# Patient Record
Sex: Female | Born: 1950 | Race: White | Hispanic: No | Marital: Single | Smoking: Former smoker
Health system: Southern US, Community
[De-identification: ages and names within clinical notes are randomized; demographics above are authoritative.]

## PROBLEM LIST (undated history)

## (undated) DIAGNOSIS — E785 Hyperlipidemia, unspecified: Secondary | ICD-10-CM

## (undated) DIAGNOSIS — Z8711 Personal history of peptic ulcer disease: Secondary | ICD-10-CM

## (undated) DIAGNOSIS — E0590 Thyrotoxicosis, unspecified without thyrotoxic crisis or storm: Secondary | ICD-10-CM

## (undated) DIAGNOSIS — K648 Other hemorrhoids: Secondary | ICD-10-CM

## (undated) DIAGNOSIS — H9192 Unspecified hearing loss, left ear: Secondary | ICD-10-CM

## (undated) DIAGNOSIS — R252 Cramp and spasm: Secondary | ICD-10-CM

## (undated) DIAGNOSIS — F32A Depression, unspecified: Secondary | ICD-10-CM

## (undated) DIAGNOSIS — J42 Unspecified chronic bronchitis: Secondary | ICD-10-CM

## (undated) DIAGNOSIS — Z9109 Other allergy status, other than to drugs and biological substances: Secondary | ICD-10-CM

## (undated) DIAGNOSIS — K759 Inflammatory liver disease, unspecified: Secondary | ICD-10-CM

## (undated) DIAGNOSIS — E7800 Pure hypercholesterolemia, unspecified: Secondary | ICD-10-CM

## (undated) DIAGNOSIS — Z8719 Personal history of other diseases of the digestive system: Secondary | ICD-10-CM

## (undated) DIAGNOSIS — K432 Incisional hernia without obstruction or gangrene: Secondary | ICD-10-CM

## (undated) DIAGNOSIS — M8580 Other specified disorders of bone density and structure, unspecified site: Secondary | ICD-10-CM

## (undated) DIAGNOSIS — K219 Gastro-esophageal reflux disease without esophagitis: Secondary | ICD-10-CM

## (undated) DIAGNOSIS — IMO0002 Reserved for concepts with insufficient information to code with codable children: Secondary | ICD-10-CM

## (undated) DIAGNOSIS — Z8619 Personal history of other infectious and parasitic diseases: Secondary | ICD-10-CM

## (undated) DIAGNOSIS — F988 Other specified behavioral and emotional disorders with onset usually occurring in childhood and adolescence: Secondary | ICD-10-CM

## (undated) DIAGNOSIS — N393 Stress incontinence (female) (male): Secondary | ICD-10-CM

## (undated) DIAGNOSIS — F329 Major depressive disorder, single episode, unspecified: Secondary | ICD-10-CM

## (undated) DIAGNOSIS — M1990 Unspecified osteoarthritis, unspecified site: Secondary | ICD-10-CM

## (undated) DIAGNOSIS — J45998 Other asthma: Secondary | ICD-10-CM

## (undated) HISTORY — DX: Other allergy status, other than to drugs and biological substances: Z91.09

## (undated) HISTORY — DX: Incisional hernia without obstruction or gangrene: K43.2

## (undated) HISTORY — DX: Hyperlipidemia, unspecified: E78.5

## (undated) HISTORY — PX: CHALAZION EXCISION: SHX213

## (undated) HISTORY — DX: Gastro-esophageal reflux disease without esophagitis: K21.9

## (undated) HISTORY — DX: Other specified disorders of bone density and structure, unspecified site: M85.80

## (undated) HISTORY — PX: TONSILLECTOMY: SUR1361

## (undated) HISTORY — DX: Major depressive disorder, single episode, unspecified: F32.9

## (undated) HISTORY — DX: Stress incontinence (female) (male): N39.3

## (undated) HISTORY — DX: Other hemorrhoids: K64.8

## (undated) HISTORY — PX: DILATION AND CURETTAGE OF UTERUS: SHX78

## (undated) HISTORY — DX: Reserved for concepts with insufficient information to code with codable children: IMO0002

## (undated) HISTORY — DX: Depression, unspecified: F32.A

## (undated) HISTORY — DX: Other specified behavioral and emotional disorders with onset usually occurring in childhood and adolescence: F98.8

## (undated) HISTORY — PX: EYE SURGERY: SHX253

---

## 1983-01-31 HISTORY — PX: REPAIR OF PERFORATED ULCER: SHX6065

## 1992-01-31 DIAGNOSIS — E0590 Thyrotoxicosis, unspecified without thyrotoxic crisis or storm: Secondary | ICD-10-CM

## 1992-01-31 HISTORY — DX: Thyrotoxicosis, unspecified without thyrotoxic crisis or storm: E05.90

## 2003-01-31 HISTORY — PX: INCONTINENCE SURGERY: SHX676

## 2009-10-30 LAB — HM PAP SMEAR: HM Pap smear: NORMAL

## 2011-01-11 LAB — HM MAMMOGRAPHY: HM Mammogram: NEGATIVE

## 2011-01-18 ENCOUNTER — Other Ambulatory Visit (INDEPENDENT_AMBULATORY_CARE_PROVIDER_SITE_OTHER): Payer: PRIVATE HEALTH INSURANCE

## 2011-01-18 ENCOUNTER — Ambulatory Visit (INDEPENDENT_AMBULATORY_CARE_PROVIDER_SITE_OTHER): Payer: PRIVATE HEALTH INSURANCE | Admitting: Internal Medicine

## 2011-01-18 ENCOUNTER — Encounter: Payer: Self-pay | Admitting: Internal Medicine

## 2011-01-18 DIAGNOSIS — E785 Hyperlipidemia, unspecified: Secondary | ICD-10-CM

## 2011-01-18 DIAGNOSIS — Z124 Encounter for screening for malignant neoplasm of cervix: Secondary | ICD-10-CM

## 2011-01-18 DIAGNOSIS — F329 Major depressive disorder, single episode, unspecified: Secondary | ICD-10-CM | POA: Insufficient documentation

## 2011-01-18 DIAGNOSIS — K432 Incisional hernia without obstruction or gangrene: Secondary | ICD-10-CM | POA: Insufficient documentation

## 2011-01-18 DIAGNOSIS — H9190 Unspecified hearing loss, unspecified ear: Secondary | ICD-10-CM

## 2011-01-18 DIAGNOSIS — F32A Depression, unspecified: Secondary | ICD-10-CM | POA: Insufficient documentation

## 2011-01-18 DIAGNOSIS — Z79899 Other long term (current) drug therapy: Secondary | ICD-10-CM

## 2011-01-18 DIAGNOSIS — N393 Stress incontinence (female) (male): Secondary | ICD-10-CM

## 2011-01-18 DIAGNOSIS — F988 Other specified behavioral and emotional disorders with onset usually occurring in childhood and adolescence: Secondary | ICD-10-CM | POA: Insufficient documentation

## 2011-01-18 DIAGNOSIS — M8580 Other specified disorders of bone density and structure, unspecified site: Secondary | ICD-10-CM | POA: Insufficient documentation

## 2011-01-18 LAB — HEPATIC FUNCTION PANEL
ALT: 21 U/L (ref 0–35)
Albumin: 4.5 g/dL (ref 3.5–5.2)
Total Protein: 7.3 g/dL (ref 6.0–8.3)

## 2011-01-18 LAB — LIPID PANEL
Cholesterol: 188 mg/dL (ref 0–200)
HDL: 73.9 mg/dL (ref >39.00)
Triglycerides: 56 mg/dL (ref 0.0–149.0)
VLDL: 11.2 mg/dL (ref 0.0–40.0)

## 2011-01-18 MED ORDER — METHYLPHENIDATE HCL 20 MG PO TABS: 20.0000 mg | ORAL_TABLET | Freq: Three times a day (TID) | ORAL | Status: DC

## 2011-01-18 MED ORDER — SIMVASTATIN 40 MG PO TABS: 40.0000 mg | ORAL_TABLET | Freq: Every day | ORAL | Status: DC

## 2011-01-18 NOTE — Patient Instructions (Signed)
It was good to see you today. We have reviewed your available prior records from [place] today - copies done for our EMR Test(s) ordered today. Your results will be called to you after review (48-72hours after test completion). If any changes need to be made, you will be notified at that time. Medications reviewed, no changes at this time. Refill on medication(s) as discussed today.  we will send to your prior provider(s) for "release of records" as discussed today -  we'll make referral to gynecology and for hearing evaluation. Our office will contact you regarding appointment(s) once made. Please schedule followup in 6 months for cholesterol check, call sooner if problems.

## 2011-01-18 NOTE — Assessment & Plan Note (Signed)
Overlapping depression and anxiety symptoms. Previously on multiple SSRIs or SNRI therapy including Cymbalta, Paxil and/or Prozac Stop Cymbalta spring 2012 and wishes not to resume if necessary. Patient to monitor for symptoms of recurrence

## 2011-01-18 NOTE — Assessment & Plan Note (Signed)
Chronic history of same, notes from [place] psychiatrist reviewed [place] for a patient today. Overlapping depression and anxiety symptoms, previously on SSRI for now maintained on Ritalin 3 times a day only Expansive mood evident today Denies increase in depression symptoms but we'll monitor, especially with stress of move and winter season. Refills provided today as requested

## 2011-01-18 NOTE — Assessment & Plan Note (Signed)
Patient previously on a weekly bisphosphonate therapy for 4 years until fall 2011 when DEXA showed improvement in bone density. Now normal. Will send for records from [place] provider. Maintained on over-the-counter calcium supplements and weightbearing exercises as tolerated

## 2011-01-18 NOTE — Assessment & Plan Note (Signed)
On statin therapy since 2007, denies secondary disease such as peripheral vascular disease, heart disease or stroke Tolerating medication without adverse side effects. Check baseline lipids and LFTs, sent for prior report from PCP and planned to continue same Refills provided today as requested

## 2011-01-18 NOTE — Progress Notes (Signed)
Subjective:    Patient ID: [name], female    DOB: Jul 05, 1950, 60 y.o.   MRN: [id number]  HPI New patient to me and our practice today, here to establish care.  Reviewed chronic medical issues today.  ADD. Long history of same and was followed in [place] by psychiatry for same. Previously on Ritalin, prefers extended release. Overlap with depression and anxiety symptoms, prior SSRI treatment and/or Cymbalta with variable efficacy. Off Cymbalta since spring 2012 and does not wish to resume same but would like refill on Ritalin  Dyslipidemia. Chronically on statin since 2007. Denies subsequent vascular disease related to uncontrolled lipids. No adverse side effects related to treatment. Request check for lipid and LFTs today  Chronic urinary incontinence. Describes stress and urge symptoms. Prior sling in 2004 with transient improvement in symptoms but would like reevaluation by urogynecology for same  Past Medical History  Diagnosis Date  . Dyslipidemia   . Depression   . Osteopenia   . Incisional hernia   . Stress incontinence, female   . Asthma   . ADD (attention deficit disorder)    Family History  Problem Relation Age of Onset  . Arthritis Mother   . Heart disease Mother     CABG @ 20  . Diabetes Mother   . Osteoporosis Mother   . Arthritis Father   . Lymphoma Father 8    chemo  . Lymphoma Paternal Grandmother   . Diabetes Maternal Grandmother    History  Substance Use Topics  . Smoking status: Never Smoker   . Smokeless tobacco: Not on file  . Alcohol Use: Yes     social   Review of Systems Constitutional: Negative for fever or weight change.  ENT: reports bilaterally decreased hearing, left greater than right with mild tinnitus -ongoing greater than 6 months  Respiratory: Negative for cough and shortness of breath.   Cardiovascular: Negative for chest pain or palpitations.  Gastrointestinal: Negative for abdominal pain, no bowel changes.  Musculoskeletal:  Negative for gait problem or joint swelling.  Skin: Negative for rash.  Neurological: Negative for dizziness or headache.  No other specific complaints in a complete review of systems (except as listed in HPI above).     Objective:   Physical Exam BP 112/70  Pulse 74  Temp(Src) 98.6 F (37 C) (Oral)  Ht 5' 2.25" (1.581 m)  Wt 165 lb 12.8 oz (75.206 kg)  BMI 30.08 kg/m2  SpO2 97% Wt Readings from Last 3 Encounters:  01/18/11 165 lb 12.8 oz (75.206 kg)   Constitutional: She appears well-developed and well-nourished. No distress.  HENT: Head: Normocephalic and atraumatic. Ears: B TMs ok, no erythema or effusion; Nose: Nose normal.  Mouth/Throat: Oropharynx is clear and moist. No oropharyngeal exudate.  Eyes: Conjunctivae and EOM are normal. Pupils are equal, round, and reactive to light. No scleral icterus.  Neck: Normal range of motion. Neck supple. No JVD present. No thyromegaly present.  Cardiovascular: Normal rate, regular rhythm and normal heart sounds.  No murmur heard. No BLE edema. Pulmonary/Chest: Effort normal and breath sounds normal. No respiratory distress. She has no wheezes.  Abdominal: Soft. Bowel sounds are normal. She exhibits no distension. There is no tenderness.  well-healed midline incision of her abdomen with slight incisional hernia to right side of scar  Musculoskeletal: Normal range of motion, no joint effusions. No gross deformities Neurological: She is alert and oriented to person, place, and time. No cranial nerve deficit. Coordination normal.  Skin: Skin is warm  and dry. No rash noted. No erythema.  Psychiatric: She has an expansive mood and affect. Pressured speech with tangential thought. Requiring frequent redirection  Lab Results  Component Value Date   CHOL 188 01/18/2011   TRIG 56.0 01/18/2011   HDL 73.90 01/18/2011   LDLCALC 103* 01/18/2011   ALT 21 01/18/2011   AST 23 01/18/2011       Assessment & Plan:  See problem list. Medications and  labs reviewed today.  Bilateral hearing loss, mild tinnitus. Refer to audiology. ENT exam today otherwise benign  Urinary incontinence, mixed. Prior history of urogynecologic repair. Refer back to GYN for Pap/pelvic and evaluation of surgical needs

## 2011-01-18 NOTE — Assessment & Plan Note (Signed)
Prior sling repair for same symptoms 2004, Given recurrence, referred to GYN to evaluate this and Pap/pelvic given atrophy symptoms with post coital bleeding. Discussed topical Premarin or estrogen therapy which patient declined 

## 2011-01-23 ENCOUNTER — Ambulatory Visit: Payer: Self-pay | Admitting: Internal Medicine

## 2011-02-06 ENCOUNTER — Telehealth: Payer: Self-pay | Admitting: Internal Medicine

## 2011-02-06 NOTE — Telephone Encounter (Signed)
The pt called and is requesting a same day apt (she called at 2:22pm) for flu symptoms.  Please advise if she can be double booked for today.  She was informed of openings for tomorrow, but the pt has requested for same day.    Thanks!

## 2011-02-06 NOTE — Telephone Encounter (Signed)
Obviously, I am unable to accommodate her request for same day today (it is now 6:18 PM) - but I will see her tomorrow if the need arises.  I will also be happy to take one additional "acute only" add on each a.m. and each p.m. -  In general, if there are any cancellations of routinely scheduled followup during this cold and flu season, please leave these slots open for same day visits now through March - or until further notice. Please share my preference with the other schedulers

## 2011-02-07 NOTE — Telephone Encounter (Signed)
Called patient back at 8:21am (02/07/11).  The person who answered the phone stated she was not home and would give her the message to call back as soon as possible.

## 2011-03-20 ENCOUNTER — Telehealth: Payer: Self-pay | Admitting: *Deleted

## 2011-03-20 MED ORDER — METHYLPHENIDATE HCL 20 MG PO TABS: 20.0000 mg | ORAL_TABLET | Freq: Three times a day (TID) | ORAL | Status: DC

## 2011-03-20 NOTE — Telephone Encounter (Signed)
Will refer to ENT if felt to be appropriate by audiology evaluation, should complete audiology hearing evaluation first - refill done

## 2011-03-20 NOTE — Telephone Encounter (Signed)
Left msg on vm requesting refill on Ritalin & requesting referral to see ENT instead of audiologist... 03/20/11@1 :55pm/LMB

## 2011-03-21 NOTE — Telephone Encounter (Signed)
Notified pt with md response. Put rx up front for pick-up... 03/21/11@9 :14am/LMB

## 2011-03-29 ENCOUNTER — Encounter: Payer: Self-pay | Admitting: Obstetrics & Gynecology

## 2011-03-29 ENCOUNTER — Ambulatory Visit (INDEPENDENT_AMBULATORY_CARE_PROVIDER_SITE_OTHER): Payer: PRIVATE HEALTH INSURANCE | Admitting: Obstetrics & Gynecology

## 2011-03-29 VITALS — BP 118/79 | HR 78 | Temp 97.8°F | Resp 20 | Ht 61.5 in | Wt 167.0 lb

## 2011-03-29 DIAGNOSIS — N898 Other specified noninflammatory disorders of vagina: Secondary | ICD-10-CM

## 2011-03-29 DIAGNOSIS — Z01419 Encounter for gynecological examination (general) (routine) without abnormal findings: Secondary | ICD-10-CM

## 2011-03-29 NOTE — Patient Instructions (Signed)
Continue yearly pap and mammogram. Get your vitamin D at least 800 mU daily along with 1000 mg calcium daily. If you have any vaginal bleeding in the future, get checked to rule out cancer. You will be called if lab tests are abnormal.  Health Maintenance, Females A healthy lifestyle and preventative care can promote health and wellness.  Maintain regular health, dental, and eye exams.   Eat a healthy diet. Foods like vegetables, fruits, whole grains, low-fat dairy products, and lean protein foods contain the nutrients you need without too many calories. Decrease your intake of foods high in solid fats, added sugars, and salt. Get information about a proper diet from your caregiver, if necessary.   Regular physical exercise is one of the most important things you can do for your health. Most adults should get at least 150 minutes of moderate-intensity exercise (any activity that increases your heart rate and causes you to sweat) each week. In addition, most adults need muscle-strengthening exercises on 2 or more days a week.    Maintain a healthy weight. The body mass index (BMI) is a screening tool to identify possible weight problems. It provides an estimate of body fat based on height and weight. Your caregiver can help determine your BMI, and can help you achieve or maintain a healthy weight. For adults 20 years and older:   A BMI below 18.5 is considered underweight.   A BMI of 18.5 to 24.9 is normal.   A BMI of 25 to 29.9 is considered overweight.   A BMI of 30 and above is considered obese.   Maintain normal blood lipids and cholesterol by exercising and minimizing your intake of saturated fat. Eat a balanced diet with plenty of fruits and vegetables. Blood tests for lipids and cholesterol should begin at age 25 and be repeated every 5 years. If your lipid or cholesterol levels are high, you are over 50, or you are a high risk for heart disease, you may need your cholesterol levels  checked more frequently.Ongoing high lipid and cholesterol levels should be treated with medicines if diet and exercise are not effective.   If you smoke, find out from your caregiver how to quit. If you do not use tobacco, do not start.   If you are pregnant, do not drink alcohol. If you are breastfeeding, be very cautious about drinking alcohol. If you are not pregnant and choose to drink alcohol, do not exceed 1 drink per day. One drink is considered to be 12 ounces (355 mL) of beer, 5 ounces (148 mL) of wine, or 1.5 ounces (44 mL) of liquor.   Avoid use of street drugs. Do not share needles with anyone. Ask for help if you need support or instructions about stopping the use of drugs.   High blood pressure causes heart disease and increases the risk of stroke. Blood pressure should be checked at least every 1 to 2 years. Ongoing high blood pressure should be treated with medicines, if weight loss and exercise are not effective.   If you are 56 to 61 years old, ask your caregiver if you should take aspirin to prevent strokes.   Diabetes screening involves taking a blood sample to check your fasting blood sugar level. This should be done once every 3 years, after age 50, if you are within normal weight and without risk factors for diabetes. Testing should be considered at a younger age or be carried out more frequently if you are overweight and have  at least 1 risk factor for diabetes.   Breast cancer screening is essential preventative care for women. You should practice "breast self-awareness." This means understanding the normal appearance and feel of your breasts and may include breast self-examination. Any changes detected, no matter how small, should be reported to a caregiver. Women in their 28s and 30s should have a clinical breast exam (CBE) by a caregiver as part of a regular health exam every 1 to 3 years. After age 33, women should have a CBE every year. Starting at age 28, women should  consider having a mammogram (breast X-ray) every year. Women who have a family history of breast cancer should talk to their caregiver about genetic screening. Women at a high risk of breast cancer should talk to their caregiver about having an MRI and a mammogram every year.   The Pap test is a screening test for cervical cancer. Women should have a Pap test starting at age 4. Between ages 16 and 62, Pap tests should be repeated every 2 years. Beginning at age 54, you should have a Pap test every 3 years as long as the past 3 Pap tests have been normal. If you had a hysterectomy for a problem that was not cancer or a condition that could lead to cancer, then you no longer need Pap tests. If you are between ages 26 and 41, and you have had normal Pap tests going back 10 years, you no longer need Pap tests. If you have had past treatment for cervical cancer or a condition that could lead to cancer, you need Pap tests and screening for cancer for at least 20 years after your treatment. If Pap tests have been discontinued, risk factors (such as a new sexual partner) need to be reassessed to determine if screening should be resumed. Some women have medical problems that increase the chance of getting cervical cancer. In these cases, your caregiver may recommend more frequent screening and Pap tests.   The human papillomavirus (HPV) test is an additional test that may be used for cervical cancer screening. The HPV test looks for the virus that can cause the cell changes on the cervix. The cells collected during the Pap test can be tested for HPV. The HPV test could be used to screen women aged 25 years and older, and should be used in women of any age who have unclear Pap test results. After the age of 42, women should have HPV testing at the same frequency as a Pap test.   Colorectal cancer can be detected and often prevented. Most routine colorectal cancer screening begins at the age of 38 and continues through  age 17. However, your caregiver may recommend screening at an earlier age if you have risk factors for colon cancer. On a yearly basis, your caregiver may provide home test kits to check for hidden blood in the stool. Use of a small camera at the end of a tube, to directly examine the colon (sigmoidoscopy or colonoscopy), can detect the earliest forms of colorectal cancer. Talk to your caregiver about this at age 39, when routine screening begins. Direct examination of the colon should be repeated every 5 to 10 years through age 22, unless early forms of pre-cancerous polyps or small growths are found.   Practice safe sex. Use condoms and avoid high-risk sexual practices to reduce the spread of sexually transmitted infections (STIs). Sexually active women aged 69 and younger should be checked for Chlamydia, which is a  common sexually transmitted infection. Older women with new or multiple partners should also be tested for Chlamydia. Testing for other STIs is recommended if you are sexually active and at increased risk.   Osteoporosis is a disease in which the bones lose minerals and strength with aging. This can result in serious bone fractures. The risk of osteoporosis can be identified using a bone density scan. Women ages 55 and over and women at risk for fractures or osteoporosis should discuss screening with their caregivers. Ask your caregiver whether you should be taking a calcium supplement or vitamin D to reduce the rate of osteoporosis.   Menopause can be associated with physical symptoms and risks. Hormone replacement therapy is available to decrease symptoms and risks. You should talk to your caregiver about whether hormone replacement therapy is right for you.   Use sunscreen with a sun protection factor (SPF) of 30 or greater. Apply sunscreen liberally and repeatedly throughout the day. You should seek shade when your shadow is shorter than you. Protect yourself by wearing long sleeves, pants,  a wide-brimmed hat, and sunglasses year round, whenever you are outdoors.   Notify your caregiver of new moles or changes in moles, especially if there is a change in shape or color. Also notify your caregiver if a mole is larger than the size of a pencil eraser.   Stay current with your immunizations.  Document Released: 08/01/2010 Document Revised: 09/28/2010 Document Reviewed: 08/01/2010 [facility] Patient Information 2012 [place], [place].

## 2011-03-29 NOTE — Progress Notes (Signed)
  Subjective:    [name] is a 61 y.o. female who presents for an annual exam. The patient has complaints of vaginal dryness. The patient is sexually active after 10 years of abstinence. Using lubricant and is satisfied with results. GYN screening history: last pap: was normal and last mammogram: approximate date 12/12 and was normal. The patient wears seatbelts: yes. The patient participates in regular exercise: not asked. Has the patient ever been transfused or tattooed?: no. The patient reports that there is not domestic violence in her life.   Menstrual History: OB History    Grav Para Term Preterm Abortions TAB SAB Ect Mult Living   5 1  1 4 2 2   1       Patient's last menstrual period was 5 years ago. She had 2 years amenorrhea, then an isolated menstrual period, but now amenorrhea for 5 years. Had some post-coital spotting and vaginal dryness with recent sexual activity. Always uses condoms, hx of herpes.    The following portions of the patient's history were reviewed and updated as appropriate: allergies, current medications, past family history, past medical history, past social history, past surgical history and problem list.  Review of Systems Pertinent items are noted in HPI.    Objective:    BP 118/79  Pulse 78  Temp(Src) 97.8 F (36.6 C) (Oral)  Resp 20  Ht 5' 1.5" (1.562 m)  Wt 167 lb (75.751 kg)  BMI 31.04 kg/m2  LMP 08/30/2008  General Appearance:    Alert, cooperative, no distress, appears stated age  Head:    Normocephalic, without obvious abnormality, atraumatic  Eyes:    PERRL, conjunctiva/corneas clear, EOM's intact     Nose:   Nares normal, septum midline, mucosa normal, no drainage    or sinus tenderness  Throat:   Lips, mucosa, and tongue normal; teeth and gums normal  Neck:   Supple, symmetrical, trachea midline, no adenopathy;    thyroid:  no enlargement/tenderness/nodules;     Lungs:     Clear to auscultation bilaterally, respirations unlabored    Chest Wall:    No tenderness or deformity   Heart:    Regular rate and rhythm, S1 and S2 normal, no murmur, rub   or gallop  Breast Exam:    No tenderness, masses, or nipple abnormality  Abdomen:     Soft, non-tender, bowel sounds active all four quadrants,    no masses, no organomegaly  Genitalia:    Normal female without lesion, discharge. TTP with insertion of speculum. No cervical motion tenderness, uterus palpated wnl.   Rectal:    Normal tone, normal prostate, no masses or tenderness;   guaiac negative stool  Extremities:   Extremities normal, atraumatic, no cyanosis or edema  Pulses:   2+ and symmetric all extremities  Skin:   Skin color, texture, turgor normal, no rashes or lesions  Lymph nodes:   Cervical, supraclavicular, and axillary nodes normal  Neurologic:   CNII-XII intact, normal strength, sensation and reflexes    throughout  .    Assessment:    Healthy female exam.  Vaginal atrophy/dryness.   Plan:     Follow up in 1 year.  Patient not interested in estrogen therapy for atrophy. Continue lubricants prn. Discussed close f/u if vaginal bleeding returns. PAP and screening GC/ch today.

## 2011-03-30 LAB — WET PREP, GENITAL: Yeast Wet Prep HPF POC: NONE SEEN

## 2011-05-19 ENCOUNTER — Other Ambulatory Visit: Payer: Self-pay | Admitting: *Deleted

## 2011-05-19 MED ORDER — METHYLPHENIDATE HCL 20 MG PO TABS: 20.0000 mg | ORAL_TABLET | Freq: Three times a day (TID) | ORAL | Status: DC

## 2011-05-19 NOTE — Telephone Encounter (Signed)
Left msg on vm needing refill on ritalin. MD is out of office is this ok to refill?... 05/19/11@3 :38pm/LMB

## 2011-05-19 NOTE — Telephone Encounter (Signed)
Notified pt rx ready for pick-up.. 05/19/11@4 ;22pm/LMB

## 2011-05-19 NOTE — Telephone Encounter (Signed)
Done hardcopy to lucy 

## 2011-07-11 ENCOUNTER — Ambulatory Visit: Payer: PRIVATE HEALTH INSURANCE | Admitting: Internal Medicine

## 2011-07-17 ENCOUNTER — Other Ambulatory Visit: Payer: Self-pay | Admitting: *Deleted

## 2011-07-17 NOTE — Telephone Encounter (Signed)
ok 

## 2011-07-17 NOTE — Telephone Encounter (Signed)
Pt is requesting Rf on Ritalin 20 mg to be p/u today or tom. She also requests a call back from [place].

## 2011-07-18 MED ORDER — SIMVASTATIN 40 MG PO TABS: 40.0000 mg | ORAL_TABLET | Freq: Every day | ORAL | Status: DC

## 2011-07-18 MED ORDER — METHYLPHENIDATE HCL 20 MG PO TABS: 20.0000 mg | ORAL_TABLET | Freq: Three times a day (TID) | ORAL | Status: DC

## 2011-07-18 NOTE — Telephone Encounter (Signed)
Ok to refill simva No need to check cholesterol now or q77mo unless dose change (none rx'd) - will check again at [facility] in Dec - thanks

## 2011-07-18 NOTE — Telephone Encounter (Signed)
Called pt no answer LMOM md response sent refills to [facility]... 07/18/11@1 :51pm/LMB

## 2011-07-18 NOTE — Telephone Encounter (Signed)
Notified pt rx ready for pick-up. Also pt wanted to let md know she cx her 6 month f/u. Her insurance only allows hrt to see md 3 x's a year & have already use 2. Just want to come in to have cholesterol check because she is needing refills on her simvastatin.... 07/18/11@9L00am /LMB

## 2011-07-21 ENCOUNTER — Ambulatory Visit: Payer: PRIVATE HEALTH INSURANCE | Admitting: Internal Medicine

## 2011-07-26 ENCOUNTER — Ambulatory Visit: Payer: PRIVATE HEALTH INSURANCE | Admitting: Internal Medicine

## 2011-11-03 ENCOUNTER — Other Ambulatory Visit: Payer: Self-pay | Admitting: Internal Medicine

## 2011-12-22 ENCOUNTER — Ambulatory Visit (INDEPENDENT_AMBULATORY_CARE_PROVIDER_SITE_OTHER): Payer: PRIVATE HEALTH INSURANCE | Admitting: Internal Medicine

## 2011-12-22 ENCOUNTER — Other Ambulatory Visit (INDEPENDENT_AMBULATORY_CARE_PROVIDER_SITE_OTHER): Payer: PRIVATE HEALTH INSURANCE

## 2011-12-22 ENCOUNTER — Encounter: Payer: Self-pay | Admitting: Internal Medicine

## 2011-12-22 VITALS — BP 102/70 | HR 109 | Temp 100.6°F | Ht 61.5 in | Wt 172.8 lb

## 2011-12-22 DIAGNOSIS — E785 Hyperlipidemia, unspecified: Secondary | ICD-10-CM

## 2011-12-22 DIAGNOSIS — M949 Disorder of cartilage, unspecified: Secondary | ICD-10-CM

## 2011-12-22 DIAGNOSIS — M899 Disorder of bone, unspecified: Secondary | ICD-10-CM

## 2011-12-22 DIAGNOSIS — J019 Acute sinusitis, unspecified: Secondary | ICD-10-CM

## 2011-12-22 DIAGNOSIS — Z0000 Encounter for general adult medical examination without abnormal findings: Secondary | ICD-10-CM

## 2011-12-22 DIAGNOSIS — M8580 Other specified disorders of bone density and structure, unspecified site: Secondary | ICD-10-CM

## 2011-12-22 DIAGNOSIS — F988 Other specified behavioral and emotional disorders with onset usually occurring in childhood and adolescence: Secondary | ICD-10-CM

## 2011-12-22 LAB — CBC WITH DIFFERENTIAL/PLATELET
Basophils Absolute: 0 10*3/uL (ref 0.0–0.1)
Eosinophils Relative: 2.1 % (ref 0.0–5.0)
HCT: 42.8 % (ref 36.0–46.0)
Lymphs Abs: 1.3 10*3/uL (ref 0.7–4.0)
MCV: 91.8 fl (ref 78.0–100.0)
Monocytes Absolute: 0.7 10*3/uL (ref 0.1–1.0)
Neutro Abs: 6.2 10*3/uL (ref 1.4–7.7)
Platelets: 272 10*3/uL (ref 150.0–400.0)
RDW: 13.7 % (ref 11.5–14.6)

## 2011-12-22 LAB — BASIC METABOLIC PANEL
BUN: 11 mg/dL (ref 6–23)
CO2: 29 mEq/L (ref 19–32)
Chloride: 103 mEq/L (ref 96–112)
Glucose, Bld: 102 mg/dL — ABNORMAL HIGH (ref 70–99)
Potassium: 5.5 mEq/L — ABNORMAL HIGH (ref 3.5–5.1)

## 2011-12-22 LAB — LIPID PANEL
LDL Cholesterol: 92 mg/dL (ref 0–99)
Total CHOL/HDL Ratio: 3
VLDL: 15.8 mg/dL (ref 0.0–40.0)

## 2011-12-22 LAB — HEPATIC FUNCTION PANEL
Bilirubin, Direct: 0.2 mg/dL (ref 0.0–0.3)
Total Bilirubin: 0.6 mg/dL (ref 0.3–1.2)

## 2011-12-22 LAB — TSH: TSH: 1.64 u[IU]/mL (ref 0.35–5.50)

## 2011-12-22 MED ORDER — LEVOFLOXACIN 500 MG PO TABS: 500.0000 mg | ORAL_TABLET | Freq: Every day | ORAL | Status: DC

## 2011-12-22 MED ORDER — HYDROCOD POLST-CHLORPHEN POLST 10-8 MG/5ML PO LQCR: 5.0000 mL | Freq: Two times a day (BID) | ORAL | Status: DC | PRN

## 2011-12-22 MED ORDER — HYDROCODONE-HOMATROPINE 5-1.5 MG/5ML PO SYRP: 5.0000 mL | ORAL_SOLUTION | Freq: Three times a day (TID) | ORAL | Status: DC | PRN

## 2011-12-22 MED ORDER — METHYLPHENIDATE HCL 20 MG PO TABS: 20.0000 mg | ORAL_TABLET | Freq: Three times a day (TID) | ORAL | Status: DC

## 2011-12-22 NOTE — Assessment & Plan Note (Addendum)
Patient previously on a weekly bisphosphonate therapy for 4 years until fall 2011 when DEXA showed improvement in bone density. (normal)  Maintained on over-the-counter calcium supplements and weightbearing exercises as tolerated Refer for new dexa approx 09/2012 -

## 2011-12-22 NOTE — Patient Instructions (Addendum)
It was good to see you today. We have reviewed your prior records including labs and tests today Health Maintenance reviewed - all recommended immunizations and age-appropriate screenings are up-to-date. Test(s) ordered today. Your results will be released to MyChart (or called to you) after review, usually within 72hours after test completion. If any changes need to be made, you will be notified at that same time. Levaquin antibiotics and Tussionex syrup - Your prescription(s) have been submitted to your pharmacy. Please take as directed and contact our office if you believe you are having problem(s) with the medication(s). Medications reviewed and updated, no other changes at this time. Please schedule followup in 10 months for medical physical and labs, call sooner if problems.Health Maintenance, Females A healthy lifestyle and preventative care can promote health and wellness.  Maintain regular health, dental, and eye exams.   Eat a healthy diet. Foods like vegetables, fruits, whole grains, low-fat dairy products, and lean protein foods contain the nutrients you need without too many calories. Decrease your intake of foods high in solid fats, added sugars, and salt. Get information about a proper diet from your caregiver, if necessary.   Regular physical exercise is one of the most important things you can do for your health. Most adults should get at least 150 minutes of moderate-intensity exercise (any activity that increases your heart rate and causes you to sweat) each week. In addition, most adults need muscle-strengthening exercises on 2 or more days a week.     Maintain a healthy weight. The body mass index (BMI) is a screening tool to identify possible weight problems. It provides an estimate of body fat based on height and weight. Your caregiver can help determine your BMI, and can help you achieve or maintain a healthy weight. For adults 20 years and older:   A BMI below 18.5 is  considered underweight.   A BMI of 18.5 to 24.9 is normal.   A BMI of 25 to 29.9 is considered overweight.   A BMI of 30 and above is considered obese.   Maintain normal blood lipids and cholesterol by exercising and minimizing your intake of saturated fat. Eat a balanced diet with plenty of fruits and vegetables. Blood tests for lipids and cholesterol should begin at age 55 and be repeated every 5 years. If your lipid or cholesterol levels are high, you are over 50, or you are a high risk for heart disease, you may need your cholesterol levels checked more frequently. Ongoing high lipid and cholesterol levels should be treated with medicines if diet and exercise are not effective.   If you smoke, find out from your caregiver how to quit. If you do not use tobacco, do not start.   If you are pregnant, do not drink alcohol. If you are breastfeeding, be very cautious about drinking alcohol. If you are not pregnant and choose to drink alcohol, do not exceed 1 drink per day. One drink is considered to be 12 ounces (355 mL) of beer, 5 ounces (148 mL) of wine, or 1.5 ounces (44 mL) of liquor.   Avoid use of street drugs. Do not share needles with anyone. Ask for help if you need support or instructions about stopping the use of drugs.   High blood pressure causes heart disease and increases the risk of stroke. Blood pressure should be checked at least every 1 to 2 years. Ongoing high blood pressure should be treated with medicines, if weight loss and exercise are not effective.  If you are 71 to 61 years old, ask your caregiver if you should take aspirin to prevent strokes.   Diabetes screening involves taking a blood sample to check your fasting blood sugar level. This should be done once every 3 years, after age 74, if you are within normal weight and without risk factors for diabetes. Testing should be considered at a younger age or be carried out more frequently if you are overweight and have at  least 1 risk factor for diabetes.   Breast cancer screening is essential preventative care for women. You should practice "breast self-awareness." This means understanding the normal appearance and feel of your breasts and may include breast self-examination. Any changes detected, no matter how small, should be reported to a caregiver. Women in their 37s and 30s should have a clinical breast exam (CBE) by a caregiver as part of a regular health exam every 1 to 3 years. After age 69, women should have a CBE every year. Starting at age 54, women should consider having a mammogram (breast X-ray) every year. Women who have a family history of breast cancer should talk to their caregiver about genetic screening. Women at a high risk of breast cancer should talk to their caregiver about having an MRI and a mammogram every year.   The Pap test is a screening test for cervical cancer. Women should have a Pap test starting at age 50. Between ages 38 and 31, Pap tests should be repeated every 2 years. Beginning at age 78, you should have a Pap test every 3 years as long as the past 3 Pap tests have been normal. If you had a hysterectomy for a problem that was not cancer or a condition that could lead to cancer, then you no longer need Pap tests. If you are between ages 66 and 19, and you have had normal Pap tests going back 10 years, you no longer need Pap tests. If you have had past treatment for cervical cancer or a condition that could lead to cancer, you need Pap tests and screening for cancer for at least 20 years after your treatment. If Pap tests have been discontinued, risk factors (such as a new sexual partner) need to be reassessed to determine if screening should be resumed. Some women have medical problems that increase the chance of getting cervical cancer. In these cases, your caregiver may recommend more frequent screening and Pap tests.   The human papillomavirus (HPV) test is an additional test that may  be used for cervical cancer screening. The HPV test looks for the virus that can cause the cell changes on the cervix. The cells collected during the Pap test can be tested for HPV. The HPV test could be used to screen women aged 67 years and older, and should be used in women of any age who have unclear Pap test results. After the age of 31, women should have HPV testing at the same frequency as a Pap test.   Colorectal cancer can be detected and often prevented. Most routine colorectal cancer screening begins at the age of 86 and continues through age 14. However, your caregiver may recommend screening at an earlier age if you have risk factors for colon cancer. On a yearly basis, your caregiver may provide home test kits to check for hidden blood in the stool. Use of a small camera at the end of a tube, to directly examine the colon (sigmoidoscopy or colonoscopy), can detect the earliest forms of  colorectal cancer. Talk to your caregiver about this at age 26, when routine screening begins. Direct examination of the colon should be repeated every 5 to 10 years through age 48, unless early forms of pre-cancerous polyps or small growths are found.   Hepatitis C blood testing is recommended for all people born from [birth year] through 1965 and any individual with known risks for hepatitis C.   Practice safe sex. Use condoms and avoid high-risk sexual practices to reduce the spread of sexually transmitted infections (STIs). Sexually active women aged 27 and younger should be checked for Chlamydia, which is a common sexually transmitted infection. Older women with new or multiple partners should also be tested for Chlamydia. Testing for other STIs is recommended if you are sexually active and at increased risk.   Osteoporosis is a disease in which the bones lose minerals and strength with aging. This can result in serious bone fractures. The risk of osteoporosis can be identified using a bone density scan. Women ages  14 and over and women at risk for fractures or osteoporosis should discuss screening with their caregivers. Ask your caregiver whether you should be taking a calcium supplement or vitamin D to reduce the rate of osteoporosis.   Menopause can be associated with physical symptoms and risks. Hormone replacement therapy is available to decrease symptoms and risks. You should talk to your caregiver about whether hormone replacement therapy is right for you.   Use sunscreen with a sun protection factor (SPF) of 30 or greater. Apply sunscreen liberally and repeatedly throughout the day. You should seek shade when your shadow is shorter than you. Protect yourself by wearing long sleeves, pants, a wide-brimmed hat, and sunglasses year round, whenever you are outdoors.   Notify your caregiver of new moles or changes in moles, especially if there is a change in shape or color. Also notify your caregiver if a mole is larger than the size of a pencil eraser.   Stay current with your immunizations.  Document Released: 08/01/2010 Document Revised: 04/10/2011 Document Reviewed: 08/01/2010 [facility] Patient Information 2013 [place], [place].

## 2011-12-22 NOTE — Progress Notes (Signed)
Subjective:    Patient ID: [name], female    DOB: [birth year], 61 y.o.   MRN: [id number]  URI    patient is here today for annual physical. Patient feels well overall.  Complains of 72 hours sinus pressure, nasal discharge, sore throat and sinus headache -associated with fever, cough. Unresponsive to over-the-counter medication for symptom management.  Also reviewed chronic medical issues today.  ADD. Long history of same, previously followed in [place] by psychiatry, now following with Dr. Dub Mikes local psychiatrist. on Ritalin, previously extended release. Overlap with depression and anxiety symptoms, but prior SSRI / SNRI treatment with variable efficacy. Off Cymbalta since spring 2012 -currently on Wellbutrin but wishes to discontinue same. requests refill on Ritalin  Dyslipidemia. on statin since 2007. Denies CAD or vascular disease.  No adverse side effects related to treatment.   Chronic urinary incontinence. Describes stress and urge symptoms. Prior sling in 2004 with transient improvement in symptoms   Past Medical History  Diagnosis Date  . Dyslipidemia   . Depression   . Osteopenia   . Incisional hernia   . Stress incontinence, female   . Asthma   . ADD (attention deficit disorder)    Family History  Problem Relation Age of Onset  . Arthritis Mother   . Heart disease Mother     CABG @ 86  . Diabetes Mother   . Osteoporosis Mother   . Arthritis Father   . Lymphoma Father 26    chemo  . Cancer Father     lymphoma  . Lymphoma Paternal Grandmother   . Diabetes Maternal Grandmother    History  Substance Use Topics  . Smoking status: Never Smoker   . Smokeless tobacco: Not on file  . Alcohol Use: Yes     Comment: social   Review of Systems  Constitutional: Negative for weight change.  ENT: reports bilaterally decreased hearing, left greater than right with mild tinnitus, chronic  Respiratory: Negative for shortness of breath.   Cardiovascular: Negative for  chest pain or palpitations.  Gastrointestinal: Negative for abdominal pain, no bowel changes.  Musculoskeletal: Negative for gait problem or joint swelling.  Skin: Negative for rash.  Neurological: Negative for dizziness or headache.  No other specific complaints in a complete review of systems (except as listed in HPI above).     Objective:   Physical Exam  BP 102/70  Pulse 109  Temp 100.6 F (38.1 C) (Oral)  Ht 5' 1.5" (1.562 m)  Wt 172 lb 12.8 oz (78.382 kg)  BMI 32.12 kg/m2  SpO2 95%  LMP 08/30/2008 Wt Readings from Last 3 Encounters:  12/22/11 172 lb 12.8 oz (78.382 kg)  03/29/11 167 lb (75.751 kg)  01/18/11 165 lb 12.8 oz (75.206 kg)   Constitutional: She appears mildly ill, but appears well-developed and well-nourished.   HENT: Head: Normocephalic and atraumatic. Bilateral sinus tenderness bilaterally to palpation, maxillary greater than frontal. Ears: B TMs ok, no erythema -trace clear effusion; Nose: Nose with swollen turbinates and thick discolored nasal discharge. Mouth/Throat: Oropharynx is clear and moist. PND but no oropharyngeal exudate.  Eyes: Conjunctivae and EOM are normal. Pupils are equal, round, and reactive to light. No scleral icterus.  Neck: Normal range of motion. Neck supple. No JVD or LAD present. No thyromegaly present.  Cardiovascular: Normal rate, regular rhythm and normal heart sounds.  No murmur heard. No BLE edema. Pulmonary/Chest: Effort normal and breath sounds normal. No respiratory distress. She has no wheezes.  Abdominal: Soft. Bowel sounds  are normal. She exhibits no distension. There is no tenderness.  well-healed midline incision of her abdomen with slight incisional hernia to right side of scar  Musculoskeletal: Normal range of motion, no joint effusions. No gross deformities Neurological: She is alert and oriented to person, place, and time. No cranial nerve deficit. Coordination, speech and balance are normal.  Skin: Skin is warm and dry.  No rash noted. No erythema.  Psychiatric: She has an expansive mood and affect. Pressured speech with tangential thought. Requiring frequent redirection  Lab Results  Component Value Date   CHOL 188 01/18/2011   TRIG 56.0 01/18/2011   HDL 73.90 01/18/2011   LDLCALC 103* 01/18/2011   ALT 21 01/18/2011   AST 23 01/18/2011       Assessment & Plan:  CPX/v70.0- Patient has been counseled on age-appropriate routine health concerns for screening and prevention. These are reviewed and up-to-date. Immunizations are up-to-date or declined. Labs orderd and reviewed  Acute sinusitis - Levaquin and tussionex - OTC antihistamine and decongestant with tylenol prn - follow up ENT as needed if unimproved  Also see problem list. Medications and labs reviewed today.  Bilateral hearing loss, mild tinnitus. Advised follow up with audiology/ENT after resolution of acute infectious issues

## 2011-12-22 NOTE — Assessment & Plan Note (Addendum)
Chronic history of same, notes from [place] psychiatrist reviewed [place] for a patient today. Overlapping depression and anxiety symptoms, previously on SSRI for now maintained on Ritalin 3 times a day with Wellbutrin Expansive mood evident today -follows locally with [place] Denies increase in depression symptoms but will monitor, especially with stress of move and winter season. Refills provided today as requested

## 2011-12-22 NOTE — Assessment & Plan Note (Signed)
On statin therapy since 2007, denies secondary disease such as peripheral vascular disease, heart disease or stroke Tolerating medication without adverse side effects. Check baseline lipids and LFTs

## 2011-12-25 ENCOUNTER — Telehealth: Payer: Self-pay

## 2011-12-25 NOTE — Telephone Encounter (Signed)
Noted - thanks for message Pt to call today if unimproved

## 2011-12-25 NOTE — Telephone Encounter (Signed)
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 She has been taking Sudafed, Muscinex, and ES Tylenol 2 tab PO prn- last dose at 1000-12/24/11. She is still having headaches and occasional cough productive for yellowish mucos. She is afebrile today. Triage and Care advice per Cough Protocol and appointment advised within 24 hours for "Productive cough with colored sputum". She is going to call on 12/25/11 if sx not improving consistantly. Protocol(s) Used: Cough - Adult Recommended Outcome per Protocol: See Provider within 24 hours Reason for Outcome: Productive cough with colored sputum (other than clear or white sputum) Care Advice: ~ Use a cool mist humidifier to moisten air. Be sure to clean according to manufacturer's instructions. Limit or avoid exposure to irritants and allergens (e.g. air pollution, smoke/smoking, chemicals, dust, pollen, pet dander, etc.) ~ Increase fluids to 8-12 eight oz (1.6 to 2.4 liters) glasses per day, half of them to be water. Soups, popsicles, fruit juices, non-caffeinated sodas (unless restricting sodium intake), jello, broths, decaf teas, etc. are all okay. Warm fluids can be soothing. ~ ~ If you can, stop smoking now and avoid all secondhand smoke. ~ SYMPTOM / CONDITION MANAGEMENT ~ CAUTIONS Coughing up mucus or phlegm helps to get rid of an infection. A productive cough should not be stopped. A cough medicine with guaifenesin (Robitussin,  Mucinex) can help loosen the mucus. Cough medicine with dextromethorphan (DM) should be avoided. Drinking lots of fluids can help loosen the mucus too, especially warm fluids. ~ 12/24/2011 3:01:24PM Page 1 of 1 [id number]

## 2012-01-08 ENCOUNTER — Encounter: Payer: Self-pay | Admitting: Internal Medicine

## 2012-01-08 ENCOUNTER — Ambulatory Visit (INDEPENDENT_AMBULATORY_CARE_PROVIDER_SITE_OTHER): Payer: PRIVATE HEALTH INSURANCE | Admitting: Internal Medicine

## 2012-01-08 ENCOUNTER — Other Ambulatory Visit: Payer: Self-pay | Admitting: *Deleted

## 2012-01-08 VITALS — BP 110/70 | HR 95 | Temp 98.5°F | Ht 61.5 in

## 2012-01-08 DIAGNOSIS — J45909 Unspecified asthma, uncomplicated: Secondary | ICD-10-CM

## 2012-01-08 DIAGNOSIS — H00019 Hordeolum externum unspecified eye, unspecified eyelid: Secondary | ICD-10-CM

## 2012-01-08 DIAGNOSIS — J309 Allergic rhinitis, unspecified: Secondary | ICD-10-CM

## 2012-01-08 MED ORDER — MOMETASONE FUROATE 50 MCG/ACT NA SUSP: 2.0000 | Freq: Every day | NASAL | Status: DC

## 2012-01-08 MED ORDER — HYDROCODONE-HOMATROPINE 5-1.5 MG/5ML PO SYRP: 5.0000 mL | ORAL_SOLUTION | Freq: Three times a day (TID) | ORAL | Status: DC | PRN

## 2012-01-08 MED ORDER — BACITRACIN-POLYMYXIN B 500-10000 UNIT/GM OP OINT: TOPICAL_OINTMENT | Freq: Two times a day (BID) | OPHTHALMIC | Status: DC

## 2012-01-08 MED ORDER — FLUTICASONE-SALMETEROL 250-50 MCG/DOSE IN AEPB: 1.0000 | INHALATION_SPRAY | Freq: Two times a day (BID) | RESPIRATORY_TRACT | Status: DC

## 2012-01-08 NOTE — Progress Notes (Signed)
  Subjective:    Patient ID: [name], female    DOB: 10/11/1950, 61 y.o.   MRN: [id number]  HPI See CC above  Past Medical History  Diagnosis Date  . Dyslipidemia   . Depression   . Osteopenia   . Incisional hernia   . Stress incontinence, female   . Asthma   . ADD (attention deficit disorder)     Review of Systems  Constitutional: Negative for fever, chills and fatigue.  HENT: Positive for congestion, postnasal drip and sinus pressure. Negative for ear pain, sore throat, facial swelling, rhinorrhea, sneezing, mouth sores, trouble swallowing, neck pain and ear discharge.   Eyes: Positive for pain (at stye location <24h). Negative for discharge, itching and visual disturbance.  Respiratory: Positive for cough. Negative for shortness of breath and wheezing.   Cardiovascular: Negative for chest pain.       Objective:   Physical Exam BP 110/70  Pulse 95  Temp 98.5 F (36.9 C) (Oral)  Ht 5' 1.5" (1.562 m)  SpO2 97%  LMP 08/30/2008 Wt Readings from Last 3 Encounters:  12/22/11 172 lb 12.8 oz (78.382 kg)  03/29/11 167 lb (75.751 kg)  01/18/11 165 lb 12.8 oz (75.206 kg)   Constitutional: She appears well-developed and well-nourished. No distress.  HENT: Head: Normocephalic and atraumatic. No sinus tender to palpation. Ears: B TMs ok, no erythema or effusion; Nose: Nose normal. Mouth/Throat: Oropharynx is red, clear and moist. +PND No oropharyngeal exudate.  Eyes: L lower lid with chalazion, no surrounding cellulitis or edema. Conjunctivae and EOM are normal. Pupils are equal, round, and reactive to light. No scleral icterus.  Cardiovascular: Normal rate, regular rhythm and normal heart sounds.  No murmur heard. No BLE edema. Pulmonary/Chest: Effort normal and breath sounds normal. No respiratory distress. She has no wheezes.  Skin: Skin is warm and dry. No rash noted. No erythema.  Psychiatric: She has a normal mood and affect. Her behavior is normal. Judgment and thought  content normal.   Lab Results  Component Value Date   WBC 8.4 12/22/2011   HGB 14.3 12/22/2011   HCT 42.8 12/22/2011   PLT 272.0 12/22/2011   GLUCOSE 102* 12/22/2011   CHOL 171 12/22/2011   TRIG 79.0 12/22/2011   HDL 63.70 12/22/2011   LDLCALC 92 12/22/2011   ALT 26 12/22/2011   AST 24 12/22/2011   NA 139 12/22/2011   K 5.5* 12/22/2011   CL 103 12/22/2011   CREATININE 1.1 12/22/2011   BUN 11 12/22/2011   CO2 29 12/22/2011   TSH 1.64 12/22/2011      Assessment & Plan:    L eye stye, lower lid - education and reassurance provided antibiotics ointment and warm compress  Allergic sinusitis - reviewed no other antibiotics indicated at this time Start nasal steroid - to call for ENT refer if still unimproved   Asthma hx - no current flare but prior use Advair during seasonal flares- Renewed same today

## 2012-01-08 NOTE — Telephone Encounter (Signed)
Faxed script back to [facility].../lmb 

## 2012-01-08 NOTE — Patient Instructions (Signed)
It was good to see you today. If you develop worsening symptoms or fever, call and we can reconsider antibiotics, but it does not appear necessary to use antibiotics at this time. Polysporin eye ointment 2x/day for stye - Nasonex and Advair as discussed Your prescription(s) have been submitted to your pharmacy. Please take as directed and contact our office if you believe you are having problem(s) with the medication(s). Sty A sty (hordeolum) is an infection of a gland in the eyelid located at the base of the eyelash. A sty may develop a white or yellow head of pus. It can be puffy (swollen). Usually, the sty will burst and pus will come out on its own. They do not leave lumps in the eyelid once they drain. A sty is often confused with another form of cyst of the eyelid called a chalazion. Chalazions occur within the eyelid and not on the edge where the bases of the eyelashes are. They often are red, sore and then form firm lumps in the eyelid. CAUSES    Germs (bacteria).   Lasting (chronic) eyelid inflammation.  SYMPTOMS    Tenderness, redness and swelling along the edge of the eyelid at the base of the eyelashes.   Sometimes, there is a white or yellow head of pus. It may or may not drain.  DIAGNOSIS   An ophthalmologist will be able to distinguish between a sty and a chalazion and treat the condition appropriately.   TREATMENT    Styes are typically treated with warm packs (compresses) until drainage occurs.   In rare cases, medicines that kill germs (antibiotics) may be prescribed. These antibiotics may be in the form of drops, cream or pills.   If a hard lump has formed, it is generally necessary to do a small incision and remove the hardened contents of the cyst in a minor surgical procedure done in the office.   In suspicious cases, your caregiver may send the contents of the cyst to the lab to be certain that it is not a rare, but dangerous form of cancer of the glands of the  eyelid.  HOME CARE INSTRUCTIONS    Wash your hands often and dry them with a clean towel. Avoid touching your eyelid. This may spread the infection to other parts of the eye.   Apply heat to your eyelid for 10 to 20 minutes, several times a day, to ease pain and help to heal it faster.   Do not squeeze the sty. Allow it to drain on its own. Wash your eyelid carefully 3 to 4 times per day to remove any pus.  SEEK IMMEDIATE MEDICAL CARE IF:    Your eye becomes painful or puffy (swollen).   Your vision changes.   Your sty does not drain by itself within 3 days.   Your sty comes back within a short period of time, even with treatment.   You have redness (inflammation) around the eye.   You have a fever.  Document Released: 10/26/2004 Document Revised: 04/10/2011 Document Reviewed: 06/30/2008 [facility] Patient Information 2013 [place], [place].

## 2012-01-19 ENCOUNTER — Encounter: Payer: Self-pay | Admitting: Internal Medicine

## 2012-01-31 HISTORY — PX: NASAL POLYP EXCISION: SHX2068

## 2012-02-07 ENCOUNTER — Encounter: Payer: PRIVATE HEALTH INSURANCE | Admitting: Internal Medicine

## 2012-02-10 ENCOUNTER — Other Ambulatory Visit: Payer: Self-pay | Admitting: Internal Medicine

## 2012-03-21 ENCOUNTER — Other Ambulatory Visit: Payer: Self-pay | Admitting: Ophthalmology

## 2012-03-21 ENCOUNTER — Telehealth: Payer: Self-pay | Admitting: Internal Medicine

## 2012-03-21 NOTE — Telephone Encounter (Signed)
OTC multisx cold meds ok to try -  OV if fever or worsening symptoms, esp with asthma hx I do not know if anything more needs to be done without evaluating the symptoms in person thanks

## 2012-03-21 NOTE — Telephone Encounter (Signed)
The patient called stating she has had cold symptoms for two weeks.  I advised her to make an appointment to speak to Dr.Leschber in person about her symptoms.  I offered her several appointment times and dates.  The patient refused each one stating she "did not want to come in if there was nothing that could be done". I told the patient I would document our conversation and forward it to nurse and to Dr.Leschber in hopes of getting better advice.

## 2012-03-22 NOTE — Telephone Encounter (Signed)
[facility] Triage Call Report Triage Record Num: [id number] Operator: Levora Angel Patient Name: [name] Call Date & Time: 03/21/2012 6:03:22PM Patient Phone: [phone] PCP: Rene Paci Patient Gender: Female PCP Fax : [phone] Patient DOB: 07-Dec-1950 Practice Name: [facility] Reason for Call: Caller: Sabree/Patient; PCP: Rene Paci (Adults only); CB#: [phone]; Call regarding Cough/Congestion; Afebrile, Onset: 03/07/12 started with Sinus Congestion patient is currently using neti pot, sudafed,and mucinex to treat with no improvement. Started on Advair and improvement in breathing noted. Coughing up thick yellow mucus. Urgent symptom of "Symptoms worsen after 7 days or symptoms do not improve after 14 days of home care" per Upper Respiratory Infection protocol. Patient refuses appointment at this time and will try Homecare advice. Patient also states that she thinks she has a script on file for Levaquin that she did not pick up for her last sinus visit and if still on file she will pick it up and start medication to resolve symptoms and if no improvement will follow up with office. Protocol(s) Used: Upper Respiratory Infection (URI) Recommended Outcome per Protocol: See Provider within 24 hours Reason for Outcome: Symptoms worsen after 7 days or symptoms do not improve after 14 days of home care Care Advice: ~ Use a cool mist humidifier to moisten air. Be sure to clean according to manufacturer's instructions. ~ SYMPTOM / CONDITION MANAGEMENT Coughing up mucus or phlegm helps to get rid of an infection. A productive cough should not be stopped. A cough medicine with guaifenesin (Robitussin, Mucinex) can help loosen the mucus. Cough medicine with dextromethorphan (DM) should be avoided. Drinking lots of fluids can help loosen the mucus too, especially warm fluids.

## 2012-04-19 ENCOUNTER — Ambulatory Visit (INDEPENDENT_AMBULATORY_CARE_PROVIDER_SITE_OTHER): Payer: BC Managed Care – PPO | Admitting: Internal Medicine

## 2012-04-19 ENCOUNTER — Telehealth: Payer: Self-pay | Admitting: *Deleted

## 2012-04-19 ENCOUNTER — Ambulatory Visit (INDEPENDENT_AMBULATORY_CARE_PROVIDER_SITE_OTHER)
Admission: RE | Admit: 2012-04-19 | Discharge: 2012-04-19 | Disposition: A | Discharge status: Home / Self Care | Payer: BC Managed Care – PPO | Source: Ambulatory Visit | Attending: Internal Medicine | Admitting: Internal Medicine

## 2012-04-19 ENCOUNTER — Encounter: Payer: Self-pay | Admitting: Internal Medicine

## 2012-04-19 VITALS — BP 126/76 | HR 76 | Temp 98.6°F | Ht 61.5 in | Wt 177.8 lb

## 2012-04-19 DIAGNOSIS — M79671 Pain in right foot: Secondary | ICD-10-CM

## 2012-04-19 DIAGNOSIS — F988 Other specified behavioral and emotional disorders with onset usually occurring in childhood and adolescence: Secondary | ICD-10-CM

## 2012-04-19 DIAGNOSIS — M79609 Pain in unspecified limb: Secondary | ICD-10-CM

## 2012-04-19 MED ORDER — METHYLPHENIDATE HCL 20 MG PO TABS: 20.0000 mg | ORAL_TABLET | Freq: Three times a day (TID) | ORAL | Status: DC

## 2012-04-19 NOTE — Patient Instructions (Signed)
Morton's Neuroma  Neuralgia (nerve pain) or neuroma (benign [non-cancerous] nerve tumor) may develop on any interdigital nerve. The interdigital nerves (nerves between digits) of the foot travel beneath and between the metatarsals (long bones of the fore foot) and pass the nerve endings to the toes. The third interdigital is a common place for a small neuroma to form called Morton's neuroma. Another nerve to be affected commonly is the fourth interdigital nerve. This would be in approximately in the area of the base or ball under the bottom of your fourth toe. This condition occurs more commonly in women and is usually on one side. It is usually first noticed by pain radiating (spreading) to the ball of the foot or to the toes.  CAUSES  The cause of interdigital neuralgia may be from low grade repetitive trauma (damage caused by an accident) as in activities causing a repeated pounding of the foot (running, jumping etc.). It is also caused by improper footwear or recent loss of the fatty padding on the bottom of the foot.  TREATMENT    The condition often resolves (goes away) simply with decreasing activity if that is thought to be the cause. Proper shoes are beneficial. Orthotics (special foot support aids) such as a metatarsal bar are often beneficial. This condition usually responds to conservative therapy, however if surgery is necessary it usually brings complete relief.  HOME CARE INSTRUCTIONS     Apply ice to the area of soreness for 15 to 20 minutes, 3 to 4 times per day, while awake for the first 2 days. Put ice in a plastic bag and place a towel between the bag of ice and your skin.   Only take over-the-counter or prescription medicines for pain, discomfort, or fever as directed by your caregiver.  MAKE SURE YOU:     Understand these instructions.   Will watch your condition.   Will get help right away if you are not doing well or get worse.   Document Released: 04/24/2000 Document Revised: 04/10/2011 Document Reviewed: 01/16/2005  Exit[employer] Patient Information 2013 [employer]

## 2012-04-19 NOTE — Telephone Encounter (Signed)
Pt is asking for advisement on what to do regarding trigger finger.

## 2012-04-19 NOTE — Progress Notes (Signed)
Subjective:    Patient ID: [name], female    DOB: April 08, 1950, 62 y.o.   MRN: [id number]  HPI  Pt presents to the clinic today with c/o right foot pain. This started 1 week ago after she had bough a brand new pair of shoes. She contributed the pain to just breaking the shoes in but after 2 days, the pain was so bad that she was unable to wear those shoes any longer. She switched back to a pair of shoes that were very comfortable for her in the past but she continued to have pain. The pain is a 4/10. She has not taken anything for the pain. She has not had pain like this in the past. She denies injury to the area. She has pain with walking but no difficulty with gait. She denies numbness and tingling of her foot. Additionally today, she would like a refill of her Ritalin.  Review of Systems      Past Medical History  Diagnosis Date  . Dyslipidemia   . Depression   . Osteopenia   . Incisional hernia   . Stress incontinence, female   . Asthma   . ADD (attention deficit disorder)     Current Outpatient Prescriptions  Medication Sig Dispense Refill  . bacitracin-polymyxin b (POLYSPORIN) ophthalmic ointment Apply to eye every 12 (twelve) hours. apply to eye every 12 hours while awake  3.5 g  0  . buPROPion (WELLBUTRIN SR) 150 MG 12 hr tablet Take 150 mg by mouth every morning.      . calcium carbonate (TUMS EX) 750 MG chewable tablet Chew 1 tablet by mouth 2 (two) times daily.      . Fluticasone-Salmeterol (ADVAIR) 250-50 MCG/DOSE AEPB Inhale 1 puff into the lungs 2 (two) times daily.  60 each  1  . HYDROcodone-homatropine (HYCODAN) 5-1.5 MG/5ML syrup Take 5 mLs by mouth every 8 (eight) hours as needed for cough.  120 mL  0  . methylphenidate (RITALIN) 20 MG tablet Take 1 tablet (20 mg total) by mouth 3 (three) times daily.  90 tablet  0  . Misc Natural Products (TART CHERRY ADVANCED) CAPS Take by mouth. 465MG   1-2 by mouth daily      . mometasone (NASONEX) 50 MCG/ACT nasal spray  Place 2 sprays into the nose daily.  17 g  12  . Multiple Vitamin (MULTIVITAMIN) tablet Take 1 tablet by mouth daily.        . simvastatin (ZOCOR) 40 MG tablet TAKE 1 TABLET BY MOUTH AT BEDTIME  90 tablet  3   No current facility-administered medications for this visit.    Allergies  Allergen Reactions  . Aspirin     Family History  Problem Relation Age of Onset  . Arthritis Mother   . Heart disease Mother     CABG @ 66  . Diabetes Mother   . Osteoporosis Mother   . Arthritis Father   . Lymphoma Father 68    chemo  . Cancer Father     lymphoma  . Lymphoma Paternal Grandmother   . Diabetes Maternal Grandmother     History   Social History  . Marital Status: Single    Spouse Name: N/A    Number of Children: N/A  . Years of Education: N/A   Occupational History  . Not on file.   Social History Main Topics  . Smoking status: Never Smoker   . Smokeless tobacco: Not on file  . Alcohol Use: Yes  Comment: social  . Drug Use: No  . Sexually Active: Yes    Birth Control/ Protection: Post-menopausal   Other Topics Concern  . Not on file   Social History Narrative  . No narrative on file     Constitutional: Denies fever, malaise, fatigue, headache or abrupt weight changes.  Musculoskeletal: Pt reports right foot pain.Denies decrease in range of motion, difficulty with gait, muscle pain or joint pain and swelling.  Skin: Denies redness, rashes, lesions or ulcercations.  Neurological: Denies dizziness, difficulty with memory, difficulty with speech or problems with balance and coordination.   No other specific complaints in a complete review of systems (except as listed in HPI above).  Objective:   Physical Exam   BP 126/76  Pulse 76  Temp(Src) 98.6 F (37 C) (Oral)  Ht 5' 1.5" (1.562 m)  Wt 177 lb 12 oz (80.627 kg)  BMI 33.05 kg/m2  SpO2 97%  LMP 08/30/2008 Wt Readings from Last 3 Encounters:  04/19/12 177 lb 12 oz (80.627 kg)  12/22/11 172 lb 12.8  oz (78.382 kg)  03/29/11 167 lb (75.751 kg)    General: Appears her stated age, well developed, well nourished in NAD. Skin: Warm, dry and intact. No rashes, lesions or ulcerations noted. Cardiovascular: Normal rate and rhythm. S1,S2 noted.  No murmur, rubs or gallops noted. No JVD or BLE edema. No carotid bruits noted. Pulmonary/Chest: Normal effort and positive vesicular breath sounds. No respiratory distress. No wheezes, rales or ronchi noted.  Musculoskeletal: Normal range of motion of right foot. No signs of joint swelling. No difficulty with gait.  Neurological: Alert and oriented. Cranial nerves II-XII intact. Coordination normal. +DTRs bilaterally. Psychiatric: Mood and affect normal. Behavior is normal. Judgment and thought content normal.         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

## 2012-04-22 ENCOUNTER — Encounter: Payer: Self-pay | Admitting: Internal Medicine

## 2012-04-24 ENCOUNTER — Telehealth: Payer: Self-pay | Admitting: *Deleted

## 2012-04-24 NOTE — Telephone Encounter (Signed)
R'cd PA from [facility] for generic Ritalin, faxed form to insurance company, awaiting response.

## 2012-04-25 NOTE — Telephone Encounter (Signed)
PA approved from 04/03/2012-04/24/2013. [facility] informed.

## 2012-05-22 ENCOUNTER — Telehealth: Payer: Self-pay | Admitting: Internal Medicine

## 2012-05-22 MED ORDER — SIMVASTATIN 40 MG PO TABS: ORAL_TABLET | ORAL | Status: DC

## 2012-05-22 NOTE — Telephone Encounter (Signed)
Called pt no answer LMOM no mail service on file will send 30 day to local, until she is able to provide [place] with mail service information...lmb

## 2012-05-22 NOTE — Telephone Encounter (Signed)
Patient is requesting a call back to discuss some symptoms she is having as well as a 30 day supply of Simvastatin that she would like to be sent in to a mail order pharmacy

## 2012-05-24 ENCOUNTER — Telehealth: Payer: Self-pay | Admitting: *Deleted

## 2012-05-24 MED ORDER — SIMVASTATIN 40 MG PO TABS: ORAL_TABLET | ORAL | Status: DC

## 2012-05-24 NOTE — Telephone Encounter (Signed)
Left msg on vm doesn't have a mail service set up yet. Would like to get written rx so she can mail with forms to whichever she choose to go with. Also still having issues with ongoing sinus problem. Called pt back no answer LMOM will leave rx for simvastatin for her to pick up & pls make a f/u appt with dr Felicity Coyer concerning her sinus issues...Raechel Chute

## 2012-06-13 ENCOUNTER — Other Ambulatory Visit: Payer: Self-pay | Admitting: *Deleted

## 2012-06-13 DIAGNOSIS — F988 Other specified behavioral and emotional disorders with onset usually occurring in childhood and adolescence: Secondary | ICD-10-CM

## 2012-06-13 MED ORDER — METHYLPHENIDATE HCL 20 MG PO TABS: 20.0000 mg | ORAL_TABLET | Freq: Three times a day (TID) | ORAL | Status: DC

## 2012-06-13 NOTE — Telephone Encounter (Signed)
Left msg needing refill on her generic ritalin...lmb

## 2012-06-13 NOTE — Telephone Encounter (Signed)
Called pt no answer LMOM rx ready for pick-up.../lmb 

## 2012-07-26 ENCOUNTER — Telehealth: Payer: Self-pay | Admitting: Internal Medicine

## 2012-07-26 ENCOUNTER — Ambulatory Visit (INDEPENDENT_AMBULATORY_CARE_PROVIDER_SITE_OTHER)
Admission: RE | Admit: 2012-07-26 | Discharge: 2012-07-26 | Disposition: A | Discharge status: Home / Self Care | Payer: BC Managed Care – PPO | Source: Ambulatory Visit | Attending: Internal Medicine | Admitting: Internal Medicine

## 2012-07-26 ENCOUNTER — Encounter: Payer: Self-pay | Admitting: Internal Medicine

## 2012-07-26 ENCOUNTER — Ambulatory Visit (INDEPENDENT_AMBULATORY_CARE_PROVIDER_SITE_OTHER): Payer: BC Managed Care – PPO | Admitting: Internal Medicine

## 2012-07-26 VITALS — BP 126/70 | HR 76 | Temp 96.8°F | Resp 16 | Wt 174.1 lb

## 2012-07-26 DIAGNOSIS — M65839 Other synovitis and tenosynovitis, unspecified forearm: Secondary | ICD-10-CM

## 2012-07-26 DIAGNOSIS — M65849 Other synovitis and tenosynovitis, unspecified hand: Secondary | ICD-10-CM

## 2012-07-26 DIAGNOSIS — M659 Synovitis and tenosynovitis, unspecified: Secondary | ICD-10-CM

## 2012-07-26 DIAGNOSIS — M21611 Bunion of right foot: Secondary | ICD-10-CM

## 2012-07-26 DIAGNOSIS — F32A Depression, unspecified: Secondary | ICD-10-CM

## 2012-07-26 DIAGNOSIS — M21619 Bunion of unspecified foot: Secondary | ICD-10-CM

## 2012-07-26 DIAGNOSIS — J329 Chronic sinusitis, unspecified: Secondary | ICD-10-CM | POA: Insufficient documentation

## 2012-07-26 DIAGNOSIS — F329 Major depressive disorder, single episode, unspecified: Secondary | ICD-10-CM

## 2012-07-26 DIAGNOSIS — F3289 Other specified depressive episodes: Secondary | ICD-10-CM

## 2012-07-26 MED ORDER — BUPROPION HCL ER (XL) 150 MG PO TB24: 300.0000 mg | ORAL_TABLET | Freq: Every day | ORAL | Status: DC

## 2012-07-26 MED ORDER — MOMETASONE FUROATE 50 MCG/ACT NA SUSP: 2.0000 | Freq: Every day | NASAL | Status: DC

## 2012-07-26 NOTE — Telephone Encounter (Signed)
The patient stated she can only have Friday appointments for referral apts being made.  Thanks!

## 2012-07-26 NOTE — Progress Notes (Signed)
  Subjective:    Patient ID: [name], female    DOB: 1950/12/30, 62 y.o.   MRN: [id number]  HPI  See CC above - reviewed several concerns  Past Medical History  Diagnosis Date  . Dyslipidemia   . Depression   . Osteopenia   . Incisional hernia   . Stress incontinence, female   . Asthma   . ADD (attention deficit disorder)     Review of Systems  Constitutional: Negative for fever, chills and fatigue.  HENT: Positive for congestion, postnasal drip and sinus pressure. Negative for ear pain, sore throat, facial swelling, rhinorrhea, sneezing, mouth sores, trouble swallowing, neck pain and ear discharge.   Eyes: Negative for discharge, itching and visual disturbance.  Respiratory: Positive for cough. Negative for shortness of breath.        Objective:   Physical Exam  BP 126/70  Pulse 76  Temp(Src) 96.8 F (36 C) (Oral)  Resp 16  Wt 174 lb 1.3 oz (78.962 kg)  BMI 32.36 kg/m2  SpO2 96%  LMP 08/30/2008 Wt Readings from Last 3 Encounters:  07/26/12 174 lb 1.3 oz (78.962 kg)  04/19/12 177 lb 12 oz (80.627 kg)  12/22/11 172 lb 12.8 oz (78.382 kg)   Constitutional: She appears well-developed and well-nourished. No distress.  HENT: Head: Normocephalic and atraumatic. No sinus tender to palpation. Ears: B TMs ok, no erythema or effusion; Nose: Nose normal. Mouth/Throat: Oropharynx is red, clear and moist. +PND No oropharyngeal exudate.  Cardiovascular: Normal rate, regular rhythm and normal heart sounds.  No murmur heard. No BLE edema. Pulmonary/Chest: Effort normal and breath sounds normal. No respiratory distress. She has no wheezes.  Musculoskeletal: right greater than left foot bunion deformity. Left fourth ring finger with tender at a1 pulley - occ triggers with AROM, no swelling, NV intact, no signs infection Skin: Skin is warm and dry. No rash noted. No erythema.  Psychiatric: She has a expansive but baseline mood and affect. Her behavior is normal. Judgment and thought  content normal.   Lab Results  Component Value Date   WBC 8.4 12/22/2011   HGB 14.3 12/22/2011   HCT 42.8 12/22/2011   PLT 272.0 12/22/2011   GLUCOSE 102* 12/22/2011   CHOL 171 12/22/2011   TRIG 79.0 12/22/2011   HDL 63.70 12/22/2011   LDLCALC 92 12/22/2011   ALT 26 12/22/2011   AST 24 12/22/2011   NA 139 12/22/2011   K 5.5* 12/22/2011   CL 103 12/22/2011   CREATININE 1.1 12/22/2011   BUN 11 12/22/2011   CO2 29 12/22/2011   TSH 1.64 12/22/2011      Assessment & Plan:   Right foot bunion, refer to podiatry  Left ring trigger finger. No locking, occasional popping - discussed options for treatment, patient declines steroid injection at this time. Patient will use "double Band-Aid" therapy at that time and seek followup at hand specialist if symptoms progress/worse

## 2012-07-26 NOTE — Assessment & Plan Note (Signed)
Levaquin x 2 courses 11/13, 2/14 - continued discolored PND rx nasal steroids, hold current antibiotics as no acute flare Refer for CT sinus and ENT

## 2012-07-26 NOTE — Patient Instructions (Signed)
It was good to see you today. We have reviewed your prior records including labs and tests today. Medications reviewed and updated Your prescription(s) have been submitted to your pharmacy. Please take as directed and contact our office if you believe you are having problem(s) with the medication(s). we'll make referral to ENT, podiatry and CT sinus. Our office will contact you regarding appointment(s) once made.

## 2012-07-27 NOTE — Assessment & Plan Note (Signed)
Long hx overlapping depression, anxiety and ADD symptoms. Previously on multiple SSRIs or SNRI therapy including Cymbalta, Paxil and/or Prozac Stopped Cymbalta spring 2012 -? Need to resume in place of bupropion tx? Denies SI/HI - Advised follow up with psychiatry for specialty management of this complex problem Until then, as no acute decompensation, continue same medications without change, refills provided.

## 2012-07-30 ENCOUNTER — Other Ambulatory Visit: Payer: BC Managed Care – PPO

## 2012-08-09 ENCOUNTER — Ambulatory Visit: Payer: BC Managed Care – PPO | Admitting: Internal Medicine

## 2012-09-03 ENCOUNTER — Telehealth: Payer: Self-pay | Admitting: *Deleted

## 2012-09-03 DIAGNOSIS — F988 Other specified behavioral and emotional disorders with onset usually occurring in childhood and adolescence: Secondary | ICD-10-CM

## 2012-09-03 MED ORDER — METHYLPHENIDATE HCL 20 MG PO TABS: 20.0000 mg | ORAL_TABLET | Freq: Three times a day (TID) | ORAL | Status: DC

## 2012-09-03 NOTE — Telephone Encounter (Signed)
Ok - rx done and signed for pick up

## 2012-09-03 NOTE — Telephone Encounter (Signed)
Left msg that Rx is ready for pick up

## 2012-09-03 NOTE — Telephone Encounter (Signed)
Pt called requesting Refill on Methylphenidate 20 mg.  Please advise

## 2012-09-16 ENCOUNTER — Telehealth: Payer: Self-pay | Admitting: Internal Medicine

## 2012-09-16 NOTE — Telephone Encounter (Signed)
Received 3 pages from [facility], sent to [facility] on 09/16/12/ss

## 2012-09-19 ENCOUNTER — Other Ambulatory Visit: Payer: Self-pay | Admitting: Otolaryngology

## 2012-11-05 ENCOUNTER — Telehealth: Payer: Self-pay | Admitting: *Deleted

## 2012-11-05 NOTE — Telephone Encounter (Signed)
Left msg on triage stating need refill on ritalin...lmb

## 2012-11-06 MED ORDER — METHYLPHENIDATE HCL 20 MG PO TABS: 20.0000 mg | ORAL_TABLET | Freq: Three times a day (TID) | ORAL | Status: DC

## 2012-11-06 NOTE — Telephone Encounter (Signed)
Called pt no answer LMOM rx ready for pick-up.../lmb 

## 2012-11-06 NOTE — Telephone Encounter (Signed)
Ok -printed and signed 

## 2012-11-07 ENCOUNTER — Encounter: Payer: Self-pay | Admitting: Podiatrist

## 2012-11-07 NOTE — Progress Notes (Deleted)
Subjective:     Patient ID: [name], female   DOB: [birth year], 62 y.o.   MRN: [id number]  HPI Comments: Dispensed pts orthotics with oral and written wearing instructions. Return office visit PRN.    Review of Systems     Objective:   Physical Exam     Assessment:     ***    Plan:     ***

## 2012-11-07 NOTE — Patient Instructions (Signed)

## 2012-11-07 NOTE — Progress Notes (Signed)
Dispensed pts orthotics with oral and written wearing instructions. Return office visit PRN.

## 2012-11-21 ENCOUNTER — Encounter: Payer: Self-pay | Admitting: Internal Medicine

## 2012-12-05 ENCOUNTER — Ambulatory Visit: Payer: Self-pay | Admitting: Podiatrist

## 2012-12-16 ENCOUNTER — Encounter: Payer: Self-pay | Admitting: Family Medicine

## 2012-12-16 ENCOUNTER — Ambulatory Visit (INDEPENDENT_AMBULATORY_CARE_PROVIDER_SITE_OTHER): Payer: BC Managed Care – PPO | Admitting: Family Medicine

## 2012-12-16 ENCOUNTER — Other Ambulatory Visit (INDEPENDENT_AMBULATORY_CARE_PROVIDER_SITE_OTHER): Payer: BC Managed Care – PPO

## 2012-12-16 VITALS — BP 144/92 | HR 97 | Wt 168.0 lb

## 2012-12-16 DIAGNOSIS — R519 Headache, unspecified: Secondary | ICD-10-CM

## 2012-12-16 DIAGNOSIS — R51 Headache: Secondary | ICD-10-CM

## 2012-12-16 LAB — CBC WITH DIFFERENTIAL/PLATELET
Basophils Absolute: 0 10*3/uL (ref 0.0–0.1)
Basophils Relative: 0.5 % (ref 0.0–3.0)
Eosinophils Absolute: 0.1 10*3/uL (ref 0.0–0.7)
Hemoglobin: 15.1 g/dL — ABNORMAL HIGH (ref 12.0–15.0)
MCHC: 33.5 g/dL (ref 30.0–36.0)
MCV: 88.7 fl (ref 78.0–100.0)
Monocytes Absolute: 0.6 10*3/uL (ref 0.1–1.0)
Neutro Abs: 5.1 10*3/uL (ref 1.4–7.7)
RBC: 5.07 Mil/uL (ref 3.87–5.11)
RDW: 13.8 % (ref 11.5–14.6)

## 2012-12-16 MED ORDER — PREDNISONE 50 MG PO TABS: 50.0000 mg | ORAL_TABLET | Freq: Every day | ORAL | Status: DC

## 2012-12-16 NOTE — Progress Notes (Signed)
Pre-visit discussion using our clinic review tool. No additional management support is needed unless otherwise documented below in the visit note.  

## 2012-12-16 NOTE — Patient Instructions (Addendum)
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 I will call you tomorrow Prednisone daily for five days, take with food and famotidine  Ice 20 minutes could help Work on the self manipulation of the jaw as well.   [name] was possibly reccommended for [facility] Address: [address] [place], [place] [address] [phone]  If not better or worse in 3 days come back.    Temporomandibular Problems  Temporomandibular joint (TMJ) dysfunction means there are problems with the joint between your jaw and your skull. This is a joint lined by cartilage like other joints in your body but also has a small disc in the joint which keeps the bones from rubbing on each other. These joints are like other joints and can get inflamed (sore) from arthritis and other problems. When this joint gets sore, it can cause headaches and pain in the jaw and the face. CAUSES  Usually the arthritic types of problems are caused by soreness in the joint. Soreness in the joint can also be caused by overuse. This may come from grinding your teeth. It may also come from mis-alignment in the joint. DIAGNOSIS Diagnosis of this condition can often be made by history and exam. Sometimes your caregiver may need X-rays or an MRI scan to determine the exact cause. It may be necessary to see your dentist to determine if your teeth and jaws are lined up correctly. TREATMENT  Most of the time this problem is not serious; however, sometimes it can persist (become chronic). When this happens medications that will cut down on inflammation (soreness) help. Sometimes a shot of cortisone into the joint will be helpful. If your teeth are not aligned it may help for your dentist to make a splint for your mouth that can help this problem. If no physical problems can be found, the problem may come from tension. If tension is found to be the cause,  biofeedback or relaxation techniques may be helpful. HOME CARE INSTRUCTIONS   Later in the day, applications of ice packs may be helpful. Ice can be used in a plastic bag with a towel around it to prevent frostbite to skin. This may be used about every 2 hours for 20 to 30 minutes, as needed while awake, or as directed by your caregiver.  Only take over-the-counter or prescription medicines for pain, discomfort, or fever as directed by your caregiver.  If physical therapy was prescribed, follow your caregiver's directions.  Wear mouth appliances as directed if they were given. Document Released: 10/11/2000 Document Revised: 04/10/2011 Document Reviewed: 01/19/2008 [facility] Patient Information 2014 [employer], [place].  Temporal Arteritis Arteries are blood vessels that carry blood from the heart to all parts of the body. Temporal arteritis is a swelling (inflammation) of certain large arteries. This usually affects arteries in the head and neck area, including arteries in the area on the side of the head, between the ears and eyes (temples). The condition can be very painful. It also can cause serious problems, even blindness. Early diagnosis and treatment is very important. CAUSES  Temporal arteritis results from the body reacting to injury or infection (inflammation). This may occur when the body's immune system (which fights germs and disease) makes a mistake. It attacks its own arteries. No one knows why this happens. However, certain things (risk factors) make it more likely that a person will develop temporal  arteritis. They include:  Age. Most people with temporal arteritis are older than 50. The average age is 84.  Sex. Three times more women than men develop the condition.  Race and ethnic background. Caucasians are more likely to have temporal arteritis than other races. So are people whose families came from [place]  Having polymyalgia  rheumatica (PMR). This condition causes stiffness and pain in the joints of the neck, shoulders and hips. About 15% of people with PMR also have temporal arteritis. SYMPTOMS  Not everyone with temporal arteritis has the same symptoms. Some people have just one symptom. Others may have several. The most common symptom is a new headache, often in the temple region. Symptoms may show up in other parts of the body too.   Symptoms affecting the head may include:  Temporal arteries that feel hard or swollen. It may hurt when the temples are touched.  Pain when combing your hair, or when laying your head on a pillow.  Pain in the jaw when chewing.  Pain in the throat or tongue.  Visual problems, including sudden loss of vision in one eye, or seeing double.  Symptoms in other parts of the body may include:  Fever.  Fatigue.  A dry cough.  Pain in the hips and shoulders.  Pain in the arms during exercise.  Depression.  Weight loss. DIAGNOSIS  Symptoms of temporal arteritis are similar to symptoms for other conditions. This can make it hard to tell if you have the condition. To be sure, your caregiver will ask about your symptoms and do a physical exam. Certain tests may be necessary, such as:   An exam of your temples. Often, the temporal arteries will be swollen and hard. This can be felt.  A complete blood count. This test shows how many red blood cells are in your blood. Most people with temporal arteritis do not have enough red blood cells (anemic).  Erythrocyte sedimentation (also called sed rate test). It measures inflammation in the body. Almost everyone with temporal arteritis has a high sed rate.  C reactive protein test. This also shows if there is inflammation.  Biopsy a temporal artery. This means the caregiver will take out a small piece of an artery. Then, it is checked under a microscope for inflammation. More than one biopsy may be needed. That is because inflammation  can be in one part of an artery and not in others. Your caregiver may need to check more than one spot. TREATMENT  Starting treatment right away is very important. Often, you will need to see a specialist in immunologic diseases (rheumatologist). Goals of treatment include protecting your eyesight. Once vision is gone, it might not come back. The normal treatment is medication. It usually works well and quickly. Most people start getting better in a few days. Medication options include:  Corticosteroids. These are powerful drugs that fight inflammation. These drugs are most often used to treat temporal arteritis.  Usually, a high dose is taken at first. After symptoms improve, a smaller dose is used. The goal is to take the smallest dose possible and still control your symptoms. That is because using corticosteroids for a long time can cause problems. They can make muscles and bones weak. They can cause blood pressure to go up, and cause diabetes. Also, people often gain weight when they take corticosteroids. Corticosteroids may need to be taken for one or two years.  Several newer drugs are being tested  to treat temporal arteritis. Researchers are testing to see if new drugs will work as well as corticosteroids, but cause fewer problems than them. Testing of these drugs is not yet complete.  Some specialists recommend low dose aspirin to prevent blood clots. HOME CARE INSTRUCTIONS   Take any corticosteroids that your caregiver prescribes. Follow the directions carefully.  Take any vitamins or supplements that your caregiver suggests. This may include vitamin D and calcium. They help keep your bones from becoming weak.  Keep all appointments for checkups. Your caregiver will watch for any problems from the medication. Checkups may include:  Periodic blood tests.  Bone density testing. This checks how strong or weak your bones are.  Blood pressure checks. If your blood pressure rises, you may  need to take a drug to control it while you are taking corticosteroids.  Blood sugar checks. This is to be sure you are not developing diabetes. If you have diabetes, corticosteroid medications may make it worse and require increased treatment.  Exercise. First, talk with your caregiver about what would be OK for you to do. Aerobic exercise (which increases your heart rate) is usually suggested. It does not have to require a lot of energy. Walking is aerobic exercise. This type of exercise is good because it helps prevent bone loss. It also helps control your blood pressure.  Follow a healthy diet. The goal is to prevent bone damage and diabetes. Include good sources of protein in your diet. Also, include fruits, vegetables and whole grains. Your caregiver can refer you to an expert on healthy eating (dietitian) for more advice. SEEK MEDICAL CARE IF:   The symptoms that lead to your diagnosis return.  You develop worsening fever, fatigue, headache, weight loss, or pain in your jaw.  You develop signs of infection. Infections can be worse if you are on corticosteroid medication. SEEK IMMEDIATE MEDICAL CARE IF:   Your eyesight changes.  Pain does not go away, even after taking pain medicine.  You feel pain in your chest.  Breathing is difficult.  One side of your face or body suddenly becomes weak or numb.  You develop a fever of more than 102 F (38.9 C). Document Released: 11/13/2008 Document Revised: 04/10/2011 Document Reviewed: 11/13/2008 [facility] Patient Information 2014 [place], [place].

## 2012-12-16 NOTE — Progress Notes (Signed)
SUBJECTIVE:  The patient has a 3 day(s) history of recurrent left jaw pain and clicking while chewing or opening mouth. She denies a history of injury to this area. Patient did recently have sinus surgery back in August of this year. 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 Patient feels that the pain is mostly a sharp pain and it is sore to palpation just anterior to the year. This can radiate up the side of her head as well as around to her neck. Patient denies any rash in the area, but states that the pain has been so severe it has kept her up at night. Patient denies any visual changes but did have some tearing of the eye. Patient states that she's only been able to eat soft food secondary to the pain. No recent illnesses but did get her shingles vaccine within the last 2 weeks. Patient rates the pain 8/10.  OBJECTIVE: Blood pressure 144/92, pulse 97, weight 168 lb (76.204 kg), last menstrual period 08/30/2008, SpO2 95.00%. General: Patient appears moderately anxious Appears well, in no apparent distress.   Vital signs are normal.  Ears normal.  Eyes: Pupils equal round react to light and accommodation, vessels visualized on ophthalmic exam and no significant inflammation noted. Throat and pharynx normal. Neck supple. No adenopathy or masses in the neck or supraclavicular regions. Sinuses non tender. Patient does have severe tenderness in the preauricular area. This does seem to expand superior. The left temporomandibular joint reveals tenderness, pain and clicking with opening. The right temporomandibular joint reveals no tenderness, no clicking, no deformity and no pain with opening. Teeth are non tender. No signs of abscess in the mouth.  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   PLAN: Recommended a soft diet, patient given prednisone just in case this is temporal arteritis. and Dental consult. Explained nature of TMJ  syndrome, treatment modalities and insurance coverage issues. Discussed with patient as well about temporal arteritis and given red flags and went to seek medical attention., Medications per orders, labs per orders. Patient will followup in 72 hours for further evaluation.

## 2012-12-17 ENCOUNTER — Telehealth: Payer: Self-pay | Admitting: Family Medicine

## 2012-12-17 NOTE — Telephone Encounter (Signed)
Called patient and told her that her labs are within normal limits. Patient did take prednisone and did notice mild improvement.

## 2012-12-18 ENCOUNTER — Telehealth: Payer: Self-pay | Admitting: *Deleted

## 2012-12-18 NOTE — Telephone Encounter (Signed)
Pt staes has not been able to wear orthotics, because she can't find any shoes they fit in.  Does she need to keep 12/19/2012 appt?  I told pt to keep the appt bring the orthotic and the type of shoes, attempted to put them in.  Pt agreed.

## 2012-12-19 ENCOUNTER — Ambulatory Visit (INDEPENDENT_AMBULATORY_CARE_PROVIDER_SITE_OTHER): Payer: BC Managed Care – PPO | Admitting: Podiatrist

## 2012-12-19 ENCOUNTER — Encounter: Payer: Self-pay | Admitting: Podiatrist

## 2012-12-19 VITALS — BP 129/70 | HR 99 | Resp 12

## 2012-12-19 DIAGNOSIS — M21619 Bunion of unspecified foot: Secondary | ICD-10-CM

## 2012-12-19 DIAGNOSIS — M21611 Bunion of right foot: Secondary | ICD-10-CM

## 2012-12-19 DIAGNOSIS — M205X1 Other deformities of toe(s) (acquired), right foot: Secondary | ICD-10-CM

## 2012-12-19 DIAGNOSIS — M205X9 Other deformities of toe(s) (acquired), unspecified foot: Secondary | ICD-10-CM

## 2012-12-24 NOTE — Progress Notes (Signed)
Subjective: Patient presents today for followup of orthotics. She states they're too long for all of her shoes as she brings several pairs of shoes that they will not fit in. She states she was instructed she could cut the inserts and they were dispensed however she does not feel comfortable trimming the inserts down herself. Objective: Inserts appear to contour for her foot nicely. They are about a quarter-inch too long which is removed for her at today's visit.  Plan: Adjusted the inserts. May have to adjust arch height or remove the met pad back on the left insert. May also need to remove the rigid extension if this is uncomfortable as well. I'll see her back for recheck in 3-4 weeks.

## 2013-01-16 ENCOUNTER — Other Ambulatory Visit: Payer: Self-pay | Admitting: Internal Medicine

## 2013-01-16 NOTE — Telephone Encounter (Signed)
I do not prescribe Adderall for this patient. does she mean Ritalin? Immunizations updated in EMR. Thanks

## 2013-01-16 NOTE — Telephone Encounter (Signed)
Pt is requesting a refill on adderall to pick up next week.   Pt's roommate has the flu and told her to get Tamiflu.  She had a flu shot in sept. At school Zostavax also on Sept 10 (fluvirin)

## 2013-01-17 ENCOUNTER — Ambulatory Visit: Payer: BC Managed Care – PPO | Admitting: Podiatrist

## 2013-01-17 MED ORDER — METHYLPHENIDATE HCL 20 MG PO TABS: 20.0000 mg | ORAL_TABLET | Freq: Three times a day (TID) | ORAL | Status: DC

## 2013-01-17 NOTE — Telephone Encounter (Signed)
Called pt no answer LMOM rx ready for pick-up.../lmb 

## 2013-01-17 NOTE — Telephone Encounter (Signed)
Yes pt requesting refill on her ritalin.Also want to know does she need to take tamiflu since her roommate has the flu....lmb

## 2013-01-20 ENCOUNTER — Other Ambulatory Visit: Payer: Self-pay | Admitting: Internal Medicine

## 2013-02-26 ENCOUNTER — Encounter: Payer: Self-pay | Admitting: Internal Medicine

## 2013-02-26 ENCOUNTER — Ambulatory Visit (INDEPENDENT_AMBULATORY_CARE_PROVIDER_SITE_OTHER): Payer: BC Managed Care – PPO | Admitting: Internal Medicine

## 2013-02-26 VITALS — BP 120/92 | HR 88 | Temp 98.8°F | Ht 61.5 in | Wt 173.4 lb

## 2013-02-26 DIAGNOSIS — M8580 Other specified disorders of bone density and structure, unspecified site: Secondary | ICD-10-CM

## 2013-02-26 DIAGNOSIS — D492 Neoplasm of unspecified behavior of bone, soft tissue, and skin: Secondary | ICD-10-CM

## 2013-02-26 DIAGNOSIS — N393 Stress incontinence (female) (male): Secondary | ICD-10-CM

## 2013-02-26 DIAGNOSIS — M899 Disorder of bone, unspecified: Secondary | ICD-10-CM

## 2013-02-26 DIAGNOSIS — K432 Incisional hernia without obstruction or gangrene: Secondary | ICD-10-CM

## 2013-02-26 DIAGNOSIS — Z0000 Encounter for general adult medical examination without abnormal findings: Secondary | ICD-10-CM

## 2013-02-26 DIAGNOSIS — M949 Disorder of cartilage, unspecified: Secondary | ICD-10-CM

## 2013-02-26 DIAGNOSIS — E785 Hyperlipidemia, unspecified: Secondary | ICD-10-CM

## 2013-02-26 DIAGNOSIS — Z124 Encounter for screening for malignant neoplasm of cervix: Secondary | ICD-10-CM

## 2013-02-26 NOTE — Assessment & Plan Note (Signed)
Patient previously on a weekly bisphosphonate therapy (actonel) for 4 years until fall 2011 when DEXA showed improvement in bone density. (normal by report)  Maintained on over-the-counter calcium supplements and weightbearing exercises as tolerated Refer for new dexa now

## 2013-02-26 NOTE — Progress Notes (Signed)
Subjective:    Patient ID: [name], female    DOB: 11-27-1950, 63 y.o.   MRN: [id number]  HPI  patient is here today for annual physical.  Also reviewed chronic medical concerns and interval medical events:  Past Medical History  Diagnosis Date  . Dyslipidemia   . Depression   . Osteopenia   . Incisional hernia   . Stress incontinence, female   . Asthma   . ADD (attention deficit disorder)    Family History  Problem Relation Age of Onset  . Arthritis Mother   . Heart disease Mother     CABG @ 93  . Diabetes Mother   . Osteoporosis Mother   . Arthritis Father   . Lymphoma Father 58    chemo  . Cancer Father     lymphoma  . Lymphoma Paternal Grandmother   . Diabetes Maternal Grandmother    History  Substance Use Topics  . Smoking status: Never Smoker   . Smokeless tobacco: Not on file  . Alcohol Use: Yes     Comment: social    Review of Systems  Constitutional: Negative for fatigue and unexpected weight change.  Respiratory: Negative for cough, shortness of breath and wheezing.   Cardiovascular: Negative for chest pain, palpitations and leg swelling.  Gastrointestinal: Negative for nausea, abdominal pain and diarrhea.  Genitourinary: Positive for frequency, difficulty urinating and dyspareunia. Negative for hematuria, decreased urine volume, menstrual problem and pelvic pain.  Neurological: Negative for dizziness, weakness, light-headedness and headaches.  Psychiatric/Behavioral: Negative for confusion and dysphoric mood. The patient is not nervous/anxious.   All other systems reviewed and are negative.       Objective:   Physical Exam BP 120/92  Pulse 88  Temp(Src) 98.8 F (37.1 C) (Oral)  Ht 5' 1.5" (1.562 m)  Wt 173 lb 6.4 oz (78.654 kg)  BMI 32.24 kg/m2  SpO2 95%  LMP 08/30/2008 Wt Readings from Last 3 Encounters:  02/26/13 173 lb 6.4 oz (78.654 kg)  12/16/12 168 lb (76.204 kg)  07/26/12 174 lb 1.3 oz (78.962 kg)    Constitutional: She is  overweight, but appears well-developed and well-nourished. No distress.  HENT: Head: Normocephalic and atraumatic. Ears: B TMs ok, no erythema or effusion; Nose: Nose normal. Mouth/Throat: Oropharynx is clear and moist. No oropharyngeal exudate.  Eyes: Conjunctivae and EOM are normal. Pupils are equal, round, and reactive to light. No scleral icterus.  Neck: Normal range of motion. Neck supple. No JVD present. No thyromegaly present.  Cardiovascular: Normal rate, regular rhythm and normal heart sounds.  No murmur heard. No BLE edema. Pulmonary/Chest: Effort normal and breath sounds normal. No respiratory distress. She has no wheezes.  Abdominal: soft left greater than right midline incisional hernia. Nontender. Bowel sounds are normal. She exhibits no distension. There is no tenderness. no masses GU: defer to gyn Musculoskeletal: Normal range of motion, no joint effusions. No gross deformities Neurological: She is alert and oriented to person, place, and time. No cranial nerve deficit. Coordination, balance, strength, speech and gait are normal.  Skin: various solar spots, skin tags and chronic noninflamed sebaceous cyst on the trunk and extremities. No abnormal changes observed. Skin is warm and dry. No rash noted. No erythema.  Psychiatric: She has a expansive but baseline mood and affect. Her behavior is normal. Judgment and thought content normal.   Lab Results  Component Value Date   WBC 8.6 12/16/2012   HGB 15.1* 12/16/2012   HCT 45.0 12/16/2012   PLT  339.0 12/16/2012   GLUCOSE 102* 12/22/2011   CHOL 171 12/22/2011   TRIG 79.0 12/22/2011   HDL 63.70 12/22/2011   LDLCALC 92 12/22/2011   ALT 26 12/22/2011   AST 24 12/22/2011   NA 139 12/22/2011   K 5.5* 12/22/2011   CL 103 12/22/2011   CREATININE 1.1 12/22/2011   BUN 11 12/22/2011   CO2 29 12/22/2011   TSH 1.64 12/22/2011      Assessment & Plan:   CPX/v70.0 - Patient has been counseled on age-appropriate routine health  concerns for screening and prevention. These are reviewed and up-to-date. Immunizations are up-to-date or declined. Labs ordered and reviewed.  Skin tags and change in moles - refer to derm at pt request  Also See problem list. Medications and labs reviewed today.

## 2013-02-26 NOTE — Assessment & Plan Note (Signed)
On statin therapy since 2007, denies secondary disease such as peripheral vascular disease, heart disease or stroke Tolerating medication without adverse side effects. Check annual lipids and LFTs

## 2013-02-26 NOTE — Progress Notes (Signed)
Pre-visit discussion using our clinic review tool. No additional management support is needed unless otherwise documented below in the visit note.  

## 2013-02-26 NOTE — Patient Instructions (Addendum)
It was good to see you today.  We have reviewed your prior records including labs and tests today  Health Maintenance reviewed - consider pneumonia vaccination now because of your coexisting asthma -all other recommended immunizations and age-appropriate screenings are up-to-date.  Test(s) ordered today. Ok to try to have these done at [facility] office - or return here one AM at 730. Your results will be released to [place] (or called to you) after review, usually within 72hours after test completion. If any changes need to be made, you will be notified at that same time.  We'll also schedule bone density to update status of osteopenia. You'll be contacted with results after review  Medications reviewed and updated, no changes recommended at this time.  we'll make referral to gynecologist and to dermatologist as discussed. Our office will contact you regarding appointment(s) once made.  Call Dr. Ron Parker as discussed for evaluation of potential TMJ intervention  Please schedule followup in 12 months for annual exam and labs, call sooner if problems.  Health Maintenance, Female A healthy lifestyle and preventative care can promote health and wellness.  Maintain regular health, dental, and eye exams.  Eat a healthy diet. Foods like vegetables, fruits, whole grains, low-fat dairy products, and lean protein foods contain the nutrients you need without too many calories. Decrease your intake of foods high in solid fats, added sugars, and salt. Get information about a proper diet from your caregiver, if necessary.  Regular physical exercise is one of the most important things you can do for your health. Most adults should get at least 150 minutes of moderate-intensity exercise (any activity that increases your heart rate and causes you to sweat) each week. In addition, most adults need muscle-strengthening exercises on 2 or more days a week.   Maintain a healthy weight. The body mass index (BMI) is  a screening tool to identify possible weight problems. It provides an estimate of body fat based on height and weight. Your caregiver can help determine your BMI, and can help you achieve or maintain a healthy weight. For adults 20 years and older:  A BMI below 18.5 is considered underweight.  A BMI of 18.5 to 24.9 is normal.  A BMI of 25 to 29.9 is considered overweight.  A BMI of 30 and above is considered obese.  Maintain normal blood lipids and cholesterol by exercising and minimizing your intake of saturated fat. Eat a balanced diet with plenty of fruits and vegetables. Blood tests for lipids and cholesterol should begin at age 35 and be repeated every 5 years. If your lipid or cholesterol levels are high, you are over 50, or you are a high risk for heart disease, you may need your cholesterol levels checked more frequently.Ongoing high lipid and cholesterol levels should be treated with medicines if diet and exercise are not effective.  If you smoke, find out from your caregiver how to quit. If you do not use tobacco, do not start.  Lung cancer screening is recommended for adults aged 6 80 years who are at high risk for developing lung cancer because of a history of smoking. Yearly low-dose computed tomography (CT) is recommended for people who have at least a 30-pack-year history of smoking and are a current smoker or have quit within the past 15 years. A pack year of smoking is smoking an average of 1 pack of cigarettes a day for 1 year (for example: 1 pack a day for 30 years or 2 packs a  day for 15 years). Yearly screening should continue until the smoker has stopped smoking for at least 15 years. Yearly screening should also be stopped for people who develop a health problem that would prevent them from having lung cancer treatment.  If you are pregnant, do not drink alcohol. If you are breastfeeding, be very cautious about drinking alcohol. If you are not pregnant and choose to drink  alcohol, do not exceed 1 drink per day. One drink is considered to be 12 ounces (355 mL) of beer, 5 ounces (148 mL) of wine, or 1.5 ounces (44 mL) of liquor.  Avoid use of street drugs. Do not share needles with anyone. Ask for help if you need support or instructions about stopping the use of drugs.  High blood pressure causes heart disease and increases the risk of stroke. Blood pressure should be checked at least every 1 to 2 years. Ongoing high blood pressure should be treated with medicines, if weight loss and exercise are not effective.  If you are 38 to 63 years old, ask your caregiver if you should take aspirin to prevent strokes.  Diabetes screening involves taking a blood sample to check your fasting blood sugar level. This should be done once every 3 years, after age 42, if you are within normal weight and without risk factors for diabetes. Testing should be considered at a younger age or be carried out more frequently if you are overweight and have at least 1 risk factor for diabetes.  Breast cancer screening is essential preventative care for women. You should practice "breast self-awareness." This means understanding the normal appearance and feel of your breasts and may include breast self-examination. Any changes detected, no matter how small, should be reported to a caregiver. Women in their 68s and 30s should have a clinical breast exam (CBE) by a caregiver as part of a regular health exam every 1 to 3 years. After age 64, women should have a CBE every year. Starting at age 27, women should consider having a mammogram (breast X-ray) every year. Women who have a family history of breast cancer should talk to their caregiver about genetic screening. Women at a high risk of breast cancer should talk to their caregiver about having an MRI and a mammogram every year.  Breast cancer gene (BRCA)-related cancer risk assessment is recommended for women who have family members with BRCA-related  cancers. BRCA-related cancers include breast, ovarian, tubal, and peritoneal cancers. Having family members with these cancers may be associated with an increased risk for harmful changes (mutations) in the breast cancer genes BRCA1 and BRCA2. Results of the assessment will determine the need for genetic counseling and BRCA1 and BRCA2 testing.  The Pap test is a screening test for cervical cancer. Women should have a Pap test starting at age 28. Between ages 47 and 13, Pap tests should be repeated every 2 years. Beginning at age 109, you should have a Pap test every 3 years as long as the past 3 Pap tests have been normal. If you had a hysterectomy for a problem that was not cancer or a condition that could lead to cancer, then you no longer need Pap tests. If you are between ages 74 and 82, and you have had normal Pap tests going back 10 years, you no longer need Pap tests. If you have had past treatment for cervical cancer or a condition that could lead to cancer, you need Pap tests and screening for cancer for at least  20 years after your treatment. If Pap tests have been discontinued, risk factors (such as a new sexual partner) need to be reassessed to determine if screening should be resumed. Some women have medical problems that increase the chance of getting cervical cancer. In these cases, your caregiver may recommend more frequent screening and Pap tests.  The human papillomavirus (HPV) test is an additional test that may be used for cervical cancer screening. The HPV test looks for the virus that can cause the cell changes on the cervix. The cells collected during the Pap test can be tested for HPV. The HPV test could be used to screen women aged 80 years and older, and should be used in women of any age who have unclear Pap test results. After the age of 35, women should have HPV testing at the same frequency as a Pap test.  Colorectal cancer can be detected and often prevented. Most routine  colorectal cancer screening begins at the age of 24 and continues through age 62. However, your caregiver may recommend screening at an earlier age if you have risk factors for colon cancer. On a yearly basis, your caregiver may provide home test kits to check for hidden blood in the stool. Use of a small camera at the end of a tube, to directly examine the colon (sigmoidoscopy or colonoscopy), can detect the earliest forms of colorectal cancer. Talk to your caregiver about this at age 6, when routine screening begins. Direct examination of the colon should be repeated every 5 to 10 years through age 16, unless early forms of pre-cancerous polyps or small growths are found.  Hepatitis C blood testing is recommended for all people born from [birth year] through 1965 and any individual with known risks for hepatitis C.  Practice safe sex. Use condoms and avoid high-risk sexual practices to reduce the spread of sexually transmitted infections (STIs). Sexually active women aged 21 and younger should be checked for Chlamydia, which is a common sexually transmitted infection. Older women with new or multiple partners should also be tested for Chlamydia. Testing for other STIs is recommended if you are sexually active and at increased risk.  Osteoporosis is a disease in which the bones lose minerals and strength with aging. This can result in serious bone fractures. The risk of osteoporosis can be identified using a bone density scan. Women ages 22 and over and women at risk for fractures or osteoporosis should discuss screening with their caregivers. Ask your caregiver whether you should be taking a calcium supplement or vitamin D to reduce the rate of osteoporosis.  Menopause can be associated with physical symptoms and risks. Hormone replacement therapy is available to decrease symptoms and risks. You should talk to your caregiver about whether hormone replacement therapy is right for you.  Use sunscreen. Apply  sunscreen liberally and repeatedly throughout the day. You should seek shade when your shadow is shorter than you. Protect yourself by wearing long sleeves, pants, a wide-brimmed hat, and sunglasses year round, whenever you are outdoors.  Notify your caregiver of new moles or changes in moles, especially if there is a change in shape or color. Also notify your caregiver if a mole is larger than the size of a pencil eraser.  Stay current with your immunizations. Document Released: 08/01/2010 Document Revised: 05/13/2012 Document Reviewed: 08/01/2010 [facility] Patient Information 2014 [place].

## 2013-02-26 NOTE — Assessment & Plan Note (Signed)
Education on same and reassurance provided Related to remote ulcer repair

## 2013-02-26 NOTE — Assessment & Plan Note (Signed)
Prior sling repair for same symptoms 2004, Given recurrence, referred to GYN to evaluate this and Pap/pelvic given atrophy symptoms with post coital bleeding. Discussed topical Premarin or estrogen therapy which patient declined

## 2013-03-19 ENCOUNTER — Other Ambulatory Visit (INDEPENDENT_AMBULATORY_CARE_PROVIDER_SITE_OTHER): Payer: BC Managed Care – PPO

## 2013-03-19 DIAGNOSIS — Z0000 Encounter for general adult medical examination without abnormal findings: Secondary | ICD-10-CM

## 2013-03-19 LAB — URINALYSIS, ROUTINE W REFLEX MICROSCOPIC
Bilirubin Urine: NEGATIVE
HGB URINE DIPSTICK: NEGATIVE
Ketones, ur: NEGATIVE
LEUKOCYTES UA: NEGATIVE
NITRITE: NEGATIVE
Specific Gravity, Urine: >=1.03 — AB (ref 1.000–1.030)
Total Protein, Urine: NEGATIVE
URINE GLUCOSE: NEGATIVE
UROBILINOGEN UA: 0.2 (ref 0.0–1.0)
pH: 6 (ref 5.0–8.0)

## 2013-03-19 LAB — HEPATIC FUNCTION PANEL
ALK PHOS: 61 U/L (ref 39–117)
ALT: 34 U/L (ref 0–35)
AST: 27 U/L (ref 0–37)
Albumin: 4.1 g/dL (ref 3.5–5.2)
BILIRUBIN DIRECT: 0 mg/dL (ref 0.0–0.3)
Total Bilirubin: 0.4 mg/dL (ref 0.3–1.2)
Total Protein: 7.1 g/dL (ref 6.0–8.3)

## 2013-03-19 LAB — TSH: TSH: 1.38 u[IU]/mL (ref 0.35–5.50)

## 2013-03-19 LAB — CBC WITH DIFFERENTIAL/PLATELET
Basophils Absolute: 0 10*3/uL (ref 0.0–0.1)
Basophils Relative: 0.5 % (ref 0.0–3.0)
EOS ABS: 0.1 10*3/uL (ref 0.0–0.7)
Eosinophils Relative: 2.2 % (ref 0.0–5.0)
HCT: 44.3 % (ref 36.0–46.0)
Hemoglobin: 14.3 g/dL (ref 12.0–15.0)
LYMPHS PCT: 37.5 % (ref 12.0–46.0)
Lymphs Abs: 2.4 10*3/uL (ref 0.7–4.0)
MCHC: 32.3 g/dL (ref 30.0–36.0)
MCV: 92.6 fl (ref 78.0–100.0)
Monocytes Absolute: 0.4 10*3/uL (ref 0.1–1.0)
Monocytes Relative: 5.8 % (ref 3.0–12.0)
NEUTROS PCT: 54 % (ref 43.0–77.0)
Neutro Abs: 3.5 10*3/uL (ref 1.4–7.7)
Platelets: 328 10*3/uL (ref 150.0–400.0)
RBC: 4.79 Mil/uL (ref 3.87–5.11)
RDW: 13.9 % (ref 11.5–14.6)
WBC: 6.5 10*3/uL (ref 4.5–10.5)

## 2013-03-19 LAB — BASIC METABOLIC PANEL
BUN: 16 mg/dL (ref 6–23)
CALCIUM: 9.3 mg/dL (ref 8.4–10.5)
CO2: 28 mEq/L (ref 19–32)
CREATININE: 0.8 mg/dL (ref 0.4–1.2)
Chloride: 103 mEq/L (ref 96–112)
GFR: 74.97 mL/min (ref >60.00)
Glucose, Bld: 90 mg/dL (ref 70–99)
Potassium: 4.1 mEq/L (ref 3.5–5.1)
Sodium: 138 mEq/L (ref 135–145)

## 2013-03-19 LAB — LDL CHOLESTEROL, DIRECT: LDL DIRECT: 130 mg/dL

## 2013-03-19 LAB — LIPID PANEL
CHOLESTEROL: 218 mg/dL — AB (ref 0–200)
HDL: 63.5 mg/dL (ref >39.00)
Total CHOL/HDL Ratio: 3
Triglycerides: 145 mg/dL (ref 0.0–149.0)
VLDL: 29 mg/dL (ref 0.0–40.0)

## 2013-04-13 ENCOUNTER — Other Ambulatory Visit: Payer: Self-pay | Admitting: Internal Medicine

## 2013-04-17 ENCOUNTER — Telehealth: Payer: Self-pay

## 2013-04-17 MED ORDER — METHYLPHENIDATE HCL 20 MG PO TABS: 20.0000 mg | ORAL_TABLET | Freq: Three times a day (TID) | ORAL | Status: DC

## 2013-04-17 NOTE — Telephone Encounter (Signed)
Done hardcopy to robin  

## 2013-04-17 NOTE — Telephone Encounter (Signed)
MD out of office pls advise on refill.../lmb 

## 2013-04-17 NOTE — Telephone Encounter (Signed)
The patient called and is hoping to get a refill of her methylphemidate 20mg   Callback - [phone]

## 2013-04-18 NOTE — Telephone Encounter (Signed)
Called left detailed message hardcopy is ready for pickup at the front desk.

## 2013-06-02 ENCOUNTER — Telehealth: Payer: Self-pay | Admitting: *Deleted

## 2013-06-02 NOTE — Telephone Encounter (Signed)
Pt called states she was seen at a [facility] over a week ago with  Cough and productive nasal drainage.  She states she is not getting any better.  Please advise

## 2013-06-03 NOTE — Telephone Encounter (Signed)
i need more information on her visit (not in epic) - what was rx'd, what meds/otc pt is taking now... If uncertain, please schedule an acute w/ one of my partners to reeval this problem

## 2013-06-03 NOTE — Telephone Encounter (Signed)
Unable to contact pt, no answer and no VM 

## 2013-06-04 ENCOUNTER — Encounter: Payer: Self-pay | Admitting: *Deleted

## 2013-06-04 ENCOUNTER — Telehealth: Payer: Self-pay | Admitting: *Deleted

## 2013-06-04 NOTE — Telephone Encounter (Signed)
Pt called into Clinic, very belligerent and rude for approximately 20 minutes.  I attempted to assist her but she would not allow me too.  She states she has called and spoke with several people in the clinic and has not received a return call.  I tried to tell her that I attempted to call her on yesterday with no answer and no VM, she said I was not telling the truth.  Prior to reaching me today she spoke with Emelda Fear, registrar, and demanded to speak with the Office Manager, after Hoyle Sauer attempted to assist her she refused to give her name and DOB.  Stated "I don't have the patience for that".  She was upset that she was transferred to Lou's VM.

## 2013-06-04 NOTE — Telephone Encounter (Signed)
Agree as documented -  recurrent disrespectful and verbally abusive behavior towards staff Proceed with dismissal from practice because of same

## 2013-06-04 NOTE — Telephone Encounter (Signed)
MD has completed dismissal letter " abusive behavior towards staff on multiple occassions". Given to office manager (lou) to finish dismissal process...Johny Chess

## 2013-06-05 ENCOUNTER — Ambulatory Visit (INDEPENDENT_AMBULATORY_CARE_PROVIDER_SITE_OTHER): Payer: BC Managed Care – PPO | Admitting: Nurse Practitioner

## 2013-06-05 ENCOUNTER — Encounter: Payer: Self-pay | Admitting: Nurse Practitioner

## 2013-06-05 VITALS — BP 113/79 | HR 81 | Temp 98.0°F | Ht 61.5 in | Wt 177.0 lb

## 2013-06-05 DIAGNOSIS — J019 Acute sinusitis, unspecified: Secondary | ICD-10-CM

## 2013-06-05 MED ORDER — AMOXICILLIN-POT CLAVULANATE 875-125 MG PO TABS: 1.0000 | ORAL_TABLET | Freq: Two times a day (BID) | ORAL | Status: DC

## 2013-06-05 NOTE — Progress Notes (Signed)
   Subjective:    Patient ID: [name], female    DOB: 1950-09-11, 63 y.o.   MRN: [id number]  Cough This is a new problem. The current episode started 1 to 4 weeks ago (2 weeks). The problem has been gradually worsening. The problem occurs every few minutes. The cough is productive of sputum. Associated symptoms include ear congestion, headaches, nasal congestion and postnasal drip. Pertinent negatives include no chest pain, eye redness, fever, sore throat, shortness of breath or wheezing. Associated symptoms comments: Cough . Nothing aggravates the symptoms. Treatments tried: tessalon pearles,sinus rinse, nyquil. Her past medical history is significant for asthma. recurrent sinusitis w/sinus surgery8 mos ago: L ethmoidectomy, L maxillary osteal enlargement, polyp removal, turbinate reduction      Review of Systems  Constitutional: Negative for fever.  HENT: Positive for congestion, postnasal drip, sinus pressure and voice change (nasal). Negative for sore throat.   Eyes: Negative for pain and redness.  Respiratory: Positive for cough. Negative for chest tightness, shortness of breath and wheezing.   Cardiovascular: Negative for chest pain.  Neurological: Positive for headaches.  Hematological: Negative for adenopathy.       Objective:   Physical Exam  Vitals reviewed. Constitutional: She is oriented to person, place, and time. She appears well-developed and well-nourished. No distress.  HENT:  Head: Normocephalic and atraumatic.  Right Ear: External ear normal.  Left Ear: External ear normal.  Post nasal drip-yellow. Pale nasal mucosa. L TM fluid, clear. Bones visible bilat.   Eyes: Conjunctivae are normal. Right eye exhibits no discharge. Left eye exhibits no discharge.  Neck: Normal range of motion. Neck supple. No thyromegaly present.  Cardiovascular: Normal rate, regular rhythm and normal heart sounds.   No murmur heard. Pulmonary/Chest: Effort normal. No respiratory  distress. She has no wheezes. She has rales.  Few rales w/forced expiration  Lymphadenopathy:    She has no cervical adenopathy.  Neurological: She is alert and oriented to person, place, and time.  Skin: Skin is warm and dry.  Psychiatric: She has a normal mood and affect. Her behavior is normal. Thought content normal.          Assessment & Plan:  1. Acute sinus infection Hx sinus surgery-Dr Ernesto Rutherford. Notes reviewed. - amoxicillin-clavulanate (AUGMENTIN) 875-125 MG per tablet; Take 1 tablet by mouth 2 (two) times daily.  Dispense: 14 tablet; Refill: 0 Daily sinus rinseSee pt instructions F/u w/Dr Ernesto Rutherford if not better.

## 2013-06-05 NOTE — Patient Instructions (Signed)
Start antibiotic. Eat yogurt daily at lunch or afternoon to help prevent diarrhea that can be caused by antibiotic. Start daily sinus rinses (Neilmed Sinus rinse) for at least 5-7 days. Please see Dr Ernesto Rutherford if you are not better in 2 weeks.   Sinusitis Sinusitis is redness, soreness, and swelling (inflammation) of the paranasal sinuses. Paranasal sinuses are air pockets within the bones of your face (beneath the eyes, the middle of the forehead, or above the eyes). In healthy paranasal sinuses, mucus is able to drain out, and air is able to circulate through them by way of your nose. However, when your paranasal sinuses are inflamed, mucus and air can become trapped. This can allow bacteria and other germs to grow and cause infection. Sinusitis can develop quickly and last only a short time (acute) or continue over a long period (chronic). Sinusitis that lasts for more than 12 weeks is considered chronic.  CAUSES  Causes of sinusitis include:  Allergies.  Structural abnormalities, such as displacement of the cartilage that separates your nostrils (deviated septum), which can decrease the air flow through your nose and sinuses and affect sinus drainage.  Functional abnormalities, such as when the small hairs (cilia) that line your sinuses and help remove mucus do not work properly or are not present. SYMPTOMS  Symptoms of acute and chronic sinusitis are the same. The primary symptoms are pain and pressure around the affected sinuses. Other symptoms include:  Upper toothache.  Earache.  Headache.  Bad breath.  Decreased sense of smell and taste.  A cough, which worsens when you are lying flat.  Fatigue.  Fever.  Thick drainage from your nose, which often is green and may contain pus (purulent).  Swelling and warmth over the affected sinuses. DIAGNOSIS  Your caregiver will perform a physical exam. During the exam, your caregiver may:  Look in your nose for signs of abnormal  growths in your nostrils (nasal polyps).  Tap over the affected sinus to check for signs of infection.  View the inside of your sinuses (endoscopy) with a special imaging device with a light attached (endoscope), which is inserted into your sinuses. If your caregiver suspects that you have chronic sinusitis, one or more of the following tests may be recommended:  Allergy tests.  Nasal culture A sample of mucus is taken from your nose and sent to a lab and screened for bacteria.  Nasal cytology A sample of mucus is taken from your nose and examined by your caregiver to determine if your sinusitis is related to an allergy. TREATMENT  Most cases of acute sinusitis are related to a viral infection and will resolve on their own within 10 days. Sometimes medicines are prescribed to help relieve symptoms (pain medicine, decongestants, nasal steroid sprays, or saline sprays).  However, for sinusitis related to a bacterial infection, your caregiver will prescribe antibiotic medicines. These are medicines that will help kill the bacteria causing the infection.  Rarely, sinusitis is caused by a fungal infection. In theses cases, your caregiver will prescribe antifungal medicine. For some cases of chronic sinusitis, surgery is needed. Generally, these are cases in which sinusitis recurs more than 3 times per year, despite other treatments. HOME CARE INSTRUCTIONS   Drink plenty of water. Water helps thin the mucus so your sinuses can drain more easily.  Use a humidifier.  Inhale steam 3 to 4 times a day (for example, sit in the bathroom with the shower running).  Apply a warm, moist washcloth to your  face 3 to 4 times a day, or as directed by your caregiver.  Use saline nasal sprays to help moisten and clean your sinuses.  Take over-the-counter or prescription medicines for pain, discomfort, or fever only as directed by your caregiver. SEEK IMMEDIATE MEDICAL CARE IF:  You have increasing pain or  severe headaches.  You have nausea, vomiting, or drowsiness.  You have swelling around your face.  You have vision problems.  You have a stiff neck.  You have difficulty breathing. MAKE SURE YOU:   Understand these instructions.  Will watch your condition.  Will get help right away if you are not doing well or get worse. Document Released: 01/16/2005 Document Revised: 04/10/2011 Document Reviewed: 01/31/2011 [facility] Patient Information 2014 [place], [place].

## 2013-06-05 NOTE — Progress Notes (Signed)
Pre visit review using our clinic review tool, if applicable. No additional management support is needed unless otherwise documented below in the visit note. 

## 2013-06-10 ENCOUNTER — Telehealth: Payer: Self-pay

## 2013-06-10 ENCOUNTER — Other Ambulatory Visit: Payer: Self-pay | Admitting: Internal Medicine

## 2013-06-10 ENCOUNTER — Other Ambulatory Visit: Payer: Self-pay | Admitting: Nurse Practitioner

## 2013-06-10 DIAGNOSIS — R05 Cough: Secondary | ICD-10-CM

## 2013-06-10 DIAGNOSIS — R059 Cough, unspecified: Secondary | ICD-10-CM

## 2013-06-10 MED ORDER — BENZONATATE 100 MG PO CAPS: ORAL_CAPSULE | ORAL | Status: DC

## 2013-06-10 NOTE — Telephone Encounter (Signed)
Spoke with pt, she would like a refill on Tessalon Pearles 200 mg. Please advise

## 2013-06-10 NOTE — Telephone Encounter (Signed)
Done

## 2013-06-17 ENCOUNTER — Telehealth: Payer: Self-pay | Admitting: Internal Medicine

## 2013-06-17 NOTE — Telephone Encounter (Signed)
Dismissal Letter sent by [employer] [phone]  Received the Return Receipt showing someone picked up the Dismissal 06/24/2013

## 2013-07-09 ENCOUNTER — Telehealth: Payer: Self-pay | Admitting: Internal Medicine

## 2013-07-09 MED ORDER — METHYLPHENIDATE HCL 20 MG PO TABS: 20.0000 mg | ORAL_TABLET | Freq: Three times a day (TID) | ORAL | Status: DC

## 2013-07-09 NOTE — Telephone Encounter (Signed)
Patient is unable to get an appt with her new PCP before her RX expires.  Can she have one more refill on her Ritalin?

## 2013-07-09 NOTE — Telephone Encounter (Signed)
Last one from me, but yes, ok

## 2013-07-10 NOTE — Telephone Encounter (Signed)
Called pt no answer LMOM with md response. Place rx up front for pick-up...[name]

## 2013-07-25 ENCOUNTER — Telehealth: Payer: Self-pay | Admitting: Internal Medicine

## 2013-07-25 NOTE — Telephone Encounter (Signed)
Pt call stated that methylphenidate need an authorization due to last one expired in March 2015. Please advise, pt give phone number to [facility] [phone]. Please advise.

## 2013-07-28 NOTE — Telephone Encounter (Signed)
I have form and will complete same today

## 2013-07-29 ENCOUNTER — Ambulatory Visit (INDEPENDENT_AMBULATORY_CARE_PROVIDER_SITE_OTHER): Payer: BC Managed Care – PPO | Admitting: Family Medicine

## 2013-07-29 ENCOUNTER — Encounter: Payer: Self-pay | Admitting: Family Medicine

## 2013-07-29 VITALS — BP 110/78 | HR 89 | Temp 99.2°F | Resp 16 | Ht 62.0 in | Wt 170.2 lb

## 2013-07-29 DIAGNOSIS — M8580 Other specified disorders of bone density and structure, unspecified site: Secondary | ICD-10-CM

## 2013-07-29 DIAGNOSIS — M949 Disorder of cartilage, unspecified: Secondary | ICD-10-CM

## 2013-07-29 DIAGNOSIS — F988 Other specified behavioral and emotional disorders with onset usually occurring in childhood and adolescence: Secondary | ICD-10-CM

## 2013-07-29 DIAGNOSIS — J4520 Mild intermittent asthma, uncomplicated: Secondary | ICD-10-CM

## 2013-07-29 DIAGNOSIS — E785 Hyperlipidemia, unspecified: Secondary | ICD-10-CM

## 2013-07-29 DIAGNOSIS — M899 Disorder of bone, unspecified: Secondary | ICD-10-CM

## 2013-07-29 DIAGNOSIS — J45909 Unspecified asthma, uncomplicated: Secondary | ICD-10-CM

## 2013-07-29 LAB — LIPID PANEL
CHOL/HDL RATIO: 2.7 ratio
CHOLESTEROL: 175 mg/dL (ref 0–200)
HDL: 66 mg/dL (ref >39)
LDL CALC: 91 mg/dL (ref 0–99)
TRIGLYCERIDES: 92 mg/dL (ref <150)
VLDL: 18 mg/dL (ref 0–40)

## 2013-07-29 MED ORDER — METHYLPHENIDATE HCL 20 MG PO TABS: 20.0000 mg | ORAL_TABLET | Freq: Three times a day (TID) | ORAL | Status: DC

## 2013-07-29 NOTE — Patient Instructions (Addendum)
Your Methylphenidate has been authorized for 1 year. I have provided 3 prescriptions for the next 3 months. Your next appointment with me is in 3 months.

## 2013-07-29 NOTE — Progress Notes (Signed)
Subjective:    Patient ID: [name], female    DOB: 08/18/1950, 63 y.o.   MRN: [id number]  HPI  This 63 y.o. Cauc female is new to [facility] and is here to establish care w/ new PCP. Previous care w/ Dr. Asa Lente @ [facility] documented in EPIC. Pt reports she was dismissed from that practice because of rude behavior toward staff; she disagrees with this and spends several minutes reviewing what happened. Pt revisited this issue a few times during today's encounter.  Chronic sinusitis and associated symptoms have mostly resolved after sinus surgery last year. She uses allergy medication and a steroid nasal spray. Asthma symptoms are triggered by allergens and strong odors.   Depression is treated by Dr. Sabra Heck; recent medication change from Wellbutrin to Cymbalta. Pt receives some counseling at [facility]. 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   HCM: CPE performed in Jan 2015.; PAP- Feb 2013 (negative). Pt is requesting a CPE/PAP so that she can get back on her schedule of annual exams in September. (I have declined this request) reminding her several times that she had a CPE 6 months ago. She would like to have DEXA within the next 6 months.  Patient Active Problem List   Diagnosis Date Noted  . Sinusitis, chronic   . Asthma 01/08/2012  . Dyslipidemia   . ADD (attention deficit disorder)   . Depression   . Osteopenia   . Incisional hernia   . Stress incontinence, female    Prior to Admission medications   Medication Sig Start Date End Date Taking? Authorizing Provider  DULoxetine (CYMBALTA) 60 MG capsule Take 60 mg by mouth daily.   Yes Historical Provider, MD  Fluticasone-Salmeterol (ADVAIR) 250-50 MCG/DOSE AEPB Inhale 1 puff into the lungs 2 (two) times daily as needed. 01/08/12  Yes Rowe Clack, MD  loratadine  (CLARITIN) 10 MG tablet Take 10 mg by mouth daily.   Yes Historical Provider, MD  methylphenidate (RITALIN) 20 MG tablet Take 1 tablet (20 mg total) by mouth 3 (three) times daily.   Yes   mometasone (NASONEX) 50 MCG/ACT nasal spray Place 2 sprays into the nose daily.   Yes Historical Provider, MD  Multiple Vitamin (MULTIVITAMIN) tablet Take 1 tablet by mouth daily.     Yes Historical Provider, MD  OVER THE COUNTER MEDICATION Tylenol 650 mg 2 tabs daily   Yes Historical Provider, MD  OVER THE COUNTER MEDICATION OTC Magnesium Complex 2 caps daily with food   Yes Historical Provider, MD  OVER THE COUNTER MEDICATION New Chapter Bone Strength taking 2-3 tabs daily with food   Yes Historical Provider, MD  simvastatin (ZOCOR) 40 MG tablet TAKE 1 TABLET BY MOUTH AT BEDTIME 05/24/12  Yes Rowe Clack, MD  benzonatate (TESSALON) 200 MG capsule TAKE 1 CAPSULE 3 TIMES A DAY AS NEEDED COUGH 06/10/13   Rowe Clack, MD   PMHx, Surg Hx, Soc nad Fam Hx reviewed.   Review of Systems  Constitutional: Negative.   HENT: Positive for congestion and sinus pressure.   Eyes: Negative.   Respiratory: Negative.   Cardiovascular: Negative.   Gastrointestinal: Negative.   Endocrine: Negative.   Musculoskeletal: Negative.   Skin: Negative.   Allergic/Immunologic: Positive for environmental allergies.  Neurological: Negative.   Hematological: Negative.   Psychiatric/Behavioral: Positive for dysphoric mood and  decreased concentration. Negative for sleep disturbance, self-injury and agitation. The patient is not nervous/anxious.        Depression treated by Dr. Sabra Heck.      Objective:   Physical Exam  Nursing note and vitals reviewed. Constitutional: She is oriented to person, place, and time. Vital signs are normal. She appears well-developed and well-nourished. No distress.  HENT:  Head: Normocephalic and atraumatic.  Right Ear: Hearing, tympanic membrane, external ear and ear canal normal.  Left  Ear: Hearing, tympanic membrane, external ear and ear canal normal.  Nose: Nose normal. No nasal deformity or septal deviation.  Mouth/Throat: Uvula is midline, oropharynx is clear and moist and mucous membranes are normal. Normal dentition. No lacerations or dental caries.  Eyes: EOM and lids are normal. Pupils are equal, round, and reactive to light. No scleral icterus.  Neck: Normal range of motion and full passive range of motion without pain. Neck supple. No spinous process tenderness and no muscular tenderness present. No mass and no thyromegaly present.  Cardiovascular: Normal rate, regular rhythm, S1 normal, S2 normal and normal heart sounds.   No extrasystoles are present. PMI is not displaced.  Exam reveals no gallop and no friction rub.   No murmur heard. Pulmonary/Chest: Effort normal and breath sounds normal. No respiratory distress.  Abdominal: Soft. Normal appearance and bowel sounds are normal. She exhibits no distension, no pulsatile midline mass and no mass. There is no hepatosplenomegaly. There is no guarding and no CVA tenderness.  Musculoskeletal: Normal range of motion. She exhibits no edema and no tenderness.  Neurological: She is alert and oriented to person, place, and time. She has normal strength and normal reflexes. She displays no tremor. No cranial nerve deficit or sensory deficit. She exhibits normal muscle tone. Coordination and gait normal.  Skin: Skin is warm, dry and intact. No ecchymosis and no rash noted. She is not diaphoretic. No cyanosis or erythema. No pallor. Nails show no clubbing.  Psychiatric: She has a normal mood and affect. Her behavior is normal. Judgment and thought content normal. Her speech is tangential. Her speech is not rapid and/or pressured. Cognition and memory are normal.  Pt required redirecting throughout the visit.      Assessment & Plan:  ADD (attention deficit disorder) - Phone call for Prior Authorization necessary to get  Methylphenidate authorized. Pt given prescriptions for medication for 3 months. Will need to follow-up in 3 months for additional refills. Plan: Vitamin D, 25-hydroxy  Asthma, mild intermittent, uncomplicated- Stable on current medications.  Osteopenia - Plan: Vitamin D, 25-hydroxy  Dyslipidemia - Plan: Lipid panel, Thyroid Panel With TSH  Pt cautioned about expectations re: pt behavior, rudeness to staff, etc (I reviewed the telephone notes- which are numerous- about this pt's interactions with staff at previous PCP's office).

## 2013-07-29 NOTE — Telephone Encounter (Signed)
PA form has been fax waiting on approval status...[name]

## 2013-07-30 ENCOUNTER — Encounter: Payer: Self-pay | Admitting: Family Medicine

## 2013-07-30 ENCOUNTER — Telehealth: Payer: Self-pay

## 2013-07-30 LAB — THYROID PANEL WITH TSH
FREE THYROXINE INDEX: 3.1 (ref 1.0–3.9)
T3 Uptake: 33 % (ref 22.5–37.0)
T4, Total: 9.4 ug/dL (ref 5.0–12.5)
TSH: 1.057 u[IU]/mL (ref 0.350–4.500)

## 2013-07-30 LAB — VITAMIN D 25 HYDROXY (VIT D DEFICIENCY, FRACTURES): Vit D, 25-Hydroxy: 41 ng/mL (ref 30–89)

## 2013-07-30 NOTE — Telephone Encounter (Signed)
PA approved for Ritalin through 07/29/14. Notified pharm.

## 2013-07-31 ENCOUNTER — Encounter: Payer: Self-pay | Admitting: Family Medicine

## 2013-07-31 NOTE — Progress Notes (Signed)
Quick Note:  Please notify pt that results are normal.   Provide pt with copy of labs. ______ 

## 2013-08-01 ENCOUNTER — Encounter: Payer: Self-pay | Admitting: Family Medicine

## 2013-08-04 NOTE — Telephone Encounter (Signed)
Called insurance to verify status on PA. Med was approved...[name]

## 2013-08-29 ENCOUNTER — Other Ambulatory Visit: Payer: Self-pay | Admitting: Family Medicine

## 2013-08-29 ENCOUNTER — Other Ambulatory Visit: Payer: Self-pay | Admitting: Internal Medicine

## 2013-10-14 ENCOUNTER — Encounter: Payer: BC Managed Care – PPO | Admitting: Internal Medicine

## 2013-10-28 ENCOUNTER — Ambulatory Visit (INDEPENDENT_AMBULATORY_CARE_PROVIDER_SITE_OTHER): Payer: BC Managed Care – PPO | Admitting: Family Medicine

## 2013-10-28 ENCOUNTER — Encounter: Payer: Self-pay | Admitting: Family Medicine

## 2013-10-28 VITALS — BP 120/78 | HR 75 | Temp 98.7°F | Resp 16 | Ht 62.0 in | Wt 168.6 lb

## 2013-10-28 DIAGNOSIS — J329 Chronic sinusitis, unspecified: Secondary | ICD-10-CM

## 2013-10-28 DIAGNOSIS — F988 Other specified behavioral and emotional disorders with onset usually occurring in childhood and adolescence: Secondary | ICD-10-CM

## 2013-10-28 DIAGNOSIS — K439 Ventral hernia without obstruction or gangrene: Secondary | ICD-10-CM

## 2013-10-28 MED ORDER — AMPHETAMINE-DEXTROAMPHETAMINE 20 MG PO TABS: ORAL_TABLET | ORAL | Status: DC

## 2013-10-28 NOTE — Patient Instructions (Signed)
I have prescribed Adderall 20 mg  1 tablet 2-3 times a day for ADD symptoms. I would prefer you discuss this medication and ongoing use with Dr. Sabra Heck. Contact our clinic if he decides not to prescribe it. I have contacted the pharmacy about the Ritalin prescriptions in your possession; they have been voided and cannot be filled.

## 2013-10-28 NOTE — Progress Notes (Signed)
Subjective:    Patient ID: [name], female    DOB: 08/19/1950, 63 y.o.   MRN: [id number]  HPI  This 63 y.o. Cauc female is here for follow-up re: ADD. She was given a 89-month supply of prescriptions for Ritalin (generic) at visit at end of June but did not get the medication. She reports that she had a prescription from Dr. Sabra Heck and had not exhausted that supply of medication. She states she is taking methylphenidate as needed; she wants to switch to Adderall which she has used in the past. She did not bring back the prescriptions from June; stating they were somewhere in her car or at home.   Dr. Sabra Heck recently started pt on Cymbalta by Dr. Sabra Heck, goal dose being 90 mg daily. Pt is at 60 mg but thinks medication is not reducing symptoms for which it was prescribed. She has less pain but nto seeing benefits re: treatment of depression. She does not have return appt w/  Dr. Sabra Heck but is supposed to return to his clinic at [facility] within next 6 weeks.  Pt has concerns about bulging in upper part of abdomen. Questionable hx of hernia related to previous ulcer surgery in 1985. Thinks she had some type of imaging to assess hernia but not sure. She will sign ROI so records can be obtained from physician who evaluated this problem.   Pt recovering from URI; she had onset symptoms prior to Labor Day and attributes sinus pain and cough to exposure to sick pre-schoolers. She has allergies and had increased nasal discharge, PND and NP cough lasting for weeks. She was active w/ symptomatic treatment (NETI pot, OTC medications and antihistamines) and symptoms slowly resolved. She has hx of L-sided nasal polyps and is concerned that polyps now exist in R nasal cavity.  Patient Active Problem List   Diagnosis Date Noted  . Sinusitis, chronic   . Asthma 01/08/2012  . Dyslipidemia   . ADD (attention deficit disorder)   . Depression   . Osteopenia   . Incisional hernia   . Stress incontinence, female      Prior to Admission medications   Medication Sig Start Date End Date Taking? Authorizing Provider  amphetamine-dextroamphetamine (ADDERALL) 20 MG tablet Take 1 tablet 2-3 times a day for ADD. 10/28/13  Yes Barton Fanny, MD  benzonatate (TESSALON) 200 MG capsule TAKE 1 CAPSULE 3 TIMES A DAY AS NEEDED COUGH 06/10/13  Yes Rowe Clack, MD  DULoxetine (CYMBALTA) 60 MG capsule Take 30 mg by mouth 3 (three) times daily.    Yes Historical Provider, MD  Fluticasone-Salmeterol (ADVAIR) 250-50 MCG/DOSE AEPB Inhale 1 puff into the lungs 2 (two) times daily as needed. 01/08/12  Yes Rowe Clack, MD  loratadine (CLARITIN) 10 MG tablet Take 10 mg by mouth daily as needed.    Yes Historical Provider, MD  mometasone (NASONEX) 50 MCG/ACT nasal spray Place 2 sprays into the nose daily as needed.    Yes Historical Provider, MD  Multiple Vitamin (MULTIVITAMIN) tablet Take 1 tablet by mouth daily.     Yes Historical Provider, MD  OVER THE COUNTER MEDICATION as needed. Tylenol 650 mg 2 tabs daily   Yes Historical Provider, MD  OVER THE COUNTER MEDICATION OTC Magnesium Complex 2 caps daily with food   Yes Historical Provider, MD  OVER THE COUNTER MEDICATION New Chapter Bone Strength taking 2-3 tabs daily with food   Yes Historical Provider, MD  [place] taking prn  Yes Historical Provider, MD  simvastatin (ZOCOR) 40 MG tablet TAKE 1 TABLET BY MOUTH AT BEDTIME 05/24/12  Yes Rowe Clack, MD    History   Social History  . Marital Status: Single    Spouse Name: N/A    Number of Children: N/A  . Years of Education: N/A   Occupational History  . Teaching assistant     Part-time   Social History Main Topics  . Smoking status: Never Smoker   . Smokeless tobacco: Not on file  . Alcohol Use: Yes     Comment: social  . Drug Use: No  . Sexual Activity: Yes    Birth Control/ Protection: Post-menopausal   Other Topics Concern  . Not on file   Social History  Narrative  . No narrative on file    Family History  Problem Relation Age of Onset  . Arthritis Mother   . Heart disease Mother     CABG @ 69  . Diabetes Mother   . Osteoporosis Mother   . Arthritis Father   . Lymphoma Father 59    chemo  . Cancer Father     lymphoma  . Lymphoma Paternal Grandmother   . Diabetes Maternal Grandmother   . Hyperlipidemia Mother   . Lymphoma Father      Review of Systems  Constitutional: Negative.   HENT: Positive for rhinorrhea, sinus pressure and sore throat. Negative for congestion, ear pain, nosebleeds and trouble swallowing.   Eyes: Negative.   Respiratory: Positive for cough. Negative for choking, chest tightness, shortness of breath and wheezing.   Cardiovascular: Negative.   Musculoskeletal: Positive for arthralgias. Negative for gait problem.  Skin: Negative.   Neurological: Negative.   Psychiatric/Behavioral: Positive for decreased concentration. Negative for behavioral problems, sleep disturbance and agitation. The patient is hyperactive.       Objective:   Physical Exam  Nursing note and vitals reviewed. Constitutional: She is oriented to person, place, and time. She appears well-developed and well-nourished. No distress.  HENT:  Head: Normocephalic and atraumatic.  Right Ear: Hearing, tympanic membrane, external ear and ear canal normal.  Left Ear: Hearing, tympanic membrane, external ear and ear canal normal.  Nose: Nose normal. No mucosal edema, rhinorrhea, nasal deformity or septal deviation. Right sinus exhibits no maxillary sinus tenderness and no frontal sinus tenderness. Left sinus exhibits no maxillary sinus tenderness and no frontal sinus tenderness.  Mouth/Throat: Uvula is midline and mucous membranes are normal. No oral lesions. No dental caries. Posterior oropharyngeal erythema present. No oropharyngeal exudate.  Eyes: Conjunctivae and EOM are normal. Pupils are equal, round, and reactive to light. No scleral icterus.   Neck: Normal range of motion. Neck supple.  Cardiovascular: Normal rate, regular rhythm and normal heart sounds.   Pulmonary/Chest: Effort normal and breath sounds normal. No respiratory distress.  Abdominal: Soft. Normal appearance and bowel sounds are normal. She exhibits no distension and no mass. There is tenderness in the epigastric area. There is no rebound, no guarding, no CVA tenderness and negative Murphy's sign. A hernia is present. Hernia confirmed positive in the ventral area.  Defect to L of midline in epigastrium; mildly tender and easily reducible.  Musculoskeletal: Normal range of motion. She exhibits no edema and no tenderness.  Neurological: She is alert and oriented to person, place, and time. No cranial nerve deficit. She exhibits normal muscle tone. Coordination normal.  Skin: Skin is warm and dry. No rash noted. She is not diaphoretic. No pallor.  Psychiatric:  She has a normal mood and affect. Her speech is normal and behavior is normal. Cognition and memory are normal.  Pt demeanor is normal but she is somewhat tangential in conversation and, due to inattentiveness, repetitive.       Assessment & Plan:  ADD (attention deficit disorder)- Discontinue Methylphenidate; I contact the pharmacy to advise that the prescriptions dated for June should not be filled.  Trial Adderall 20 mg 1 tablet 2-3 times a day prn. Suggested pt address ADD/ medications with Dr. Sabra Heck; if he will agree to prescribe Adderall, I will defer to him in this regard. Pt understands. She will schedule follow-up with Dr. Sabra Heck.  Hernia of anterior abdominal wall- Pt sigb Ns ROI to get records from physician who has evaluated this problem before she moved to [place]. Briefly discussed CT ABD to evaluate hernia.  Chronic sinusitis, unspecified location- Improved with symptomatic care.  Meds ordered this encounter  . amphetamine-dextroamphetamine (ADDERALL) 20 MG tablet    Sig: Take 1 tablet 2-3 times a  day for ADD.    Dispense:  90 tablet    Refill:  0

## 2013-11-04 ENCOUNTER — Telehealth: Payer: Self-pay

## 2013-11-04 NOTE — Telephone Encounter (Signed)
PA needed for Adderall 20 mg. Completed on the phone and received approval from 10/14/13 - 11/14/14, case # [id number]. Notified pharm and pt on VM.

## 2013-11-20 ENCOUNTER — Ambulatory Visit (INDEPENDENT_AMBULATORY_CARE_PROVIDER_SITE_OTHER): Payer: BC Managed Care – PPO | Admitting: Family Medicine

## 2013-11-20 VITALS — BP 130/80 | HR 90 | Temp 99.0°F | Resp 16 | Ht 62.0 in | Wt 170.2 lb

## 2013-11-20 DIAGNOSIS — R109 Unspecified abdominal pain: Secondary | ICD-10-CM

## 2013-11-20 DIAGNOSIS — K432 Incisional hernia without obstruction or gangrene: Secondary | ICD-10-CM

## 2013-11-20 DIAGNOSIS — J040 Acute laryngitis: Secondary | ICD-10-CM

## 2013-11-20 DIAGNOSIS — R05 Cough: Secondary | ICD-10-CM

## 2013-11-20 DIAGNOSIS — R059 Cough, unspecified: Secondary | ICD-10-CM

## 2013-11-20 MED ORDER — BENZONATATE 200 MG PO CAPS: ORAL_CAPSULE | ORAL | Status: DC

## 2013-11-20 MED ORDER — HYDROCODONE-HOMATROPINE 5-1.5 MG/5ML PO SYRP: 5.0000 mL | ORAL_SOLUTION | Freq: Four times a day (QID) | ORAL | Status: DC | PRN

## 2013-11-20 NOTE — Patient Instructions (Addendum)
Your cough is likely from a viral infection. You do not need antibiotics at this time.  We've refilled your tessalon. Take 1 pill every 8 hours as needed for cough. We've sent in a prescription for hycodan. Please take 5-17mL every 6 hours as needed for cough. Remember this medication will make you sleepy. We've referred you to General Surgery to have your ventral incisional hernia looked at. You will be contacted tomorrow. We would like you to see them early tomorrow morning.  If you worsen tonight with severe pain around the hernia, get fever or chills, have N/V, or generally feel worse get to the ED ASAP.

## 2013-11-20 NOTE — Progress Notes (Signed)
Subjective:    Patient ID: [name], female    DOB: Jul 03, 1950, 63 y.o.   MRN: [id number]  No PCP Per Patient  Chief Complaint  Patient presents with  . Cough    productive with thick white phlegm  . Laryngitis    since friday ( 6 days )  . Hernia    has incisional hernia that she believes cough has made worse.   Patient Active Problem List   Diagnosis Date Noted  . Sinusitis, chronic   . Asthma 01/08/2012  . Dyslipidemia   . ADD (attention deficit disorder)   . Depression   . Osteopenia   . Incisional hernia   . Stress incontinence, female    Prior to Admission medications   Medication Sig Start Date End Date Taking? Authorizing Provider  amphetamine-dextroamphetamine (ADDERALL) 20 MG tablet Take 1 tablet 2-3 times a day for ADD. 10/28/13  Yes Barton Fanny, MD  benzonatate (TESSALON) 200 MG capsule TAKE 1 CAPSULE 3 TIMES A DAY AS NEEDED COUGH 11/20/13  Yes Araceli Bouche, PA  DULoxetine (CYMBALTA) 60 MG capsule Take 30 mg by mouth 3 (three) times daily.    Yes Historical Provider, MD  Fluticasone-Salmeterol (ADVAIR) 250-50 MCG/DOSE AEPB Inhale 1 puff into the lungs 2 (two) times daily as needed. 01/08/12  Yes Rowe Clack, MD  loratadine (CLARITIN) 10 MG tablet Take 10 mg by mouth daily as needed.    Yes Historical Provider, MD  mometasone (NASONEX) 50 MCG/ACT nasal spray Place 2 sprays into the nose daily as needed.    Yes Historical Provider, MD  Multiple Vitamin (MULTIVITAMIN) tablet Take 1 tablet by mouth daily.     Yes Historical Provider, MD  OVER THE COUNTER MEDICATION as needed. Tylenol 650 mg 2 tabs daily   Yes Historical Provider, MD  OVER THE COUNTER MEDICATION OTC Magnesium Complex 2 caps daily with food   Yes Historical Provider, MD  OVER THE COUNTER MEDICATION New Chapter Bone Strength taking 2-3 tabs daily with food   Yes Historical Provider, MD  PRESCRIPTION MEDICATION OTC Allegra taking prn   Yes Historical Provider, MD  simvastatin (ZOCOR) 40 MG  tablet TAKE 1 TABLET BY MOUTH AT BEDTIME 05/24/12  Yes Rowe Clack, MD  HYDROcodone-homatropine (HYCODAN) 5-1.5 MG/5ML syrup Take 5-10 mLs by mouth every 6 (six) hours as needed for cough. 11/20/13   Araceli Bouche, PA   Medications, allergies, past medical history, surgical history, family history, social history and problem list reviewed and updated.  HPI  63 yof with PMH intermittent mild asthma, chronic sinusitis, and ventral incisional hernia presents with cough, hoarse voice, and concern for enlarged hernia.   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 She was around some sick coworkers and was also yelling a lot that day. The cough became productive 2 days ago with a white sputum. She has cough throughout the day. Has been taking tessalon and nyquil which have helped, but the cough was worse last night and kept her up most of the night. Been using mucinex the past couple days. She very rarely uses her advair inhaler and has not used it during the past week. Denies sore throat, ear pain, abd pain, N/V, diarrhea. No rhinorrhea. Positive congestion. She feels that she is slowly getting her voice back, but admits she has to do a lot of talking at work. She denies any fever, chills, neck pain, or  GERD symptoms. She takes an antihistamine nightly.   Hernia - She had surgery for a bleeding ulcer 30 years ago and noticed a ventral incisional hernia approx 5-7 yrs ago. This is typically the size of her fist. After her coughing spell she noticed it was much larger last night. She felt a bit of pain around the area (6/10) and that she had to belch a lot last night. She denies any fever, chills, N/V, diarrhea or severe abd pain last night. Today she notes the hernia has decreased in size a bit and is much less tender (2/10). She has been belching less today and had a formed BM today.   Review of Systems See HPI.     Objective:     Physical Exam  Constitutional: She is oriented to person, place, and time. She appears well-nourished.  Non-toxic appearance. She does not have a sickly appearance. She does not appear ill. No distress.  BP 130/80  Pulse 90  Temp(Src) 99 F (37.2 C) (Oral)  Resp 16  Ht 5\' 2"  (1.575 m)  Wt 170 lb 3.2 oz (77.202 kg)  BMI 31.12 kg/m2  SpO2 98%  LMP 08/30/2008   HENT:  Head: Normocephalic and atraumatic.  Right Ear: Hearing, tympanic membrane, external ear and ear canal normal. Tympanic membrane is not erythematous, not retracted and not bulging.  Left Ear: Hearing, tympanic membrane, external ear and ear canal normal. Tympanic membrane is not erythematous, not retracted and not bulging.  Nose: Nose normal. No mucosal edema or rhinorrhea. Right sinus exhibits no maxillary sinus tenderness and no frontal sinus tenderness. Left sinus exhibits no maxillary sinus tenderness and no frontal sinus tenderness.  Mouth/Throat: Uvula is midline, oropharynx is clear and moist and mucous membranes are normal. No oropharyngeal exudate, posterior oropharyngeal edema or posterior oropharyngeal erythema.  Cardiovascular: Normal rate, regular rhythm, S1 normal, S2 normal and normal heart sounds.  Exam reveals no gallop.   No murmur heard. Pulmonary/Chest: Effort normal and breath sounds normal. Not tachypneic. No respiratory distress. She has no decreased breath sounds. She has no wheezes. She has no rhonchi. She has no rales.  Abdominal: Normal aorta and bowel sounds are normal. There is tenderness. A hernia is present. Hernia confirmed positive in the ventral area.  Large ventral hernia just left of midline. Periumbilical/RUQ area. 5" x 3". 2" vertical. No erythema. Normal bowel sounds over hernia. Mild TTP when palpating laterally near hernia. Not reducible.   Neurological: She is alert and oriented to person, place, and time.  Psychiatric: She has a normal mood and affect. Her speech is normal.       Assessment & Plan:   63 yof with PMH sinusitis, mild intermittent asthma, and hx incisional hernia presents with cough, hoarse voice, and enlarged hernia.  Ventral incisional hernia - Plan: Ambulatory referral to General Surgery Abdominal wall pain - Plan: Ambulatory referral to General Surgery --Hernia has enlarged and is not reducible.  --Not strangulated as normal BS over hernia, pt without fever/chills, not toxic appearing, and only very mild TTP near hernia currently.  --No concern for bowel obstruction currently as belching has resolved and BM today.  --Consult sent to [facility]. Pt instructed she needs to see them tomorrow morning.  --Pt instructed to go to ED urgently tonight if she gets severe pain around hernia, has fever or chills, or feels worse in general.   Cough - Plan: HYDROcodone-homatropine (HYCODAN) 5-1.5 MG/5ML syrup, benzonatate (TESSALON) 200 MG capsule --Hycodan and Tessalon as  prescribed. Important to keep cough under control for hernia purposes.  Laryngitis --Tylenol, hydration, warm water gargles, voice rest.    Julieta Gutting, PA-C Physician Assistant-Certified [facility]  11/20/2013 7:36 PM

## 2013-11-21 ENCOUNTER — Telehealth: Payer: Self-pay

## 2013-11-21 NOTE — Telephone Encounter (Addendum)
She should drink a lot of fluids to help with her congestion.  If Mucinex makes her cough increase she is ok not to take it.

## 2013-11-21 NOTE — Telephone Encounter (Signed)
Pt.notified

## 2013-11-21 NOTE — Telephone Encounter (Signed)
Pt called inquiring whether she should start taking Mucinex again. Pt states previously the Mucinex made her cough worse. I told her I did not think it would be a problem to take the Mucinex because she needs to clear the congestion out of her chest. She would like you to give her a call. She also wanted you to know that [facility] contacted her and she has an appointment Monday at 2pm.

## 2013-11-24 ENCOUNTER — Telehealth: Payer: Self-pay | Admitting: *Deleted

## 2013-11-24 LAB — HM MAMMOGRAPHY

## 2013-11-24 NOTE — Telephone Encounter (Signed)
Pt states that she is going to see Dr. Leward Quan tomorrow instead of going to the general sx.  She is still coughing and bringing up mucus.  Today she states she can move around today without any pain. She can sleep on her stomach today without pain, cough without pain.

## 2013-11-24 NOTE — Telephone Encounter (Signed)
Pt is anxious to have a call back re upcoming surgical procedure,all symptoms have been alleviated! Please advise As appt is this afternoon   Best phone is [phone]

## 2013-11-24 NOTE — Telephone Encounter (Signed)
Pt came in and saw Dr. Tamala Julian in the Urgent Care side.   Per pt they are doing a referral for a surgical consultant and wants Dr. Leward Quan opinion before she continues down that path.  Pt [name]

## 2013-11-25 ENCOUNTER — Encounter: Payer: Self-pay | Admitting: Family Medicine

## 2013-11-25 ENCOUNTER — Ambulatory Visit (INDEPENDENT_AMBULATORY_CARE_PROVIDER_SITE_OTHER): Payer: BC Managed Care – PPO

## 2013-11-25 ENCOUNTER — Ambulatory Visit (INDEPENDENT_AMBULATORY_CARE_PROVIDER_SITE_OTHER): Payer: BC Managed Care – PPO | Admitting: Family Medicine

## 2013-11-25 ENCOUNTER — Ambulatory Visit: Payer: BC Managed Care – PPO | Admitting: Family Medicine

## 2013-11-25 VITALS — BP 121/76 | HR 86 | Temp 98.0°F | Resp 16 | Ht 62.0 in | Wt 172.6 lb

## 2013-11-25 DIAGNOSIS — R490 Dysphonia: Secondary | ICD-10-CM

## 2013-11-25 DIAGNOSIS — R05 Cough: Secondary | ICD-10-CM

## 2013-11-25 DIAGNOSIS — R058 Other specified cough: Secondary | ICD-10-CM

## 2013-11-25 DIAGNOSIS — K432 Incisional hernia without obstruction or gangrene: Secondary | ICD-10-CM

## 2013-11-25 NOTE — Patient Instructions (Addendum)
Cough, Adult  A cough is a reflex that helps clear your throat and airways. It can help heal the body or may be a reaction to an irritated airway. A cough may only last 2 or 3 weeks (acute) or may last more than 8 weeks (chronic).  CAUSES Acute cough:  Viral or bacterial infections. Chronic cough:  Infections.  Allergies.  Asthma.  Post-nasal drip.  Smoking.  Heartburn or acid reflux.  Some medicines.  Chronic lung problems (COPD).  Cancer. SYMPTOMS   Cough.  Fever.  Chest pain.  Increased breathing rate.  High-pitched whistling sound when breathing (wheezing).  Colored mucus that you cough up (sputum). TREATMENT   A bacterial cough may be treated with antibiotic medicine.  A viral cough must run its course and will not respond to antibiotics.  Your caregiver may recommend other treatments if you have a chronic cough. HOME CARE INSTRUCTIONS   Only take over-the-counter or prescription medicines for pain, discomfort, or fever as directed by your caregiver. Use cough suppressants only as directed by your caregiver.  Use a cold steam vaporizer or humidifier in your bedroom or home to help loosen secretions.  Sleep in a semi-upright position if your cough is worse at night.  Rest as needed.  Stop smoking if you smoke. SEEK IMMEDIATE MEDICAL CARE IF:   You have pus in your sputum.  Your cough starts to worsen.  You cannot control your cough with suppressants and are losing sleep.  You begin coughing up blood.  You have difficulty breathing.  You develop pain which is getting worse or is uncontrolled with medicine.  You have a fever. MAKE SURE YOU:   Understand these instructions.  Will watch your condition.  Will get help right away if you are not doing well or get worse. Document Released: 07/15/2010 Document Revised: 04/10/2011 Document Reviewed: 07/15/2010 [facility] Patient Information 2015 [place], [place]. This information is not intended  to replace advice given to you by your health care provider. Make sure you discuss any questions you have with your health care provider.    Hoarseness- get some honey and lemon throat lozenges and try to rest your voice as much as possible. Continue to use the cough medication. If your voice does not improve in the next 7-10 days, contact us for a referral to ENT.    Regarding the incisional hernia- I think you need to have a CT abdomen for better evaluation of the extend of the hernia.  I have ordered this imaging study and you will be contact about the specifics (date and time and place). If you need to review the instructions about the CT, you can call back to [facility] at [phone] after you get off work. [address] building is open until 8 PM Monday through Thursday.

## 2013-11-25 NOTE — Progress Notes (Signed)
Subjective:    Patient ID: [name], female    DOB: [birth year], 63 y.o.   MRN: [id number]  HPI  This 63 y.o. Cauc female is here for follow-up of persistent cough, first evaluated on 10/28/2013. She did improve then had recurrence of symptoms 5 days before being seen at [facility] on 11/20/13. Cough was slightly prod and has remained so; sputum has been white to slightly discolored. No associated fever, chills, ear pain, sore throat,sinus pressure, n/v/d, myalgias, HA or dizziness. Pt now is hoarse; if she speaks in a low volume, her voice is close to normal. This problem has occurred in the past and was thought to be due to a "muscle spasm" involving vocal cords, according to pt.  She has been working, so voice rest has been limited. Cough is almost resolved w/ Hycodan syrup and benzonatate capsules. Pt not using Advair as prescribed.  Incisional hernia- pt evaluated 11/20/13 for this problem which appeared to be emergent at that visit. She was referred to Gen Surgeon but could not be scheduled until 11/24/13 w/ specialist in [place], [place]. Pt did not want to be seen by surgeon in a different hospital system; her abd pain and prominent hernia had resolved by appt time. She was resting well, had normal appetite and bowel movements. Pt cancelled appt but wanted to pursue further work-up; she has had this hernia for 20+ years; it is a result of ulcer surgery performed in 1985.  Patient Active Problem List   Diagnosis Date Noted  . Sinusitis, chronic   . Asthma 01/08/2012  . Dyslipidemia   . ADD (attention deficit disorder)   . Depression   . Osteopenia   . Incisional hernia   . Stress incontinence, female     Prior to Admission medications   Medication Sig Start Date End Date Taking? Authorizing Provider  amphetamine-dextroamphetamine (ADDERALL) 20 MG tablet Take 1 tablet 2-3 times a day for ADD. 10/28/13  Yes Barton Fanny, MD  benzonatate (TESSALON) 200 MG capsule TAKE 1 CAPSULE 3  TIMES A DAY AS NEEDED COUGH 11/20/13  Yes Araceli Bouche, PA  DULoxetine (CYMBALTA) 60 MG capsule Take 30 mg by mouth 3 (three) times daily.    Yes Historical Provider, MD  Fluticasone-Salmeterol (ADVAIR) 250-50 MCG/DOSE AEPB Inhale 1 puff into the lungs 2 (two) times daily as needed. 01/08/12  Yes Rowe Clack, MD  HYDROcodone-homatropine [facility]) 5-1.5 MG/5ML syrup Take 5-10 mLs by mouth every 6 (six) hours as needed for cough. 11/20/13  Yes Todd McVeigh, PA  loratadine (CLARITIN) 10 MG tablet Take 10 mg by mouth daily as needed.    Yes Historical Provider, MD  mometasone (NASONEX) 50 MCG/ACT nasal spray Place 2 sprays into the nose daily as needed.    Yes Historical Provider, MD  Multiple Vitamin (MULTIVITAMIN) tablet Take 1 tablet by mouth daily.     Yes Historical Provider, MD  OVER THE COUNTER MEDICATION as needed. Tylenol 650 mg 2 tabs daily   Yes Historical Provider, MD  OVER THE COUNTER MEDICATION OTC Magnesium Complex 2 caps daily with food   Yes Historical Provider, MD  OVER THE COUNTER MEDICATION [place] Bone Strength taking 2-3 tabs daily with food   Yes Historical Provider, MD  PRESCRIPTION MEDICATION OTC Allegra taking prn   Yes Historical Provider, MD  simvastatin (ZOCOR) 40 MG tablet TAKE 1 TABLET BY MOUTH AT BEDTIME 05/24/12  Yes Rowe Clack, MD    SOC and FAM Hx reviewed.  Review of Systems  Constitutional: Negative.   HENT: Positive for postnasal drip and voice change. Negative for congestion, drooling, ear pain, mouth sores, rhinorrhea, sinus pressure, sore throat and trouble swallowing.   Eyes: Negative.   Respiratory: Positive for cough. Negative for chest tightness, shortness of breath and wheezing.   Cardiovascular: Negative.   Gastrointestinal: Negative.   Musculoskeletal: Negative.   Neurological: Negative.        Objective:   Physical Exam  Nursing note and vitals reviewed. Constitutional: She is oriented to person, place, and time. Vital  signs are normal. She appears well-developed and well-nourished. She does not appear ill. No distress.  HENT:  Head: Normocephalic and atraumatic.  Right Ear: Hearing, tympanic membrane, external ear and ear canal normal.  Left Ear: Hearing, tympanic membrane, external ear and ear canal normal.  Nose: Nose normal. No nasal deformity or septal deviation. Right sinus exhibits no maxillary sinus tenderness and no frontal sinus tenderness. Left sinus exhibits no maxillary sinus tenderness and no frontal sinus tenderness.  Mouth/Throat: Uvula is midline and mucous membranes are normal. No oral lesions. No uvula swelling. Posterior oropharyngeal erythema present. No oropharyngeal exudate or posterior oropharyngeal edema.  Voice is mildly hoarse.  Eyes: Conjunctivae, EOM and lids are normal. Pupils are equal, round, and reactive to light. No scleral icterus.  Neck: Trachea normal and normal range of motion. Neck supple. No rigidity. No edema present. No mass and no thyromegaly present.  Cardiovascular: Regular rhythm, S1 normal, S2 normal and normal heart sounds.  PMI is not displaced.  Exam reveals no gallop and no friction rub.   No murmur heard. Pulmonary/Chest: Effort normal and breath sounds normal. No stridor. No respiratory distress.  Abdominal: Soft. Normal appearance and bowel sounds are normal. She exhibits no distension and no mass. There is no tenderness. There is no guarding and no CVA tenderness.  Epig incisional hernia soft and nontender.  Musculoskeletal: Normal range of motion. She exhibits no edema and no tenderness.  Neurological: She is alert and oriented to person, place, and time. No cranial nerve deficit.  Skin: Skin is warm and dry. No rash noted. No erythema. No pallor.  Psychiatric: She has a normal mood and affect. Her speech is normal. Judgment and thought content normal. She is hyperactive. Cognition and memory are normal.  Pt very talkative despite being hoarse.    [facility]  reading (PRIMARY) by  Dr. Leward Quan: CXR- Lung fields clear w/o infiltrate, opacities or masses. Heart shadow normal. No active disease.     Assessment & Plan:  Post-viral cough syndrome - Continue using Hycodan and mucinex. Expect continued improvement. Use MDI as prescribed. Plan: DG Chest 2 View  Voice hoarseness - Voice rest emphasized along w/ OTC honey and lemon throat lozenges.  Plan: DG Chest 2 View  Incisional hernia, without obstruction or gangrene - Pt cancelled appt with surgical specialist; will image upper abdomen then refer back to surgeon non-emergently. Pt agrees. Plan: CT Abdomen W Contrast

## 2013-11-26 ENCOUNTER — Other Ambulatory Visit: Payer: Self-pay | Admitting: *Deleted

## 2013-11-26 ENCOUNTER — Encounter: Payer: Self-pay | Admitting: Family Medicine

## 2013-11-26 DIAGNOSIS — K432 Incisional hernia without obstruction or gangrene: Secondary | ICD-10-CM

## 2013-11-27 NOTE — Progress Notes (Signed)
History and physical examinations obtained with [facility]  Agree with assessment and plan. Pt expresses understanding that if hernia becomes very painful, if she develops vomiting, or if she develops significant abdominal incision prior to undergoing surgical consultation, she is to present to ED.

## 2013-11-28 ENCOUNTER — Ambulatory Visit (HOSPITAL_COMMUNITY)
Admission: RE | Admit: 2013-11-28 | Discharge: 2013-11-28 | Disposition: A | Discharge status: Home / Self Care | Payer: BC Managed Care – PPO | Source: Ambulatory Visit | Attending: Family Medicine | Admitting: Family Medicine

## 2013-11-28 DIAGNOSIS — K439 Ventral hernia without obstruction or gangrene: Secondary | ICD-10-CM | POA: Insufficient documentation

## 2013-11-28 DIAGNOSIS — K432 Incisional hernia without obstruction or gangrene: Secondary | ICD-10-CM | POA: Diagnosis present

## 2013-11-28 DIAGNOSIS — N200 Calculus of kidney: Secondary | ICD-10-CM | POA: Insufficient documentation

## 2013-11-28 LAB — POCT I-STAT CREATININE: Creatinine, Ser: 0.8 mg/dL (ref 0.50–1.10)

## 2013-11-28 MED ORDER — IOHEXOL 300 MG/ML  SOLN
50.0000 mL | Freq: Once | INTRAMUSCULAR | Status: AC | PRN
  Administered 2013-11-28: 50 mL via ORAL

## 2013-11-28 MED ORDER — IOHEXOL 300 MG/ML  SOLN
100.0000 mL | Freq: Once | INTRAMUSCULAR | Status: AC | PRN
  Administered 2013-11-28: 100 mL via INTRAVENOUS

## 2013-12-01 ENCOUNTER — Telehealth: Payer: Self-pay

## 2013-12-01 ENCOUNTER — Encounter: Payer: Self-pay | Admitting: Family Medicine

## 2013-12-01 NOTE — Telephone Encounter (Signed)
Pt requesting refill on HYDROcodone-homatropine (HYCODAN) 5-1.5 MG/5ML syrup [[id number]]

## 2013-12-03 ENCOUNTER — Telehealth: Payer: Self-pay | Admitting: *Deleted

## 2013-12-03 NOTE — Telephone Encounter (Signed)
Spoke with patient regarding hydrocodone cough syrup refill. Patient states the mucinex does not help. She does not understand why her refill is denied if that's the only medicine that helps her. She also had questions about her CT scan. She states she will call Dr. Leward Quan in the morning

## 2013-12-03 NOTE — Progress Notes (Signed)
Quick Note:  Regarding recent abdominal CT to evaluate hernia: there is some fatty tissue in the hernia but no bowel loops are noted in the hernia.  I would suggest referral to a general surgeon to discuss repair of this hernia. I will proceed with the referral if you are in agreement. ______

## 2013-12-03 NOTE — Telephone Encounter (Signed)
Refill for hycodan is denied. Pt can continue to use Mucinex- DM and Tessalon for post-viral cough.

## 2013-12-03 NOTE — Telephone Encounter (Signed)
Left message for patient to call back  

## 2013-12-04 LAB — HM MAMMOGRAPHY

## 2013-12-04 NOTE — Telephone Encounter (Signed)
Patient advised about cough medication.  Can you please review CT Scan as patient is requiring about it?  Thank you.

## 2013-12-04 NOTE — Progress Notes (Signed)
LMVM for patient to CB.   

## 2013-12-04 NOTE — Telephone Encounter (Signed)
LMVM for pt to CB. 

## 2013-12-04 NOTE — Telephone Encounter (Signed)
CT scan shows a ventral hernia (pt aware) but no loops of bowel in hernia. There is fat in the hernia and pt may want to consider consulting a surgeon about elective surgical repair. The tiny non-obstructing renal stone requires no action at this time.

## 2013-12-05 NOTE — Telephone Encounter (Signed)
Left message in voice mail for patient to call back. 

## 2013-12-05 NOTE — Telephone Encounter (Signed)
Patient needs her history from [place].  Wants to know if we have received this information.   Call Dropped.   She is not very happy and needs these papers for surgery.  States this has been requested since September 23.   [phone]

## 2013-12-05 NOTE — Telephone Encounter (Signed)
Called patient back,  We mailed the request to [place] on October 23rd, after several failed attempts to fax.  LMOVM with this information..  Please call patient when we receive this as she needs to have surgery soon.  [phone]

## 2013-12-08 ENCOUNTER — Encounter: Payer: Self-pay | Admitting: Family Medicine

## 2013-12-18 ENCOUNTER — Other Ambulatory Visit (INDEPENDENT_AMBULATORY_CARE_PROVIDER_SITE_OTHER): Payer: Self-pay | Admitting: Surgery

## 2013-12-20 ENCOUNTER — Ambulatory Visit (INDEPENDENT_AMBULATORY_CARE_PROVIDER_SITE_OTHER): Payer: BC Managed Care – PPO | Admitting: Emergency Medicine

## 2013-12-20 VITALS — BP 138/87 | HR 70 | Temp 98.5°F | Resp 16 | Ht 62.0 in | Wt 173.4 lb

## 2013-12-20 DIAGNOSIS — R05 Cough: Secondary | ICD-10-CM

## 2013-12-20 DIAGNOSIS — H66002 Acute suppurative otitis media without spontaneous rupture of ear drum, left ear: Secondary | ICD-10-CM

## 2013-12-20 DIAGNOSIS — R059 Cough, unspecified: Secondary | ICD-10-CM

## 2013-12-20 DIAGNOSIS — J029 Acute pharyngitis, unspecified: Secondary | ICD-10-CM

## 2013-12-20 LAB — POCT CBC
GRANULOCYTE PERCENT: 73.7 % (ref 37–80)
HEMATOCRIT: 42.4 % (ref 37.7–47.9)
Hemoglobin: 13.6 g/dL (ref 12.2–16.2)
LYMPH, POC: 2.4 (ref 0.6–3.4)
MCH: 29.3 pg (ref 27–31.2)
MCHC: 32.1 g/dL (ref 31.8–35.4)
MCV: 91.5 fL (ref 80–97)
MID (CBC): 0.5 (ref 0–0.9)
MPV: 7.8 fL (ref 0–99.8)
PLATELET COUNT, POC: 328 10*3/uL (ref 142–424)
POC Granulocyte: 8 — AB (ref 2–6.9)
POC LYMPH %: 21.7 % (ref 10–50)
POC MID %: 4.6 %M (ref 0–12)
RBC: 4.64 M/uL (ref 4.04–5.48)
RDW, POC: 14.1 %
WBC: 10.9 10*3/uL — AB (ref 4.6–10.2)

## 2013-12-20 LAB — POCT RAPID STREP A (OFFICE): Rapid Strep A Screen: NEGATIVE

## 2013-12-20 MED ORDER — HYDROCODONE-ACETAMINOPHEN 5-325 MG PO TABS: 1.0000 | ORAL_TABLET | Freq: Four times a day (QID) | ORAL | Status: DC | PRN

## 2013-12-20 MED ORDER — BENZONATATE 100 MG PO CAPS: ORAL_CAPSULE | ORAL | Status: DC

## 2013-12-20 MED ORDER — AMOXICILLIN 875 MG PO TABS: 875.0000 mg | ORAL_TABLET | Freq: Two times a day (BID) | ORAL | Status: DC

## 2013-12-20 MED ORDER — ANTIPYRINE-BENZOCAINE 5.4-1.4 % OT SOLN: OTIC | Status: DC

## 2013-12-20 NOTE — Patient Instructions (Signed)

## 2013-12-20 NOTE — Progress Notes (Addendum)
Subjective:    Patient ID: [name], female    DOB: 01/09/1951, 63 y.o.   MRN: [id number] This chart was scribed for Cynthia Russian, MD  by Cathie Hoops, ED Scribe. The patient was seen in Room 12. The patient's care was started at 1:50 PM.   12/20/2013  Chief Complaint  Patient presents with  . Ear Pain    Left  . Headache  . Cough    HPI HPI Comments: [name] is a 63 y.o. female who presents to the [facility] complaining of chronic, constant, moderate and gradually worsening cough since September. Pt notes she been seen multiple times for her symptoms with no improvement. She has associated left headache, chills, fever, rhinorrhea, left otalgia, ear pressure, post-nasal drainage, laryngitis, tinnitus, left sore throat and abnormal hearing. She notes her post-nasal drip is yellow and discolored. Pt describes her left otalgia as stabbing. She notes when she tore her hernia, pt is being followed with surgery. Pt notes she had a chest x-ray 9/27 with normal findings. Pt denies being allergic to any pain medications. Pt has been taking Tessalon, OTC medications, Neti-pot and warm compresses with no relief.  Pt denies any left ear drainage.  Review of Systems  Constitutional: Positive for fever and chills.  HENT: Positive for ear pain, postnasal drip, rhinorrhea, sore throat and tinnitus. Negative for ear discharge.   Respiratory: Positive for cough.     Past Medical History  Diagnosis Date  . Dyslipidemia   . Depression   . Osteopenia   . Incisional hernia   . Stress incontinence, female   . Asthma   . ADD (attention deficit disorder)   . Environmental allergies   . GERD (gastroesophageal reflux disease)   . Thyroid disease in pregnancy   . Ulcer    Past Surgical History  Procedure Laterality Date  . Tonsillectomy  1959  . Repair of perforated ulcer  1985  . Sling procedure  2005  . Cesarean section  1994  . Polypectomy     Allergies  Allergen  Reactions  . Aspirin     History of ulcers  . Bee Venom    Current Outpatient Prescriptions  Medication Sig Dispense Refill  . amphetamine-dextroamphetamine (ADDERALL) 20 MG tablet Take 1 tablet 2-3 times a day for ADD. 90 tablet 0  . benzonatate (TESSALON) 200 MG capsule TAKE 1 CAPSULE 3 TIMES A DAY AS NEEDED COUGH 20 capsule 0  . DULoxetine (CYMBALTA) 60 MG capsule Take 30 mg by mouth 3 (three) times daily.     . Fluticasone-Salmeterol (ADVAIR) 250-50 MCG/DOSE AEPB Inhale 1 puff into the lungs 2 (two) times daily as needed.    . mometasone (NASONEX) 50 MCG/ACT nasal spray Place 2 sprays into the nose daily as needed.     . Multiple Vitamin (MULTIVITAMIN) tablet Take 1 tablet by mouth daily.      Marland Kitchen OVER THE COUNTER MEDICATION as needed. Tylenol 650 mg 2 tabs daily    . OVER THE COUNTER MEDICATION OTC Magnesium Complex 2 caps daily with food    . OVER THE COUNTER MEDICATION [place] Bone Strength taking 2-3 tabs daily with food    . PRESCRIPTION MEDICATION OTC Allegra taking prn    . simvastatin (ZOCOR) 40 MG tablet TAKE 1 TABLET BY MOUTH AT BEDTIME 90 tablet 3  . HYDROcodone-homatropine (HYCODAN) 5-1.5 MG/5ML syrup Take 5-10 mLs by mouth every 6 (six) hours as needed for cough. 120 mL 0  .  loratadine (CLARITIN) 10 MG tablet Take 10 mg by mouth daily as needed.      No current facility-administered medications for this visit.       Objective:  Triage Vitals: BP 138/87 mmHg  Pulse 70  Temp(Src) 98.5 F (36.9 C) (Oral)  Resp 70  Ht 5\' 2"  (1.575 m)  Wt 173 lb 6 oz (78.642 kg)  BMI 31.70 kg/m2  SpO2 100%  LMP 08/30/2008  Physical Exam  Constitutional: She is oriented to person, place, and time. She appears well-developed and well-nourished. No distress.  The patient is tearful holding her left ear and crying.  HENT:  Head: Normocephalic and atraumatic.  The left TM is red and bulging.  Eyes: Conjunctivae and EOM are normal.  Neck: Neck supple. No tracheal deviation present.   Cardiovascular: Normal rate.   Pulmonary/Chest: Effort normal. No respiratory distress.  Musculoskeletal: Normal range of motion.  Neurological: She is alert and oriented to person, place, and time.  Skin: Skin is warm and dry.  Psychiatric: She has a normal mood and affect. Her behavior is normal.  Nursing note and vitals reviewed. EKG normal sinus rhythm borderline low voltage Assessment & Plan:  1:59 PM- Patient informed of current plan for treatment and evaluation and agrees with plan at this time. We'll treat with amoxicillin, hydrocodone and auralgan drops. I did the EKG to reassure her because of her neck pain. She has a severe left otitis media and will be treated accordingly.I personally performed the services described in this documentation, which was scribed in my presence. The recorded information has been reviewed and is accurate.

## 2013-12-20 NOTE — Progress Notes (Deleted)
   Subjective:    Patient ID: [name], female    DOB: September 19, 1950, 63 y.o.   MRN: [id number]  HPI    Review of Systems     Objective:   Physical Exam        Assessment & Plan:

## 2013-12-22 ENCOUNTER — Telehealth: Payer: Self-pay | Admitting: Family Medicine

## 2013-12-22 NOTE — Telephone Encounter (Signed)
Patient called for lab results. She had strep and CBC on Saturday 11/21. Advised of those results. She started medications Saturday and she has not had any improvement. She cant tell that the drops are doing anything. Has pain behind ear, ST, and pain in jaw. She feels relief and feels better when she takes pain med which she can not take while at work. She has to sleep sitting up because it causes to much pain to lay flat. If she bends over to pick up something has a lot of pain. She thought that she would have some improvement by now she canceled her plans for Thanksgiving because she feels so bad. Please advise

## 2013-12-23 ENCOUNTER — Ambulatory Visit: Payer: BC Managed Care – PPO | Admitting: Family Medicine

## 2013-12-23 NOTE — Telephone Encounter (Signed)
Please refer this to the scheduling pool. We will make an appointment for her to be seen at 1:00 by Tor Netters.

## 2013-12-23 NOTE — Telephone Encounter (Signed)
[name] has made appt and pt has been called and a message left.

## 2013-12-24 ENCOUNTER — Ambulatory Visit (INDEPENDENT_AMBULATORY_CARE_PROVIDER_SITE_OTHER): Payer: BC Managed Care – PPO | Admitting: Family Medicine

## 2013-12-24 VITALS — BP 124/80 | HR 74 | Temp 98.7°F | Resp 18 | Ht 62.0 in | Wt 173.0 lb

## 2013-12-24 DIAGNOSIS — K439 Ventral hernia without obstruction or gangrene: Secondary | ICD-10-CM

## 2013-12-24 DIAGNOSIS — H6692 Otitis media, unspecified, left ear: Secondary | ICD-10-CM

## 2013-12-24 MED ORDER — CEFTRIAXONE SODIUM 1 G IJ SOLR: 1.0000 g | INTRAMUSCULAR | Status: DC

## 2013-12-24 MED ORDER — LEVOFLOXACIN 500 MG PO TABS: 500.0000 mg | ORAL_TABLET | Freq: Every day | ORAL | Status: DC

## 2013-12-24 MED ORDER — CEFTRIAXONE SODIUM 1 G IJ SOLR
1.0000 g | Freq: Once | INTRAMUSCULAR | Status: AC
  Administered 2013-12-24: 1 g via INTRAMUSCULAR

## 2013-12-24 MED ORDER — PREDNISONE 20 MG PO TABS: 40.0000 mg | ORAL_TABLET | Freq: Every day | ORAL | Status: DC

## 2013-12-24 MED ORDER — HYDROCODONE-ACETAMINOPHEN 5-325 MG PO TABS: 1.0000 | ORAL_TABLET | Freq: Four times a day (QID) | ORAL | Status: DC | PRN

## 2013-12-24 NOTE — Progress Notes (Signed)
Subjective:   This chart was scribed for Robyn Haber, MD by Forrestine Him, [facility] Scribe. This patient was seen in room 5 and the patient's care was started 6:49 PM.    Patient ID: [name], female    DOB: [birth year], 63 y.o.   MRN: [id number]  Chief Complaint  Patient presents with   Follow-up    Someone named "Deloris Ping" called her yesterday and told her to come in. Pt. still has ear pain in her left ear. Pressure when bending over.     Hearing Problem    Pt. states she can not hear clearly in left ear. Pt. still feel pressure.    Cough     HPI  HPI Comments: [name] is a 63 y.o. female with a PMHx of osteopenia, dyslipidemia, asthma, GERD, and ADD who presents to [facility] here for follow-up today. Pt was seen 11/21 for an ear infection and was sent home with 3 different medications. [name] states she has taken 5 doses of Amoxicillin since last visit. As a result, she reports much improvement from onset of symptoms. However, she admits to ongoing pain to the L ear with associated pressure when bending over. At this time she is unable to hear clearly from her L ear. No fever, chills, or other associated symptoms. Pt with known allergy to aspirin.   Pt is a Consulting civil engineer for [employer].   Patient Active Problem List   Diagnosis Date Noted   Sinusitis, chronic    Asthma 01/08/2012   Dyslipidemia    ADD (attention deficit disorder)    Depression    Osteopenia    Incisional hernia    Stress incontinence, female    Past Medical History  Diagnosis Date   Dyslipidemia    Depression    Osteopenia    Incisional hernia    Stress incontinence, female    Asthma    ADD (attention deficit disorder)    Environmental allergies    GERD (gastroesophageal reflux disease)    Thyroid disease in pregnancy    Ulcer    Past Surgical History  Procedure Laterality Date   Tonsillectomy  1959   Repair of  perforated ulcer  1985   Sling procedure  2005   Cesarean section  1994   Polypectomy     Allergies  Allergen Reactions   Aspirin     History of ulcers   Bee Venom    Prior to Admission medications   Medication Sig Start Date End Date Taking? Authorizing Provider  amphetamine-dextroamphetamine (ADDERALL) 20 MG tablet Take 1 tablet 2-3 times a day for ADD. 10/28/13  Yes Barton Fanny, MD  antipyrine-benzocaine Toniann Fail) otic solution Put 2-3 drops in the left ear every 4 hours 12/20/13  Yes Darlyne Russian, MD  benzonatate (TESSALON) 100 MG capsule 1-2 tablets 3 times a day as needed for cough 12/20/13  Yes Darlyne Russian, MD  DULoxetine (CYMBALTA) 60 MG capsule Take 30 mg by mouth 3 (three) times daily.    Yes Historical Provider, MD  Fluticasone-Salmeterol (ADVAIR) 250-50 MCG/DOSE AEPB Inhale 1 puff into the lungs 2 (two) times daily as needed. 01/08/12  Yes Rowe Clack, MD  HYDROcodone-acetaminophen (NORCO) 5-325 MG per tablet Take 1 tablet by mouth every 6 (six) hours as needed. 12/20/13  Yes Darlyne Russian, MD  HYDROcodone-homatropine [facility]) 5-1.5 MG/5ML syrup Take 5-10 mLs by mouth every 6 (six) hours as needed for cough. 11/20/13  Yes Araceli Bouche, PA  loratadine (CLARITIN) 10 MG tablet Take 10 mg by mouth daily as needed.    Yes Historical Provider, MD  mometasone (NASONEX) 50 MCG/ACT nasal spray Place 2 sprays into the nose daily as needed.    Yes Historical Provider, MD  Multiple Vitamin (MULTIVITAMIN) tablet Take 1 tablet by mouth daily.     Yes Historical Provider, MD  OVER THE COUNTER MEDICATION as needed. Tylenol 650 mg 2 tabs daily   Yes Historical Provider, MD  OVER THE COUNTER MEDICATION OTC Magnesium Complex 2 caps daily with food   Yes Historical Provider, MD  OVER THE COUNTER MEDICATION [place] Bone Strength taking 2-3 tabs daily with food   Yes Historical Provider, MD  PRESCRIPTION MEDICATION OTC Allegra taking prn   Yes Historical Provider, MD    simvastatin (ZOCOR) 40 MG tablet TAKE 1 TABLET BY MOUTH AT BEDTIME 05/24/12  Yes Rowe Clack, MD    Review of Systems  Constitutional: Negative for fever and chills.  HENT: Positive for ear pain and voice change. Negative for congestion.   Respiratory: Positive for cough.     Triage Vitals: BP 124/80 mmHg   Pulse 74   Temp(Src) 98.7 F (37.1 C) (Oral)   Resp 18   Ht 5\' 2"  (1.575 m)   Wt 173 lb (78.472 kg)   BMI 31.63 kg/m2   SpO2 98%   LMP 08/30/2008   Objective:  Physical Exam  Constitutional: She is oriented to person, place, and time. She appears well-developed and well-nourished.  HENT:  Head: Normocephalic.  Eyes: EOM are normal.  Neck: Normal range of motion.  Pulmonary/Chest: Effort normal.  Abdominal: She exhibits no distension.  Musculoskeletal: Normal range of motion.  Neurological: She is alert and oriented to person, place, and time.  Psychiatric: She has a normal mood and affect.  Nursing note and vitals reviewed.     I spent extensive time face-to-face answering patient's questions and instructing her to call back if the left ear is not clearing over the next 4 days.  Assessment & Plan:   I personally performed the services described in this documentation, which was scribed in my presence. The recorded information has been reviewed and is accurate.    Acute left otitis media, recurrence not specified, unspecified otitis media type - Plan: levofloxacin (LEVAQUIN) 500 MG tablet, CT Maxillofacial W/Cm, predniSONE (DELTASONE) 20 MG tablet, HYDROcodone-acetaminophen (NORCO) 5-325 MG per tablet, cefTRIAXone (ROCEPHIN) injection 1 g, DISCONTINUED: cefTRIAXone (ROCEPHIN) injection 1 g  Abdominal wall hernia - Plan: Ambulatory referral to General Surgery  Signed, Robyn Haber, MD

## 2013-12-24 NOTE — Patient Instructions (Addendum)
*  RADIOLOGY REPORT*  Clinical Data:  CT PARANASAL SINUS LIMITED WITHOUT CONTRAST  Technique: Multidetector CT images of the paranasal sinuses were obtained in a single plane without contrast.  Comparison: None.  Findings: There is polypoid soft tissue opacity involving the left maxillary sinus, with extension into the left ostiomeatal unit and infundibulum into the left nasal cavity. The left maxillary antrum and ostium are widened with some osseous remodeling. The left maxillary sinus is completely opacified. There is opacification of the left anterior ethmoidal air cells as well as the ethmoidal bulae. The left inferior and middle turbinates appear grossly normal. Left to right bowing of the nasal septum by approximately 3 mm is present. The sphenoid sinuses and frontal sinuses are clear. Minimal circumferential opacities present along the floor of the right maxillary sinus.  The orbits are unremarkable.  IMPRESSION:  Soft tissue opacity involving the widened left maxillary ostium and antrum with extension into the ipsilateral left nasal cavity, with probable postobstructive secretions in the left maxillary sinus and left ethmoidal air cells. 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 Correlation with direct visualization is recommended.   Original Report Authenticated By: Jeannine Boga, M.D.  Dr. Tamala Julian note from November 2014:  SUBJECTIVE:  The patient has a 3 day(s) history of recurrent left jaw pain and clicking while chewing or opening mouth. She denies a history of injury to this area. Patient did recently have sinus surgery back in August of this year. 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 Patient feels that the pain is mostly a sharp pain and it is sore to palpation just anterior to the year. This can radiate up the side of her head as  well as around to her neck. Patient denies any rash in the area, but states that the pain has been so severe it has kept her up at night. Patient denies any visual changes but did have some tearing of the eye. Patient states that she's only been able to eat soft food secondary to the pain. No recent illnesses but did get her shingles vaccine within the last 2 weeks. Patient rates the pain 8/10.  OBJECTIVE: Blood pressure 144/92, pulse 97, weight 168 lb (76.204 kg), last menstrual period 08/30/2008, SpO2 95.00%. General: Patient appears moderately anxious Appears well, in no apparent distress.  Vital signs are normal.  Ears normal.  Eyes: Pupils equal round react to light and accommodation, vessels visualized on ophthalmic exam and no significant inflammation noted. Throat and pharynx normal. Neck supple. No adenopathy or masses in the neck or supraclavicular regions. Sinuses non tender. Patient does have severe tenderness in the preauricular area. This does seem to expand superior. The left temporomandibular joint reveals tenderness, pain and clicking with opening. The right temporomandibular joint reveals no tenderness, no clicking, no deformity and no pain with opening. Teeth are non tender. No signs of abscess in the mouth.  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   PLAN: Recommended a soft diet, patient given prednisone just in case this is temporal arteritis. and Dental consult. Explained nature of TMJ syndrome, treatment modalities and insurance coverage issues. Discussed with patient as well about temporal arteritis and given red flags and went to seek medical attention., Medications per orders, labs per orders. Patient will followup in 72 hours for further evaluation.

## 2013-12-28 ENCOUNTER — Telehealth: Payer: Self-pay

## 2013-12-28 NOTE — Telephone Encounter (Signed)
Pt called regarding her left ear. She is still having trouble hearing and pain when she lays on her left side. She still feels some sinus congestion. The prednisone is making her feel very jittery.   I spoke to Dr. Joseph Art who originally saw her. He said to have pt start using flonase, continue antibiotic, and stop the prednisone. He would like to re-check with pt on Wednesday morning if she is not feeling better. I called pt and relayed Dr. Pauletta Browns message to her. She understands the plan.

## 2014-01-08 ENCOUNTER — Other Ambulatory Visit: Payer: Self-pay

## 2014-01-08 DIAGNOSIS — H6092 Unspecified otitis externa, left ear: Secondary | ICD-10-CM

## 2014-01-12 ENCOUNTER — Other Ambulatory Visit: Payer: BC Managed Care – PPO

## 2014-01-12 ENCOUNTER — Other Ambulatory Visit: Payer: Self-pay | Admitting: Radiology

## 2014-01-12 DIAGNOSIS — J328 Other chronic sinusitis: Secondary | ICD-10-CM

## 2014-01-15 ENCOUNTER — Telehealth: Payer: Self-pay

## 2014-01-15 ENCOUNTER — Ambulatory Visit
Admission: RE | Admit: 2014-01-15 | Discharge: 2014-01-15 | Disposition: A | Discharge status: Home / Self Care | Payer: BC Managed Care – PPO | Source: Ambulatory Visit | Attending: Family Medicine | Admitting: Family Medicine

## 2014-01-15 ENCOUNTER — Other Ambulatory Visit: Payer: Self-pay | Admitting: Family Medicine

## 2014-01-15 DIAGNOSIS — H6092 Unspecified otitis externa, left ear: Secondary | ICD-10-CM

## 2014-01-15 DIAGNOSIS — J328 Other chronic sinusitis: Secondary | ICD-10-CM

## 2014-01-15 NOTE — Telephone Encounter (Signed)
Dr. Carlean Jews, [facility] was wanting to know what you are wanting to r/o on your CT maxillofacial. There was nothing documented in your note as to what they were going to looking for.  Thanks

## 2014-01-15 NOTE — Telephone Encounter (Signed)
Evaluate left ear pain/ sinus pressure r/o neoplasm vs polyp

## 2014-01-16 ENCOUNTER — Other Ambulatory Visit: Payer: Self-pay | Admitting: Family Medicine

## 2014-01-16 DIAGNOSIS — H7092 Unspecified mastoiditis, left ear: Secondary | ICD-10-CM

## 2014-01-19 ENCOUNTER — Telehealth: Payer: Self-pay

## 2014-01-19 NOTE — Telephone Encounter (Signed)
Pt advised of results. 

## 2014-01-19 NOTE — Telephone Encounter (Signed)
Pt states she was called the other day regarding some scans that were done, they only gave her the report of one and would like to know about the second one Please call [phone]

## 2014-01-19 NOTE — Telephone Encounter (Signed)
Pt has resumed the abx per Dr. Clotilde Dieter.

## 2014-01-19 NOTE — Telephone Encounter (Signed)
Pt has an appt with Dr. Clotilde Dieter tomorrow.

## 2014-01-19 NOTE — Telephone Encounter (Signed)
CT Sinus results:   1. Very minimal mucosal thickening along the dependent portion of the maxillary sinuses. Examination is otherwise unremarkable.  LM to advise pt

## 2014-03-16 ENCOUNTER — Other Ambulatory Visit: Payer: Self-pay | Admitting: Family Medicine

## 2014-04-03 ENCOUNTER — Telehealth: Payer: Self-pay

## 2014-04-03 DIAGNOSIS — R059 Cough, unspecified: Secondary | ICD-10-CM

## 2014-04-03 DIAGNOSIS — R05 Cough: Secondary | ICD-10-CM

## 2014-04-03 NOTE — Telephone Encounter (Signed)
Is this ok without an OV? Please advise.

## 2014-04-03 NOTE — Telephone Encounter (Signed)
Patient returned phone call and was informed that the medication is pending.

## 2014-04-03 NOTE — Telephone Encounter (Signed)
Left message for pt to call back. Rx pended.

## 2014-04-03 NOTE — Telephone Encounter (Signed)
Pt requested a refill of tessalon pearls- she has laryngitis again.

## 2014-04-03 NOTE — Telephone Encounter (Signed)
If she has COUGH, yes, OK to send in (tessalon perles 100 mg 1-2 PO Q8 hours PRN cough, #40, no RF).  If she does not have cough, treat laryngitis: rest, voice rest, fluids, ibuprofen/acetaminophen.

## 2014-04-05 ENCOUNTER — Other Ambulatory Visit: Payer: Self-pay | Admitting: Emergency Medicine

## 2014-04-06 MED ORDER — BENZONATATE 100 MG PO CAPS: ORAL_CAPSULE | ORAL | Status: DC

## 2014-04-06 NOTE — Telephone Encounter (Signed)
Spoke with pt, advised Rx was sent in. She plans to see Dr. Joseph Art on Tuesday.

## 2014-04-07 ENCOUNTER — Ambulatory Visit (INDEPENDENT_AMBULATORY_CARE_PROVIDER_SITE_OTHER): Payer: BC Managed Care – PPO | Admitting: Family Medicine

## 2014-04-07 VITALS — BP 110/84 | HR 77 | Temp 97.7°F | Resp 16 | Ht 62.0 in | Wt 176.1 lb

## 2014-04-07 DIAGNOSIS — K432 Incisional hernia without obstruction or gangrene: Secondary | ICD-10-CM | POA: Diagnosis not present

## 2014-04-07 DIAGNOSIS — H6692 Otitis media, unspecified, left ear: Secondary | ICD-10-CM | POA: Diagnosis not present

## 2014-04-07 DIAGNOSIS — H7102 Cholesteatoma of attic, left ear: Secondary | ICD-10-CM | POA: Diagnosis not present

## 2014-04-07 MED ORDER — LEVOFLOXACIN 500 MG PO TABS: 500.0000 mg | ORAL_TABLET | Freq: Every day | ORAL | Status: DC

## 2014-04-07 MED ORDER — PREDNISONE 20 MG PO TABS: 40.0000 mg | ORAL_TABLET | Freq: Every day | ORAL | Status: DC

## 2014-04-07 NOTE — Progress Notes (Signed)
This chart was scribed for Dr. Robyn Haber, MD by Erling Conte, Medical Scribe. This patient was seen in Room 12 and the patient's care was started at 6:18 PM.  Patient ID: [name] MRN: [id number], DOB: April 27, 1950, 64 y.o. Date of Encounter: 04/07/2014, 6:15 PM  Primary Physician: Ellsworth Lennox, MD  Chief Complaint:  Chief Complaint  Patient presents with  . Cough    weekend using dayquill  . Headache    going around to left ear    HPI: 64 y.o. year old female with history below presents with an intermittent, moderate, cough for 4 days. She reports she has been having an associated HA that radiates to her left ear. She has been taking Dayquil with no relief.  Pt has also been taking Tessalon which has provided relief for her cough. She was here with an ear infection in November 2015. She followed up with ENT, Dr. 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 Pt has had some mild hearing loss in that left ear. She states that her ear infection has improved somewhat but is not completely resolved. She states that the Levaquin and prednisone that she took in November provided her with relief for her ear infection. She reports that her abdominal hernia has not improved. She f/u with Dr. Ninfa Linden and had a scan done.  She states that the cough does not bring her pain with the hernia. She denies any other issues at this time.   Pt works as a Consulting civil engineer  Past Medical History  Diagnosis Date  . Dyslipidemia   . Depression   . Osteopenia   . Incisional hernia   . Stress incontinence, female   . Asthma   . ADD (attention deficit disorder)   . Environmental allergies   . GERD (gastroesophageal reflux disease)   . Ulcer      Home Meds: Prior to Admission medications   Medication Sig Start Date End Date Taking? Authorizing Provider  amphetamine-dextroamphetamine (ADDERALL) 20 MG tablet Take 1 tablet 2-3 times a day for ADD. 10/28/13  Yes Barton Fanny, MD    benzonatate (TESSALON) 100 MG capsule 1-2 tablets 3 times a day as needed for cough 04/06/14  Yes Chelle S Jeffery, PA-C  Fluticasone-Salmeterol (ADVAIR) 250-50 MCG/DOSE AEPB Inhale 1 puff into the lungs 2 (two) times daily as needed. 01/08/12  Yes Rowe Clack, MD  loratadine (CLARITIN) 10 MG tablet Take 10 mg by mouth daily as needed.    Yes Historical Provider, MD  mometasone (NASONEX) 50 MCG/ACT nasal spray Place 2 sprays into the nose daily as needed.    Yes Historical Provider, MD  Multiple Vitamin (MULTIVITAMIN) tablet Take 1 tablet by mouth daily.     Yes Historical Provider, MD  predniSONE (DELTASONE) 20 MG tablet Take 2 tablets (40 mg total) by mouth daily. 12/24/13  Yes Robyn Haber, MD  PRESCRIPTION MEDICATION OTC Allegra taking prn   Yes Historical Provider, MD  simvastatin (ZOCOR) 40 MG tablet TAKE 1 TABLET BY MOUTH AT BEDTIME. "OV NEEDED FOR ADDITIONAL REFILLS" 03/18/14  Yes Chelle S Jeffery, PA-C  antipyrine-benzocaine (AURALGAN) otic solution Put 2-3 drops in the left ear every 4 hours Patient not taking: Reported on 04/07/2014 12/20/13   Darlyne Russian, MD  DULoxetine (CYMBALTA) 60 MG capsule Take 30 mg by mouth 3 (three) times daily.     Historical Provider, MD  HYDROcodone-acetaminophen (NORCO) 5-325 MG per tablet Take 1 tablet by mouth every 6 (six) hours as  needed. Patient not taking: Reported on 04/07/2014 12/24/13   Robyn Haber, MD  HYDROcodone-homatropine [facility]) 5-1.5 MG/5ML syrup Take 5-10 mLs by mouth every 6 (six) hours as needed for cough. Patient not taking: Reported on 04/07/2014 11/20/13   Merlinda Frederick McVeigh, PA  levofloxacin (LEVAQUIN) 500 MG tablet Take 1 tablet (500 mg total) by mouth daily. Patient not taking: Reported on 04/07/2014 12/24/13   Robyn Haber, MD  OVER THE COUNTER MEDICATION as needed. Tylenol 650 mg 2 tabs daily    Historical Provider, MD  OVER THE COUNTER MEDICATION OTC Magnesium Complex 2 caps daily with food    Historical Provider, MD  OVER  THE COUNTER MEDICATION New Chapter Bone Strength taking 2-3 tabs daily with food    Historical Provider, MD    Allergies:  Allergies  Allergen Reactions  . Aspirin     History of ulcers  . Bee Venom     History   Social History  . Marital Status: Single    Spouse Name: N/A  . Number of Children: N/A  . Years of Education: N/A   Occupational History  . Teaching assistant     Part-time   Social History Main Topics  . Smoking status: Never Smoker   . Smokeless tobacco: Never Used  . Alcohol Use: 0.0 oz/week    0 Standard drinks or equivalent per week     Comment: social  . Drug Use: No  . Sexual Activity: Yes    Birth Control/ Protection: Post-menopausal   Other Topics Concern  . Not on file   Social History Narrative     Review of Systems: Constitutional: negative for chills, fever, night sweats, weight changes, or fatigue  HEENT: positive for otalgia (left ear) and mild hearing loss. negative for vision changes, congestion, rhinorrhea, ST, epistaxis, or sinus pressure Cardiovascular: negative for chest pain or palpitations Respiratory: positive for cough. negative for hemoptysis, wheezing, or shortness of breath Abdominal: negative for abdominal pain, nausea, vomiting, diarrhea, or constipation Dermatological: negative for rash Neurologic: positive for HA. Negative for dizziness, or syncope All other systems reviewed and are otherwise negative with the exception to those above and in the HPI.   Physical Exam:  Blood pressure 110/84, pulse 77, temperature 97.7 F (36.5 C), temperature source Oral, resp. rate 16, height 5\' 2"  (1.575 m), weight 176 lb 2 oz (79.89 kg), last menstrual period 08/30/2008, SpO2 97 %., Body mass index is 32.21 kg/(m^2). General: Well developed, well nourished, in no acute distress. Head: Normocephalic, atraumatic, eyes without discharge, sclera non-icteric, nares are without discharge. Bilateral auditory canals clear, TM's are without  perforation, with amber appearance of left TM. Oral cavity moist, posterior pharynx without exudate, erythema, peritonsillar abscess, or post nasal drip.  Neck: Supple. No thyromegaly. Full ROM. No lymphadenopathy. Lungs: Clear bilaterally to auscultation without wheezes, rales, or rhonchi. Breathing is unlabored. Heart: RRR with S1 S2. No murmurs, rubs, or gallops appreciated. Abdomen: Soft, non-tender, non-distended with normoactive bowel sounds. No hepatomegaly. No rebound/guarding. Moderately sized LUQ ventral hernia Msk:  Strength and tone normal for age.  Extremities/Skin: Warm and dry. No clubbing or cyanosis. No edema. No rashes or suspicious lesions. Neuro: Alert and oriented X 3. Moves all extremities spontaneously. Gait is normal. CNII-XII grossly in tact. Psych:  Responds to questions appropriately with a normal affect.     ASSESSMENT AND PLAN:  64 y.o. year old female with  This chart was scribed in my presence and reviewed by me personally.  ICD-9-CM ICD-10-CM   1. Acute left otitis media, recurrence not specified, unspecified otitis media type 382.9 H66.92 levofloxacin (LEVAQUIN) 500 MG tablet     predniSONE (DELTASONE) 20 MG tablet  2. Cholesteatoma of attic of ear, left 385.31 H71.02   3. Incisional hernia, without obstruction or gangrene 553.21 K43.2    We'll try to get old records from her doctor in [place], [place]  Signed, Robyn Haber, MD    Signed, Robyn Haber, MD 04/07/2014 6:15 PM

## 2014-04-22 ENCOUNTER — Other Ambulatory Visit: Payer: Self-pay | Admitting: Physician Assistant

## 2014-04-22 NOTE — Telephone Encounter (Signed)
Dr L, we have been putting notices on last couple of RFs of simvastatin that pt needs OV. She came in to see you but did not discuss chol. Do you want to give RFs or have her RTC? Last lipid in 06/2013 - normal

## 2014-05-18 ENCOUNTER — Telehealth: Payer: Self-pay

## 2014-05-18 NOTE — Telephone Encounter (Signed)
Patient signed ROI form on 04/07/14 for [facility] (Dr. Joseph Art) to received records from Dr. Heber Jennings in [place], [place]. Tried to fax request to [phone] multiple times but it kept failing. Request mailed on 04/27/14 and no records received yet.

## 2014-05-31 ENCOUNTER — Ambulatory Visit (INDEPENDENT_AMBULATORY_CARE_PROVIDER_SITE_OTHER): Payer: BC Managed Care – PPO | Admitting: Family Medicine

## 2014-05-31 VITALS — BP 126/78 | HR 98 | Temp 98.1°F | Ht 62.0 in | Wt 175.1 lb

## 2014-05-31 DIAGNOSIS — H6982 Other specified disorders of Eustachian tube, left ear: Secondary | ICD-10-CM | POA: Diagnosis not present

## 2014-05-31 MED ORDER — MOMETASONE FUROATE 50 MCG/ACT NA SUSP: 2.0000 | Freq: Every day | NASAL | Status: DC | PRN

## 2014-05-31 MED ORDER — PSEUDOEPHEDRINE HCL ER 120 MG PO TB12: 120.0000 mg | ORAL_TABLET | Freq: Two times a day (BID) | ORAL | Status: DC

## 2014-05-31 MED ORDER — METHYLPREDNISOLONE ACETATE 80 MG/ML IJ SUSP
80.0000 mg | Freq: Once | INTRAMUSCULAR | Status: AC
  Administered 2014-05-31: 80 mg via INTRAMUSCULAR

## 2014-05-31 MED ORDER — LORATADINE 10 MG PO TABS: 10.0000 mg | ORAL_TABLET | Freq: Every day | ORAL | Status: DC

## 2014-05-31 NOTE — Patient Instructions (Signed)
Barotitis Media Barotitis media is inflammation of your middle ear. This occurs when the auditory tube (eustachian tube) leading from the back of your nose (nasopharynx) to your eardrum is blocked. This blockage may result from a cold, environmental allergies, or an upper respiratory infection. Unresolved barotitis media may lead to damage or hearing loss (barotrauma), which may become permanent. HOME CARE INSTRUCTIONS   Use medicines as recommended by your health care provider. Over-the-counter medicines will help unblock the canal and can help during times of air travel.  Do not put anything into your ears to clean or unplug them. Eardrops will not be helpful.  Do not swim, dive, or fly until your health care provider says it is all right to do so. If these activities are necessary, chewing gum with frequent, forceful swallowing may help. It is also helpful to hold your nose and gently blow to pop your ears for equalizing pressure changes. This forces air into the eustachian tube.  Only take over-the-counter or prescription medicines for pain, discomfort, or fever as directed by your health care provider.  A decongestant may be helpful in decongesting the middle ear and make pressure equalization easier. SEEK MEDICAL CARE IF:  You experience a serious form of dizziness in which you feel as if the room is spinning and you feel nauseated (vertigo).  Your symptoms only involve one ear. SEEK IMMEDIATE MEDICAL CARE IF:   You develop a severe headache, dizziness, or severe ear pain.  You have bloody or pus-like drainage from your ears.  You develop a fever.  Your problems do not improve or become worse. MAKE SURE YOU:   Understand these instructions.  Will watch your condition.  Will get help right away if you are not doing well or get worse. Document Released: 01/14/2000 Document Revised: 11/06/2012 Document Reviewed: 08/13/2012 ExitCare Patient Information 2015 [employer]. This  information is not intended to replace advice given to you by your health care provider. Make sure you discuss any questions you have with your health care provider.  

## 2014-06-03 NOTE — Progress Notes (Signed)
Subjective:    Patient ID: [name], female    DOB: 1950-07-19, 64 y.o.   MRN: [id number]  Chief Complaint  Patient presents with  . Ear Fullness    left ear issues-scheduled to fly on Tuesday. ? ear infection. C/O pain & pressure x 2-3 days  . Headache    & neck pain on left side also    HPI  Has a h/o recurrent ear and sinus infections. Over the past few days pt developed left ear pain that began radiating up to her temple and now down neck as well.  Some mild subj f/c.  Left hearing seems muffled - feels like she needs to pop it but can't.  Leaving in several days for 2 wk vacation -flying so concerned it will cause sig ear pain. No dental pain but gumes are sore.  Has seen ENT mult times - had sinus surgery and recent CT.  Has a cholesteatoma in left ear - ENT dr advised her that this does not need any further eval/treatment.  Past Medical History  Diagnosis Date  . Dyslipidemia   . Depression   . Osteopenia   . Incisional hernia   . Stress incontinence, female   . Asthma   . ADD (attention deficit disorder)   . Environmental allergies   . GERD (gastroesophageal reflux disease)   . Ulcer    Current Outpatient Prescriptions on File Prior to Visit  Medication Sig Dispense Refill  . amphetamine-dextroamphetamine (ADDERALL) 20 MG tablet Take 1 tablet 2-3 times a day for ADD. 90 tablet 0  . DULoxetine (CYMBALTA) 60 MG capsule Take 30 mg by mouth 3 (three) times daily.     . Fluticasone-Salmeterol (ADVAIR) 250-50 MCG/DOSE AEPB Inhale 1 puff into the lungs 2 (two) times daily as needed.    . Multiple Vitamin (MULTIVITAMIN) tablet Take 1 tablet by mouth daily.      Marland Kitchen OVER THE COUNTER MEDICATION as needed. Tylenol 650 mg 2 tabs daily    . OVER THE COUNTER MEDICATION New Chapter Bone Strength taking 2-3 tabs daily with food    . simvastatin (ZOCOR) 40 MG tablet Take 1 tablet (40 mg total) by mouth daily. 30 tablet 5  . benzonatate (TESSALON) 100 MG capsule 1-2 tablets 3 times a  day as needed for cough (Patient not taking: Reported on 05/31/2014) 40 capsule 0  . OVER THE COUNTER MEDICATION OTC Magnesium Complex 2 caps daily with food     No current facility-administered medications on file prior to visit.   Allergies  Allergen Reactions  . Aspirin     History of ulcers  . Bee Venom       Review of Systems  Constitutional: Positive for fatigue. Negative for fever, chills, diaphoresis, activity change and appetite change.  HENT: Positive for congestion, ear pain, hearing loss, postnasal drip, rhinorrhea, sinus pressure and tinnitus. Negative for dental problem, ear discharge, nosebleeds, sneezing, sore throat, trouble swallowing and voice change.   Eyes: Negative for discharge and itching.  Respiratory: Positive for cough. Negative for shortness of breath.   Cardiovascular: Negative for chest pain.  Gastrointestinal: Negative for nausea, vomiting and abdominal pain.  Musculoskeletal: Positive for neck pain. Negative for neck stiffness.  Skin: Negative for color change and rash.  Neurological: Positive for headaches. Negative for dizziness and syncope.  Hematological: Positive for adenopathy.       Objective:  BP 126/78 mmHg  Pulse 98  Temp(Src) 98.1 F (36.7 C) (Oral)  Ht 5\' 2"  (  1.575 m)  Wt 175 lb 2 oz (79.436 kg)  BMI 32.02 kg/m2  SpO2 98%  LMP 08/30/2008  Physical Exam  Constitutional: She is oriented to person, place, and time. She appears well-developed and well-nourished. She appears lethargic. She appears ill. No distress.  HENT:  Head: Normocephalic and atraumatic.  Right Ear: External ear and ear canal normal. Tympanic membrane is injected. Tympanic membrane is not retracted. No middle ear effusion.  Left Ear: External ear and ear canal normal. Tympanic membrane is erythematous and retracted. A middle ear effusion is present.  Nose: Mucosal edema and rhinorrhea present. Right sinus exhibits maxillary sinus tenderness. Right sinus exhibits no  frontal sinus tenderness. Left sinus exhibits maxillary sinus tenderness. Left sinus exhibits no frontal sinus tenderness.  Mouth/Throat: Uvula is midline and mucous membranes are normal. Posterior oropharyngeal erythema present. No oropharyngeal exudate, posterior oropharyngeal edema or tonsillar abscesses.  Eyes: Conjunctivae are normal. Right eye exhibits no discharge. Left eye exhibits no discharge. No scleral icterus.  Neck: Normal range of motion. Neck supple.  Cardiovascular: Normal rate, regular rhythm, normal heart sounds and intact distal pulses.   Pulmonary/Chest: Effort normal and breath sounds normal.  Lymphadenopathy:       Head (right side): Submandibular adenopathy present. No preauricular and no posterior auricular adenopathy present.       Head (left side): Submandibular adenopathy present. No preauricular and no posterior auricular adenopathy present.    She has no cervical adenopathy.       Right: No supraclavicular adenopathy present.       Left: No supraclavicular adenopathy present.  Neurological: She is oriented to person, place, and time. She appears lethargic.  Skin: Skin is warm and dry. She is not diaphoretic. No erythema.  Psychiatric: She has a normal mood and affect. Her behavior is normal.          Assessment & Plan:   1. Eustachian tube dysfunction, left   Pt w/ h/o recurring AOM - she is leaving in sev days for 2 wk vacation - try IM DepoMedrol today to try to remove congestion and resolve mid-ear effusion.  If sxs worsen while on her trip I will call in a zpack for her.   Meds ordered this encounter  Medications  . mometasone (NASONEX) 50 MCG/ACT nasal spray    Sig: Place 2 sprays into the nose daily as needed.    Dispense:  17 g    Refill:  5  . loratadine (CLARITIN) 10 MG tablet    Sig: Take 1 tablet (10 mg total) by mouth daily.    Dispense:  90 tablet    Refill:  3  . methylPREDNISolone acetate (DEPO-MEDROL) injection 80 mg    Sig:   .  DISCONTD: pseudoephedrine (SUDAFED 12 HOUR) 120 MG 12 hr tablet    Sig: Take 1 tablet (120 mg total) by mouth 2 (two) times daily.    Dispense:  30 tablet    Refill:  0  . pseudoephedrine (SUDAFED 12 HOUR) 120 MG 12 hr tablet    Sig: Take 1 tablet (120 mg total) by mouth 2 (two) times daily.    Dispense:  30 tablet    Refill:  0    Delman Cheadle, MD MPH

## 2014-07-07 ENCOUNTER — Telehealth: Payer: Self-pay

## 2014-07-07 NOTE — Telephone Encounter (Signed)
Patient is calling because she needs a new PCP and was not notified that Dr. Leward Quan was retiring. Patient would like to possibly establish care with Dr. Brigitte Pulse and she also has questions about getting a referral to a dermatologist and optimalogist. Patient also requested to get the letter mailed to her about Dr. Leward Quan leaving. She is upset that she was not told and that she didin't receive the notice in the mail. Please call! [phone]

## 2014-07-10 NOTE — Telephone Encounter (Signed)
Called pt, unable to to reach pt.

## 2014-07-13 ENCOUNTER — Ambulatory Visit (INDEPENDENT_AMBULATORY_CARE_PROVIDER_SITE_OTHER): Payer: BC Managed Care – PPO | Admitting: Family Medicine

## 2014-07-13 ENCOUNTER — Ambulatory Visit (INDEPENDENT_AMBULATORY_CARE_PROVIDER_SITE_OTHER): Payer: BC Managed Care – PPO

## 2014-07-13 VITALS — BP 122/82 | HR 89 | Temp 98.1°F | Resp 18 | Ht 62.25 in | Wt 176.6 lb

## 2014-07-13 DIAGNOSIS — R05 Cough: Secondary | ICD-10-CM | POA: Diagnosis not present

## 2014-07-13 DIAGNOSIS — R059 Cough, unspecified: Secondary | ICD-10-CM

## 2014-07-13 DIAGNOSIS — Q828 Other specified congenital malformations of skin: Secondary | ICD-10-CM | POA: Diagnosis not present

## 2014-07-13 DIAGNOSIS — K432 Incisional hernia without obstruction or gangrene: Secondary | ICD-10-CM | POA: Diagnosis not present

## 2014-07-13 DIAGNOSIS — H938X2 Other specified disorders of left ear: Secondary | ICD-10-CM | POA: Diagnosis not present

## 2014-07-13 DIAGNOSIS — Z8739 Personal history of other diseases of the musculoskeletal system and connective tissue: Secondary | ICD-10-CM | POA: Diagnosis not present

## 2014-07-13 DIAGNOSIS — Z124 Encounter for screening for malignant neoplasm of cervix: Secondary | ICD-10-CM | POA: Diagnosis not present

## 2014-07-13 LAB — HM MAMMOGRAPHY: HM Mammogram: 4

## 2014-07-13 MED ORDER — BENZONATATE 100 MG PO CAPS: ORAL_CAPSULE | ORAL | Status: DC

## 2014-07-13 NOTE — Telephone Encounter (Signed)
Pt is here in the office today.

## 2014-07-13 NOTE — Patient Instructions (Addendum)
I will set up your bone density scan and dermatology referral Your chest x-ray looks fine- use the tessalon as needed, and let me know if this does not improve soon I will be in touch with your pap test results

## 2014-07-13 NOTE — Progress Notes (Signed)
[facility] [address], [place] [address] [phone]  Date:  07/13/2014   Name:  [name]   DOB:  30-Dec-1950   MRN:  [id number]  PCP:  Ellsworth Lennox, MD    Chief Complaint: Referral; Cough; and Ear Fullness   History of Present Illness:  [name] is a 64 y.o. very pleasant female patient who presents with the following:  She is here today with a few concerns.  She has tried to get a dexa scan done- her last was in 2012 in [place]; she would like for me to order this as Dr. Leward Quan is now retired  She did take fosamax for a while- she stopped taking it prior to 2012.  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   She would like to do a pap today.  She is not SA at this point but woud like to be again at some point She has some fullness in her left ear- she did have a couple of ear infections last year.   She has a ventral hernia- she had an operation in the past, and was dx with a small incisional hernia in 2009.  However it was not until last fall that it became larger following a period of coughing. She has seen a Psychologist, sport and exercise but is not sure of when she will have this repaired.  She has to follow-up with her surgeon on 6/24- she hopes to have this done this summer  She also notes a cough for about 10 days- she is coughing up some mucus.  She is concerned that this will worsen her hernia.  She needs more tessalon perles.   No fever or other symptoms of illness    Patient Active Problem List   Diagnosis Date Noted  . Sinusitis, chronic   . Asthma 01/08/2012  . Dyslipidemia   . ADD (attention deficit disorder)   . Depression   . Osteopenia   . Incisional hernia   . Stress incontinence, female     Past Medical History  Diagnosis Date  . Dyslipidemia   . Depression   . Osteopenia   . Incisional hernia   . Stress incontinence, female   . Asthma   . ADD (attention deficit disorder)   . Environmental allergies   . GERD (gastroesophageal reflux  disease)   . Ulcer     Past Surgical History  Procedure Laterality Date  . Tonsillectomy  1959  . Repair of perforated ulcer  1985  . Sling procedure  2005  . Cesarean section  1994  . Polypectomy      History  Substance Use Topics  . Smoking status: Never Smoker   . Smokeless tobacco: Never Used  . Alcohol Use: 0.0 oz/week    0 Standard drinks or equivalent per week     Comment: social    Family History  Problem Relation Age of Onset  . Arthritis Mother   . Heart disease Mother     CABG @ 31  . Diabetes Mother   . Osteoporosis Mother   . Arthritis Father   . Lymphoma Father 61    chemo  . Cancer Father     lymphoma  . Lymphoma Paternal Grandmother   . Diabetes Maternal Grandmother   . Hyperlipidemia Mother   . Lymphoma Father     Allergies  Allergen Reactions  . Aspirin     History of ulcers  . Bee Venom     Medication list has been  reviewed and updated.  Current Outpatient Prescriptions on File Prior to Visit  Medication Sig Dispense Refill  . amphetamine-dextroamphetamine (ADDERALL) 20 MG tablet Take 1 tablet 2-3 times a day for ADD. 90 tablet 0  . benzonatate (TESSALON) 100 MG capsule 1-2 tablets 3 times a day as needed for cough 40 capsule 0  . DULoxetine (CYMBALTA) 60 MG capsule Take 30 mg by mouth 3 (three) times daily.     . Fluticasone-Salmeterol (ADVAIR) 250-50 MCG/DOSE AEPB Inhale 1 puff into the lungs 2 (two) times daily as needed.    . loratadine (CLARITIN) 10 MG tablet Take 1 tablet (10 mg total) by mouth daily. 90 tablet 3  . mometasone (NASONEX) 50 MCG/ACT nasal spray Place 2 sprays into the nose daily as needed. 17 g 5  . Multiple Vitamin (MULTIVITAMIN) tablet Take 1 tablet by mouth daily.      Marland Kitchen OVER THE COUNTER MEDICATION as needed. Tylenol 650 mg 2 tabs daily    . OVER THE COUNTER MEDICATION OTC Magnesium Complex 2 caps daily with food    . OVER THE COUNTER MEDICATION [place] Bone Strength taking 2-3 tabs daily with food    .  pseudoephedrine (SUDAFED 12 HOUR) 120 MG 12 hr tablet Take 1 tablet (120 mg total) by mouth 2 (two) times daily. 30 tablet 0  . simvastatin (ZOCOR) 40 MG tablet Take 1 tablet (40 mg total) by mouth daily. 30 tablet 5   No current facility-administered medications on file prior to visit.    Review of Systems:  As per HPI- otherwise negative.   Physical Examination: Filed Vitals:   07/13/14 1418  BP: 122/82  Pulse: 89  Temp: 99.4 F (37.4 C)  Resp: 18   Filed Vitals:   07/13/14 1418  Height: 5' 2.25" (1.581 m)  Weight: 176 lb 9.6 oz (80.105 kg)   Body mass index is 32.05 kg/(m^2). Ideal Body Weight: Weight in (lb) to have BMI = 25: 137.5  GEN: WDWN, NAD, Non-toxic, A & O x 3, looks well, overweight HEENT: Atraumatic, Normocephalic. Neck supple. No masses, No LAD.  Bilateral TM wnl, oropharynx normal.  PEERL,EOMI.   Bilateral ears are entirely clear Ears and Nose: No external deformity. CV: RRR, No M/G/R. No JVD. No thrill. No extra heart sounds. PULM: CTA B, no wheezes, crackles, rhonchi. No retractions. No resp. distress. No accessory muscle use. ABD: S, NT, ND. No rebound. No HSM.  Ventral hernia EXTR: No c/c/e NEURO Normal gait.  PSYCH: Normally interactive. Conversant. Not depressed or anxious appearing.  Calm demeanor.  GU:  Normal external and internal exam, no CMT or adnexal masses or tenderness.    [facility] reading (PRIMARY) by  Dr. Lorelei Pont. CXR:  Negative  CHEST 2 VIEW  COMPARISON: PA and lateral chest 11/25/2013.  FINDINGS: The lungs are clear. Heart size is normal. There is no pneumothorax or pleural effusion. Mild appearing scoliosis is unchanged.  IMPRESSION: No acute disease.  Assessment and Plan: History of osteopenia - Plan: DG Bone Density, CANCELED: DG Bone Density  Ear fullness, left  Incisional hernia, without obstruction or gangrene  Cough - Plan: benzonatate (TESSALON) 100 MG capsule, DG Chest 2 View  Screening for cervical cancer -  Plan: Pap IG w/ reflex to HPV when ASC-U  Accessory skin tags - Plan: Ambulatory referral to Dermatology  Pap as above, reassured that her ears are normal She plans to see general surgery about her hernia. She feels that surgery is something she wants to pursue Cough  for about 10 days- no sign of bacterial infection.  She will use tessalon and let me know if not better Referral to derm for bothersome skin tags   Signed Lamar Blinks, MD

## 2014-07-14 ENCOUNTER — Encounter: Payer: Self-pay | Admitting: Family Medicine

## 2014-07-14 LAB — PAP IG W/ RFLX HPV ASCU

## 2014-07-24 ENCOUNTER — Other Ambulatory Visit: Payer: Self-pay | Admitting: Surgery

## 2014-07-28 ENCOUNTER — Ambulatory Visit (INDEPENDENT_AMBULATORY_CARE_PROVIDER_SITE_OTHER): Payer: BC Managed Care – PPO | Admitting: Podiatry

## 2014-07-28 ENCOUNTER — Ambulatory Visit (INDEPENDENT_AMBULATORY_CARE_PROVIDER_SITE_OTHER): Payer: BC Managed Care – PPO

## 2014-07-28 ENCOUNTER — Encounter: Payer: Self-pay | Admitting: Podiatry

## 2014-07-28 VITALS — BP 115/75 | HR 82 | Resp 15

## 2014-07-28 DIAGNOSIS — M779 Enthesopathy, unspecified: Secondary | ICD-10-CM | POA: Diagnosis not present

## 2014-07-28 DIAGNOSIS — M205X1 Other deformities of toe(s) (acquired), right foot: Secondary | ICD-10-CM

## 2014-07-28 DIAGNOSIS — M2010 Hallux valgus (acquired), unspecified foot: Secondary | ICD-10-CM | POA: Diagnosis not present

## 2014-07-28 MED ORDER — TRIAMCINOLONE ACETONIDE 10 MG/ML IJ SUSP
10.0000 mg | Freq: Once | INTRAMUSCULAR | Status: AC
  Administered 2014-07-28: 10 mg

## 2014-07-28 NOTE — Progress Notes (Signed)
   Subjective:    Patient ID: [name], female    DOB: March 22, 1950, 64 y.o.   MRN: [id number]  HPI Pt presents with bilateral foot pain, left over right. She has bilateral bunions, both MPJ joints are red and inflamed, right over left. B/L hammertoe deformity. Generalized pain in both feet, cramps that keep her up at night.   Review of Systems  Musculoskeletal: Positive for arthralgias.  All other systems reviewed and are negative.      Objective:   Physical Exam        Assessment & Plan:

## 2014-07-28 NOTE — Progress Notes (Signed)
Subjective:     Patient ID: [name], female   DOB: December 14, 1950, 64 y.o.   MRN: [id number]  HPI patient presents stating I just get pain in my feet in general bilateral and my right big toe joint congruence sore and I try to bend it and it feels like it is losing it's ability to band and the second toe is coming way up in the air and it also gets tender. My feet are becoming increasingly a problem for me and the orthotics I wore help me but I cannot wear them at all times   Review of Systems     Objective:   Physical Exam Neurovascular status intact with muscle strength adequate range of motion within normal limits. Patient's found to have reduced range of motion first MPJ right with crepitus within the joint and spurring dorsally along with on the medial side and a deviated rigidly contracted second toe right that's painful when pressed dorsally along with pain in both feet of a generalized nature.    Assessment:     Hallux limitus condition with structural spur formation right along with hammertoe deformity second digit right with rigid contracture and moderate depression of the arch bilateral    Plan:     H&P and x-rays reviewed of both feet. Today I did do an injection of the right first MPJ 3 mg Kenalog 5 mg Xylocaine and I want to see how this improves her and I went ahead and scanned for custom orthotics to reduce stress against her feet. Reappoint for [place] to reevaluate and we'll decide what may be necessary for her long-term including the possibility for surgical intervention of her right foot

## 2014-08-05 ENCOUNTER — Encounter: Payer: Self-pay | Admitting: Family Medicine

## 2014-08-10 ENCOUNTER — Encounter (HOSPITAL_COMMUNITY): Payer: Self-pay

## 2014-08-10 ENCOUNTER — Encounter (HOSPITAL_COMMUNITY)
Admission: RE | Admit: 2014-08-10 | Discharge: 2014-08-10 | Disposition: A | Discharge status: Home / Self Care | Payer: BC Managed Care – PPO | Source: Ambulatory Visit | Attending: Surgery | Admitting: Surgery

## 2014-08-10 DIAGNOSIS — M1990 Unspecified osteoarthritis, unspecified site: Secondary | ICD-10-CM | POA: Diagnosis not present

## 2014-08-10 DIAGNOSIS — Z886 Allergy status to analgesic agent status: Secondary | ICD-10-CM | POA: Diagnosis not present

## 2014-08-10 DIAGNOSIS — E780 Pure hypercholesterolemia: Secondary | ICD-10-CM | POA: Diagnosis not present

## 2014-08-10 DIAGNOSIS — F329 Major depressive disorder, single episode, unspecified: Secondary | ICD-10-CM | POA: Diagnosis not present

## 2014-08-10 DIAGNOSIS — Z91030 Bee allergy status: Secondary | ICD-10-CM | POA: Diagnosis not present

## 2014-08-10 DIAGNOSIS — K4090 Unilateral inguinal hernia, without obstruction or gangrene, not specified as recurrent: Secondary | ICD-10-CM | POA: Diagnosis not present

## 2014-08-10 DIAGNOSIS — J45909 Unspecified asthma, uncomplicated: Secondary | ICD-10-CM | POA: Diagnosis not present

## 2014-08-10 DIAGNOSIS — K219 Gastro-esophageal reflux disease without esophagitis: Secondary | ICD-10-CM | POA: Diagnosis not present

## 2014-08-10 DIAGNOSIS — K430 Incisional hernia with obstruction, without gangrene: Secondary | ICD-10-CM | POA: Diagnosis not present

## 2014-08-10 DIAGNOSIS — K432 Incisional hernia without obstruction or gangrene: Secondary | ICD-10-CM | POA: Diagnosis present

## 2014-08-10 HISTORY — DX: Unspecified osteoarthritis, unspecified site: M19.90

## 2014-08-10 LAB — CBC
HCT: 43.7 % (ref 36.0–46.0)
Hemoglobin: 14.5 g/dL (ref 12.0–15.0)
MCH: 30.1 pg (ref 26.0–34.0)
MCHC: 33.2 g/dL (ref 30.0–36.0)
MCV: 90.9 fL (ref 78.0–100.0)
Platelets: 292 10*3/uL (ref 150–400)
RBC: 4.81 MIL/uL (ref 3.87–5.11)
RDW: 13.6 % (ref 11.5–15.5)
WBC: 6.7 10*3/uL (ref 4.0–10.5)

## 2014-08-10 MED ORDER — CEFAZOLIN SODIUM-DEXTROSE 2-3 GM-% IV SOLR: 2.0000 g | INTRAVENOUS | Status: DC

## 2014-08-10 NOTE — Pre-Procedure Instructions (Signed)
    Maille Halliwell  08/10/2014      [id number] - [place], [place] - [address] [place] [address] Phone: [phone] Fax: [phone]    Your procedure is scheduled on 08-11-2014    Tuesday .  Report to [facility] Admitting at 5:30 A.M.   Call this number if you have problems the morning of surgery:  [phone]   Remember:  Do not eat food or drink liquids after midnight.   Take these medicines the morning of surgery with A SIP OF WATER  Tylenol if needed,cymbalta, Adderall if needed, advair if needed,nasonex if needed,claratin     Do not wear jewelry, make-up or nail polish.  Do not wear lotions, powders, or perfumes.    Do not shave 48 hours prior to surgery.     Do not bring valuables to the hospital.  [facility] is not responsible for any belongings or valuables.  Contacts, dentures or bridgework may not be worn into surgery.  Leave your suitcase in the car.  After surgery it may be brought to your room.  For patients admitted to the hospital, discharge time will be determined by your treatment team.  Patients discharged the day of surgery will not be allowed to drive home.    Special instructions:  See attach sheet "Preparing for Surgery" for instructions on CHG shower.  Please read over the following fact sheets that you were given. Pain Booklet, Coughing and Deep Breathing and Surgical Site Infection Prevention

## 2014-08-10 NOTE — H&P (Signed)
[name]  Location: [facility] Surgery Patient #: [id number] DOB: 1950/05/17 Undefined / Language: [name] / Race: White Female  History of Present Illness [name] A. Ninfa Linden MD;  Patient words: Hernia.  The patient is a 64 year old female who presents with an incisional hernia. This is a pleasant female referred by Dr. Ellsworth Holloway for evaluation of incisional hernia. She reports having had a hernia for at least 20 years but has recently become more symptomatic. She has had discomfort with coughing but no other obstructive symptoms. She is currently pain-free today. She denies any nausea or vomiting. She also reports having a known left inguinal hernia. She is otherwise healthy and without complaints   Other Problems Cynthia Holts, LPN;  Asthma Depression Gastric Ulcer Gastroesophageal Reflux Disease Hepatitis Hypercholesterolemia Inguinal Hernia Umbilical Hernia Repair  Past Surgical History Cynthia Holts, LPN; Cesarean Section - 1 Resection of Stomach Tonsillectomy  Diagnostic Studies History Cynthia Holts, LPN; Colonoscopy 0-25 years ago Mammogram within last year Pap Smear 1-5 years ago  Allergies Cynthia Holts, LPN;  Aspirin *ANALGESICS - NonNarcotic*  Medication History Cynthia Holts, LPN; 42/70/6237 6:28 PM) Amphetamine-Dextroamphetamine (20MG  Tablet, Oral) Active. Hydrocodone-Homatropine (5-1.5MG /5ML Syrup, Oral) Active. Methylphenidate HCl (20MG  Tablet, Oral) Active. Benzonatate (200MG  Capsule, Oral) Active. DULoxetine HCl (20MG  Capsule DR Part, Oral) Active. Nasonex (50MCG/ACT Suspension, Nasal) Active. Simvastatin (40MG  Tablet, Oral) Active.  Social History Cynthia Holts, LPN;  Alcohol use Occasional alcohol use. Caffeine use Carbonated beverages, Coffee, Tea. Illicit drug use Prefer to discuss with provider.  Family History Cynthia Holts, LPN;  Arthritis [name], [name]. Cancer  [name]. Diabetes Mellitus [name]. Heart Disease [name]. Heart disease in female family member before age 78 Heart disease in female family member before age 51 Migraine Headache [name]. Thyroid problems [name].  Pregnancy / Birth History Cynthia Holts, LPN;  Age at menarche 40 years. Age of menopause [age] Contraceptive History Oral contraceptives. Gravida 4 Irregular periods Maternal age 31-30 Para 1  Review of Systems Cynthia Holts LPN;  General Not Present- Appetite Loss, Chills, Fatigue, Fever, Night Sweats, Weight Gain and Weight Loss. Skin Not Present- Change in Wart/Mole, Dryness, Hives, Jaundice, New Lesions, Non-Healing Wounds, Rash and Ulcer. HEENT Present- Hearing Loss, Hoarseness, Oral Ulcers, Ringing in the Ears and Wears glasses/contact lenses. Not Present- Earache, Nose Bleed, Seasonal Allergies, Sinus Pain, Sore Throat, Visual Disturbances and Yellow Eyes. Respiratory Present- Chronic Cough. Not Present- Bloody sputum, Difficulty Breathing, Snoring and Wheezing. Breast Not Present- Breast Mass, Breast Pain, Nipple Discharge and Skin Changes. Gastrointestinal Present- Abdominal Pain. Not Present- Bloating, Bloody Stool, Change in Bowel Habits, Chronic diarrhea, Constipation, Difficulty Swallowing, Excessive gas, Gets full quickly at meals, Hemorrhoids, Indigestion, Nausea, Rectal Pain and Vomiting. Female Genitourinary Present- Nocturia. Not Present- Frequency, Painful Urination, Pelvic Pain and Urgency. Neurological Not Present- Decreased Memory, Fainting, Headaches, Numbness, Seizures, Tingling, Tremor, Trouble walking and Weakness. Psychiatric Present- Depression. Not Present- Anxiety, Bipolar, Change in Sleep Pattern, Fearful and Frequent crying. Endocrine Not Present- Cold Intolerance, Excessive Hunger, Hair Changes, Heat Intolerance, Hot flashes and New Diabetes. Hematology Not Present- Easy Bruising, Excessive bleeding, Gland problems, HIV and  Persistent Infections.   Vitals Cynthia Holts LPN;  31/51/7616 0:73 PM Weight: 171.31 lb Height: 62.5in Body Surface Area: 1.85 m Body Mass Index: 30.83 kg/m Temp.: 98.69F(Temporal)  BP: 142/92 (Sitting, Left Arm, Standard)    Physical Exam ([name] A. Ninfa Linden MD; General Mental Status-Alert. General Appearance-Consistent with stated age. Hydration-Well hydrated. Voice-Normal.  Head and Neck Head-normocephalic, atraumatic with no lesions or palpable masses. Trachea-midline.  Eye Eyeball - Bilateral-Extraocular movements intact. Sclera/Conjunctiva - Bilateral-No scleral icterus.  Chest and Lung Exam Chest and lung exam reveals -quiet, even and easy respiratory effort with no use of accessory muscles and on auscultation, normal breath sounds, no adventitious sounds and normal vocal resonance. Inspection Chest Wall - Normal. Back - normal.  Cardiovascular Cardiovascular examination reveals -normal heart sounds, regular rate and rhythm with no murmurs and normal pedal pulses bilaterally.  Abdomen Inspection Skin - Scar - no surgical scars. Hernias - Incisional - Incarcerated. Inguinal hernia - Left - Reducible. Palpation/Percussion Palpation and Percussion of the abdomen reveal - Soft, Non Tender, No Rebound tenderness, No Rigidity (guarding) and No hepatosplenomegaly. Auscultation Auscultation of the abdomen reveals - Bowel sounds normal.  Neurologic Neurologic evaluation reveals -alert and oriented x 3 with no impairment of recent or remote memory. Mental Status-Normal.  Musculoskeletal Normal Exam - Left-Upper Extremity Strength Normal and Lower Extremity Strength Normal. Normal Exam - Right-Upper Extremity Strength Normal, Lower Extremity Weakness.    Assessment & Plan ([name] A. Ninfa Linden MD;  INCISIONAL HERNIA, INCARCERATED [id number]  K43.0)  Impression: I discussed the diagnosis with her. I discussed surgical  repair versus continued observation. I believe her chance of this incarcerating bowel is quite small but the hernia is symptomatic and may get larger. I discussed both the laparoscopic and open techniques as well as use of mesh. I recommended laparoscopic incisional hernia repair with mesh and an open repair of the inguinal hernia as both fascial defects are quite far from each other. I discussed the risk of surgery which includes but is not limited to bleeding, infection, injury to surrounding structures, injury to the bowel, need to convert to an open procedure, recurrence, etc. I also discussed cardiopulmonary risks and postoperative recovery. She understands and is leaning toward surgery. INGUINAL HERNIA, LEFT (550.90  K40.90)

## 2014-08-11 ENCOUNTER — Encounter (HOSPITAL_COMMUNITY): Payer: Self-pay | Admitting: *Deleted

## 2014-08-11 ENCOUNTER — Observation Stay (HOSPITAL_COMMUNITY)
Admission: RE | Admit: 2014-08-11 | Discharge: 2014-08-13 | Disposition: A | Discharge status: Home / Self Care | Payer: BC Managed Care – PPO | Source: Ambulatory Visit | Attending: Surgery | Admitting: Surgery

## 2014-08-11 ENCOUNTER — Ambulatory Visit (HOSPITAL_COMMUNITY): Payer: BC Managed Care – PPO | Admitting: Certified Registered Nurse Anesthetist

## 2014-08-11 ENCOUNTER — Encounter (HOSPITAL_COMMUNITY)
Admission: RE | Disposition: A | Discharge status: Home / Self Care | Payer: Self-pay | Source: Ambulatory Visit | Attending: Surgery

## 2014-08-11 DIAGNOSIS — E780 Pure hypercholesterolemia: Secondary | ICD-10-CM | POA: Insufficient documentation

## 2014-08-11 DIAGNOSIS — Z91030 Bee allergy status: Secondary | ICD-10-CM | POA: Insufficient documentation

## 2014-08-11 DIAGNOSIS — K430 Incisional hernia with obstruction, without gangrene: Secondary | ICD-10-CM | POA: Diagnosis not present

## 2014-08-11 DIAGNOSIS — F329 Major depressive disorder, single episode, unspecified: Secondary | ICD-10-CM | POA: Insufficient documentation

## 2014-08-11 DIAGNOSIS — K4090 Unilateral inguinal hernia, without obstruction or gangrene, not specified as recurrent: Secondary | ICD-10-CM | POA: Insufficient documentation

## 2014-08-11 DIAGNOSIS — M1990 Unspecified osteoarthritis, unspecified site: Secondary | ICD-10-CM | POA: Insufficient documentation

## 2014-08-11 DIAGNOSIS — J45909 Unspecified asthma, uncomplicated: Secondary | ICD-10-CM | POA: Insufficient documentation

## 2014-08-11 DIAGNOSIS — K432 Incisional hernia without obstruction or gangrene: Secondary | ICD-10-CM | POA: Diagnosis present

## 2014-08-11 DIAGNOSIS — K219 Gastro-esophageal reflux disease without esophagitis: Secondary | ICD-10-CM | POA: Insufficient documentation

## 2014-08-11 DIAGNOSIS — Z886 Allergy status to analgesic agent status: Secondary | ICD-10-CM | POA: Insufficient documentation

## 2014-08-11 HISTORY — DX: Other asthma: J45.998

## 2014-08-11 HISTORY — DX: Personal history of other diseases of the digestive system: Z87.19

## 2014-08-11 HISTORY — DX: Thyrotoxicosis, unspecified without thyrotoxic crisis or storm: E05.90

## 2014-08-11 HISTORY — DX: Inflammatory liver disease, unspecified: K75.9

## 2014-08-11 HISTORY — DX: Cramp and spasm: R25.2

## 2014-08-11 HISTORY — DX: Personal history of peptic ulcer disease: Z87.11

## 2014-08-11 HISTORY — PX: LAPAROSCOPIC INCISIONAL / UMBILICAL / VENTRAL HERNIA REPAIR: SUR789

## 2014-08-11 HISTORY — DX: Personal history of other infectious and parasitic diseases: Z86.19

## 2014-08-11 HISTORY — PX: INCISIONAL HERNIA REPAIR: SHX193

## 2014-08-11 HISTORY — PX: INGUINAL HERNIA REPAIR: SHX194

## 2014-08-11 HISTORY — PX: INGUINAL HERNIA REPAIR: SUR1180

## 2014-08-11 HISTORY — DX: Pure hypercholesterolemia, unspecified: E78.00

## 2014-08-11 HISTORY — DX: Unspecified chronic bronchitis: J42

## 2014-08-11 HISTORY — PX: INSERTION OF MESH: SHX5868

## 2014-08-11 HISTORY — PX: HERNIA REPAIR: SHX51

## 2014-08-11 HISTORY — DX: Unspecified hearing loss, left ear: H91.92

## 2014-08-11 SURGERY — REPAIR, HERNIA, INCISIONAL, LAPAROSCOPIC
Anesthesia: General | Site: Groin

## 2014-08-11 MED ORDER — CEFAZOLIN SODIUM-DEXTROSE 2-3 GM-% IV SOLR
INTRAVENOUS | Status: AC
  Administered 2014-08-11: 2 g via INTRAVENOUS
  Filled 2014-08-11: qty 50

## 2014-08-11 MED ORDER — BUPIVACAINE-EPINEPHRINE 0.25% -1:200000 IJ SOLN
INTRAMUSCULAR | Status: DC | PRN
  Administered 2014-08-11: 20 mL

## 2014-08-11 MED ORDER — BUPIVACAINE-EPINEPHRINE (PF) 0.25% -1:200000 IJ SOLN
INTRAMUSCULAR | Status: AC
  Filled 2014-08-11: qty 30

## 2014-08-11 MED ORDER — FENTANYL CITRATE (PF) 250 MCG/5ML IJ SOLN
INTRAMUSCULAR | Status: AC
  Filled 2014-08-11: qty 5

## 2014-08-11 MED ORDER — ONDANSETRON HCL 4 MG PO TABS: 4.0000 mg | ORAL_TABLET | Freq: Four times a day (QID) | ORAL | Status: DC | PRN

## 2014-08-11 MED ORDER — PROPOFOL 10 MG/ML IV BOLUS
INTRAVENOUS | Status: DC | PRN
  Administered 2014-08-11: 160 mg via INTRAVENOUS

## 2014-08-11 MED ORDER — LACTATED RINGERS IV SOLN
INTRAVENOUS | Status: DC | PRN
  Administered 2014-08-11 (×2): via INTRAVENOUS

## 2014-08-11 MED ORDER — EPHEDRINE SULFATE 50 MG/ML IJ SOLN
INTRAMUSCULAR | Status: AC
  Filled 2014-08-11: qty 1

## 2014-08-11 MED ORDER — GLYCOPYRROLATE 0.2 MG/ML IJ SOLN
INTRAMUSCULAR | Status: DC | PRN
  Administered 2014-08-11: .8 mg via INTRAVENOUS

## 2014-08-11 MED ORDER — ONDANSETRON HCL 4 MG/2ML IJ SOLN
INTRAMUSCULAR | Status: DC | PRN
  Administered 2014-08-11: 4 mg via INTRAVENOUS

## 2014-08-11 MED ORDER — MEPERIDINE HCL 25 MG/ML IJ SOLN: 6.2500 mg | INTRAMUSCULAR | Status: DC | PRN

## 2014-08-11 MED ORDER — ONDANSETRON HCL 4 MG/2ML IJ SOLN: 4.0000 mg | Freq: Four times a day (QID) | INTRAMUSCULAR | Status: DC | PRN

## 2014-08-11 MED ORDER — OXYCODONE-ACETAMINOPHEN 5-325 MG PO TABS
1.0000 | ORAL_TABLET | ORAL | Status: DC | PRN
  Administered 2014-08-11 – 2014-08-13 (×9): 2 via ORAL
  Filled 2014-08-11 (×8): qty 2

## 2014-08-11 MED ORDER — HYDROMORPHONE HCL 1 MG/ML IJ SOLN
0.2500 mg | INTRAMUSCULAR | Status: DC | PRN
  Administered 2014-08-11 (×7): 0.5 mg via INTRAVENOUS

## 2014-08-11 MED ORDER — ROCURONIUM BROMIDE 100 MG/10ML IV SOLN
INTRAVENOUS | Status: DC | PRN
  Administered 2014-08-11: 40 mg via INTRAVENOUS

## 2014-08-11 MED ORDER — OXYCODONE-ACETAMINOPHEN 5-325 MG PO TABS
ORAL_TABLET | ORAL | Status: AC
  Filled 2014-08-11: qty 2

## 2014-08-11 MED ORDER — SUCCINYLCHOLINE CHLORIDE 20 MG/ML IJ SOLN
INTRAMUSCULAR | Status: AC
  Filled 2014-08-11: qty 1

## 2014-08-11 MED ORDER — HYDROMORPHONE HCL 1 MG/ML IJ SOLN
INTRAMUSCULAR | Status: AC
  Filled 2014-08-11: qty 1

## 2014-08-11 MED ORDER — DEXAMETHASONE SODIUM PHOSPHATE 4 MG/ML IJ SOLN
INTRAMUSCULAR | Status: AC
  Filled 2014-08-11: qty 1

## 2014-08-11 MED ORDER — LIDOCAINE HCL (CARDIAC) 20 MG/ML IV SOLN
INTRAVENOUS | Status: DC | PRN
  Administered 2014-08-11: 100 mg via INTRAVENOUS

## 2014-08-11 MED ORDER — DEXAMETHASONE SODIUM PHOSPHATE 4 MG/ML IJ SOLN
INTRAMUSCULAR | Status: DC | PRN
  Administered 2014-08-11: 4 mg via INTRAVENOUS

## 2014-08-11 MED ORDER — SUCCINYLCHOLINE CHLORIDE 20 MG/ML IJ SOLN
INTRAMUSCULAR | Status: AC
  Filled 2014-08-11: qty 2

## 2014-08-11 MED ORDER — POTASSIUM CHLORIDE IN NACL 20-0.9 MEQ/L-% IV SOLN
INTRAVENOUS | Status: DC
  Administered 2014-08-11: 16:00:00 via INTRAVENOUS
  Filled 2014-08-11: qty 1000

## 2014-08-11 MED ORDER — ROCURONIUM BROMIDE 50 MG/5ML IV SOLN
INTRAVENOUS | Status: AC
  Filled 2014-08-11: qty 4

## 2014-08-11 MED ORDER — FENTANYL CITRATE (PF) 100 MCG/2ML IJ SOLN
INTRAMUSCULAR | Status: DC | PRN
  Administered 2014-08-11 (×2): 50 ug via INTRAVENOUS
  Administered 2014-08-11: 100 ug via INTRAVENOUS
  Administered 2014-08-11 (×2): 50 ug via INTRAVENOUS
  Administered 2014-08-11: 100 ug via INTRAVENOUS

## 2014-08-11 MED ORDER — ACETAMINOPHEN 10 MG/ML IV SOLN
INTRAVENOUS | Status: AC
  Administered 2014-08-11: 1000 mg
  Filled 2014-08-11: qty 100

## 2014-08-11 MED ORDER — NEOSTIGMINE METHYLSULFATE 10 MG/10ML IV SOLN
INTRAVENOUS | Status: DC | PRN
  Administered 2014-08-11: 5 mg via INTRAVENOUS

## 2014-08-11 MED ORDER — PROPOFOL 10 MG/ML IV BOLUS
INTRAVENOUS | Status: AC
  Filled 2014-08-11: qty 20

## 2014-08-11 MED ORDER — ENOXAPARIN SODIUM 40 MG/0.4ML ~~LOC~~ SOLN
40.0000 mg | SUBCUTANEOUS | Status: DC
  Administered 2014-08-12 – 2014-08-13 (×2): 40 mg via SUBCUTANEOUS
  Filled 2014-08-11 (×2): qty 0.4

## 2014-08-11 MED ORDER — ROCURONIUM BROMIDE 50 MG/5ML IV SOLN
INTRAVENOUS | Status: AC
  Filled 2014-08-11: qty 1

## 2014-08-11 MED ORDER — LIDOCAINE HCL (CARDIAC) 20 MG/ML IV SOLN
INTRAVENOUS | Status: AC
  Filled 2014-08-11: qty 30

## 2014-08-11 MED ORDER — MIDAZOLAM HCL 2 MG/2ML IJ SOLN
INTRAMUSCULAR | Status: AC
  Filled 2014-08-11: qty 2

## 2014-08-11 MED ORDER — SUCCINYLCHOLINE CHLORIDE 20 MG/ML IJ SOLN
INTRAMUSCULAR | Status: DC | PRN
  Administered 2014-08-11: 100 mg via INTRAVENOUS

## 2014-08-11 MED ORDER — MORPHINE SULFATE 2 MG/ML IJ SOLN
1.0000 mg | INTRAMUSCULAR | Status: DC | PRN
  Administered 2014-08-11 – 2014-08-13 (×6): 2 mg via INTRAVENOUS
  Filled 2014-08-11 (×6): qty 1

## 2014-08-11 MED ORDER — STERILE WATER FOR INJECTION IJ SOLN
INTRAMUSCULAR | Status: AC
  Filled 2014-08-11: qty 10

## 2014-08-11 MED ORDER — PROMETHAZINE HCL 25 MG/ML IJ SOLN: 6.2500 mg | INTRAMUSCULAR | Status: DC | PRN

## 2014-08-11 MED ORDER — MIDAZOLAM HCL 5 MG/5ML IJ SOLN
INTRAMUSCULAR | Status: DC | PRN
  Administered 2014-08-11: 2 mg via INTRAVENOUS

## 2014-08-11 MED ORDER — 0.9 % SODIUM CHLORIDE (POUR BTL) OPTIME
TOPICAL | Status: DC | PRN
  Administered 2014-08-11: 1000 mL

## 2014-08-11 SURGICAL SUPPLY — 67 items
APPLIER CLIP 5 13 M/L LIGAMAX5 (MISCELLANEOUS)
APPLIER CLIP ROT 10 11.4 M/L (STAPLE)
BLADE SURG 10 STRL SS (BLADE) ×3 IMPLANT
BLADE SURG 15 STRL LF DISP TIS (BLADE) IMPLANT
BLADE SURG 15 STRL SS (BLADE)
BLADE SURG ROTATE 9660 (MISCELLANEOUS) ×3 IMPLANT
CANISTER SUCTION 2500CC (MISCELLANEOUS) IMPLANT
CHLORAPREP W/TINT 26ML (MISCELLANEOUS) ×3 IMPLANT
CLIP APPLIE 5 13 M/L LIGAMAX5 (MISCELLANEOUS) IMPLANT
CLIP APPLIE ROT 10 11.4 M/L (STAPLE) IMPLANT
COVER SURGICAL LIGHT HANDLE (MISCELLANEOUS) ×3 IMPLANT
DECANTER SPIKE VIAL GLASS SM (MISCELLANEOUS) IMPLANT
DEVICE SECURE STRAP 25 ABSORB (INSTRUMENTS) ×6 IMPLANT
DEVICE TROCAR PUNCTURE CLOSURE (ENDOMECHANICALS) ×3 IMPLANT
DRAIN PENROSE 1/2X12 LTX STRL (WOUND CARE) IMPLANT
DRAPE LAPAROSCOPIC ABDOMINAL (DRAPES) IMPLANT
DRAPE LAPAROTOMY TRNSV 102X78 (DRAPE) IMPLANT
DRAPE UTILITY XL STRL (DRAPES) IMPLANT
ELECT CAUTERY BLADE 6.4 (BLADE) ×3 IMPLANT
ELECT REM PT RETURN 9FT ADLT (ELECTROSURGICAL) ×3
ELECTRODE REM PT RTRN 9FT ADLT (ELECTROSURGICAL) ×2 IMPLANT
GLOVE BIO SURGEON STRL SZ7 (GLOVE) ×3 IMPLANT
GLOVE BIOGEL PI IND STRL 6 (GLOVE) ×2 IMPLANT
GLOVE BIOGEL PI IND STRL 7.0 (GLOVE) ×4 IMPLANT
GLOVE BIOGEL PI IND STRL 7.5 (GLOVE) ×2 IMPLANT
GLOVE BIOGEL PI INDICATOR 6 (GLOVE) ×1
GLOVE BIOGEL PI INDICATOR 7.0 (GLOVE) ×2
GLOVE BIOGEL PI INDICATOR 7.5 (GLOVE) ×1
GLOVE SURG SIGNA 7.5 PF LTX (GLOVE) ×3 IMPLANT
GOWN STRL REUS W/ TWL LRG LVL3 (GOWN DISPOSABLE) ×4 IMPLANT
GOWN STRL REUS W/ TWL XL LVL3 (GOWN DISPOSABLE) ×2 IMPLANT
GOWN STRL REUS W/TWL LRG LVL3 (GOWN DISPOSABLE) ×2
GOWN STRL REUS W/TWL XL LVL3 (GOWN DISPOSABLE) ×1
KIT BASIN OR (CUSTOM PROCEDURE TRAY) ×3 IMPLANT
KIT ROOM TURNOVER OR (KITS) ×3 IMPLANT
LIQUID BAND (GAUZE/BANDAGES/DRESSINGS) ×6 IMPLANT
MARKER SKIN DUAL TIP RULER LAB (MISCELLANEOUS) ×3 IMPLANT
MESH PARIETEX PROGRIP LEFT (Mesh General) ×3 IMPLANT
MESH VENTRALIGHT ST 4.5IN (Mesh General) ×3 IMPLANT
NEEDLE HYPO 25GX1X1/2 BEV (NEEDLE) IMPLANT
NEEDLE SPNL 22GX3.5 QUINCKE BK (NEEDLE) ×3 IMPLANT
NS IRRIG 1000ML POUR BTL (IV SOLUTION) ×3 IMPLANT
PACK SURGICAL SETUP 50X90 (CUSTOM PROCEDURE TRAY) IMPLANT
PAD ARMBOARD 7.5X6 YLW CONV (MISCELLANEOUS) ×6 IMPLANT
PENCIL BUTTON HOLSTER BLD 10FT (ELECTRODE) ×3 IMPLANT
SCALPEL HARMONIC ACE (MISCELLANEOUS) ×3 IMPLANT
SCISSORS LAP 5X35 DISP (ENDOMECHANICALS) ×3 IMPLANT
SET IRRIG TUBING LAPAROSCOPIC (IRRIGATION / IRRIGATOR) IMPLANT
SLEEVE ENDOPATH XCEL 5M (ENDOMECHANICALS) ×3 IMPLANT
SPONGE LAP 18X18 X RAY DECT (DISPOSABLE) IMPLANT
SUT MON AB 4-0 PC3 18 (SUTURE) ×6 IMPLANT
SUT NOVA NAB GS-21 0 18 T12 DT (SUTURE) ×3 IMPLANT
SUT SILK 2 0 SH (SUTURE) IMPLANT
SUT VIC AB 2-0 CT1 27 (SUTURE) ×2
SUT VIC AB 2-0 CT1 TAPERPNT 27 (SUTURE) ×4 IMPLANT
SUT VIC AB 3-0 CT1 27 (SUTURE) ×1
SUT VIC AB 3-0 CT1 TAPERPNT 27 (SUTURE) ×2 IMPLANT
SYR CONTROL 10ML LL (SYRINGE) IMPLANT
TOWEL OR 17X24 6PK STRL BLUE (TOWEL DISPOSABLE) ×3 IMPLANT
TOWEL OR 17X26 10 PK STRL BLUE (TOWEL DISPOSABLE) ×3 IMPLANT
TRAY FOLEY CATH 16FR SILVER (SET/KITS/TRAYS/PACK) IMPLANT
TRAY LAPAROSCOPIC MC (CUSTOM PROCEDURE TRAY) ×3 IMPLANT
TROCAR XCEL NON-BLD 11X100MML (ENDOMECHANICALS) ×3 IMPLANT
TROCAR XCEL NON-BLD 5MMX100MML (ENDOMECHANICALS) ×3 IMPLANT
TUBE CONNECTING 12X1/4 (SUCTIONS) IMPLANT
TUBING INSUFFLATION (TUBING) ×3 IMPLANT
YANKAUER SUCT BULB TIP NO VENT (SUCTIONS) IMPLANT

## 2014-08-11 NOTE — Anesthesia Procedure Notes (Signed)
Procedure Name: Intubation Date/Time: 08/11/2014 7:33 AM Performed by: Ollen Bowl Pre-anesthesia Checklist: Patient identified, Emergency Drugs available, Suction available, Patient being monitored and Timeout performed Patient Re-evaluated:Patient Re-evaluated prior to inductionOxygen Delivery Method: Circle system utilized and Simple face mask Preoxygenation: Pre-oxygenation with 100% oxygen Intubation Type: IV induction Ventilation: Mask ventilation without difficulty Laryngoscope Size: Miller and 2 Grade View: Grade I Tube type: Oral Tube size: 7.0 mm Number of attempts: 1 Airway Equipment and Method: Patient positioned with wedge pillow and Stylet Secured at: 21 cm Tube secured with: Tape Dental Injury: Teeth and Oropharynx as per pre-operative assessment

## 2014-08-11 NOTE — Transfer of Care (Signed)
Immediate Anesthesia Transfer of Care Note  Patient: [name]  Procedure(s) Performed: Procedure(s): [place] (N/A) LEFT INGUINAL HERNIA REPAIR (Left) INSERTION OF MESH TO LEFT GROIN AND ABDOMEN (Left)  Patient Location: PACU  Anesthesia Type:General  Level of Consciousness: awake, alert  and oriented  Airway & Oxygen Therapy: Patient Spontanous Breathing and Patient connected to nasal cannula oxygen  Post-op Assessment: Report given to RN, Post -op Vital signs reviewed and stable and Patient moving all extremities X 4  Post vital signs: Reviewed and stable  Last Vitals:  Filed Vitals:   08/11/14 0859  BP: 137/90  Pulse: 92  Temp: 36.3 C  Resp: 26    Complications: No apparent anesthesia complications

## 2014-08-11 NOTE — Anesthesia Preprocedure Evaluation (Signed)
Anesthesia Evaluation  Patient identified by MRN, date of birth, ID band Patient awake    Reviewed: Allergy & Precautions, NPO status , Patient's Chart, lab work & pertinent test results  Airway Mallampati: II  TM Distance: >3 FB Neck ROM: Full    Dental no notable dental hx.    Pulmonary asthma ,  breath sounds clear to auscultation  Pulmonary exam normal       Cardiovascular negative cardio ROS Normal cardiovascular examRhythm:Regular Rate:Normal     Neuro/Psych negative neurological ROS  negative psych ROS   GI/Hepatic Neg liver ROS, GERD-  ,  Endo/Other  negative endocrine ROS  Renal/GU negative Renal ROS     Musculoskeletal  (+) Arthritis -,   Abdominal   Peds  Hematology negative hematology ROS (+)   Anesthesia Other Findings   Reproductive/Obstetrics negative OB ROS                             Anesthesia Physical Anesthesia Plan  ASA: II  Anesthesia Plan: General   Post-op Pain Management:    Induction: Intravenous  Airway Management Planned: Oral ETT  Additional Equipment:   Intra-op Plan:   Post-operative Plan: Extubation in OR  Informed Consent: I have reviewed the patients History and Physical, chart, labs and discussed the procedure including the risks, benefits and alternatives for the proposed anesthesia with the patient or authorized representative who has indicated his/her understanding and acceptance.   Dental advisory given  Plan Discussed with: CRNA  Anesthesia Plan Comments:         Anesthesia Quick Evaluation

## 2014-08-11 NOTE — Progress Notes (Signed)
Pt still c/o pain at 9/10 in abdomen. Dr.Germeroth notified. Orders received to give pt up to 2mg  more of Dilaudid IV for a total of 4mg  in pacu if needed. Will cont to monitor.

## 2014-08-11 NOTE — Op Note (Signed)
LAPAROSCOPIC INCISIONAL HERNIA REPAIR, LEFT INGUINAL HERNIA REPAIR, INSERTION OF MESH TO LEFT GROIN AND ABDOMEN  Procedure Note  [name] 08/11/2014   Pre-op Diagnosis: Incisional Hernia and Left Inguinal Hernia      Post-op Diagnosis: same  Procedure(s): LAPAROSCOPIC INCISIONAL HERNIA REPAIR WITH MESH LEFT INGUINAL HERNIA REPAIR INSERTION OF MESH TO LEFT GROIN AND ABDOMEN  Surgeon(s): Coralie Keens, MD  Anesthesia: General  Staff:  Circulator: Milas Kocher, RN Scrub Person: Dory P Day, RN; Jon Gills Kretschmaier Circulator Assistant: Dory Mamie Nick Day, RN  Estimated Blood Loss: Minimal                         Gregroy Dombkowski A   Date: 08/11/2014  Time: 8:46 AM

## 2014-08-11 NOTE — Anesthesia Postprocedure Evaluation (Signed)
Anesthesia Post Note  Patient: [name]  Procedure(s) Performed: Procedure(s) (LRB): LAPAROSCOPIC INCISIONAL HERNIA REPAIR (N/A) LEFT INGUINAL HERNIA REPAIR (Left) INSERTION OF MESH TO LEFT GROIN AND ABDOMEN (Left)  Anesthesia type: General  Patient location: PACU  Post pain: Pain level controlled  Post assessment: Post-op Vital signs reviewed  Last Vitals: BP 147/89 mmHg  Pulse 84  Temp(Src) 36.4 C (Oral)  Resp 19  Wt 178 lb 3.2 oz (80.831 kg)  SpO2 98%  LMP 08/30/2008  Post vital signs: Reviewed  Level of consciousness: sedated  Complications: No apparent anesthesia complications

## 2014-08-11 NOTE — Interval H&P Note (Signed)
History and Physical Interval Note: no change in H and P  08/11/2014 6:43 AM  [name]  has presented today for surgery, with the diagnosis of Incisional Hernia and Left Inguinal Hernia   The various methods of treatment have been discussed with the patient and family. After consideration of risks, benefits and other options for treatment, the patient has consented to  Procedure(s): [place] (N/A) LEFT UNGUINAL HERNIA REPAIR WITH MESH (Left) as a surgical intervention .  The patient's history has been reviewed, patient examined, no change in status, stable for surgery.  I have reviewed the patient's chart and labs.  Questions were answered to the patient's satisfaction.     Artemio Dobie A

## 2014-08-12 ENCOUNTER — Encounter (HOSPITAL_COMMUNITY): Payer: Self-pay | Admitting: Surgery

## 2014-08-12 ENCOUNTER — Encounter: Payer: Self-pay | Admitting: *Deleted

## 2014-08-12 DIAGNOSIS — K430 Incisional hernia with obstruction, without gangrene: Secondary | ICD-10-CM | POA: Diagnosis not present

## 2014-08-12 MED ORDER — OXYCODONE HCL 5 MG PO TABS: 5.0000 mg | ORAL_TABLET | ORAL | Status: DC | PRN

## 2014-08-12 NOTE — Discharge Summary (Signed)
Physician Discharge Summary  Patient ID: [name] MRN: [id number] DOB/AGE: 1950-10-17 64 y.o.  Admit date: 08/11/2014 Discharge date: 08/12/2014  Admission Diagnoses:  Discharge Diagnoses:  Active Problems:   Incisional hernia   Discharged Condition: good  Hospital Course: Uneventful postop recovery s/p surgery.  Discharged pod #1  Consults: None  Significant Diagnostic Studies:   Treatments: surgery: laparoscopic incisional hernia repair with mesh, left inguinal hernia repair with mesh  Discharge Exam: Blood pressure 116/72, pulse 82, temperature 98.2 F (36.8 C), temperature source Oral, resp. rate 18, weight 80.831 kg (178 lb 3.2 oz), last menstrual period 08/30/2008, SpO2 94 %. General appearance: alert, cooperative and no distress Resp: clear to auscultation bilaterally Cardio: regular rate and rhythm, S1, S2 normal, no murmur, click, rub or gallop Incision/Wound:abd soft, incisions clean  Disposition: Final discharge disposition not confirmed     Medication List    TAKE these medications        acetaminophen 650 MG CR tablet  Commonly known as:  TYLENOL  Take 1,300 mg by mouth every 8 (eight) hours as needed for pain.     amphetamine-dextroamphetamine 20 MG tablet  Commonly known as:  ADDERALL  Take 1 tablet 2-3 times a day for ADD.     benzonatate 100 MG capsule  Commonly known as:  TESSALON  1-2 tablets 3 times a day as needed for cough     DULoxetine 60 MG capsule  Commonly known as:  CYMBALTA  Take 60 mg by mouth daily.     Fluticasone-Salmeterol 250-50 MCG/DOSE Aepb  Commonly known as:  ADVAIR  Inhale 1 puff into the lungs 2 (two) times daily as needed.     loratadine 10 MG tablet  Commonly known as:  CLARITIN  Take 1 tablet (10 mg total) by mouth daily.     mometasone 50 MCG/ACT nasal spray  Commonly known as:  NASONEX  Place 2 sprays into the nose daily as needed.     multivitamin tablet  Take 1 tablet by mouth daily.     OVER THE  COUNTER MEDICATION  Take 1 capsule by mouth 3 (three) times daily. OTC Magnesium Complex 2 caps daily with food     OVER THE COUNTER MEDICATION  New Chapter Bone Strength taking 2-3 tabs daily with food     oxyCODONE 5 MG immediate release tablet  Commonly known as:  Oxy IR/ROXICODONE  Take 1-2 tablets (5-10 mg total) by mouth every 4 (four) hours as needed for moderate pain, severe pain or breakthrough pain.     pseudoephedrine 120 MG 12 hr tablet  Commonly known as:  SUDAFED 12 HOUR  Take 1 tablet (120 mg total) by mouth 2 (two) times daily.     simvastatin 40 MG tablet  Commonly known as:  ZOCOR  Take 1 tablet (40 mg total) by mouth daily.           Follow-up Information    Follow up with [facility], MD. Schedule an appointment as soon as possible for a visit in 2 weeks.   Specialty:  General Surgery   Contact information:   [address] [place] [address] [phone]       Signed: Harl Bowie 08/12/2014, 7:32 AM

## 2014-08-12 NOTE — Discharge Instructions (Signed)
[facility] ______[facility] LAPAROSCOPIC SURGERY: POST OP INSTRUCTIONS Always review your discharge instruction sheet given to you by the facility where your surgery was performed. IF YOU HAVE DISABILITY OR FAMILY LEAVE FORMS, YOU MUST BRING THEM TO THE OFFICE FOR PROCESSING.   DO NOT GIVE THEM TO YOUR DOCTOR.  1. A prescription for pain medication may be given to you upon discharge.  Take your pain medication as prescribed, if needed.  If narcotic pain medicine is not needed, then you may take acetaminophen (Tylenol) or ibuprofen (Advil) as needed. 2. Take your usually prescribed medications unless otherwise directed. 3. If you need a refill on your pain medication, please contact your pharmacy.  They will contact our office to request authorization. Prescriptions will not be filled after 5pm or on week-ends. 4. You should follow a light diet the first few days after arrival home, such as soup and crackers, etc.  Be sure to include lots of fluids daily. 5. Most patients will experience some swelling and bruising in the area of the incisions.  Ice packs will help.  Swelling and bruising can take several days to resolve.  6. It is common to experience some constipation if taking pain medication after surgery.  Increasing fluid intake and taking a stool softener (such as Colace) will usually help or prevent this problem from occurring.  A mild laxative (Milk of Magnesia or Miralax) should be taken according to package instructions if there are no bowel movements after 48 hours. 7. Unless discharge instructions indicate otherwise, you may remove your bandages 24-48 hours after surgery, and you may shower at that time.  You may have steri-strips (small skin tapes) in place directly over the incision.  These strips should be left on the skin for 7-10 days.  If your surgeon used skin glue on the incision, you may shower in 24 hours.  The glue will flake off over the next 2-3 weeks.  Any sutures or  staples will be removed at the office during your follow-up visit. 8. ACTIVITIES:  You may resume regular (light) daily activities beginning the next day--such as daily self-care, walking, climbing stairs--gradually increasing activities as tolerated.  You may have sexual intercourse when it is comfortable.  Refrain from any heavy lifting or straining until approved by your doctor. a. You may drive when you are no longer taking prescription pain medication, you can comfortably wear a seatbelt, and you can safely maneuver your car and apply brakes. b. RETURN TO WORK:  __________________________________________________________ 9. You should see your doctor in the office for a follow-up appointment approximately 2-3 weeks after your surgery.  Make sure that you call for this appointment within a day or two after you arrive home to insure a convenient appointment time. 10. OTHER INSTRUCTIONS: __NO LIFTING MORE THAN 15 POUNDS FOR 4 WEEKS. 11. STOOL SOFTENER FOR CONSTIPATION. 12. ICE PACK ALSO FOR PAIN________________________________________________________________________________________________________________________ __________________________________________________________________________________________________________________________ WHEN TO CALL YOUR DOCTOR: 1. Fever over 101.0 2. Inability to urinate 3. Continued bleeding from incision. 4. Increased pain, redness, or drainage from the incision. 5. Increasing abdominal pain  The clinic staff is available to answer your questions during regular business hours.  Please don't hesitate to call and ask to speak to one of the nurses for clinical concerns.  If you have a medical emergency, go to the nearest emergency room or call 911.  A surgeon from [facility] is always on call at the hospital. [address] [place], [place], [place]  [address] ?  [id number], [place], [place]   [phone] ? [phone] ? [phone]  [phone] Web site: [email or web address]

## 2014-08-12 NOTE — Progress Notes (Signed)
Patient ID: [name], female   DOB: 21-Oct-1950, 64 y.o.   MRN: [id number]  Stable post op Wants to go home abd soft  Plan: discharge

## 2014-08-12 NOTE — Progress Notes (Signed)
Chaplain responded to consult for advanced directive.  Pt appears very tired and reports falling asleep while eating breakfast.  Food is still on table before pt in bed.  Pt wishes to complete advanced directive "on own time" and has been made aware of how to complete.  Pt is being discharged today and does not desire to complete paperwork before departure.  Chaplain available to follow up should pt change mind or further spiritual care needs arise.    08/12/14 0900  Clinical Encounter Type  Visited With Patient  Visit Type Initial  Referral From Nurse  Spiritual Encounters  Spiritual Needs Emotional  Stress Factors  Patient Stress Factors Exhausted  Advance Directives (For Healthcare)  Does patient have an advance directive? No  Would patient like information on creating an advanced directive? Yes - Scientist, clinical (histocompatibility and immunogenetics) given;No - patient declined information   Geralyn Flash 08/12/2014 9:28 AM

## 2014-08-12 NOTE — Op Note (Signed)
[name], [name]                 ACCOUNT NO.:  [id number]  MEDICAL RECORD NO.:  [id number]  LOCATION:  [id number]                        FACILITY:  [facility]  PHYSICIAN:  Coralie Keens, M.D. DATE OF BIRTH:  04/01/1950  DATE OF PROCEDURE:  08/11/2014 DATE OF DISCHARGE:                              OPERATIVE REPORT   PREOPERATIVE DIAGNOSES: 1. Incisional hernia. 2. Left inguinal hernia.  POSTOPERATIVE DIAGNOSES: 1. Incisional hernia. 2. Left inguinal hernia.  PROCEDURE: 1. Laparoscopic incisional hernia repair with mesh. 2. Left inguinal hernia repair with mesh.  SURGEON:  Coralie Keens, MD  ANESTHESIA:  General and 0.5% Marcaine.  ESTIMATED BLOOD LOSS:  Minimal.  INDICATIONS:  This is a 64 year old female who has an incisional hernia in her upper abdomen containing omentum.  This was just below the xiphoid.  She also has a left inguinal hernia.  The decision was then made to proceed with repair of the incisional hernia laparoscopically and the left inguinal hernia open, both with mesh.  FINDINGS:  The patient was found to have a small fascial defect in the upper midline incision containing incarcerated omentum, which was reduced.  It was repaired with an 11.4 cm round Ventralight mesh from Bard.  The left inguinal hernia was a direct hernia and was repaired with a piece of a Parietex Proceed ProGrip Prolene mesh.  PROCEDURE IN DETAIL:  The patient was brought to the operating room, identified as [name].  She was placed supine on the operating room table and general anesthesia was induced.  Her abdomen was then prepped and draped in usual sterile fashion.  I made a small incision in the patient's right upper quadrant with a scalpel.  I used a 5 mm camera and a 5-mm port and Optiview to slowly traverse all levels of the abdominal wall under direct vision and gained entrance in the peritoneal cavity. I then examined the entrance site and saw no evidence of bowel  injury. The patient had a small ventral hernia in the upper abdomen containing omentum.  I placed an 11 mm port in the patient's right mid abdomen and a 5 mm in the patient's right lower quadrant, both under direct vision. I was then able to reduce most of the omentum and then take down the rest from the hernia sac with several adhesions with the Harmonic Scalpel.  I then measured the fascial defect.  I brought 11.4 cm round Ventralight mesh patch from Bard onto the field.  I placed 4 separate 0 Novafil sutures in the corners of the mesh.  I then rolled the mesh up and sewed it in saline and placed it through the 11 mm trocar.  I then unrolled under direct vision in the abdomen.  I made 4 separate stab incision and then used the suture passer to pull all the sutures up to 4 separate locations in the abdominal wall.  I then pulled these tight pulling the mesh up against the peritoneum.  Wide coverage of the fascial defect appeared to be achieved.  This was greater than 4 cm circumferentially.  I then tied the sutures in place and then tacked the mesh in place with the Securestrap absorbable tacks.  At this point, hemostasis also appeared to be achieved.  I then deflated the abdomen, removed all trocars under direct vision.  Next, I made a small incision in the patient's left groin through a previous scar from her previous surgery.  I took this down through the Scarpa's fascia with electrocautery.  The external oblique fascia was then identified and opened with internal external rings.  The patient had a direct hernia sac, which I excised in its entirety.  I then closed the small fascial defect at the floor of the inguinal with a figure-of-eight 2-0 Vicryl suture.  I then brought a piece of Parietex ProGrip mesh onto the field. I placed it as an onlay on the inguinal floor while covering the fascial defect and internal external rings.  I then sewed this in place with interrupted 2-0 Vicryl  sutures.  I then closed the external oblique fascia over the top of the mesh with a 2-0 Vicryl suture as well.  I then anesthetized all wounds with Marcaine.  I closed the subcutaneous tissue with interrupted 3-0 Vicryl sutures and skin with a running 4-0 Monocryl in the groin.  I then closed all trocar sites with 4-0 Monocryl subcuticular sutures as well.  I then placed skin glue along all the incisions.  The patient tolerated the procedure well.  All the counts were correct at the end of procedure.  The patient was then extubated in the operating room and taken in stable condition to the recovery room.     Coralie Keens, M.D.     DB/MEDQ  D:  08/11/2014  T:  08/12/2014  Job:  [id number]

## 2014-08-13 DIAGNOSIS — K430 Incisional hernia with obstruction, without gangrene: Secondary | ICD-10-CM | POA: Diagnosis not present

## 2014-08-13 NOTE — Progress Notes (Signed)
Orthopedic Tech Progress Note Patient Details:  Ayanni Tun [birth year] [id number]  Ortho Devices Type of Ortho Device: Abdominal binder Ortho Device/Splint Location: abdomen Ortho Device/Splint Interventions: Loanne Drilling, Agusta Hackenberg 08/13/2014, 8:23 AM

## 2014-08-13 NOTE — Progress Notes (Signed)
Patient ID: [name], female   DOB: 1950-10-05, 64 y.o.   MRN: [id number] Less pain this morning Feels like she's ready to go home Abdomen soft  discharge

## 2014-08-13 NOTE — Discharge Planning (Signed)
Patient discharged home in stable condition. Verbalizes understanding of all discharge instructions, including home medications and follow up appointments. 

## 2014-08-14 ENCOUNTER — Encounter: Payer: Self-pay | Admitting: Family Medicine

## 2014-08-19 ENCOUNTER — Encounter: Payer: Self-pay | Admitting: Family Medicine

## 2014-08-25 ENCOUNTER — Ambulatory Visit: Payer: BC Managed Care – PPO | Admitting: *Deleted

## 2014-08-25 DIAGNOSIS — M779 Enthesopathy, unspecified: Secondary | ICD-10-CM

## 2014-08-25 NOTE — Patient Instructions (Signed)

## 2014-08-26 NOTE — Progress Notes (Signed)
Patient ID: [name], female   DOB: 1950/02/17, 64 y.o.   MRN: [id number] Patient presents for orthotic pick up.  Verbal and written break in and wear instructions given.  Patient will follow up in 4 weeks if symptoms worsen or fail to improve.

## 2014-09-21 ENCOUNTER — Ambulatory Visit: Payer: BC Managed Care – PPO | Admitting: Family Medicine

## 2014-09-30 ENCOUNTER — Telehealth: Payer: Self-pay | Admitting: *Deleted

## 2014-09-30 NOTE — Telephone Encounter (Signed)
Pt states her orthotic is causing a lot of pain and she is hobbling.  I instructed pt to rest, elevate, and ice and to wear a stiff bottom shoe if she had to be up and about and then transferred to schedulers for an appt tomorrow.

## 2014-10-01 ENCOUNTER — Telehealth: Payer: Self-pay | Admitting: *Deleted

## 2014-10-01 ENCOUNTER — Ambulatory Visit (INDEPENDENT_AMBULATORY_CARE_PROVIDER_SITE_OTHER): Payer: BC Managed Care – PPO | Admitting: Family Medicine

## 2014-10-01 ENCOUNTER — Encounter: Payer: Self-pay | Admitting: *Deleted

## 2014-10-01 ENCOUNTER — Ambulatory Visit (INDEPENDENT_AMBULATORY_CARE_PROVIDER_SITE_OTHER): Payer: BC Managed Care – PPO

## 2014-10-01 VITALS — BP 128/84 | HR 84 | Temp 99.2°F | Resp 18 | Ht 62.0 in | Wt 184.0 lb

## 2014-10-01 DIAGNOSIS — M79672 Pain in left foot: Secondary | ICD-10-CM

## 2014-10-01 DIAGNOSIS — M5441 Lumbago with sciatica, right side: Secondary | ICD-10-CM

## 2014-10-01 DIAGNOSIS — M21611 Bunion of right foot: Secondary | ICD-10-CM

## 2014-10-01 DIAGNOSIS — M2011 Hallux valgus (acquired), right foot: Secondary | ICD-10-CM

## 2014-10-01 MED ORDER — CYCLOBENZAPRINE HCL 5 MG PO TABS: 5.0000 mg | ORAL_TABLET | Freq: Every day | ORAL | Status: DC

## 2014-10-01 MED ORDER — PREDNISONE 20 MG PO TABS: ORAL_TABLET | ORAL | Status: DC

## 2014-10-01 NOTE — Patient Instructions (Signed)
Limit her walking at work is much as possible for the next 5 days

## 2014-10-01 NOTE — Progress Notes (Addendum)
This chart was scribed for Robyn Haber, MD by Royann Shivers Rifaie medical scribe at [facility].The patient was seen in exam room 11 and the patient's care was started at 5:50 PM.  Patient ID: [name] MRN: [id number], DOB: [birth year], 64 y.o. Date of Encounter: 10/01/2014  Primary Physician: Lamar Blinks, MD  Chief Complaint:  Chief Complaint  Patient presents with  . Back Pain    x 1 week, trouble walking  . Foot Swelling    HPI:  [name] is a 64 y.o. female who presents to [facility] complaining of right foot swelling, onset 1 weeks ago.  Pt notes that she was diagnosed of bone spur in her right foot, which she found much relief with, however after a travelling episode, she noticed bilateral foot swelling. She indicates that the area is not painful to the touch. She states that as a result she has suffered from trouble walking and moving, and she reports that this has possibly caused her back pain. Pt reports that wearing any kind of shoes exacerbates the pain, and she notes that walking bare feet does give her mild relief. Pt denies any joint pain in her arms. She is considering having a surgery for the symptoms.   Pt reports that she had her surgery in 07/12, and it went well. 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   Past Medical History  Diagnosis Date  . Dyslipidemia   . Depression   . Osteopenia   . Incisional hernia   . Stress incontinence, female   . ADD (attention deficit disorder)   . Environmental allergies   . GERD (gastroesophageal reflux disease)   . Ulcer   . Hypercholesterolemia   . Leg cramping   . Seasonal asthma   . Chronic bronchitis     "plenty; but not q yr" (08/11/2014)  . Hyperthyroidism 1994    "postpartum only; resolved itself"  . History of hiatal hernia   . History of  stomach ulcers   . Hepatitis     "exposed in college; rec'd gamma globulin; 6 months later S/S (weakness, lethargy, fevers); ; dx'd infectious hepatitis"  . Arthritis     "bunion on  right" (08/11/2014)  . History of herpes genitalis     "stress induced reoccurance that shows up on my left middle finger" (08/11/2014)  . Hearing difficulty of left ear     "grew up w/deaf parent; find myself lip reading more recently" (08/11/2014)     Home Meds: Prior to Admission medications   Medication Sig Start Date End Date Taking? Authorizing Provider  acetaminophen (TYLENOL) 650 MG CR tablet Take 1,300 mg by mouth every 8 (eight) hours as needed for pain.   Yes Historical Provider, MD  amphetamine-dextroamphetamine (ADDERALL) 20 MG tablet Take 1 tablet 2-3 times a day for ADD. 10/28/13  Yes Barton Fanny, MD  benzonatate (TESSALON) 100 MG capsule 1-2 tablets 3 times a day as needed for cough 07/13/14  Yes Jessica C Copland, MD  DULoxetine (CYMBALTA) 60 MG capsule Take 60 mg by mouth daily.    Yes Historical Provider, MD  Fluticasone-Salmeterol (ADVAIR) 250-50 MCG/DOSE AEPB Inhale 1 puff into the lungs 2 (two) times daily as needed. 01/08/12  Yes Rowe Clack, MD  loratadine (CLARITIN) 10 MG tablet Take 1 tablet (10 mg total) by  mouth daily. 05/31/14  Yes Shawnee Knapp, MD  mometasone (NASONEX) 50 MCG/ACT nasal spray Place 2 sprays into the nose daily as needed. 05/31/14  Yes Shawnee Knapp, MD  Multiple Vitamin (MULTIVITAMIN) tablet Take 1 tablet by mouth daily.     Yes Historical Provider, MD  OVER THE COUNTER MEDICATION Take 1 capsule by mouth 3 (three) times daily. OTC Magnesium Complex 2 caps daily with food   Yes Historical Provider, MD  OVER THE COUNTER MEDICATION New Chapter Bone Strength taking 2-3 tabs daily with food   Yes Historical Provider, MD  simvastatin (ZOCOR) 40 MG tablet Take 1 tablet (40 mg total) by mouth daily. 04/22/14  Yes Robyn Haber, MD  oxyCODONE (OXY IR/ROXICODONE) 5 MG  immediate release tablet Take 1-2 tablets (5-10 mg total) by mouth every 4 (four) hours as needed for moderate pain, severe pain or breakthrough pain. Patient not taking: Reported on 10/01/2014 08/12/14   Coralie Keens, MD  pseudoephedrine (SUDAFED 12 HOUR) 120 MG 12 hr tablet Take 1 tablet (120 mg total) by mouth 2 (two) times daily. Patient not taking: Reported on 08/10/2014 05/31/14   Shawnee Knapp, MD    Allergies:  Allergies  Allergen Reactions  . Aspirin     Other reaction(s): GI Intolerance History of ulcers  . Bee Venom     Any insects that bite or stings  . Nsaids Other (See Comments)    History of ulcers    Social History   Social History  . Marital Status: Divorced    Spouse Name: N/A  . Number of Children: N/A  . Years of Education: N/A   Occupational History  . Teaching assistant     Part-time   Social History Main Topics  . Smoking status: Former Research scientist (life sciences)  . Smokeless tobacco: Never Used     Comment: "smoked socially; maybe 2 cigarettes/yr;  quit in the 1990's"  . Alcohol Use: 1.2 oz/week    0 Standard drinks or equivalent, 1 Glasses of wine, 1 Shots of liquor per week     Comment: 08/11/2014 "0-2 glasses of wine or a mixed drink q wk"  . Drug Use: No  . Sexual Activity: No   Other Topics Concern  . Not on file   Social History Narrative     Review of Systems: Constitutional: negative for chills, fever, night sweats, weight changes, or fatigue  HEENT: negative for vision changes, hearing loss, congestion, rhinorrhea, ST, epistaxis, or sinus pressure Cardiovascular: negative for chest pain or palpitations Respiratory: negative for hemoptysis, wheezing, shortness of breath, or cough Abdominal: negative for abdominal pain, nausea, vomiting, diarrhea, or constipation Dermatological: negative for rash Neurologic: negative for headache, dizziness, or syncope Positive for Joint swelling, back pain.  Negative for arthralgia.  All other systems reviewed and are  otherwise negative with the exception to those above and in the HPI.  Physical Exam: Blood pressure 128/84, pulse 84, temperature 99.2 F (37.3 C), resp. rate 18, height 5\' 2"  (1.575 m), weight 184 lb (83.462 kg), last menstrual period 08/30/2008, SpO2 98 %., Body mass index is 33.65 kg/(m^2). General: Well developed, well nourished, in no acute distress. Head: Normocephalic, atraumatic, eyes without discharge, sclera non-icteric, nares are without discharge. Bilateral auditory canals clear, TM's are without perforation, pearly grey and translucent with reflective cone of light bilaterally. Oral cavity moist, posterior pharynx without exudate, erythema, peritonsillar abscess, or post nasal drip.  Neck: Supple. No thyromegaly. Full ROM. No lymphadenopathy. Lungs: Clear bilaterally to auscultation without  wheezes, rales, or rhonchi. Breathing is unlabored. Heart: RRR with S1 S2. No murmurs, rubs, or gallops appreciated. Abdomen: Soft, non-tender, non-distended with normoactive bowel sounds. No hepatomegaly. No rebound/guarding. No obvious abdominal masses. Msk:  Strength and tone normal for age. Extremities/Skin: Warm and dry. No clubbing or cyanosis. Right MTP is very red and minimally tender with decreased flexion. Left MTP of the great toe shows mild erythema and full range of motion. Left foot does have swelling and tenderness over the lateral aspect of the metatarsals Neuro: Alert and oriented X 3. Moves all extremities spontaneously. Gait is normal. CNII-XII grossly in tact. Psych:  Responds to questions appropriately with a normal affect.   [facility] reading (PRIMARY) by  Dr. Joseph Art:  Left foot. Moderate amount of arthritic changes in the great toe (MTP joint of the great toe)    ASSESSMENT AND PLAN:  63 y.o. year old female with  This chart was scribed in my presence and reviewed by me personally.    ICD-9-CM ICD-10-CM   1. Left foot pain 729.5 M79.672 DG Foot Complete Left  2. Low  back pain with right-sided sciatica, unspecified back pain laterality 724.3 M54.41   3. Bunion, right 727.1 M20.11       Signed, Robyn Haber, MD 10/01/2014 5:50 PM

## 2014-10-06 NOTE — Telephone Encounter (Signed)
Entered in error

## 2014-10-12 ENCOUNTER — Telehealth: Payer: Self-pay | Admitting: *Deleted

## 2014-10-13 ENCOUNTER — Telehealth: Payer: Self-pay | Admitting: *Deleted

## 2014-10-13 NOTE — Telephone Encounter (Signed)
Pt called to speak with Dr. Paulla Dolly.  [name] spole with pt.

## 2014-10-14 NOTE — Telephone Encounter (Signed)
Entered in error

## 2014-10-15 ENCOUNTER — Ambulatory Visit (INDEPENDENT_AMBULATORY_CARE_PROVIDER_SITE_OTHER): Payer: BC Managed Care – PPO | Admitting: Podiatry

## 2014-10-15 DIAGNOSIS — M205X1 Other deformities of toe(s) (acquired), right foot: Secondary | ICD-10-CM

## 2014-10-15 DIAGNOSIS — M109 Gout, unspecified: Secondary | ICD-10-CM

## 2014-10-15 DIAGNOSIS — M779 Enthesopathy, unspecified: Secondary | ICD-10-CM

## 2014-10-15 MED ORDER — TRIAMCINOLONE ACETONIDE 10 MG/ML IJ SUSP
10.0000 mg | Freq: Once | INTRAMUSCULAR | Status: AC
  Administered 2014-10-15: 10 mg

## 2014-10-15 MED ORDER — METHYLPREDNISOLONE 4 MG PO TBPK: ORAL_TABLET | ORAL | Status: DC

## 2014-10-16 LAB — C-REACTIVE PROTEIN: CRP: <0.5 mg/dL (ref <0.60)

## 2014-10-16 LAB — URIC ACID: URIC ACID, SERUM: 4.1 mg/dL (ref 2.4–7.0)

## 2014-10-16 LAB — ANA: ANA: NEGATIVE

## 2014-10-16 LAB — RHEUMATOID FACTOR: Rhuematoid fact SerPl-aCnc: <10 IU/mL (ref <=14)

## 2014-10-16 LAB — SEDIMENTATION RATE: Sed Rate: 7 mm/hr (ref 0–30)

## 2014-10-16 NOTE — Progress Notes (Signed)
Subjective:     Patient ID: [name], female   DOB: 1950-08-21, 64 y.o.   MRN: [id number]  HPI patient presents stating I'm getting a lot of pain that started suddenly in the sides of both my feet and I was on a short term sterile right which only gave me minimal relief and the pain is not as bad but still present and I know on getting need to get the big toe joint fixed on my right foot as I want to get it done as I have met my deductible for the year and I feel like it needs to be fixed   Review of Systems     Objective:   Physical Exam Neurovascular status intact muscle strength adequate range of motion within normal limits with patient noted to have inflammation pain in the first MPJ right with redness and also pain in the lateral side of both feet nondescript in nature that occurred suddenly with no indication of trauma    Assessment:     Possibility for systemic inflammation which may be present versus inflammatory tendinitis or compensation for hallux limitus deformity    Plan:     H&P condition reviewed with patient and today I did careful injections of the lateral complex 3 mg Kenalog 5 mill grams Xylocaine to try to reduce the discomfort she is experiencing advised on heat and ice therapy and went ahead today and discussed correction of the big toe joint which we'll in a do in the future. I first want to go ahead and send her for arthritic profile to see if there is any indication of systemic inflammation on Medrol Dosepak 6 states he try to reduce the inflammatory complex

## 2014-10-20 ENCOUNTER — Telehealth: Payer: Self-pay | Admitting: *Deleted

## 2014-10-20 NOTE — Telephone Encounter (Signed)
Dr. Paulla Dolly reviewed arthritic profile of 10/15/2014 as within normal limits, not a systemic inflammatory process, continue the medication as instructed.  Left message to call office for results.

## 2014-10-22 ENCOUNTER — Ambulatory Visit (INDEPENDENT_AMBULATORY_CARE_PROVIDER_SITE_OTHER): Payer: BC Managed Care – PPO | Admitting: Podiatry

## 2014-10-22 ENCOUNTER — Encounter: Payer: Self-pay | Admitting: Podiatry

## 2014-10-22 VITALS — BP 111/73 | HR 82 | Resp 16

## 2014-10-22 DIAGNOSIS — M205X1 Other deformities of toe(s) (acquired), right foot: Secondary | ICD-10-CM

## 2014-10-22 DIAGNOSIS — M779 Enthesopathy, unspecified: Secondary | ICD-10-CM

## 2014-10-23 NOTE — Progress Notes (Signed)
Subjective:     Patient ID: [name], female   DOB: [birth year], 64 y.o.   MRN: [id number]  HPI patient states I'm some improved but still having quite a bit of discomfort if I do a lot of walking.   Review of Systems     Objective:   Physical Exam Neurovascular status intact with patient's blood work reviewed with patient indicating that there does not appear to be systemic disease and this appears to be a localized inflammatory process. Continues to have mild discomfort lateral aspects of the feet but improved from previous and does have structural deformity first MPJ right with redness and pain that she is interested in getting corrected    Assessment:     Tendinitis bilateral which seems to be gradually proving with a inflammatory condition of the first MPJ right with hallux limitus and structural bunion deformity    Plan:     Reviewed both conditions and I want to see whether I can get 1 condition fully better before I consider treating the second. Today I dispensed anklet with instructions on using along with elevation heat and ice therapy and patient will be seen back in the next 4 weeks. Earlier if any issues should occur and we can discuss surgical intervention for the first MPJ at that time

## 2014-10-27 ENCOUNTER — Other Ambulatory Visit: Payer: Self-pay | Admitting: Family Medicine

## 2014-11-16 ENCOUNTER — Ambulatory Visit (INDEPENDENT_AMBULATORY_CARE_PROVIDER_SITE_OTHER): Payer: BC Managed Care – PPO | Admitting: Podiatry

## 2014-11-16 DIAGNOSIS — M205X1 Other deformities of toe(s) (acquired), right foot: Secondary | ICD-10-CM

## 2014-11-16 DIAGNOSIS — M2041 Other hammer toe(s) (acquired), right foot: Secondary | ICD-10-CM | POA: Diagnosis not present

## 2014-11-17 NOTE — Progress Notes (Signed)
Subjective:     Patient ID: [name], female   DOB: Sep 20, 1950, 64 y.o.   MRN: [id number]  HPI patient presents stating that I'm doing okay but I'm having a lot of pain around the big toe joint right and hammertoe deformity second right that I know I need to get fixed. 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 Patient wants surgery and wants to get it done over the holiday in his tenably scheduled for December and we'll reappoint for consult prior with all education given to patient today

## 2014-11-23 ENCOUNTER — Telehealth: Payer: Self-pay | Admitting: *Deleted

## 2014-11-23 NOTE — Telephone Encounter (Signed)
"  I'm tentatively scheduled for surgery in December.  I want to find out if Dr. Paulla Dolly could possibly do my surgery November 8th.  I'm off from work on November 8th and again on the 11th.  I need to find out right away so I can ask to be off.  Please call me.  I already have my take home pack.  We can maybe go over the details over the phone."

## 2014-11-24 ENCOUNTER — Telehealth: Payer: Self-pay | Admitting: *Deleted

## 2014-11-24 NOTE — Telephone Encounter (Signed)
I called [facility] and rescheduled surgery from 01/12/2015 to 12/08/2014.

## 2014-11-24 NOTE — Telephone Encounter (Signed)
I left patient a message that I'm going to reschedule her surgery to 12/08/2014.  You will need to schedule an appointment to come back in for a consultation prior to your surgery date.  Call if you have any further questions.

## 2014-12-01 ENCOUNTER — Telehealth: Payer: Self-pay | Admitting: *Deleted

## 2014-12-01 NOTE — Telephone Encounter (Signed)
I left patient a message to call and reschedule consultation appointment.  We rescheduled your surgery from 01/12/2015 to 12/08/2014.

## 2014-12-01 NOTE — Telephone Encounter (Signed)
"  I have some questions.  Will I be charged another $70 to come in there to sign some papers?"  Yes, you will be charged for a consultation.  "What exactly is he doing?  I have a bunion and a spur on the big toe and something wrong with the toe next to it."  He has you scheduled for an Camera operator.  "Does that include the spur?  My last notes I received said something about Hallux Limitus."  He will let you know when you come in for your consultation.  "He said I will be in a boot for 2 weeks then in a shoe.  Will I be able to drive in the boot?"  No, you will not be able to drive in the boot.  Depending on how your are recovering he may allow you to drive with a surgical shoe.  He will determine that during your follow-up.  "Well that plays a major factor of whether or not I do this.  I don't have anyone that will be able to drive me to work."  You can discuss this with him during your consultation and see what he suggests.  "Okay, I guess I'll schedule an appointment.  Can you see what he has available?"  I'll have to transfer you to a scheduler.    Jasmine informed me that Dr. Paulla Dolly doesn't have anything available until Monday.  Patient stated she cannot be out of work on Monday.  Dr. Paulla Dolly said he would consult with patient at the surgical center, the day of surgery.

## 2014-12-02 ENCOUNTER — Telehealth: Payer: Self-pay | Admitting: Podiatry

## 2014-12-02 ENCOUNTER — Encounter: Payer: Self-pay | Admitting: Family Medicine

## 2014-12-02 NOTE — Telephone Encounter (Signed)
I'm going to try to see her this morning

## 2014-12-02 NOTE — Telephone Encounter (Signed)
I called [name] and lvm for [name] to call me back to see if we could get [name] in today or Friday to see Dr Paulla Dolly reguarding her questions about upcoming surgery.

## 2014-12-04 NOTE — Telephone Encounter (Signed)
Per Dr Paulla Dolly he is to be calling pt and I have notified pt.

## 2014-12-07 ENCOUNTER — Encounter: Payer: BC Managed Care – PPO | Admitting: Family Medicine

## 2014-12-08 ENCOUNTER — Encounter: Payer: Self-pay | Admitting: Podiatry

## 2014-12-08 DIAGNOSIS — M2011 Hallux valgus (acquired), right foot: Secondary | ICD-10-CM | POA: Diagnosis not present

## 2014-12-08 DIAGNOSIS — M2041 Other hammer toe(s) (acquired), right foot: Secondary | ICD-10-CM | POA: Diagnosis not present

## 2014-12-09 ENCOUNTER — Telehealth: Payer: Self-pay | Admitting: *Deleted

## 2014-12-09 ENCOUNTER — Encounter: Payer: BC Managed Care – PPO | Admitting: Family Medicine

## 2014-12-09 NOTE — Telephone Encounter (Signed)
Pt states pain medication is not allowing to sleep, and is not covering the pain feels tight and throbby. Pt states she is sitting on the sofa letting her surgical foot hang down. I told pt to put her foot up on the sofa, take off the boot, remove the ace wrap only and elevate the foot 15 minutes and apply ice, after 15 minutes rewrap the ace looser and apply the boot. I told pt to keep boot on at all times, except to ice, and to only be on the foot 5 min/hr.  Pt states she feels the Oxycodone 10/325 are making her not sleepy, but she has Oxycodone 5/325 that help her sleep.  Dr. Paulla Dolly states may take the Oxycodone 5/325 as directed and break the Oxycodone 10 in half if need refills.  I spoke with pt again and she said she felt 150% better since she had elevated and removed the boot and ace wrap.  I gave pt Dr. Mellody Drown orders and pt states understanding then asked if she could take her combination cold medication like NiteQuil.  I told pt it was not a good idea to mix narcotic with combination medication and to stick with the Claritin, pt agreed and stated she was getting sleepy and was going to put the ace and boot back on.

## 2014-12-10 ENCOUNTER — Telehealth: Payer: Self-pay | Admitting: *Deleted

## 2014-12-10 NOTE — Telephone Encounter (Addendum)
Pt states she is having severe cramping in the 2nd toe that is not relieved by removing the ace or wriggling the toe, pt states also she is not getting over the cold and has a productive cough, would like to take her combination cold medicine like NiteQuil. I asked pt to check the color and temperature of her foot and toes.  Pt states the toes and foot are nice and pink and warm and feel much better out of the ace, but she is using it and the boot at all times except when she is resting, but not asleep.  I referred pt to her primary for the cold symptoms, but she said it's just the same cold she had before the surgery.  Dr. Paulla Dolly states pt may take the combination cold medicine as directed.  I informed pt.

## 2014-12-12 ENCOUNTER — Other Ambulatory Visit: Payer: Self-pay | Admitting: Family Medicine

## 2014-12-16 ENCOUNTER — Telehealth: Payer: Self-pay | Admitting: *Deleted

## 2014-12-16 NOTE — Telephone Encounter (Signed)
Pt states she has changed her appt to tomorrow morning and was wondering if she would be able to drive home, so she would only have to pay one [employer] driver.  Dr. Paulla Dolly states she will probably be able to drive in low surgical shoe.  Called pt and informed pt of Dr. Mellody Drown statement.  Pt states she has only had pain again since being back at work, and she cut away the end of the dressing that was coming over the ends of the toes, but left the remaining dressing intact.

## 2014-12-17 ENCOUNTER — Ambulatory Visit (INDEPENDENT_AMBULATORY_CARE_PROVIDER_SITE_OTHER): Payer: BC Managed Care – PPO | Admitting: Family Medicine

## 2014-12-17 ENCOUNTER — Encounter: Payer: Self-pay | Admitting: Podiatry

## 2014-12-17 ENCOUNTER — Other Ambulatory Visit: Payer: Self-pay

## 2014-12-17 ENCOUNTER — Ambulatory Visit (INDEPENDENT_AMBULATORY_CARE_PROVIDER_SITE_OTHER): Payer: BC Managed Care – PPO | Admitting: Podiatry

## 2014-12-17 ENCOUNTER — Ambulatory Visit (INDEPENDENT_AMBULATORY_CARE_PROVIDER_SITE_OTHER): Payer: BC Managed Care – PPO

## 2014-12-17 VITALS — BP 126/84 | HR 89 | Temp 98.2°F | Resp 16

## 2014-12-17 VITALS — BP 122/70 | HR 92 | Temp 98.1°F | Resp 18 | Ht 62.0 in | Wt 187.0 lb

## 2014-12-17 DIAGNOSIS — M542 Cervicalgia: Secondary | ICD-10-CM

## 2014-12-17 DIAGNOSIS — M205X1 Other deformities of toe(s) (acquired), right foot: Secondary | ICD-10-CM

## 2014-12-17 DIAGNOSIS — J3489 Other specified disorders of nose and nasal sinuses: Secondary | ICD-10-CM

## 2014-12-17 DIAGNOSIS — M2041 Other hammer toe(s) (acquired), right foot: Secondary | ICD-10-CM

## 2014-12-17 DIAGNOSIS — M8580 Other specified disorders of bone density and structure, unspecified site: Secondary | ICD-10-CM

## 2014-12-17 DIAGNOSIS — M21611 Bunion of right foot: Secondary | ICD-10-CM | POA: Diagnosis not present

## 2014-12-17 DIAGNOSIS — Z130 Encounter for screening for diseases of the blood and blood-forming organs and certain disorders involving the immune mechanism: Secondary | ICD-10-CM

## 2014-12-17 DIAGNOSIS — Z0000 Encounter for general adult medical examination without abnormal findings: Secondary | ICD-10-CM | POA: Diagnosis not present

## 2014-12-17 DIAGNOSIS — R05 Cough: Secondary | ICD-10-CM | POA: Diagnosis not present

## 2014-12-17 DIAGNOSIS — Z119 Encounter for screening for infectious and parasitic diseases, unspecified: Secondary | ICD-10-CM | POA: Diagnosis not present

## 2014-12-17 DIAGNOSIS — Z9889 Other specified postprocedural states: Secondary | ICD-10-CM

## 2014-12-17 DIAGNOSIS — R059 Cough, unspecified: Secondary | ICD-10-CM

## 2014-12-17 DIAGNOSIS — Z1329 Encounter for screening for other suspected endocrine disorder: Secondary | ICD-10-CM | POA: Diagnosis not present

## 2014-12-17 DIAGNOSIS — Z131 Encounter for screening for diabetes mellitus: Secondary | ICD-10-CM | POA: Diagnosis not present

## 2014-12-17 DIAGNOSIS — E785 Hyperlipidemia, unspecified: Secondary | ICD-10-CM | POA: Diagnosis not present

## 2014-12-17 LAB — POCT CBC
Granulocyte percent: 53.4 %G (ref 37–80)
HEMATOCRIT: 38.6 % (ref 37.7–47.9)
Hemoglobin: 13.2 g/dL (ref 12.2–16.2)
LYMPH, POC: 2.7 (ref 0.6–3.4)
MCH, POC: 30.4 pg (ref 27–31.2)
MCHC: 34.3 g/dL (ref 31.8–35.4)
MCV: 88.7 fL (ref 80–97)
MID (cbc): 0.3 (ref 0–0.9)
MPV: 7.2 fL (ref 0–99.8)
POC GRANULOCYTE: 3.4 (ref 2–6.9)
POC LYMPH %: 42.2 % (ref 10–50)
POC MID %: 4.4 % (ref 0–12)
Platelet Count, POC: 345 10*3/uL (ref 142–424)
RBC: 4.35 M/uL (ref 4.04–5.48)
RDW, POC: 14.5 %
WBC: 6.3 10*3/uL (ref 4.6–10.2)

## 2014-12-17 MED ORDER — CYCLOBENZAPRINE HCL 10 MG PO TABS: 10.0000 mg | ORAL_TABLET | Freq: Two times a day (BID) | ORAL | Status: DC | PRN

## 2014-12-17 MED ORDER — SIMVASTATIN 40 MG PO TABS: ORAL_TABLET | ORAL | Status: DC

## 2014-12-17 MED ORDER — IPRATROPIUM BROMIDE 0.03 % NA SOLN: 2.0000 | Freq: Four times a day (QID) | NASAL | Status: DC

## 2014-12-17 MED ORDER — ALBUTEROL SULFATE HFA 108 (90 BASE) MCG/ACT IN AERS
2.0000 | INHALATION_SPRAY | Freq: Four times a day (QID) | RESPIRATORY_TRACT | Status: DC | PRN
Start: 2014-12-17 — End: 2015-11-29

## 2014-12-17 MED ORDER — OXYCODONE-ACETAMINOPHEN 5-325 MG PO TABS: 1.0000 | ORAL_TABLET | Freq: Three times a day (TID) | ORAL | Status: DC | PRN

## 2014-12-17 NOTE — Progress Notes (Signed)
Subjective:     Patient ID: [name], female   DOB: May 11, 1950, 64 y.o.   MRN: [id number]  HPI patient states I'm doing well but I been dizzy on my foot and I do think it is swelling a little bit   Review of Systems     Objective:   Physical Exam Neurovascular status intact negative Homan sign was noted with well aligned first MPJ right and digital procedure second right with pin sticking out the end of the toe with mild elevation of the digit and wound edges well coapted with negative Homans sign noted    Assessment:     Doing well from structural correction of the right foot    Plan:     H&P conditions reviewed and recommended sterile dressings. At this time I reapplied sterile dressing I gave her advice on continued elevation immobilization and dispensed surgical shoe reviewed x-rays and reappoint to recheck in approximately 12 days

## 2014-12-17 NOTE — Patient Instructions (Addendum)
I will be in touch with your labs asap Your bone density scan from earlier this year showed low bone mass but you do not need to start any rx treatment at this point I will check your vitamin D level as we want to be sure you are not deficient Your blood count does not suggest any bacterial infection- I suspect your cough is more due to allergies; let's try an albuterol inhaler  You do have significant degenerative change in your neck and I would like to do an MRI for you- please let me know when they plan to remove the pin from your toe and I can schedule the MRI after this is removed.    Try the albuterol inhaler as needed for cough and wheezing Use the atrovent nasal as needed for nasal drip and drainage  Use the flexeril as needed for neck pain but it can make you feel sleepy- do not combine with any other medications that can make you sleepy We will get your records from Dr. Sabra Heck regarding your adderall and cymbalta

## 2014-12-17 NOTE — Progress Notes (Signed)
[facility] [address], [place] [address] [phone]  Date:  12/17/2014   Name:  [name]   DOB:  1950-11-14   MRN:  [id number]  PCP:  Lamar Blinks, MD    Chief Complaint: Annual Exam   History of Present Illness:  [name] is a 64 y.o. very pleasant female patient who presents with the following:  Here today for a CPE and multiple other concerns.   She had right foot surgery on 11/8- she had a bunion and bone spur removed.  She is in a CAM boot for now- it feels great to her and she will be in it for a while longer.  She feels like she is going great in this regard.  Still has a pin in her 2nd toe.   She is on medication for ADHD and depression/ FBM per Dr. Sabra Heck.  She would like for [place] to take over writing this medication as she is stable.  We did a records release for her today She takes adderall 1-2 daily; she has been written for 3 a day but never takes this much and her insurance will only cover #60 per month.   Also on zocor for cholesterol   Flu shot done for this year.   She has noted a "cold for about 2 weeks" now. She has noted a cough that can be productive off an on, she is coughing up some green material at times.  She is also blowing some out of her nose.  She does not have any fever or malaise.     She is fasting today for labs She is eating too much and wants to try and cut back.  Also she has not been able to exercise since her foot surgery.  Has gained some weight  Pap in June- it was normal Dexa in June- low bone mass. She has been treated with actonel in the past but was taken off this a few years ago.   She has multiple other concerns about various lesions on her skin; appear to be AKs.  She recently saw derm but did not realize that she wasn't scheduled for a complete skin check  She also notes that she will sometimes have pain in her left arm and her left neck.  It is non- exertional and she does not have any CP.  The  pain does seem to be positional and to go away with stretching her neck.  She wonders if she has a pinched nerve in her neck.   Wt Readings from Last 3 Encounters:  12/17/14 187 lb (84.823 kg)  10/01/14 184 lb (83.462 kg)  08/11/14 178 lb 3.2 oz (80.831 kg)    Patient Active Problem List   Diagnosis Date Noted  . Sinusitis, chronic   . Asthma 01/08/2012  . Dyslipidemia   . ADD (attention deficit disorder)   . Depression   . Osteopenia   . Incisional hernia   . Stress incontinence, female     Past Medical History  Diagnosis Date  . Dyslipidemia   . Depression   . Osteopenia   . Incisional hernia   . Stress incontinence, female   . ADD (attention deficit disorder)   . Environmental allergies   . GERD (gastroesophageal reflux disease)   . Ulcer   . Hypercholesterolemia   . Leg cramping   . Seasonal asthma   . Chronic bronchitis ([place])     "plenty; but not q yr" (08/11/2014)  .  Hyperthyroidism 1994    "postpartum only; resolved itself"  . History of hiatal hernia   . History of stomach ulcers   . Hepatitis     "exposed in college; rec'd gamma globulin; 6 months later S/S (weakness, lethargy, fevers); ; dx'd infectious hepatitis"  . Arthritis     "bunion on  right" (08/11/2014)  . History of herpes genitalis     "stress induced reoccurance that shows up on my left middle finger" (08/11/2014)  . Hearing difficulty of left ear     "grew up w/deaf parent; find myself lip reading more recently" (08/11/2014)    Past Surgical History  Procedure Laterality Date  . Repair of perforated ulcer  1985  . Incontinence surgery  2005  . Cesarean section  1994  . Nasal polyp excision  2014  . Hernia repair  08/11/2014  . Inguinal hernia repair Left 08/11/2014  . Laparoscopic incisional / umbilical / ventral hernia repair  08/11/2014    IHR  . Tonsillectomy    . Dilation and curettage of uterus  X 2  . Eye surgery    . Chalazion excision Left ~ 2013  . Incisional hernia repair N/A  08/11/2014    Procedure: LAPAROSCOPIC INCISIONAL HERNIA REPAIR;  Surgeon: Coralie Keens, MD;  Location: [place];  Service: General;  Laterality: N/A;  . Inguinal hernia repair Left 08/11/2014    Procedure: LEFT INGUINAL HERNIA REPAIR;  Surgeon: Coralie Keens, MD;  Location: [place];  Service: General;  Laterality: Left;  . Insertion of mesh Left 08/11/2014    Procedure: INSERTION OF MESH TO LEFT GROIN AND ABDOMEN;  Surgeon: Coralie Keens, MD;  Location: [place];  Service: General;  Laterality: Left;    Social History  Substance Use Topics  . Smoking status: Former Research scientist (life sciences)  . Smokeless tobacco: Never Used     Comment: "smoked socially; maybe 2 cigarettes/yr;  quit in the 1990's"  . Alcohol Use: 1.2 oz/week    0 Standard drinks or equivalent, 1 Glasses of wine, 1 Shots of liquor per week     Comment: 08/11/2014 "0-2 glasses of wine or a mixed drink q wk"    Family History  Problem Relation Age of Onset  . Arthritis Mother   . Heart disease Mother     CABG @ 73  . Diabetes Mother   . Osteoporosis Mother   . Arthritis Father   . Lymphoma Father 71    chemo  . Cancer Father     lymphoma  . Lymphoma Paternal Grandmother   . Diabetes Maternal Grandmother   . Hyperlipidemia Mother   . Lymphoma Father     Allergies  Allergen Reactions  . Aspirin     Other reaction(s): GI Intolerance History of ulcers  . Bee Venom     Any insects that bite or stings  . Nsaids Other (See Comments)    History of ulcers    Medication list has been reviewed and updated.  Current Outpatient Prescriptions on File Prior to Visit  Medication Sig Dispense Refill  . acetaminophen (TYLENOL) 650 MG CR tablet Take 1,300 mg by mouth every 8 (eight) hours as needed for pain.    Marland Kitchen amphetamine-dextroamphetamine (ADDERALL) 20 MG tablet Take 1 tablet 2-3 times a day for ADD. 90 tablet 0  . benzonatate (TESSALON) 100 MG capsule 1-2 tablets 3 times a day as needed for cough 60 capsule 0  . DULoxetine (CYMBALTA)  60 MG capsule Take 60 mg by mouth daily.     Marland Kitchen  Fluticasone-Salmeterol (ADVAIR) 250-50 MCG/DOSE AEPB Inhale 1 puff into the lungs 2 (two) times daily as needed.    . loratadine (CLARITIN) 10 MG tablet Take 1 tablet (10 mg total) by mouth daily. 90 tablet 3  . mometasone (NASONEX) 50 MCG/ACT nasal spray Place 2 sprays into the nose daily as needed. 17 g 5  . Multiple Vitamin (MULTIVITAMIN) tablet Take 1 tablet by mouth daily.      Marland Kitchen OVER THE COUNTER MEDICATION Take 1 capsule by mouth 3 (three) times daily. OTC Magnesium Complex 2 caps daily with food    . OVER THE COUNTER MEDICATION [place] Bone Strength taking 2-3 tabs daily with food    . oxyCODONE-acetaminophen (ROXICET) 5-325 MG tablet Take 1-2 tablets by mouth every 8 (eight) hours as needed for severe pain. 40 tablet 0  . simvastatin (ZOCOR) 40 MG tablet TAKE 1 TABLET BY MOUTH DAILY.  "NEEDS OFFICE VISIT/FASTING LABS FOR ADDITIONAL REFILLS" 30 tablet 0   No current facility-administered medications on file prior to visit.    Review of Systems:  As per HPI- otherwise negative.   Physical Examination: Filed Vitals:   12/17/14 1139  BP: 122/70  Pulse: 92  Temp: 98.1 F (36.7 C)  Resp: 18   Filed Vitals:   12/17/14 1139  Height: 5\' 2"  (1.575 m)  Weight: 187 lb (84.823 kg)   Body mass index is 34.19 kg/(m^2). Ideal Body Weight: Weight in (lb) to have BMI = 25: 136.4  GEN: WDWN, NAD, Non-toxic, A & O x 3, obese, looks well HEENT: Atraumatic, Normocephalic. Neck supple. No masses, No LAD.  Bilateral TM wnl, oropharynx normal.  PEERL,EOMI.   Ears and Nose: No external deformity. CV: RRR, No M/G/R. No JVD. No thrill. No extra heart sounds. PULM: CTA B, no wheezes, crackles, rhonchi. No retractions. No resp. distress. No accessory muscle use. ABD: S, NT, ND. No rebound. No HSM. EXTR: No c/c/e NEURO Normal gait except she is wearing a tall CAM on the right foot PSYCH: Normally interactive. Conversant. Not depressed or anxious  appearing.  Calm demeanor.    [facility] reading (PRIMARY) by  Dr. Lorelei Pont. Cervical spine: moderate to severe degenerative change with significant anterolisthesis of C7 on T1  EXAM: CERVICAL SPINE - COMPLETE 4+ VIEW  COMPARISON: None  FINDINGS: The cervical vertebrae are somewhat straightened in alignment. There is degenerative disc disease at C4-5, C5-6, C6-7, and C7-T1 levels with loss of disc space and sclerosis with spurring. There is a 3 mm anterolisthesis of C3 on C4, 3 mm retrolisthesis of C5 on C6, and 3 mm anterolisthesis of C7 on T1 all of which most likely are degenerative in origin. No prevertebral soft tissue swelling is seen. On oblique views, there is foraminal narrowing particularly at C5-6 and to a lesser degree at C6-7 bilaterally. The odontoid process is intact. The lung apices are clear.  IMPRESSION: 1. 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 2. Slight malalignments as described above most likely degenerative in origin. 3. Moderate foraminal narrowing bilaterally at C5-6 and C6-7.   Results for orders placed or performed in visit on 12/17/14  POCT CBC  Result Value Ref Range   WBC 6.3 4.6 - 10.2 K/uL   Lymph, poc 2.7 0.6 - 3.4   POC LYMPH PERCENT 42.2 10 - 50 %L   MID (cbc) 0.3 0 - 0.9   POC MID % 4.4 0 - 12 %M   POC Granulocyte 3.4 2 - 6.9   Granulocyte percent 53.4  37 - 80 %G   RBC 4.35 4.04 - 5.48 M/uL   Hemoglobin 13.2 12.2 - 16.2 g/dL   HCT, POC 38.6 37.7 - 47.9 %   MCV 88.7 80 - 97 fL   MCH, POC 30.4 27 - 31.2 pg   MCHC 34.3 31.8 - 35.4 g/dL   RDW, POC 14.5 %   Platelet Count, POC 345 142 - 424 K/uL   MPV 7.2 0 - 99.8 fL    Assessment and Plan: Physical exam  Hyperlipidemia - Plan: simvastatin (ZOCOR) 40 MG tablet, Lipid panel  Screening for hypothyroidism - Plan: TSH  Screening examination for infectious disease - Plan: Hepatitis C antibody  Screening for deficiency anemia - Plan: CANCELED: CBC  Osteopenia -  Plan: Vitamin D, 25-hydroxy  Screening for diabetes mellitus - Plan: Comprehensive metabolic panel  Neck pain on left side - Plan: DG Cervical Spine Complete, cyclobenzaprine (FLEXERIL) 10 MG tablet  Cough - Plan: POCT CBC, albuterol (PROVENTIL HFA;VENTOLIN HFA) 108 (90 BASE) MCG/ACT inhaler  Rhinorrhea - Plan: ipratropium (ATROVENT) 0.03 % nasal spray  Here today for a CPE and multiple other concerns Labs and refills as above History of osteopenia- most recent dexa looked good. Check vitamin D She has tried some flexeril in the past for her neck pain and it did help- refilled this for her.  Would like to proceed with an MRI but she needs to have the pin removed from her toe first.  She will keep me posted about this Likely URI or allergies causing cough and rhinorrhea; refilled her albuterol, will try atrovent nasal Will plan further follow- up pending labs.   Signed Lamar Blinks, MD

## 2014-12-18 ENCOUNTER — Other Ambulatory Visit: Payer: Self-pay

## 2014-12-18 ENCOUNTER — Encounter: Payer: Self-pay | Admitting: Family Medicine

## 2014-12-18 LAB — COMPREHENSIVE METABOLIC PANEL
ALBUMIN: 4.1 g/dL (ref 3.6–5.1)
ALK PHOS: 94 U/L (ref 33–130)
ALT: 59 U/L — AB (ref 6–29)
AST: 36 U/L — AB (ref 10–35)
BILIRUBIN TOTAL: 0.5 mg/dL (ref 0.2–1.2)
BUN: 25 mg/dL (ref 7–25)
CALCIUM: 9.1 mg/dL (ref 8.6–10.4)
CO2: 26 mmol/L (ref 20–31)
CREATININE: 0.73 mg/dL (ref 0.50–0.99)
Chloride: 104 mmol/L (ref 98–110)
Glucose, Bld: 89 mg/dL (ref 65–99)
Potassium: 4.6 mmol/L (ref 3.5–5.3)
SODIUM: 141 mmol/L (ref 135–146)
TOTAL PROTEIN: 6.5 g/dL (ref 6.1–8.1)

## 2014-12-18 LAB — LIPID PANEL
CHOLESTEROL: 176 mg/dL (ref 125–200)
HDL: 52 mg/dL (ref >=46)
LDL CALC: 108 mg/dL (ref <130)
TRIGLYCERIDES: 81 mg/dL (ref <150)
Total CHOL/HDL Ratio: 3.4 Ratio (ref <=5.0)
VLDL: 16 mg/dL (ref <30)

## 2014-12-18 LAB — HEPATITIS C ANTIBODY: HCV AB: NEGATIVE

## 2014-12-18 LAB — VITAMIN D 25 HYDROXY (VIT D DEFICIENCY, FRACTURES): Vit D, 25-Hydroxy: 30 ng/mL (ref 30–100)

## 2014-12-18 LAB — TSH: TSH: 0.864 u[IU]/mL (ref 0.350–4.500)

## 2014-12-21 ENCOUNTER — Ambulatory Visit (INDEPENDENT_AMBULATORY_CARE_PROVIDER_SITE_OTHER): Payer: BC Managed Care – PPO | Admitting: Podiatry

## 2014-12-21 DIAGNOSIS — Z9889 Other specified postprocedural states: Secondary | ICD-10-CM

## 2014-12-23 NOTE — Progress Notes (Signed)
Subjective:     Patient ID: [name], female   DOB: 1950/10/06, 64 y.o.   MRN: [id number]  HPI patient presents stating I'm doing fine just need my dressing changed again   Review of Systems     Objective:   Physical Exam Neurovascular status intact negative Homans sign noted with dressing that has slipped off the right forefoot with digits and good alignment wound edges well coapted    Assessment:     Doing well post surgery right foot with dressing that needs to be replaced    Plan:     Reapplied dressing today with sterile technique reappoint to recheck

## 2014-12-28 ENCOUNTER — Telehealth: Payer: Self-pay | Admitting: Family Medicine

## 2014-12-28 ENCOUNTER — Ambulatory Visit: Payer: Self-pay

## 2014-12-28 ENCOUNTER — Encounter: Payer: Self-pay | Admitting: Podiatry

## 2014-12-28 ENCOUNTER — Ambulatory Visit (INDEPENDENT_AMBULATORY_CARE_PROVIDER_SITE_OTHER): Payer: BC Managed Care – PPO | Admitting: Podiatry

## 2014-12-28 DIAGNOSIS — M205X1 Other deformities of toe(s) (acquired), right foot: Secondary | ICD-10-CM

## 2014-12-28 DIAGNOSIS — R200 Anesthesia of skin: Secondary | ICD-10-CM

## 2014-12-28 DIAGNOSIS — M542 Cervicalgia: Secondary | ICD-10-CM

## 2014-12-28 DIAGNOSIS — M21611 Bunion of right foot: Secondary | ICD-10-CM

## 2014-12-28 DIAGNOSIS — M2041 Other hammer toe(s) (acquired), right foot: Secondary | ICD-10-CM

## 2014-12-28 DIAGNOSIS — Z9889 Other specified postprocedural states: Secondary | ICD-10-CM

## 2014-12-28 DIAGNOSIS — R202 Paresthesia of skin: Principal | ICD-10-CM

## 2014-12-28 NOTE — Telephone Encounter (Signed)
Pt is needing a referral for MRI (Pin has been removed from foot)///PT needs MRI complete prior to 01/15/15.   [phone]

## 2014-12-29 ENCOUNTER — Telehealth: Payer: Self-pay | Admitting: *Deleted

## 2014-12-29 NOTE — Telephone Encounter (Signed)
The MRI was deemed not medically necessary by the insurance, but it still needs to process through [place] before the official determination is available for appeal.  Once that fax is received (presumably in the next couple days), I can begin the appeal process and send patient records to the insurance company.

## 2014-12-29 NOTE — Telephone Encounter (Signed)
Pt states her right surgery foot is a different color from the left.  I examined pt's right foot it was a pinker than the left, but no warmer and pt had not been wearing the ace wrap for compression.  I reassured the pt, that the pinkness was normal, and because she had been weight bearing and without compression to assist in the waste fluid returning from the right foot.  Pt states she walked from [facility].  I told pt to continue the hammer toe brace to the right 2nd toe and use the ace over it and wrap up the pt's leg for compression.

## 2014-12-29 NOTE — Telephone Encounter (Signed)
Placed order.  Called to let her know but number would not ring through

## 2014-12-30 NOTE — Progress Notes (Signed)
Subjective:     Patient ID: [name], female   DOB: 08/17/1950, 64 y.o.   MRN: [id number]  HPI patient states my toe got bonked and the pin has come out part way and it's been somewhat sore. I am working full time   Review of Systems     Objective:   Physical Exam Neurovascular status intact negative Homans sign noted with wound edges that are well coapted first and second metatarsal right with pin that has come out approximately 1 inch on the right second toe and is loose.    Assessment:     Traumatized right second toe with pin that is no longer acting to stabilize the digit    Plan:     Pin is removed second toe sterile dressing applied and advised on keeping the toe plantar flexed and dispensed a above ankle digital splint in order to stabilize the second toe in a plantarflexed position. Reappoint in the next several weeks or earlier if any issues should occur

## 2015-01-02 ENCOUNTER — Encounter: Payer: Self-pay | Admitting: Family Medicine

## 2015-01-04 ENCOUNTER — Ambulatory Visit: Payer: BC Managed Care – PPO | Admitting: Podiatry

## 2015-01-07 ENCOUNTER — Telehealth: Payer: Self-pay

## 2015-01-07 NOTE — Telephone Encounter (Signed)
Pt states her toes look funny, red, and peeling.  I told pt that the peeling was because she had had a very drying surgical solution and the dressing and brace were drying and the discoloration was because the surgical healing blood was dying and the vessel that take the waste away are small and it takes along time, that as long as the toe was warm and capillary refill was quick and no drainage all was fine.  Pt states the brace bothers her, I told her to make sure the toes were all in a flat plane top an bottom when in the brace the toe not pulled one down or up.

## 2015-01-07 NOTE — Telephone Encounter (Signed)
Patient returning call to referrals.   Also, would like her results   [phone]

## 2015-01-14 ENCOUNTER — Ambulatory Visit (INDEPENDENT_AMBULATORY_CARE_PROVIDER_SITE_OTHER): Payer: BC Managed Care – PPO

## 2015-01-14 ENCOUNTER — Encounter: Payer: Self-pay | Admitting: Podiatry

## 2015-01-14 ENCOUNTER — Ambulatory Visit (INDEPENDENT_AMBULATORY_CARE_PROVIDER_SITE_OTHER): Payer: BC Managed Care – PPO | Admitting: Podiatry

## 2015-01-14 VITALS — BP 120/78 | HR 86 | Resp 16

## 2015-01-14 DIAGNOSIS — M205X1 Other deformities of toe(s) (acquired), right foot: Secondary | ICD-10-CM

## 2015-01-14 DIAGNOSIS — M2041 Other hammer toe(s) (acquired), right foot: Secondary | ICD-10-CM

## 2015-01-19 ENCOUNTER — Ambulatory Visit
Admission: RE | Admit: 2015-01-19 | Discharge: 2015-01-19 | Disposition: A | Discharge status: Home / Self Care | Payer: BC Managed Care – PPO | Source: Ambulatory Visit | Attending: Family Medicine | Admitting: Family Medicine

## 2015-01-19 DIAGNOSIS — R200 Anesthesia of skin: Secondary | ICD-10-CM

## 2015-01-19 DIAGNOSIS — R202 Paresthesia of skin: Principal | ICD-10-CM

## 2015-01-19 DIAGNOSIS — M542 Cervicalgia: Secondary | ICD-10-CM

## 2015-01-20 ENCOUNTER — Encounter: Payer: Self-pay | Admitting: Family Medicine

## 2015-01-20 ENCOUNTER — Telehealth: Payer: Self-pay | Admitting: Family Medicine

## 2015-01-20 DIAGNOSIS — M542 Cervicalgia: Secondary | ICD-10-CM

## 2015-01-20 NOTE — Telephone Encounter (Signed)
Called her to go over her recent MRI.  LMOM with basics of the report, will try her back later  MRI CERVICAL SPINE WITHOUT CONTRAST  TECHNIQUE: Multiplanar, multisequence MR imaging of the cervical spine was performed. No intravenous contrast was administered.  COMPARISON: Cervical spine radiographs 12/17/2014  FINDINGS: Vertebral alignment is unchanged with grade 1 anterolisthesis again seen of C3 on C4 and C7 on T1 and grade 1 retrolisthesis of C5 on C6. Multilevel disc space narrowing is present, moderate to severe at C5-6 and C6-7. Associated degenerative endplate changes are present including minimal edema at C6-7. Vertebral body heights are preserved.  Craniocervical junction is unremarkable. There is a sub 5 mm focus of T2 hyperintensity in the right lateral spinal cord at C4. The cervical spinal cord is normal in signal elsewhere. Paraspinal soft tissues are unremarkable.  C2-3: Minimal left facet arthrosis without stenosis.  C3-4: Listhesis with disc uncovering, left uncovertebral hypertrophy, and advanced left facet arthrosis result in mild spinal stenosis and severe left neural foraminal stenosis.  C4-5: Mild disc bulging and uncovertebral spurring result in mild right neural foraminal stenosis without spinal stenosis.  C5-6: Broad-based posterior disc osteophyte complex results in borderline spinal stenosis and severe right and moderate left neural foraminal stenosis.  C6-7: Broad-based posterior disc osteophyte complex results in mild-to-moderate bilateral neural foraminal stenosis without spinal stenosis.  C7-T1: Listhesis with disc uncovering and mild to moderate facet arthrosis without significant stenosis.  IMPRESSION: 1. Moderate multilevel cervical disc degeneration with mild spinal stenosis at C3-4. 2. Severe right and moderate left neural foraminal stenosis at C5-6. 3. Mild-to-moderate bilateral foraminal stenosis at C6-7. 4. Small focus of  signal abnormality in the right lateral cord at C4 suggestive of a remote insult.

## 2015-01-21 ENCOUNTER — Other Ambulatory Visit: Payer: Self-pay | Admitting: Family Medicine

## 2015-01-21 ENCOUNTER — Telehealth: Payer: Self-pay | Admitting: Family Medicine

## 2015-01-21 ENCOUNTER — Ambulatory Visit (INDEPENDENT_AMBULATORY_CARE_PROVIDER_SITE_OTHER): Payer: BC Managed Care – PPO | Admitting: Family Medicine

## 2015-01-21 VITALS — BP 126/82 | HR 80 | Temp 97.7°F | Resp 16 | Ht 62.0 in | Wt 187.6 lb

## 2015-01-21 DIAGNOSIS — J4521 Mild intermittent asthma with (acute) exacerbation: Secondary | ICD-10-CM

## 2015-01-21 DIAGNOSIS — M5030 Other cervical disc degeneration, unspecified cervical region: Secondary | ICD-10-CM

## 2015-01-21 DIAGNOSIS — R05 Cough: Secondary | ICD-10-CM

## 2015-01-21 DIAGNOSIS — J0101 Acute recurrent maxillary sinusitis: Secondary | ICD-10-CM | POA: Diagnosis not present

## 2015-01-21 DIAGNOSIS — M4802 Spinal stenosis, cervical region: Secondary | ICD-10-CM | POA: Diagnosis not present

## 2015-01-21 DIAGNOSIS — R059 Cough, unspecified: Secondary | ICD-10-CM

## 2015-01-21 DIAGNOSIS — M542 Cervicalgia: Secondary | ICD-10-CM

## 2015-01-21 MED ORDER — BENZONATATE 100 MG PO CAPS: ORAL_CAPSULE | ORAL | Status: DC

## 2015-01-21 MED ORDER — HYDROCODONE-HOMATROPINE 5-1.5 MG/5ML PO SYRP: 5.0000 mL | ORAL_SOLUTION | Freq: Three times a day (TID) | ORAL | Status: DC | PRN

## 2015-01-21 MED ORDER — AMOXICILLIN-POT CLAVULANATE 875-125 MG PO TABS: 1.0000 | ORAL_TABLET | Freq: Two times a day (BID) | ORAL | Status: DC

## 2015-01-21 MED ORDER — PREDNISONE 20 MG PO TABS: ORAL_TABLET | ORAL | Status: DC

## 2015-01-21 NOTE — Telephone Encounter (Signed)
Patient is calling to see if she can get a refill for her cough medication. She states that she takes it every 8 hours and she starts coughing again about 3 hours after taking the medication. Patient states that in the past she was prescribed hydrocodone and she would like to know if it could be prescribed again.

## 2015-01-21 NOTE — Patient Instructions (Signed)
1.  Continue Ipratropium nasal spray twice daily. 2.  Continue Tessalon Perles three times daily for cough. 3.  Take Oxycodone for cough mostly at nighttime. 4.  Increase Albuterol to four times daily for cough. 5.  Take Augmentin and Prednisone as prescribed.   Sinusitis, Adult Sinusitis is redness, soreness, and inflammation of the paranasal sinuses. Paranasal sinuses are air pockets within the bones of your face. They are located beneath your eyes, in the middle of your forehead, and above your eyes. In healthy paranasal sinuses, mucus is able to drain out, and air is able to circulate through them by way of your nose. However, when your paranasal sinuses are inflamed, mucus and air can become trapped. This can allow bacteria and other germs to grow and cause infection. Sinusitis can develop quickly and last only a short time (acute) or continue over a long period (chronic). Sinusitis that lasts for more than 12 weeks is considered chronic. CAUSES Causes of sinusitis include:  Allergies.  Structural abnormalities, such as displacement of the cartilage that separates your nostrils (deviated septum), which can decrease the air flow through your nose and sinuses and affect sinus drainage.  Functional abnormalities, such as when the small hairs (cilia) that line your sinuses and help remove mucus do not work properly or are not present. SIGNS AND SYMPTOMS Symptoms of acute and chronic sinusitis are the same. The primary symptoms are pain and pressure around the affected sinuses. Other symptoms include:  Upper toothache.  Earache.  Headache.  Bad breath.  Decreased sense of smell and taste.  A cough, which worsens when you are lying flat.  Fatigue.  Fever.  Thick drainage from your nose, which often is green and may contain pus (purulent).  Swelling and warmth over the affected sinuses. DIAGNOSIS Your health care provider will perform a physical exam. During your exam, your  health care provider may perform any of the following to help determine if you have acute sinusitis or chronic sinusitis:  Look in your nose for signs of abnormal growths in your nostrils (nasal polyps).  Tap over the affected sinus to check for signs of infection.  View the inside of your sinuses using an imaging device that has a light attached (endoscope). If your health care provider suspects that you have chronic sinusitis, one or more of the following tests may be recommended:  Allergy tests.  Nasal culture. A sample of mucus is taken from your nose, sent to a lab, and screened for bacteria.  Nasal cytology. A sample of mucus is taken from your nose and examined by your health care provider to determine if your sinusitis is related to an allergy. TREATMENT Most cases of acute sinusitis are related to a viral infection and will resolve on their own within 10 days. Sometimes, medicines are prescribed to help relieve symptoms of both acute and chronic sinusitis. These may include pain medicines, decongestants, nasal steroid sprays, or saline sprays. However, for sinusitis related to a bacterial infection, your health care provider will prescribe antibiotic medicines. These are medicines that will help kill the bacteria causing the infection. Rarely, sinusitis is caused by a fungal infection. In these cases, your health care provider will prescribe antifungal medicine. For some cases of chronic sinusitis, surgery is needed. Generally, these are cases in which sinusitis recurs more than 3 times per year, despite other treatments. HOME CARE INSTRUCTIONS  Drink plenty of water. Water helps thin the mucus so your sinuses can drain more easily.  Use  a humidifier.  Inhale steam 3-4 times a day (for example, sit in the bathroom with the shower running).  Apply a warm, moist washcloth to your face 3-4 times a day, or as directed by your health care provider.  Use saline nasal sprays to help  moisten and clean your sinuses.  Take medicines only as directed by your health care provider.  If you were prescribed either an antibiotic or antifungal medicine, finish it all even if you start to feel better. SEEK IMMEDIATE MEDICAL CARE IF:  You have increasing pain or severe headaches.  You have nausea, vomiting, or drowsiness.  You have swelling around your face.  You have vision problems.  You have a stiff neck.  You have difficulty breathing.   This information is not intended to replace advice given to you by your health care provider. Make sure you discuss any questions you have with your health care provider.   Document Released: 01/16/2005 Document Revised: 02/06/2014 Document Reviewed: 01/31/2011 Elsevier Interactive Patient Education [employer].

## 2015-01-21 NOTE — Progress Notes (Signed)
Subjective:    Patient ID: [name], female    DOB: [birth year], 64 y.o.   MRN: [id number]  01/21/2015  Cough; Laryngitis; Sore Throat; Headache; and Medication Refill   HPI This 64 y.o. female presents for evaluation of cold symptoms.  Onset of cold symptoms four days ago.  Hoarseness developed after school.  Went to a meeting afterwards and voice worsened.  Coughing started; coughed all night.  Missed school on Tuesday; cough medication causes sedation.  Taking [employer]; needs refill.  Would like this to be gone.  Plans to go out of town.  Sat up all night.  Not using Albuterol because using in mornings only; has nasal Atrovent nasal spray daily.  Dayquil and Nyquil.  Did not get on the train this morning. Daughter in [place], [place].  Some wheezing. Gray sputum and PND.   Does not prefer Mucinex products; they make her feel weird.   Has not been taking Claritin.  Not taking Nasonex; out of Nasonex.  Has remaining oxycodone remaining from foot surgery.  Chest soreness due to coughing.  No SOB.    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 Now having L radicular symptoms that radiates into L jaw.  S/p MRI cervical spine that was ordered by Dr. Lorelei Pont.  Dr. Lorelei Pont recommended a NS consultation.  Previous NS recommended avoiding surgery fifteen years ago.    Dr. Leward Quan was previous PCP; now seeing Dr. Lorelei Pont.  Has seen Dr. Brigitte Pulse as well.     Review of Systems  Constitutional: Negative for fever, chills, diaphoresis and fatigue.  HENT: Positive for congestion, postnasal drip, rhinorrhea and voice change. Negative for sinus pressure, sore throat and trouble swallowing.   Eyes: Negative for visual disturbance.  Respiratory: Positive for cough and wheezing. Negative for shortness of breath.   Cardiovascular: Negative for chest pain, palpitations and leg swelling.  Gastrointestinal: Negative for nausea, vomiting, abdominal pain, diarrhea and constipation.    Endocrine: Negative for cold intolerance, heat intolerance, polydipsia, polyphagia and polyuria.  Musculoskeletal: Positive for neck pain and neck stiffness.  Neurological: Positive for weakness and numbness. Negative for dizziness, tremors, seizures, syncope, facial asymmetry, speech difficulty, light-headedness and headaches.    Past Medical History  Diagnosis Date  . Dyslipidemia   . Depression   . Osteopenia   . Incisional hernia   . Stress incontinence, female   . ADD (attention deficit disorder)   . Environmental allergies   . GERD (gastroesophageal reflux disease)   . Ulcer   . Hypercholesterolemia   . Leg cramping   . Seasonal asthma   . Chronic bronchitis ([place])     "plenty; but not q yr" (08/11/2014)  . Hyperthyroidism 1994    "postpartum only; resolved itself"  . History of hiatal hernia   . History of stomach ulcers   . Hepatitis     "exposed in college; rec'd gamma globulin; 6 months later S/S (weakness, lethargy, fevers); ; dx'd infectious hepatitis"  . Arthritis     "bunion on  right" (08/11/2014)  . History of herpes genitalis     "stress induced reoccurance that shows up on my left middle finger" (08/11/2014)  . Hearing difficulty of left ear     "grew up w/deaf parent; find myself lip reading more recently" (08/11/2014)   Past Surgical History  Procedure Laterality Date  . Repair of perforated ulcer  1985  . Incontinence surgery  2005  . Cesarean section  1994  .  Nasal polyp excision  2014  . Hernia repair  08/11/2014  . Inguinal hernia repair Left 08/11/2014  . Laparoscopic incisional / umbilical / ventral hernia repair  08/11/2014    [facility]  . Tonsillectomy    . Dilation and curettage of uterus  X 2  . Eye surgery    . Chalazion excision Left ~ 2013  . Incisional hernia repair N/A 08/11/2014    Procedure: LAPAROSCOPIC INCISIONAL HERNIA REPAIR;  Surgeon: Coralie Keens, MD;  Location: [facility];  Service: General;  Laterality: N/A;  . Inguinal hernia repair  Left 08/11/2014    Procedure: LEFT INGUINAL HERNIA REPAIR;  Surgeon: Coralie Keens, MD;  Location: [facility];  Service: General;  Laterality: Left;  . Insertion of mesh Left 08/11/2014    Procedure: INSERTION OF MESH TO LEFT GROIN AND ABDOMEN;  Surgeon: Coralie Keens, MD;  Location: [facility];  Service: General;  Laterality: Left;   Allergies  Allergen Reactions  . Aspirin     Other reaction(s): GI Intolerance History of ulcers  . Bee Venom     Any insects that bite or stings  . Nsaids Other (See Comments)    History of ulcers    Social History   Social History  . Marital Status: Divorced    Spouse Name: N/A  . Number of Children: N/A  . Years of Education: N/A   Occupational History  . Teaching assistant     Part-time   Social History Main Topics  . Smoking status: Former Research scientist (life sciences)  . Smokeless tobacco: Never Used     Comment: "smoked socially; maybe 2 cigarettes/yr;  quit in the 1990's"  . Alcohol Use: 1.2 oz/week    0 Standard drinks or equivalent, 1 Glasses of wine, 1 Shots of liquor per week     Comment: 08/11/2014 "0-2 glasses of wine or a mixed drink q wk"  . Drug Use: No  . Sexual Activity: No   Other Topics Concern  . Not on file   Social History Narrative   Family History  Problem Relation Age of Onset  . Arthritis Mother   . Heart disease Mother     CABG @ 80  . Diabetes Mother   . Osteoporosis Mother   . Arthritis Father   . Lymphoma Father 13    chemo  . Cancer Father     lymphoma  . Lymphoma Paternal Grandmother   . Diabetes Maternal Grandmother   . Hyperlipidemia Mother   . Lymphoma Father        Objective:    BP 126/82 mmHg  Pulse 80  Temp(Src) 97.7 F (36.5 C) (Oral)  Resp 16  Ht 5\' 2"  (1.575 m)  Wt 187 lb 9.6 oz (85.095 kg)  BMI 34.30 kg/m2  SpO2 98%  LMP 08/30/2008 Physical Exam  Constitutional: She is oriented to person, place, and time. She appears well-developed and well-nourished. No distress.  HENT:  Head: Normocephalic and  atraumatic.  Right Ear: Tympanic membrane and ear canal normal.  Left Ear: Tympanic membrane and ear canal normal.  Nose: Mucosal edema and rhinorrhea present. Right sinus exhibits no maxillary sinus tenderness and no frontal sinus tenderness. Left sinus exhibits no maxillary sinus tenderness and no frontal sinus tenderness.  Mouth/Throat: Uvula is midline and mucous membranes are normal. Posterior oropharyngeal erythema present.  Eyes: Conjunctivae are normal. Pupils are equal, round, and reactive to light.  Neck: Normal range of motion. Neck supple.  Cardiovascular: Normal rate, regular rhythm and normal heart sounds.  Exam reveals no gallop and no friction rub.   No murmur heard. Pulmonary/Chest: Effort normal and breath sounds normal. She has no wheezes. She has no rales.  Neurological: She is alert and oriented to person, place, and time.  Skin: She is not diaphoretic.  Psychiatric: She has a normal mood and affect. Her behavior is normal.  Nursing note and vitals reviewed.  MRI Cervical Spine Wo Contrast on 01/19/2015: IMPRESSION: 1. Moderate multilevel cervical disc degeneration with mild spinal stenosis at C3-4. 2. Severe right and moderate left neural foraminal stenosis at C5-6. 3. Mild-to-moderate bilateral foraminal stenosis at C6-7. 4. Small focus of signal abnormality in the right lateral cord at C4 suggestive of a remote insult    Assessment & Plan:   1. Acute recurrent maxillary sinusitis   2. Asthma with acute exacerbation, mild intermittent   3. Cough   4. Degenerative disc disease, cervical   5. Foraminal stenosis of cervical region    -Rx for Augmentin, Prednisone, Tessalon Perles provided. -Agree with Dr. Lorelei Pont that NS consultation indicated; Dr. Lorelei Pont has placed referral to NS.   No orders of the defined types were placed in this encounter.   Meds ordered this encounter  Medications  . benzonatate (TESSALON) 100 MG capsule    Sig: 1-2 tablets 3 times  a day as needed for cough    Dispense:  60 capsule    Refill:  0  . amoxicillin-clavulanate (AUGMENTIN) 875-125 MG tablet    Sig: Take 1 tablet by mouth 2 (two) times daily.    Dispense:  20 tablet    Refill:  0  . predniSONE (DELTASONE) 20 MG tablet    Sig: two tablets daily x 5 days then one tablet daily x 5 days    Dispense:  15 tablet    Refill:  0    No Follow-up on file.    Lyniah Fujita Elayne Guerin, M.D. [facility] [address] [place], [place]  [address] [phone] phone [phone] fax

## 2015-01-21 NOTE — Telephone Encounter (Signed)
I have written this but the patient must come and pick it up.

## 2015-01-21 NOTE — Telephone Encounter (Signed)
Patient states when she was seen earlier one of the things she listed on visit info was medication refill, Flexeril. When she picked up all of [facility] rx's at [facility] that was not one of them. Please advise?

## 2015-01-21 NOTE — Telephone Encounter (Signed)
Saw her in clinic in passing and went over her report.  She would like to see neurosurgery or a spine surgeon- will arrange this for her

## 2015-01-22 MED ORDER — CYCLOBENZAPRINE HCL 10 MG PO TABS: 10.0000 mg | ORAL_TABLET | Freq: Two times a day (BID) | ORAL | Status: DC | PRN

## 2015-01-22 NOTE — Telephone Encounter (Signed)
Refill for flexeril sent to pharmacy. 

## 2015-01-22 NOTE — Telephone Encounter (Signed)
Notified pt Rx ready for p/up on VM.

## 2015-01-24 NOTE — Progress Notes (Signed)
Subjective:     Patient ID: [name], female   DOB: 1950/10/25, 64 y.o.   MRN: [id number]  HPI patient states that she's doing well but she still gets some swelling at the end of the day   Review of Systems     Objective:   Physical Exam Neurovascular status intact muscle strength adequate with surgical sites on the right foot that are healing well with wound edges well coapted and moderate edema secondary to activity that this patient performs due to job duties    Assessment:     Doing well with continued edema in the right foot which is gradually improving with good alignment and good range of motion    Plan:     Advised on continuation of elevation compression and that edema should gradually reduce but and take time especially with her activity levels. Patient will be seen back for [place] to recheck in 4 weeks or earlier if necessary and reviewed x-rays with her

## 2015-02-17 ENCOUNTER — Ambulatory Visit (INDEPENDENT_AMBULATORY_CARE_PROVIDER_SITE_OTHER): Payer: BC Managed Care – PPO | Admitting: Podiatry

## 2015-02-17 ENCOUNTER — Encounter: Payer: Self-pay | Admitting: Podiatry

## 2015-02-17 ENCOUNTER — Ambulatory Visit (INDEPENDENT_AMBULATORY_CARE_PROVIDER_SITE_OTHER): Payer: BC Managed Care – PPO

## 2015-02-17 VITALS — BP 126/82 | HR 83 | Resp 16

## 2015-02-17 DIAGNOSIS — Z9889 Other specified postprocedural states: Secondary | ICD-10-CM | POA: Diagnosis not present

## 2015-02-17 DIAGNOSIS — M205X1 Other deformities of toe(s) (acquired), right foot: Secondary | ICD-10-CM | POA: Diagnosis not present

## 2015-02-17 DIAGNOSIS — M2041 Other hammer toe(s) (acquired), right foot: Secondary | ICD-10-CM

## 2015-02-18 ENCOUNTER — Other Ambulatory Visit: Payer: BC Managed Care – PPO

## 2015-02-18 NOTE — Progress Notes (Signed)
Subjective:     Patient ID: [name], female   DOB: October 30, 1950, 65 y.o.   MRN: [id number]  HPI patient states I'm in shoe now and I have minimal pain around the bunion site and the second toe is still swollen but not hurting but I was concerned because it was curving a little bit towards the third toe   Review of Systems     Objective:   Physical Exam Neurovascular status intact with negative Homans sign noted with patient having some stress on the second digit right and patient did have stress on the pin which caused early removal. The position of the toe is adequate but there is quite a bit of edema and the first MPJ is healing well with good range of motion    Assessment:     Doing well overall with stress on the second digit right but adequate positioning    Plan:     Explain that there still is healing to occur at the fusion site right second toe but hopeful long-term will heal or at least fibrosis in and the swelling will reduce but unfortunately it can take time with digits and will have to continue to monitor. Continue to keep the toe in a plantarflexed stable position and continue to work on range of motion first MPJ and reviewed x-rays and reappoint 6 weeks or earlier if any problems should occur. Continue wearing soft shoe gear

## 2015-02-19 ENCOUNTER — Encounter: Payer: Self-pay | Admitting: Family Medicine

## 2015-02-24 ENCOUNTER — Encounter: Payer: Self-pay | Admitting: Family Medicine

## 2015-03-06 ENCOUNTER — Other Ambulatory Visit: Payer: Self-pay | Admitting: Family Medicine

## 2015-03-30 ENCOUNTER — Telehealth: Payer: Self-pay

## 2015-03-30 NOTE — Telephone Encounter (Signed)
PT is asking for PA on amphetamine-dextroamphetamine (ADDERALL) 20 MG tablet w/ [facility] Prior auth. [id number]  [phone]//please call to advise if willing to provide PA on a historical provider  Pt states in discussion from last OV that she would be willing to manage ADDERALL and Duloxetine as she will not longer be under the care of Provider @ [facility]//// originally prescribed in 09/2013 by Leward Quan but recently been prescribed from [facility] by Dr. Lugo///  PT is requesting that Dr. Tamala Julian or clerical call an provide details supporting medical necessity/// Advised pt of walk-in hours

## 2015-03-30 NOTE — Telephone Encounter (Signed)
Call --- looks like patient discussed with Dr. Lorelei Pont in 12/2014 during her CPE regarding Dr. Lorelei Pont taking over management of Adderall and Cymbalta.  Dr. Lorelei Pont had patient complete a release for records from Dr. Sabra Heck.  I do not see any records in her chart.  Cannot complete prior authorization on Adderall at this time; pt will need to contact Dr. Sabra Heck for prior authorization since I have never discussed this topic with patient in detail.  I also assume that prescription was written by Dr. Sabra Heck; thus, he needs to be the one completing prior authorization and not this office.

## 2015-03-30 NOTE — Telephone Encounter (Signed)
Pt called back and had someone from her other office to fax over notes in regards to the prescription she is requesting.

## 2015-03-31 ENCOUNTER — Ambulatory Visit (INDEPENDENT_AMBULATORY_CARE_PROVIDER_SITE_OTHER): Payer: BC Managed Care – PPO | Admitting: Family Medicine

## 2015-03-31 ENCOUNTER — Telehealth: Payer: Self-pay

## 2015-03-31 ENCOUNTER — Ambulatory Visit (INDEPENDENT_AMBULATORY_CARE_PROVIDER_SITE_OTHER): Payer: BC Managed Care – PPO

## 2015-03-31 VITALS — BP 134/88 | HR 89 | Temp 98.4°F | Resp 18 | Ht 62.0 in | Wt 188.0 lb

## 2015-03-31 DIAGNOSIS — J4520 Mild intermittent asthma, uncomplicated: Secondary | ICD-10-CM | POA: Diagnosis not present

## 2015-03-31 DIAGNOSIS — M25562 Pain in left knee: Secondary | ICD-10-CM | POA: Diagnosis not present

## 2015-03-31 DIAGNOSIS — F32A Depression, unspecified: Secondary | ICD-10-CM

## 2015-03-31 DIAGNOSIS — N393 Stress incontinence (female) (male): Secondary | ICD-10-CM | POA: Diagnosis not present

## 2015-03-31 DIAGNOSIS — F988 Other specified behavioral and emotional disorders with onset usually occurring in childhood and adolescence: Secondary | ICD-10-CM

## 2015-03-31 DIAGNOSIS — F329 Major depressive disorder, single episode, unspecified: Secondary | ICD-10-CM

## 2015-03-31 DIAGNOSIS — F909 Attention-deficit hyperactivity disorder, unspecified type: Secondary | ICD-10-CM | POA: Diagnosis not present

## 2015-03-31 MED ORDER — SIMVASTATIN 40 MG PO TABS: 40.0000 mg | ORAL_TABLET | Freq: Every day | ORAL | Status: DC

## 2015-03-31 MED ORDER — DULOXETINE HCL 60 MG PO CPEP: 60.0000 mg | ORAL_CAPSULE | Freq: Every day | ORAL | Status: DC

## 2015-03-31 MED ORDER — AMPHETAMINE-DEXTROAMPHETAMINE 20 MG PO TABS: ORAL_TABLET | ORAL | Status: DC

## 2015-03-31 NOTE — Progress Notes (Signed)
Subjective:    Patient ID: [name], female    DOB: [birth year], 65 y.o.   MRN: [id number]  03/31/2015  Medication Refill; Weakness on Lt knee; and Fatigue   HPI This 65 y.o. female presents to establish care.  1. ADHD:  Followed by Dr. Sabra Heck.  Considers pt no longer a patient; will not complete the authorization.  Lugo prescribed Adderall in 12/2014.  Has gone to work for two days without medication.  Has been sleep deprived and hypersensitive.  Adderall 20mg  bid to tid.    #90.  Mail order will do 90 per month.  Must have prior authorization.  Current rx is in limbo.  Will take a verbal authorization.  Adderall for eight years.  2.  R bunionectomy and hammer toe surgery: 12-08-2014.      3.  Insomnia:  Not sleeping well; started with Prednisone and inhaler.  Still coughing with sputum.  Allergies.  Cleared up from [place]; traveled to [place] last week.  Using Benadryl with Tylenol and Tessalon Perles.    4. DDD cervical spine: s/p NS consultation; not ready for injections or surgery.  Bone spurs.  Does have some pain in L anterior shoulder.  Jabbing pain in shoulder.  Flexeril PRN.     Review of Systems Per HPI   Past Medical History  Diagnosis Date  . Dyslipidemia   . Depression   . Osteopenia   . Incisional hernia   . Stress incontinence, female   . ADD (attention deficit disorder)   . Environmental allergies   . GERD (gastroesophageal reflux disease)   . Ulcer   . Hypercholesterolemia   . Leg cramping   . Seasonal asthma   . Chronic bronchitis ([place])     "plenty; but not q yr" (08/11/2014)  . Hyperthyroidism 1994    "postpartum only; resolved itself"  . History of hiatal hernia   . History of stomach ulcers   . Hepatitis     "exposed in college; rec'd gamma globulin; 6 months later S/S (weakness, lethargy, fevers); ; dx'd infectious hepatitis"  . Arthritis     "bunion on  right" (08/11/2014)  . History of herpes genitalis     "stress induced reoccurance that shows up  on my left middle finger" (08/11/2014)  . Hearing difficulty of left ear     "grew up w/deaf parent; find myself lip reading more recently" (08/11/2014)   Past Surgical History  Procedure Laterality Date  . Repair of perforated ulcer  1985  . Incontinence surgery  2005  . Cesarean section  1994  . Nasal polyp excision  2014  . Hernia repair  08/11/2014  . Inguinal hernia repair Left 08/11/2014  . Laparoscopic incisional / umbilical / ventral hernia repair  08/11/2014    I[facility]  . Tonsillectomy    . Dilation and curettage of uterus  X 2  . Eye surgery    . Chalazion excision Left ~ 2013  . Incisional hernia repair N/A 08/11/2014    Procedure: LAPAROSCOPIC INCISIONAL HERNIA REPAIR;  Surgeon: Coralie Keens, MD;  Location: [employer] [facility];  Service: General;  Laterality: N/A;  . Inguinal hernia repair Left 08/11/2014    Procedure: LEFT INGUINAL HERNIA REPAIR;  Surgeon: Coralie Keens, MD;  Location: [facility];  Service: General;  Laterality: Left;  . Insertion of mesh Left 08/11/2014    Procedure: INSERTION OF MESH TO LEFT GROIN AND ABDOMEN;  Surgeon: Coralie Keens, MD;  Location: [place];  Service: General;  Laterality: Left;  Allergies  Allergen Reactions  . Aspirin     Other reaction(s): GI Intolerance History of ulcers  . Bee Venom     Any insects that bite or stings  . Nsaids Other (See Comments)    History of ulcers    Social History   Social History  . Marital Status: Divorced    Spouse Name: N/A  . Number of Children: N/A  . Years of Education: N/A   Occupational History  . Teaching assistant     Full time; [employer]   Social History Main Topics  . Smoking status: Former Research scientist (life sciences)  . Smokeless tobacco: Never Used     Comment: "smoked socially; maybe 2 cigarettes/yr;  quit in the 1990's"  . Alcohol Use: 1.2 oz/week    0 Standard drinks or equivalent, 1 Glasses of wine, 1 Shots of liquor per week     Comment: 08/11/2014 "0-2 glasses of wine or a mixed drink q wk"    . Drug Use: No  . Sexual Activity: No   Other Topics Concern  . Not on file   Social History Narrative   Family History  Problem Relation Age of Onset  . Arthritis Mother   . Heart disease Mother     CABG @ 46  . Diabetes Mother   . Osteoporosis Mother   . Arthritis Father   . Lymphoma Father 10    chemo  . Cancer Father     lymphoma  . Lymphoma Paternal Grandmother   . Diabetes Maternal Grandmother   . Hyperlipidemia Mother   . Lymphoma Father        Objective:    BP 134/88 mmHg  Pulse 89  Temp(Src) 98.4 F (36.9 C) (Oral)  Resp 18  Ht 5\' 2"  (1.575 m)  Wt 188 lb (85.276 kg)  BMI 34.38 kg/m2  SpO2 98%  LMP 08/30/2008 Physical Exam  Constitutional: She is oriented to person, place, and time. She appears well-developed and well-nourished. No distress.  HENT:  Head: Normocephalic and atraumatic.  Right Ear: External ear normal.  Left Ear: External ear normal.  Nose: Nose normal.  Mouth/Throat: Oropharynx is clear and moist.  Eyes: Conjunctivae and EOM are normal. Pupils are equal, round, and reactive to light.  Neck: Normal range of motion. Neck supple. Carotid bruit is not present. No thyromegaly present.  Cardiovascular: Normal rate, regular rhythm, normal heart sounds and intact distal pulses.  Exam reveals no gallop and no friction rub.   No murmur heard. Pulmonary/Chest: Effort normal and breath sounds normal. She has no wheezes. She has no rales.  Abdominal: Soft. Bowel sounds are normal. She exhibits no distension and no mass. There is no tenderness. There is no rebound and no guarding.  Lymphadenopathy:    She has no cervical adenopathy.  Neurological: She is alert and oriented to person, place, and time. No cranial nerve deficit.  Skin: Skin is warm and dry. No rash noted. She is not diaphoretic. No erythema. No pallor.  Psychiatric: She has a normal mood and affect. Her behavior is normal.   Results for orders placed or performed in visit on  12/17/14  Comprehensive metabolic panel  Result Value Ref Range   Sodium 141 135 - 146 mmol/L   Potassium 4.6 3.5 - 5.3 mmol/L   Chloride 104 98 - 110 mmol/L   CO2 26 20 - 31 mmol/L   Glucose, Bld 89 65 - 99 mg/dL   BUN 25 7 - 25 mg/dL  Creat 0.73 0.50 - 0.99 mg/dL   Total Bilirubin 0.5 0.2 - 1.2 mg/dL   Alkaline Phosphatase 94 33 - 130 U/L   AST 36 (H) 10 - 35 U/L   ALT 59 (H) 6 - 29 U/L   Total Protein 6.5 6.1 - 8.1 g/dL   Albumin 4.1 3.6 - 5.1 g/dL   Calcium 9.1 8.6 - 10.4 mg/dL  Lipid panel  Result Value Ref Range   Cholesterol 176 125 - 200 mg/dL   Triglycerides 81 <150 mg/dL   HDL 52 >=46 mg/dL   Total CHOL/HDL Ratio 3.4 <=5.0 Ratio   VLDL 16 <30 mg/dL   LDL Cholesterol 108 <130 mg/dL  TSH  Result Value Ref Range   TSH 0.864 0.350 - 4.500 uIU/mL  Vitamin D, 25-hydroxy  Result Value Ref Range   Vit D, 25-Hydroxy 30 30 - 100 ng/mL  Hepatitis C antibody  Result Value Ref Range   HCV Ab NEGATIVE NEGATIVE  POCT CBC  Result Value Ref Range   WBC 6.3 4.6 - 10.2 K/uL   Lymph, poc 2.7 0.6 - 3.4   POC LYMPH PERCENT 42.2 10 - 50 %L   MID (cbc) 0.3 0 - 0.9   POC MID % 4.4 0 - 12 %M   POC Granulocyte 3.4 2 - 6.9   Granulocyte percent 53.4 37 - 80 %G   RBC 4.35 4.04 - 5.48 M/uL   Hemoglobin 13.2 12.2 - 16.2 g/dL   HCT, POC 38.6 37.7 - 47.9 %   MCV 88.7 80 - 97 fL   MCH, POC 30.4 27 - 31.2 pg   MCHC 34.3 31.8 - 35.4 g/dL   RDW, POC 14.5 %   Platelet Count, POC 345 142 - 424 K/uL   MPV 7.2 0 - 99.8 fL       Assessment & Plan:   1. Left knee pain   2. ADD (attention deficit disorder)   3. Asthma, mild intermittent, uncomplicated   4. Depression   5. Stress incontinence, female     Orders Placed This Encounter  Procedures  . DG Knee Complete 4 Views Left    Standing Status: Future     Number of Occurrences: 1     Standing Expiration Date: 03/30/2016    Order Specific Question:  Reason for Exam (SYMPTOM  OR DIAGNOSIS REQUIRED)    Answer:  l knee  weakness/giving out; no trauma    Order Specific Question:  Preferred imaging location?    Answer:  External   Meds ordered this encounter  Medications  . DISCONTD: amphetamine-dextroamphetamine (ADDERALL) 20 MG tablet    Sig: Take 1 tablet 2-3 times a day for ADD.    Dispense:  90 tablet    Refill:  0  . amphetamine-dextroamphetamine (ADDERALL) 20 MG tablet    Sig: Take 1 tablet 2-3 times a day for ADD.    Dispense:  270 tablet    Refill:  0  . simvastatin (ZOCOR) 40 MG tablet    Sig: Take 1 tablet (40 mg total) by mouth daily.    Dispense:  90 tablet    Refill:  3  . DULoxetine (CYMBALTA) 60 MG capsule    Sig: Take 1 capsule (60 mg total) by mouth daily.    Dispense:  90 capsule    Refill:  3    No Follow-up on file.    Alexandre Lightsey Elayne Guerin, M.D. [facility] [address] [place], [place]  [address] [phone] phone [phone] fax

## 2015-03-31 NOTE — Telephone Encounter (Signed)
Brad called from Dr. Nani Gasser office and he will fax over ov notes

## 2015-03-31 NOTE — Patient Instructions (Addendum)
Because you received an x-ray today, you will receive an invoice from [facility]. Please contact [facility] at [phone] with questions or concerns regarding your invoice. Our billing staff will not be able to assist you with those questions.  Generic Knee Exercises EXERCISES RANGE OF MOTION (ROM) AND STRETCHING EXERCISES These exercises may help you when beginning to rehabilitate your injury. Your symptoms may resolve with or without further involvement from your physician, physical therapist, or athletic trainer. While completing these exercises, remember:   Restoring tissue flexibility helps normal motion to return to the joints. This allows healthier, less painful movement and activity.  An effective stretch should be held for at least 30 seconds.  A stretch should never be painful. You should only feel a gentle lengthening or release in the stretched tissue. STRETCH - Knee Extension, Prone  Lie on your stomach on a firm surface, such as a bed or countertop. Place your right / left knee and leg just beyond the edge of the surface. You may wish to place a towel under the far end of your right / left thigh for comfort.  Relax your leg muscles and allow gravity to straighten your knee. Your clinician may advise you to add an ankle weight if more resistance is helpful for you.  You should feel a stretch in the back of your right / left knee. Hold this position for __________ seconds. Repeat __________ times. Complete this stretch __________ times per day. * Your physician, physical therapist, or athletic trainer may ask you to add ankle weight to enhance your stretch.  RANGE OF MOTION - Knee Flexion, Active  Lie on your back with both knees straight. (If this causes back discomfort, bend your opposite knee, placing your foot flat on the floor.)  Slowly slide your heel back toward your buttocks until you feel a gentle stretch in the front of your knee or thigh.  Hold  for __________ seconds. Slowly slide your heel back to the starting position. Repeat __________ times. Complete this exercise __________ times per day.  STRETCH - Quadriceps, Prone   Lie on your stomach on a firm surface, such as a bed or padded floor.  Bend your right / left knee and grasp your ankle. If you are unable to reach your ankle or pant leg, use a belt around your foot to lengthen your reach.  Gently pull your heel toward your buttocks. Your knee should not slide out to the side. You should feel a stretch in the front of your thigh and/or knee.  Hold this position for __________ seconds. Repeat __________ times. Complete this stretch __________ times per day.  STRETCH - Hamstrings, Supine   Lie on your back. Loop a belt or towel over the ball of your right / left foot.  Straighten your right / left knee and slowly pull on the belt to raise your leg. Do not allow the right / left knee to bend. Keep your opposite leg flat on the floor.  Raise the leg until you feel a gentle stretch behind your right / left knee or thigh. Hold this position for __________ seconds. Repeat __________ times. Complete this stretch __________ times per day.  STRENGTHENING EXERCISES These exercises may help you when beginning to rehabilitate your injury. They may resolve your symptoms with or without further involvement from your physician, physical therapist, or athletic trainer. While completing these exercises, remember:   Muscles can gain both the endurance and the strength needed for everyday activities through  controlled exercises.  Complete these exercises as instructed by your physician, physical therapist, or athletic trainer. Progress the resistance and repetitions only as guided.  You may experience muscle soreness or fatigue, but the pain or discomfort you are trying to eliminate should never worsen during these exercises. If this pain does worsen, stop and make certain you are following the  directions exactly. If the pain is still present after adjustments, discontinue the exercise until you can discuss the trouble with your clinician. STRENGTH - Quadriceps, Isometrics  Lie on your back with your right / left leg extended and your opposite knee bent.  Gradually tense the muscles in the front of your right / left thigh. You should see either your knee cap slide up toward your hip or increased dimpling just above the knee. This motion will push the back of the knee down toward the floor/mat/bed on which you are lying.  Hold the muscle as tight as you can without increasing your pain for __________ seconds.  Relax the muscles slowly and completely in between each repetition. Repeat __________ times. Complete this exercise __________ times per day.  STRENGTH - Quadriceps, Short Arcs   Lie on your back. Place a __________ inch towel roll under your knee so that the knee slightly bends.  Raise only your lower leg by tightening the muscles in the front of your thigh. Do not allow your thigh to rise.  Hold this position for __________ seconds. Repeat __________ times. Complete this exercise __________ times per day.  OPTIONAL ANKLE WEIGHTS: Begin with ____________________, but DO NOT exceed ____________________. Increase in 1 pound/0.5 kilogram increments.  STRENGTH - Quadriceps, Straight Leg Raises  Quality counts! Watch for signs that the quadriceps muscle is working to insure you are strengthening the correct muscles and not "cheating" by substituting with healthier muscles.  Lay on your back with your right / left leg extended and your opposite knee bent.  Tense the muscles in the front of your right / left thigh. You should see either your knee cap slide up or increased dimpling just above the knee. Your thigh may even quiver.  Tighten these muscles even more and raise your leg 4 to 6 inches off the floor. Hold for __________ seconds.  Keeping these muscles tense, lower your  leg.  Relax the muscles slowly and completely in between each repetition. Repeat __________ times. Complete this exercise __________ times per day.  STRENGTH - Hamstring, Curls  Lay on your stomach with your legs extended. (If you lay on a bed, your feet may hang over the edge.)  Tighten the muscles in the back of your thigh to bend your right / left knee up to 90 degrees. Keep your hips flat on the bed/floor.  Hold this position for __________ seconds.  Slowly lower your leg back to the starting position. Repeat __________ times. Complete this exercise __________ times per day.  OPTIONAL ANKLE WEIGHTS: Begin with ____________________, but DO NOT exceed ____________________. Increase in 1 pound/0.5 kilogram increments.  STRENGTH - Quadriceps, Squats  Stand in a door frame so that your feet and knees are in line with the frame.  Use your hands for balance, not support, on the frame.  Slowly lower your weight, bending at the hips and knees. Keep your lower legs upright so that they are parallel with the door frame. Squat only within the range that does not increase your knee pain. Never let your hips drop below your knees.  Slowly return upright, pushing with  your legs, not pulling with your hands. Repeat __________ times. Complete this exercise __________ times per day.  STRENGTH - Quadriceps, Wall Slides  Follow guidelines for form closely. Increased knee pain often results from poorly placed feet or knees.  Lean against a smooth wall or door and walk your feet out 18-24 inches. Place your feet hip-width apart.  Slowly slide down the wall or door until your knees bend __________ degrees.* Keep your knees over your heels, not your toes, and in line with your hips, not falling to either side.  Hold for __________ seconds. Stand up to rest for __________ seconds in between each repetition. Repeat __________ times. Complete this exercise __________ times per day. * Your physician,  physical therapist, or athletic trainer will alter this angle based on your symptoms and progress.   This information is not intended to replace advice given to you by your health care provider. Make sure you discuss any questions you have with your health care provider.   Document Released: 11/30/2004 Document Revised: 02/06/2014 Document Reviewed: 04/30/2008 Elsevier Interactive Patient Education [employer].

## 2015-04-08 ENCOUNTER — Ambulatory Visit: Payer: BC Managed Care – PPO | Admitting: Podiatry

## 2015-04-14 ENCOUNTER — Encounter: Payer: Self-pay | Admitting: Podiatry

## 2015-04-14 ENCOUNTER — Ambulatory Visit (INDEPENDENT_AMBULATORY_CARE_PROVIDER_SITE_OTHER): Payer: BC Managed Care – PPO

## 2015-04-14 ENCOUNTER — Ambulatory Visit (INDEPENDENT_AMBULATORY_CARE_PROVIDER_SITE_OTHER): Payer: BC Managed Care – PPO | Admitting: Podiatry

## 2015-04-14 ENCOUNTER — Telehealth: Payer: Self-pay | Admitting: *Deleted

## 2015-04-14 VITALS — BP 110/71 | HR 85 | Resp 16

## 2015-04-14 DIAGNOSIS — Z9889 Other specified postprocedural states: Secondary | ICD-10-CM

## 2015-04-14 DIAGNOSIS — M2041 Other hammer toe(s) (acquired), right foot: Secondary | ICD-10-CM

## 2015-04-14 DIAGNOSIS — M205X1 Other deformities of toe(s) (acquired), right foot: Secondary | ICD-10-CM

## 2015-04-14 NOTE — Telephone Encounter (Signed)
Pt was leaving after her office visit and states 2 medications needed to be removed from her list.  I went over the medication and removed Advair and nasonex as pt requested.

## 2015-04-15 NOTE — Progress Notes (Signed)
Subjective:     Patient ID: [name], female   DOB: [birth year], 65 y.o.   MRN: [id number]  HPI patient states I'm doing well with swelling that's going down with occasional discomfort around the big toe joint if I do too much but I am wearing regular shoes at this time   Review of Systems     Objective:   Physical Exam Neurovascular status intact muscle strength adequate with continued reduction of edema in the right forefoot with hallux in rectus position and good range of motion with approximately 2530 dorsiflexion 20 plantar flexion with no crepitus and mild elevation of the second digit right but it's in good alignment and healing well with no movement at the interphalangeal joint    Assessment:     Continuing to prove post forefoot surgery right     Plan:     Discussed correction the fact there is some healing still to go interphalangeal joint of the right second toe and also that there is some lifting of the toe. The hallux and first metatarsal healing well but there is some irregularities around the first metatarsal but clinically the joint is functioning excellent. 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

## 2015-05-22 ENCOUNTER — Telehealth: Payer: Self-pay

## 2015-05-22 NOTE — Telephone Encounter (Signed)
Ms. Elem called in to the providers line because she was concerned about her fever and her cough and feeling lot of head pressure. She wanted to speak to a provider but I told her that we were not able to have a provider speak with her that she is more than welcome to come in to see [place] today. She had agreed to come in but wouldn't come in until her oxy she took for her fever wore off because she was afraid to drive after taking it.

## 2015-05-23 ENCOUNTER — Ambulatory Visit (INDEPENDENT_AMBULATORY_CARE_PROVIDER_SITE_OTHER): Payer: BC Managed Care – PPO | Admitting: Physician Assistant

## 2015-05-23 ENCOUNTER — Ambulatory Visit (INDEPENDENT_AMBULATORY_CARE_PROVIDER_SITE_OTHER): Payer: BC Managed Care – PPO

## 2015-05-23 VITALS — BP 128/86 | HR 86 | Temp 98.8°F | Resp 16 | Ht 62.0 in | Wt 181.0 lb

## 2015-05-23 DIAGNOSIS — R05 Cough: Secondary | ICD-10-CM

## 2015-05-23 DIAGNOSIS — J329 Chronic sinusitis, unspecified: Secondary | ICD-10-CM

## 2015-05-23 DIAGNOSIS — J0191 Acute recurrent sinusitis, unspecified: Secondary | ICD-10-CM | POA: Diagnosis not present

## 2015-05-23 DIAGNOSIS — R059 Cough, unspecified: Secondary | ICD-10-CM

## 2015-05-23 MED ORDER — MOMETASONE FUROATE 50 MCG/ACT NA SUSP: 2.0000 | Freq: Every day | NASAL | Status: DC | PRN

## 2015-05-23 MED ORDER — CLARITHROMYCIN ER 500 MG PO TB24: 1000.0000 mg | ORAL_TABLET | Freq: Every day | ORAL | Status: DC

## 2015-05-23 MED ORDER — HYDROCOD POLST-CPM POLST ER 10-8 MG/5ML PO SUER: 5.0000 mL | Freq: Two times a day (BID) | ORAL | Status: DC | PRN

## 2015-05-23 NOTE — Patient Instructions (Addendum)
Get plenty of rest and drink at least 64 ounces of water daily. Please HOLD the simvastatin while taking the antibiotic (clarithromycin).    IF you received an x-ray today, you will receive an invoice from [facility]. Please contact [facility] at [phone] with questions or concerns regarding your invoice.   IF you received labwork today, you will receive an invoice from [employer]. Please contact [facility] at [phone] with questions or concerns regarding your invoice.   Our billing staff will not be able to assist you with questions regarding bills from these companies.  You will be contacted with the lab results as soon as they are available. The fastest way to get your results is to activate your My Chart account. Instructions are located on the last page of this paperwork. If you have not heard from [place] regarding the results in 2 weeks, please contact this office.

## 2015-05-23 NOTE — Progress Notes (Signed)
Subjective:    Patient ID: [name], female    DOB: 02-Aug-1950, 65 y.o.   MRN: [id number]  Chief Complaint  Patient presents with  . Sinusitis    x 1wk  . ear itching    both  . Cough    x 1wk    HPI Patient presents with history of asthma, chronic sinusitis, and seasonal allergies  for 1 week history of productive cough and sinus congestion.  Patient recently returned from [place] approx. 1 week ago. At that time she started to develop a productive cough with yellow-brown sputum. Along with sinus pressure and green-yellow rhinorrhea. Patient also complains of an occipital headache that feel like her head is pulsating. Subjective fevers at home of 100.5 tmax. She endorses ear fullness, sore throat, and sob.  Patient has been using her albuterol inhaler, Atrovent nasal spray, tessalon, percocet with little relief. The cough is keeping her up at night and she has not been able to get a good night sleep. Her coughing spells also make her gag to the point of almost vomiting.   Current Outpatient Prescriptions on File Prior to Visit  Medication Sig Dispense Refill  . acetaminophen (TYLENOL) 650 MG CR tablet Take 1,300 mg by mouth every 8 (eight) hours as needed for pain.    Marland Kitchen albuterol (PROVENTIL HFA;VENTOLIN HFA) 108 (90 BASE) MCG/ACT inhaler Inhale 2 puffs into the lungs every 6 (six) hours as needed for wheezing or shortness of breath. 1 Inhaler 2  . amphetamine-dextroamphetamine (ADDERALL) 20 MG tablet Take 1 tablet 2-3 times a day for ADD. 270 tablet 0  . benzonatate (TESSALON) 100 MG capsule 1-2 tablets 3 times a day as needed for cough 60 capsule 0  . cyclobenzaprine (FLEXERIL) 10 MG tablet Take 1 tablet (10 mg total) by mouth 2 (two) times daily as needed for muscle spasms. 30 tablet 0  . DULoxetine (CYMBALTA) 60 MG capsule Take 1 capsule (60 mg total) by mouth daily. 90 capsule 3  . loratadine (CLARITIN) 10 MG tablet Take 1 tablet (10 mg total) by mouth daily. 90 tablet 3  .  Multiple Vitamin (MULTIVITAMIN) tablet Take 1 tablet by mouth daily.      Marland Kitchen OVER THE COUNTER MEDICATION Take 1 capsule by mouth 3 (three) times daily. OTC Magnesium Complex 2 caps daily with food    . OVER THE COUNTER MEDICATION New Chapter Bone Strength taking 2-3 tabs daily with food    . simvastatin (ZOCOR) 40 MG tablet Take 1 tablet (40 mg total) by mouth daily. 90 tablet 3  . Fluticasone-Salmeterol (ADVAIR) 250-50 MCG/DOSE AEPB Inhale 1 puff into the lungs 2 (two) times daily as needed. Reported on 05/23/2015    . ipratropium (ATROVENT) 0.03 % nasal spray Place 2 sprays into the nose 4 (four) times daily. Use as needed for runny nose and congestion (Patient not taking: Reported on 05/23/2015) 30 mL 6  . mometasone (NASONEX) 50 MCG/ACT nasal spray Place 2 sprays into the nose daily as needed. (Patient not taking: Reported on 04/14/2015) 17 g 5   No current facility-administered medications on file prior to visit.   Allergies  Allergen Reactions  . Aspirin     Other reaction(s): GI Intolerance History of ulcers  . Bee Venom     Any insects that bite or stings  . Nsaids Other (See Comments)    History of ulcers      Review of Systems  Constitutional: Positive for fever and fatigue. Negative for chills.  HENT: Positive for congestion, ear pain, postnasal drip, rhinorrhea, sinus pressure, sneezing and sore throat.   Eyes: Negative.   Respiratory: Positive for cough, shortness of breath and wheezing.   Cardiovascular: Negative for chest pain.  Gastrointestinal: Negative for nausea, vomiting, abdominal pain and diarrhea.  Neurological: Positive for headaches. Negative for dizziness, syncope, light-headedness and numbness.       Objective:   Physical Exam  Constitutional: She is oriented to person, place, and time. She appears well-developed and well-nourished. No distress.  HENT:  Head: Normocephalic and atraumatic.  Right Ear: Tympanic membrane, external ear and ear canal normal.    Left Ear: Tympanic membrane, external ear and ear canal normal.  Nose: Mucosal edema and rhinorrhea present. Right sinus exhibits no maxillary sinus tenderness and no frontal sinus tenderness. Left sinus exhibits no maxillary sinus tenderness and no frontal sinus tenderness.  Mouth/Throat: Mucous membranes are normal. Posterior oropharyngeal erythema present. No oropharyngeal exudate or posterior oropharyngeal edema.  Tenderness to the occipital region bilaterally while palpating frontal and nasal sinuses.  Eyes: Conjunctivae and EOM are normal. Pupils are equal, round, and reactive to light.  Cardiovascular: Normal rate, regular rhythm, normal heart sounds and intact distal pulses.   Pulmonary/Chest: Effort normal.  Course breath sounds throughout, intermittent scattered wheezes that clear with cough  Neurological: She is alert and oriented to person, place, and time.  Skin: Skin is warm and dry.  Psychiatric: She has a normal mood and affect. Her behavior is normal. Judgment and thought content normal.          Assessment & Plan:  1. Cough Likely due to sinusitis leading to a bacterial bronchitis. Will treat with abx to cover for sinusitis and bronchitis. Use tussionex at night to help with cough and sleep. - DG Chest 2 View; Future - chlorpheniramine-HYDROcodone (TUSSIONEX PENNKINETIC ER) 10-8 MG/5ML SUER; Take 5 mLs by mouth every 12 (twelve) hours as needed for cough.  Dispense: 100 mL; Refill: 0  2. Acute recurrent sinusitis, unspecified location Treat with abx. - clarithromycin (BIAXIN XL) 500 MG 24 hr tablet; Take 2 tablets (1,000 mg total) by mouth daily.  Dispense: 20 tablet; Refill: 0  3. Chronic sinusitis, unspecified location - mometasone (NASONEX) 50 MCG/ACT nasal spray; Place 2 sprays into the nose daily as needed.  Dispense: 17 g; Refill: 5  Melina Schools PA-S 05/23/2015

## 2015-05-23 NOTE — Progress Notes (Signed)
Patient ID: [name], female    DOB: [birth year], 65 y.o.   MRN: [id number]  PCP: Reginia Forts, MD  Subjective:   Chief Complaint  Patient presents with  . Sinusitis    x 1wk  . ear itching    both  . Cough    x 1wk    HPI Patient presents with history of asthma, chronic sinusitis, and seasonal allergies for 1 week history of productive cough and sinus congestion.  Patient recently returned from [place] approx. 1 week ago. At that time she started to develop a productive cough with yellow-brown sputum. Along with sinus pressure and green-yellow rhinorrhea. Patient also complains of an occipital headache that feel like her head is pulsating. Subjective fevers at home of 100.5 tmax. She endorses ear fullness, sore throat, and sob.  Patient has been using her albuterol inhaler, Atrovent nasal spray, tessalon, percocet with little relief. The cough is keeping her up at night and she has not been able to get a good night sleep. Her coughing spells also make her gag to the point of almost vomiting.    Review of Systems Constitutional: Positive for fever and fatigue. Negative for chills.  HENT: Positive for congestion, ear pain, postnasal drip, rhinorrhea, sinus pressure, sneezing and sore throat.  Eyes: Negative.  Respiratory: Positive for cough, shortness of breath and wheezing.  Cardiovascular: Negative for chest pain.  Gastrointestinal: Negative for nausea, vomiting, abdominal pain and diarrhea.  Neurological: Positive for headaches. Negative for dizziness, syncope, light-headedness and numbness.       Patient Active Problem List   Diagnosis Date Noted  . Sinusitis, chronic   . Asthma 01/08/2012  . Dyslipidemia   . ADD (attention deficit disorder)   . Depression   . Osteopenia   . Incisional hernia   . Stress incontinence, female      Prior to Admission medications   Medication Sig Start Date End Date Taking? Authorizing Provider  acetaminophen (TYLENOL) 650  MG CR tablet Take 1,300 mg by mouth every 8 (eight) hours as needed for pain.   Yes Historical Provider, MD  albuterol (PROVENTIL HFA;VENTOLIN HFA) 108 (90 BASE) MCG/ACT inhaler Inhale 2 puffs into the lungs every 6 (six) hours as needed for wheezing or shortness of breath. 12/17/14  Yes Darreld Mclean, MD  amphetamine-dextroamphetamine (ADDERALL) 20 MG tablet Take 1 tablet 2-3 times a day for ADD. 03/31/15  Yes [name], MD  benzonatate (TESSALON) 100 MG capsule 1-2 tablets 3 times a day as needed for cough 01/21/15  Yes [name], MD  cyclobenzaprine (FLEXERIL) 10 MG tablet Take 1 tablet (10 mg total) by mouth 2 (two) times daily as needed for muscle spasms. 01/22/15  Yes [name], MD  DULoxetine (CYMBALTA) 60 MG capsule Take 1 capsule (60 mg total) by mouth daily. 03/31/15  Yes [name], MD  loratadine (CLARITIN) 10 MG tablet Take 1 tablet (10 mg total) by mouth daily. 05/31/14  Yes Shawnee Knapp, MD  Multiple Vitamin (MULTIVITAMIN) tablet Take 1 tablet by mouth daily.     Yes Historical Provider, MD  OVER THE COUNTER MEDICATION Take 1 capsule by mouth 3 (three) times daily. OTC Magnesium Complex 2 caps daily with food   Yes Historical Provider, MD  OVER THE COUNTER MEDICATION New Chapter Bone Strength taking 2-3 tabs daily with food   Yes Historical Provider, MD  simvastatin (ZOCOR) 40 MG tablet Take 1 tablet (40 mg total) by mouth daily. 03/31/15  Yes  [name], MD  Fluticasone-Salmeterol (ADVAIR) 250-50 MCG/DOSE AEPB Inhale 1 puff into the lungs 2 (two) times daily as needed. Reported on 05/23/2015 01/08/12   Rowe Clack, MD  ipratropium (ATROVENT) 0.03 % nasal spray Place 2 sprays into the nose 4 (four) times daily. Use as needed for runny nose and congestion Patient not taking: Reported on 05/23/2015 12/17/14   Gay Filler Copland, MD  mometasone (NASONEX) 50 MCG/ACT nasal spray Place 2 sprays into the nose daily as needed. Patient not taking: Reported on 04/14/2015 05/31/14    Shawnee Knapp, MD     Allergies  Allergen Reactions  . Aspirin     Other reaction(s): GI Intolerance History of ulcers  . Bee Venom     Any insects that bite or stings  . Nsaids Other (See Comments)    History of ulcers       Objective:  Physical Exam  Constitutional: She is oriented to person, place, and time. She appears well-developed and well-nourished. She is active and cooperative. No distress.  BP 128/86 mmHg  Pulse 86  Temp(Src) 98.8 F (37.1 C) (Oral)  Resp 16  Ht 5\' 2"  (1.575 m)  Wt 181 lb (82.101 kg)  BMI 33.10 kg/m2  SpO2 96%  LMP 08/30/2008  HENT:  Head: Normocephalic and atraumatic.  Right Ear: Hearing, tympanic membrane, external ear and ear canal normal.  Left Ear: Hearing, tympanic membrane, external ear and ear canal normal.  Nose: Mucosal edema present. No epistaxis. Right sinus exhibits no maxillary sinus tenderness and no frontal sinus tenderness. Left sinus exhibits no maxillary sinus tenderness and no frontal sinus tenderness.  Mouth/Throat: Uvula is midline, oropharynx is clear and moist and mucous membranes are normal. No oral lesions. No uvula swelling.  Palpation of the sinuses increases pain in the occiput  Eyes: Conjunctivae, EOM and lids are normal. No scleral icterus.  Neck: Normal range of motion, full passive range of motion without pain and phonation normal. Neck supple. No thyromegaly present.  Cardiovascular: Normal rate, regular rhythm and normal heart sounds.   Pulses:      Radial pulses are 2+ on the right side, and 2+ on the left side.  Pulmonary/Chest: Effort normal. She has no decreased breath sounds (diffusely coarse). She has wheezes (scattered, clear temporarily after cough). She has no rhonchi. She has no rales.  Lymphadenopathy:       Head (right side): No tonsillar, no preauricular, no posterior auricular and no occipital adenopathy present.       Head (left side): No tonsillar, no preauricular, no posterior auricular and no  occipital adenopathy present.    She has no cervical adenopathy.       Right: No supraclavicular adenopathy present.       Left: No supraclavicular adenopathy present.  Neurological: She is alert and oriented to person, place, and time. She has normal strength. No sensory deficit.  Skin: Skin is warm, dry and intact. No rash noted. No cyanosis or erythema. Nails show no clubbing.  Psychiatric: She has a normal mood and affect. Her speech is normal and behavior is normal.    Dg Chest 2 View  05/23/2015  CLINICAL DATA:  Productive cough for 1 week EXAM: CHEST  2 VIEW COMPARISON:  07/13/2014 chest radiograph. FINDINGS: Smoothly marginated retrocardiac density in the lower midline chest likely represents a new small hiatal hernia. Stable normal heart size. Otherwise normal mediastinal contour. No pneumothorax. No pleural effusion. Lungs appear clear, with no acute consolidative airspace  disease and no pulmonary edema. IMPRESSION: 1. No active cardiopulmonary disease. 2. Probable new small hiatal hernia. Electronically Signed   By: Ilona Sorrel M.D.   On: 05/23/2015 17:15          Assessment & Plan:   1. Cough Likely due to drainage from sinuses. Cannot exclude concomitant bacterial bronchitis.  - DG Chest 2 View; Future - chlorpheniramine-HYDROcodone (TUSSIONEX PENNKINETIC ER) 10-8 MG/5ML SUER; Take 5 mLs by mouth every 12 (twelve) hours as needed for cough.  Dispense: 100 mL; Refill: 0  2. Acute recurrent sinusitis, unspecified location Supportive care.  Anticipatory guidance.  RTC if symptoms worsen/persist. - clarithromycin (BIAXIN XL) 500 MG 24 hr tablet; Take 2 tablets (1,000 mg total) by mouth daily.  Dispense: 20 tablet; Refill: 0  3. Chronic sinusitis, unspecified location Continue Atrovent NS. Resume steroid nasal spray. - mometasone (NASONEX) 50 MCG/ACT nasal spray; Place 2 sprays into the nose daily as needed.  Dispense: 17 g; Refill: 5   Fara Chute, PA-C Physician  Assistant-Certified [facility]

## 2015-05-24 ENCOUNTER — Telehealth: Payer: Self-pay

## 2015-05-24 NOTE — Telephone Encounter (Signed)
Pt has questions about medication she was given and also the coughing not sure she is ready to return to work [employer] development officer number [phone]

## 2015-05-24 NOTE — Telephone Encounter (Signed)
Patient is calling to see if Chelle can call her today. She states she'll really appreciate it. Patient wants to talk with Chelle about how her medication is causing wheezing and crackling in her voice. She doesn't know which of the medications is causing the side effects and she would like to know if she should stay home from work. Please advise! [phone]

## 2015-05-25 NOTE — Telephone Encounter (Signed)
Patient called back and was given the message from [facility].  Patient was not happy that her phone call was not returned in an reasonable time.  I explained to the patient some possible reasons why and she finally understood. Patient states she will try and come in tomorrow to see Dr. Tamala Julian if she is not feeling better.

## 2015-05-25 NOTE — Telephone Encounter (Signed)
None of the medications should be causing wheezing or crackling in her voice, but the drainage from her sinuses can cause that. She could try stopping the nasal spray and the Tussionex, staying on just the antibiotic, but I suspect that this is not a medication problem, rather the underlying illness.  I think it's very appropriate to stay home from work today, and tomorrow if needed. If she feels worse, or isn't ready to go back to work on Thursday, she should return for re-evaluation.

## 2015-06-21 ENCOUNTER — Encounter: Payer: Self-pay | Admitting: Podiatry

## 2015-06-21 ENCOUNTER — Ambulatory Visit (INDEPENDENT_AMBULATORY_CARE_PROVIDER_SITE_OTHER): Payer: BC Managed Care – PPO | Admitting: Podiatry

## 2015-06-21 ENCOUNTER — Ambulatory Visit (INDEPENDENT_AMBULATORY_CARE_PROVIDER_SITE_OTHER): Payer: BC Managed Care – PPO

## 2015-06-21 DIAGNOSIS — Z9889 Other specified postprocedural states: Secondary | ICD-10-CM

## 2015-06-21 DIAGNOSIS — M205X1 Other deformities of toe(s) (acquired), right foot: Secondary | ICD-10-CM | POA: Diagnosis not present

## 2015-06-21 DIAGNOSIS — M2041 Other hammer toe(s) (acquired), right foot: Secondary | ICD-10-CM | POA: Diagnosis not present

## 2015-06-23 NOTE — Progress Notes (Signed)
Subjective:     Patient ID: [name], female   DOB: 09/13/1950, 65 y.o.   MRN: [id number]  HPI patient presents stating that she's doing well with occasional discomfort but overall is walking with a better heel toe gait   Review of Systems     Objective:   Physical Exam Neurovascular status intact muscle strength adequate with significant diminishment of discomfort in the first MPJ right with inflammation still noted but quite a bit better    Assessment:     Improvement of capsulitis first MPJ right after having had surgery with good alignment noted and second digit slightly elevated but overall and good functional position    Plan:     Reviewed final x-rays and encouraged patient range of motion and swelling should gradually reduce over the next several months and reappoint 8 weeks or earlier if needed  X-rays indicate that structure is healing there's fibro-healing of the second digit interphalangeal joint right but it is non-symptomatically clinically and should heal uneventfully

## 2015-08-25 ENCOUNTER — Ambulatory Visit (INDEPENDENT_AMBULATORY_CARE_PROVIDER_SITE_OTHER): Payer: BC Managed Care – PPO | Admitting: Physician Assistant

## 2015-08-25 VITALS — BP 120/72 | HR 94 | Temp 98.7°F | Resp 18 | Ht 61.5 in | Wt 183.0 lb

## 2015-08-25 DIAGNOSIS — M25512 Pain in left shoulder: Secondary | ICD-10-CM | POA: Diagnosis not present

## 2015-08-25 DIAGNOSIS — H6193 Disorder of external ear, unspecified, bilateral: Secondary | ICD-10-CM | POA: Diagnosis not present

## 2015-08-25 DIAGNOSIS — R059 Cough, unspecified: Secondary | ICD-10-CM

## 2015-08-25 DIAGNOSIS — M542 Cervicalgia: Secondary | ICD-10-CM

## 2015-08-25 DIAGNOSIS — R05 Cough: Secondary | ICD-10-CM

## 2015-08-25 MED ORDER — BENZONATATE 100 MG PO CAPS: ORAL_CAPSULE | ORAL | 0 refills | Status: DC

## 2015-08-25 MED ORDER — CYCLOBENZAPRINE HCL 10 MG PO TABS: 10.0000 mg | ORAL_TABLET | Freq: Two times a day (BID) | ORAL | 1 refills | Status: DC | PRN

## 2015-08-25 MED ORDER — MUPIROCIN 2 % EX OINT: 1.0000 "application " | TOPICAL_OINTMENT | Freq: Two times a day (BID) | CUTANEOUS | 1 refills | Status: DC

## 2015-08-25 NOTE — Progress Notes (Signed)
[facility] [address], [place] [address] [phone]  Date:  08/25/2015   Name:  [name]   DOB:  Jun 01, 1950   MRN:  [id number]  PCP:  Reginia Forts, MD   Chief Complaint  Patient presents with  . Ear Pain    x 2 weeks, itchy bump in both ears    History of Present Illness:  [name] is a 65 y.o. female patient who presents to [facility] for cc of ear pain.  Both ears are itching at the top of the right ear.   At the left side is itching ear pain.  She has been sweating a lot as she was travelling in a very hot area--just prior to her symptoms starting, 2 weeks ago.  She states that she is rubbing the areas with her fingers. No drainage.        Patient Active Problem List   Diagnosis Date Noted  . Sinusitis, chronic   . Asthma 01/08/2012  . Dyslipidemia   . ADD (attention deficit disorder)   . Depression   . Osteopenia   . Incisional hernia   . Stress incontinence, female     Past Medical History:  Diagnosis Date  . ADD (attention deficit disorder)   . Arthritis    "bunion on  right" (08/11/2014)  . Chronic bronchitis ([place])    "plenty; but not q yr" (08/11/2014)  . Depression   . Dyslipidemia   . Environmental allergies   . GERD (gastroesophageal reflux disease)   . Hearing difficulty of left ear    "grew up w/deaf parent; find myself lip reading more recently" (08/11/2014)  . Hepatitis    "exposed in college; rec'd gamma globulin; 6 months later S/S (weakness, lethargy, fevers); ; dx'd infectious hepatitis"  . History of herpes genitalis    "stress induced reoccurance that shows up on my left middle finger" (08/11/2014)  . History of hiatal hernia   . History of stomach ulcers   . Hypercholesterolemia   . Hyperthyroidism 1994   "postpartum only; resolved itself"  . Incisional hernia   . Leg cramping   . Osteopenia   . Seasonal asthma   . Stress incontinence, female   . Ulcer     Past Surgical History:  Procedure Laterality  Date  . CESAREAN SECTION  1994  . CHALAZION EXCISION Left ~ 2013  . DILATION AND CURETTAGE OF UTERUS  X 2  . EYE SURGERY    . HERNIA REPAIR  08/11/2014  . INCISIONAL HERNIA REPAIR N/A 08/11/2014   Procedure: LAPAROSCOPIC INCISIONAL HERNIA REPAIR;  Surgeon: Coralie Keens, MD;  Location: [facility];  Service: General;  Laterality: N/A;  . INCONTINENCE SURGERY  2005  . INGUINAL HERNIA REPAIR Left 08/11/2014  . INGUINAL HERNIA REPAIR Left 08/11/2014   Procedure: LEFT INGUINAL HERNIA REPAIR;  Surgeon: Coralie Keens, MD;  Location: [facility];  Service: General;  Laterality: Left;  . INSERTION OF MESH Left 08/11/2014   Procedure: INSERTION OF MESH TO LEFT GROIN AND ABDOMEN;  Surgeon: Coralie Keens, MD;  Location: [place];  Service: General;  Laterality: Left;  . LAPAROSCOPIC INCISIONAL / UMBILICAL / VENTRAL HERNIA REPAIR  08/11/2014   [facility]  . NASAL POLYP EXCISION  2014  . REPAIR OF PERFORATED ULCER  1985  . TONSILLECTOMY      Social History  Substance Use Topics  . Smoking status: Never Smoker  . Smokeless tobacco: Never Used  . Alcohol use 1.2 oz/week  1 Glasses of wine, 1 Shots of liquor per week     Comment: 08/11/2014 "0-2 glasses of wine or a mixed drink q wk"    Family History  Problem Relation Age of Onset  . Arthritis Mother   . Heart disease Mother     CABG @ 29  . Diabetes Mother   . Osteoporosis Mother   . Hyperlipidemia Mother   . Arthritis Father   . Lymphoma Father 51    chemo  . Cancer Father     lymphoma  . Lymphoma Paternal Grandmother   . Diabetes Maternal Grandmother     Allergies  Allergen Reactions  . Aspirin     Other reaction(s): GI Intolerance History of ulcers  . Bee Venom     Any insects that bite or stings  . Nsaids Other (See Comments)    History of ulcers    Medication list has been reviewed and updated.  Current Outpatient Prescriptions on File Prior to Visit  Medication Sig Dispense Refill  . acetaminophen (TYLENOL) 650 MG CR tablet Take  1,300 mg by mouth every 8 (eight) hours as needed for pain.    Marland Kitchen albuterol (PROVENTIL HFA;VENTOLIN HFA) 108 (90 BASE) MCG/ACT inhaler Inhale 2 puffs into the lungs every 6 (six) hours as needed for wheezing or shortness of breath. 1 Inhaler 2  . amphetamine-dextroamphetamine (ADDERALL) 20 MG tablet Take 1 tablet 2-3 times a day for ADD. 270 tablet 0  . cyclobenzaprine (FLEXERIL) 10 MG tablet Take 1 tablet (10 mg total) by mouth 2 (two) times daily as needed for muscle spasms. 30 tablet 0  . DULoxetine (CYMBALTA) 60 MG capsule Take 1 capsule (60 mg total) by mouth daily. 90 capsule 3  . ipratropium (ATROVENT) 0.03 % nasal spray Place 2 sprays into the nose 4 (four) times daily. Use as needed for runny nose and congestion 30 mL 6  . loratadine (CLARITIN) 10 MG tablet Take 1 tablet (10 mg total) by mouth daily. 90 tablet 3  . mometasone (NASONEX) 50 MCG/ACT nasal spray Place 2 sprays into the nose daily as needed. 17 g 5  . OVER THE COUNTER MEDICATION Take 1 capsule by mouth 3 (three) times daily. OTC Magnesium Complex 2 caps daily with food    . OVER THE COUNTER MEDICATION New Chapter Bone Strength taking 2-3 tabs daily with food    . simvastatin (ZOCOR) 40 MG tablet Take 1 tablet (40 mg total) by mouth daily. 90 tablet 3  . benzonatate (TESSALON) 100 MG capsule 1-2 tablets 3 times a day as needed for cough (Patient not taking: Reported on 08/25/2015) 60 capsule 0  . cholecalciferol (VITAMIN D) 1000 units tablet Take 1,000 Units by mouth daily.     No current facility-administered medications on file prior to visit.     ROS   Physical Examination: BP 134/86 (BP Location: Right Arm, Patient Position: Sitting, Cuff Size: Normal)   Pulse 94   Temp 98.7 F (37.1 C)   Resp 18   Ht 5' 1.5" (1.562 m)   Wt 183 lb (83 kg)   LMP 08/30/2008   SpO2 98%   BMI 34.02 kg/m  Ideal Body Weight: Weight in (lb) to have BMI = 25: 134.2  Physical Exam  Constitutional: She is oriented to person, place, and  time. She appears well-developed and well-nourished. No distress.  HENT:  Head: Normocephalic and atraumatic.  Right Ear: Tympanic membrane and ear canal normal.  Left Ear: Tympanic membrane and ear canal  normal.  Ears:  Eyes: Conjunctivae and EOM are normal. Pupils are equal, round, and reactive to light.  Cardiovascular: Normal rate.   Pulmonary/Chest: Effort normal. No respiratory distress.  Neurological: She is alert and oriented to person, place, and time.  Skin: She is not diaphoretic.  Psychiatric: She has a normal mood and affect. Her behavior is normal.     Assessment and Plan: [name] is a 65 y.o. female who is here today for cc of rash at ear. --given mupirocin at this time.  Advised to use a qtip and not touch a great deal.  Advised to return to clinic if her symptoms do not improve within the week.  Patient voiced understanding.   Earlobe lesion, bilateral - Plan: mupirocin ointment (BACTROBAN) 2 %  Neck pain on left side - Plan: cyclobenzaprine (FLEXERIL) 10 MG tablet  Cough - Plan: DISCONTINUED: benzonatate (TESSALON) 100 MG capsule  Neck pain  Pain in joint of left shoulder - Plan: cyclobenzaprine (FLEXERIL) 10 MG tablet, DISCONTINUED: benzonatate (TESSALON) 100 MG capsule  Ivar Drape, PA-C [facility] 08/25/2015 4:36 PM

## 2015-08-25 NOTE — Patient Instructions (Addendum)
  Apply twice daily.  Clean with soap and water.      IF you received an x-ray today, you will receive an invoice from [facility]. Please contact [facility] at [phone] with questions or concerns regarding your invoice.   IF you received labwork today, you will receive an invoice from [employer]. Please contact [facility] at [phone] with questions or concerns regarding your invoice.   Our billing staff will not be able to assist you with questions regarding bills from these companies.  You will be contacted with the lab results as soon as they are available. The fastest way to get your results is to activate your My Chart account. Instructions are located on the last page of this paperwork. If you have not heard from [place] regarding the results in 2 weeks, please contact this office.

## 2015-08-30 ENCOUNTER — Ambulatory Visit (INDEPENDENT_AMBULATORY_CARE_PROVIDER_SITE_OTHER): Payer: BC Managed Care – PPO | Admitting: Podiatry

## 2015-08-30 ENCOUNTER — Ambulatory Visit (INDEPENDENT_AMBULATORY_CARE_PROVIDER_SITE_OTHER): Payer: BC Managed Care – PPO

## 2015-08-30 ENCOUNTER — Encounter: Payer: Self-pay | Admitting: Podiatry

## 2015-08-30 DIAGNOSIS — S99921A Unspecified injury of right foot, initial encounter: Secondary | ICD-10-CM

## 2015-08-30 DIAGNOSIS — M722 Plantar fascial fibromatosis: Secondary | ICD-10-CM

## 2015-08-31 NOTE — Progress Notes (Signed)
Subjective:     Patient ID: [name], female   DOB: 1950/10/15, 65 y.o.   MRN: [id number]  HPI patient presents stating I'm traumatized on my right foot secondary to dropping a marble [place] on it and also my left forefoot has been bothering me and I wanted to get that checked   Review of Systems     Objective:   Physical Exam Neurovascular status intact muscle strength is adequate range of motion within normal limits. Patient's found to have edema on the dorsum of the right foot with irritation of tissue and is noted to have swelling associated with this. On the left there is moderate forefoot edema with no drainage noted on that foot either    Assessment:     Traumatized right foot with no indications of infection and moderate inflammation dorsum left foot    Plan:     H&P and x-rays reviewed and discussed condition at great length. At this point I reviewed her x-rays indicating no indications of fracture appears to be a contusion and I advised on elevation and compression and it should resolve and if not better in the next few weeks and one see the patient back

## 2015-09-06 ENCOUNTER — Ambulatory Visit: Payer: BC Managed Care – PPO | Admitting: Podiatry

## 2015-09-22 ENCOUNTER — Ambulatory Visit (INDEPENDENT_AMBULATORY_CARE_PROVIDER_SITE_OTHER): Payer: BC Managed Care – PPO

## 2015-09-22 ENCOUNTER — Ambulatory Visit (INDEPENDENT_AMBULATORY_CARE_PROVIDER_SITE_OTHER): Payer: BC Managed Care – PPO | Admitting: Podiatry

## 2015-09-22 DIAGNOSIS — M2041 Other hammer toe(s) (acquired), right foot: Secondary | ICD-10-CM

## 2015-09-22 DIAGNOSIS — M205X1 Other deformities of toe(s) (acquired), right foot: Secondary | ICD-10-CM

## 2015-09-22 DIAGNOSIS — M779 Enthesopathy, unspecified: Secondary | ICD-10-CM | POA: Diagnosis not present

## 2015-09-22 DIAGNOSIS — Z9889 Other specified postprocedural states: Secondary | ICD-10-CM

## 2015-09-22 MED ORDER — TRIAMCINOLONE ACETONIDE 10 MG/ML IJ SUSP
10.0000 mg | Freq: Once | INTRAMUSCULAR | Status: AC
  Administered 2015-09-22: 10 mg

## 2015-09-23 ENCOUNTER — Ambulatory Visit: Payer: BC Managed Care – PPO | Admitting: Podiatry

## 2015-09-23 NOTE — Progress Notes (Signed)
Subjective:     Patient ID: [name], female   DOB: 1950-08-15, 65 y.o.   MRN: [id number]  HPI patient presents to right foot stating that it doesn't really hurt around the big toe joint but the top of the foot hurts where she dropped a brick on   Review of Systems     Objective:   Physical Exam Neurovascular status intact with well-healed surgical site right first metatarsal with mild elevation second digit and moderate edema in the top of the right foot around the metatarsal shafts. 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 Reappoint as needed  X-ray indicated there is quite a bit of change around the first metatarsal head right with possible indications of avascular necrosis versus arthritic issues of the big toe joint with good healing of the osteotomy and pins intact

## 2015-09-24 NOTE — Progress Notes (Signed)
DOS 12/08/2014 [place] bunionectomy (cutting and removing bone) w/removal bone spur 1st met right, fusion w/pin 2nd toe right foot

## 2015-11-04 ENCOUNTER — Ambulatory Visit: Payer: BC Managed Care – PPO | Admitting: Podiatry

## 2015-11-11 ENCOUNTER — Encounter: Payer: Self-pay | Admitting: Podiatry

## 2015-11-11 ENCOUNTER — Ambulatory Visit (INDEPENDENT_AMBULATORY_CARE_PROVIDER_SITE_OTHER): Payer: BC Managed Care – PPO | Admitting: Podiatry

## 2015-11-11 DIAGNOSIS — M779 Enthesopathy, unspecified: Secondary | ICD-10-CM

## 2015-11-11 DIAGNOSIS — M205X1 Other deformities of toe(s) (acquired), right foot: Secondary | ICD-10-CM | POA: Diagnosis not present

## 2015-11-11 NOTE — Progress Notes (Signed)
Subjective:     Patient ID: [name], female   DOB: Dec 15, 1950, 65 y.o.   MRN: [id number]  HPI patient states she's not having much pain anymore and she's able to wear her regular shoes but she does have some discoloration on top of the foot   Review of Systems     Objective:   Physical Exam Neurovascular status intact with patient healing well with mild elevation second digit right were she had had some trauma and good range of motion first MPJ with no crepitus    Assessment:     Doing well post foot surgery right with mild forefoot edema still noted and mild discoloration which should resolve    Plan:     Reviewed condition and discussed it should gradually get better. I advised on continued compression as needed elevation and will be seen back on an as-needed basis

## 2015-11-23 ENCOUNTER — Telehealth: Payer: Self-pay

## 2015-11-23 NOTE — Telephone Encounter (Signed)
PATIENT WOULD LIKE THIS MESSAGE TO GO TO DR. Tamala Julian: SHE WOULD LIKE TO HAVE HER ANNUAL PHYSICAL DONE ON Tuesday 11/30/2015 AT 5:00 pm. SHE IS A TEACHER'S ASSISTANT IN [place] AND THIS WOULD HELP HER NOT TO HAVE TO LEAVE WORK EARLY. I TOLD HER WE WOULD HAVE TO GET DR. SMITH'S APPROVAL BECAUSE THIS WAS A (ON-HOLD - PROVIDER REQUEST) SLOT. BEST PHONE [phone] (CELL) [place]

## 2015-11-25 NOTE — Telephone Encounter (Signed)
Please f/u  Ty

## 2015-11-26 NOTE — Telephone Encounter (Signed)
Stanton Kidney can you please call [name] back and see if she would like an appointment on another day with Dr Tamala Julian as she is not available on the 31st due Halloween.   Thank you for your time!

## 2015-11-26 NOTE — Telephone Encounter (Signed)
Please call patient back --- I do not have an available appointment on November 30, 2015.  I am happy to perform a CPE at 5:00 or 5:30pm for her on Wednesday, 12/01/15.

## 2015-11-29 ENCOUNTER — Ambulatory Visit (INDEPENDENT_AMBULATORY_CARE_PROVIDER_SITE_OTHER): Payer: BC Managed Care – PPO | Admitting: Family Medicine

## 2015-11-29 VITALS — BP 114/78 | HR 71 | Temp 97.9°F | Resp 18 | Ht 61.5 in | Wt 180.0 lb

## 2015-11-29 DIAGNOSIS — M25612 Stiffness of left shoulder, not elsewhere classified: Secondary | ICD-10-CM | POA: Diagnosis not present

## 2015-11-29 DIAGNOSIS — Z131 Encounter for screening for diabetes mellitus: Secondary | ICD-10-CM | POA: Diagnosis not present

## 2015-11-29 DIAGNOSIS — F901 Attention-deficit hyperactivity disorder, predominantly hyperactive type: Secondary | ICD-10-CM

## 2015-11-29 DIAGNOSIS — R252 Cramp and spasm: Secondary | ICD-10-CM | POA: Diagnosis not present

## 2015-11-29 DIAGNOSIS — M25512 Pain in left shoulder: Secondary | ICD-10-CM | POA: Diagnosis not present

## 2015-11-29 DIAGNOSIS — J4540 Moderate persistent asthma, uncomplicated: Secondary | ICD-10-CM

## 2015-11-29 DIAGNOSIS — Z114 Encounter for screening for human immunodeficiency virus [HIV]: Secondary | ICD-10-CM

## 2015-11-29 DIAGNOSIS — R05 Cough: Secondary | ICD-10-CM

## 2015-11-29 DIAGNOSIS — N393 Stress incontinence (female) (male): Secondary | ICD-10-CM | POA: Diagnosis not present

## 2015-11-29 DIAGNOSIS — M8589 Other specified disorders of bone density and structure, multiple sites: Secondary | ICD-10-CM

## 2015-11-29 DIAGNOSIS — M4802 Spinal stenosis, cervical region: Secondary | ICD-10-CM

## 2015-11-29 DIAGNOSIS — J3489 Other specified disorders of nose and nasal sinuses: Secondary | ICD-10-CM

## 2015-11-29 DIAGNOSIS — H918X2 Other specified hearing loss, left ear: Secondary | ICD-10-CM

## 2015-11-29 DIAGNOSIS — Z0000 Encounter for general adult medical examination without abnormal findings: Secondary | ICD-10-CM | POA: Diagnosis not present

## 2015-11-29 DIAGNOSIS — Q839 Congenital malformation of breast, unspecified: Secondary | ICD-10-CM | POA: Diagnosis not present

## 2015-11-29 DIAGNOSIS — M542 Cervicalgia: Secondary | ICD-10-CM | POA: Diagnosis not present

## 2015-11-29 DIAGNOSIS — Z23 Encounter for immunization: Secondary | ICD-10-CM

## 2015-11-29 DIAGNOSIS — J329 Chronic sinusitis, unspecified: Secondary | ICD-10-CM

## 2015-11-29 DIAGNOSIS — E785 Hyperlipidemia, unspecified: Secondary | ICD-10-CM | POA: Diagnosis not present

## 2015-11-29 DIAGNOSIS — R059 Cough, unspecified: Secondary | ICD-10-CM

## 2015-11-29 LAB — POCT URINALYSIS DIP (MANUAL ENTRY)
Bilirubin, UA: NEGATIVE
Blood, UA: NEGATIVE
Glucose, UA: NEGATIVE
Ketones, POC UA: NEGATIVE
Leukocytes, UA: NEGATIVE
Nitrite, UA: NEGATIVE
Spec Grav, UA: 1.015
Urobilinogen, UA: 0.2
pH, UA: 7.5

## 2015-11-29 LAB — CBC WITH DIFFERENTIAL/PLATELET
Basophils Absolute: 0 cells/uL (ref 0–200)
Basophils Relative: 0 %
EOS PCT: 2 %
Eosinophils Absolute: 138 cells/uL (ref 15–500)
HCT: 44.6 % (ref 35.0–45.0)
HEMOGLOBIN: 14.5 g/dL (ref 11.7–15.5)
LYMPHS ABS: 2208 {cells}/uL (ref 850–3900)
Lymphocytes Relative: 32 %
MCH: 29.7 pg (ref 27.0–33.0)
MCHC: 32.5 g/dL (ref 32.0–36.0)
MCV: 91.2 fL (ref 80.0–100.0)
MONOS PCT: 6 %
MPV: 10.1 fL (ref 7.5–12.5)
Monocytes Absolute: 414 cells/uL (ref 200–950)
NEUTROS PCT: 60 %
Neutro Abs: 4140 cells/uL (ref 1500–7800)
PLATELETS: 355 10*3/uL (ref 140–400)
RBC: 4.89 MIL/uL (ref 3.80–5.10)
RDW: 14.3 % (ref 11.0–15.0)
WBC: 6.9 10*3/uL (ref 3.8–10.8)

## 2015-11-29 LAB — LIPID PANEL
CHOL/HDL RATIO: 3.4 ratio (ref <=5.0)
Cholesterol: 200 mg/dL (ref 125–200)
HDL: 58 mg/dL (ref >=46)
LDL CALC: 114 mg/dL (ref <130)
TRIGLYCERIDES: 141 mg/dL (ref <150)
VLDL: 28 mg/dL (ref <30)

## 2015-11-29 LAB — COMPREHENSIVE METABOLIC PANEL
ALBUMIN: 4 g/dL (ref 3.6–5.1)
ALT: 19 U/L (ref 6–29)
AST: 19 U/L (ref 10–35)
Alkaline Phosphatase: 80 U/L (ref 33–130)
BILIRUBIN TOTAL: 0.5 mg/dL (ref 0.2–1.2)
BUN: 20 mg/dL (ref 7–25)
CO2: 27 mmol/L (ref 20–31)
CREATININE: 0.79 mg/dL (ref 0.50–0.99)
Calcium: 9.4 mg/dL (ref 8.6–10.4)
Chloride: 103 mmol/L (ref 98–110)
GLUCOSE: 101 mg/dL — AB (ref 65–99)
Potassium: 4.8 mmol/L (ref 3.5–5.3)
SODIUM: 138 mmol/L (ref 135–146)
Total Protein: 6.5 g/dL (ref 6.1–8.1)

## 2015-11-29 LAB — VITAMIN B12: Vitamin B-12: 797 pg/mL (ref 200–1100)

## 2015-11-29 LAB — HM MAMMOGRAPHY

## 2015-11-29 LAB — TSH: TSH: 1.07 mIU/L

## 2015-11-29 MED ORDER — AMPHETAMINE-DEXTROAMPHETAMINE 20 MG PO TABS: ORAL_TABLET | ORAL | 0 refills | Status: DC

## 2015-11-29 MED ORDER — MOMETASONE FUROATE 50 MCG/ACT NA SUSP: 2.0000 | Freq: Every day | NASAL | 5 refills | Status: DC | PRN

## 2015-11-29 MED ORDER — SIMVASTATIN 40 MG PO TABS: 40.0000 mg | ORAL_TABLET | Freq: Every day | ORAL | 3 refills | Status: DC

## 2015-11-29 MED ORDER — DULOXETINE HCL 60 MG PO CPEP: 60.0000 mg | ORAL_CAPSULE | Freq: Every day | ORAL | 3 refills | Status: DC

## 2015-11-29 MED ORDER — LORATADINE 10 MG PO TABS: 10.0000 mg | ORAL_TABLET | Freq: Every day | ORAL | 3 refills | Status: AC

## 2015-11-29 MED ORDER — CYCLOBENZAPRINE HCL 10 MG PO TABS: 10.0000 mg | ORAL_TABLET | Freq: Two times a day (BID) | ORAL | 1 refills | Status: DC | PRN

## 2015-11-29 MED ORDER — IPRATROPIUM BROMIDE 0.03 % NA SOLN: 2.0000 | Freq: Four times a day (QID) | NASAL | 6 refills | Status: DC

## 2015-11-29 MED ORDER — ALBUTEROL SULFATE HFA 108 (90 BASE) MCG/ACT IN AERS: 2.0000 | INHALATION_SPRAY | Freq: Four times a day (QID) | RESPIRATORY_TRACT | 2 refills | Status: DC | PRN

## 2015-11-29 NOTE — Progress Notes (Signed)
Subjective:    Patient ID: [name], female    DOB: 08-14-1950, 65 y.o.   MRN: [id number]  11/29/2015  Annual Exam (with flu vaccine)   HPI This 65 y.o. female presents for Complete Physical Examination.  Last physical:  12-17-14 Pap smear: 07-13-14  WNL HPV negative Mammogram: 07-13-14 Colonoscopy:   S/p two colonoscopies prior to 2012; repeat in ten years.  2001; repeat 2011.  [place]. Bone density: 07-24-14 Eye exam:   Dental exam: Roommate in 1970s had hepatitis B; treated with gammaglobulin.  Had a mild care of hepatitis B.  Cervical stenosis: s/p MRI; decreased ROM of L shoulder.  Also having L shoulder pain.  Can continue to use Flexeril if helpful.  Neurosurgery.   Immunization History  Administered Date(s) Administered  . Influenza Split 12/01/2010  . Influenza,inj,Quad PF,36+ Mos 11/29/2015  . Influenza-Unspecified 09/30/2012, 10/08/2013, 10/31/2014  . Pneumococcal Conjugate-13 11/29/2015  . Tdap 12/30/2009  . Zoster 09/30/2012   ADD: takes one at 6:00am; then repeats before 12:00noon.   Insomnia: falls asleep when gets home; if takes Flexeril or nyquil, falls asleep.   Review of Systems  Constitutional: Negative for activity change, appetite change, chills, diaphoresis, fatigue, fever and unexpected weight change.  HENT: Negative for congestion, dental problem, drooling, ear discharge, ear pain, facial swelling, hearing loss, mouth sores, nosebleeds, postnasal drip, rhinorrhea, sinus pressure, sneezing, sore throat, tinnitus, trouble swallowing and voice change.   Eyes: Negative for photophobia, pain, discharge, redness, itching and visual disturbance.  Respiratory: Negative for apnea, cough, choking, chest tightness, shortness of breath, wheezing and stridor.        Snores.  Cardiovascular: Negative for chest pain, palpitations and leg swelling.  Gastrointestinal: Negative for abdominal distention, abdominal pain, anal bleeding, blood in stool,  constipation, diarrhea, nausea, rectal pain and vomiting.  Endocrine: Negative for cold intolerance, heat intolerance, polydipsia, polyphagia and polyuria.  Genitourinary: Positive for frequency and urgency. Negative for decreased urine volume, difficulty urinating, dyspareunia, dysuria, enuresis, flank pain, genital sores, hematuria, menstrual problem, pelvic pain, vaginal bleeding, vaginal discharge and vaginal pain.  Musculoskeletal: Positive for neck pain and neck stiffness. Negative for arthralgias, back pain, gait problem, joint swelling and myalgias.  Skin: Negative for color change, pallor, rash and wound.  Allergic/Immunologic: Negative for environmental allergies, food allergies and immunocompromised state.  Neurological: Negative for dizziness, tremors, seizures, syncope, facial asymmetry, speech difficulty, weakness, light-headedness, numbness and headaches.  Hematological: Negative for adenopathy. Does not bruise/bleed easily.  Psychiatric/Behavioral: Positive for decreased concentration and sleep disturbance. Negative for agitation, behavioral problems, confusion, dysphoric mood, hallucinations, self-injury and suicidal ideas. The patient is not nervous/anxious and is not hyperactive.     Past Medical History:  Diagnosis Date  . ADD (attention deficit disorder)   . Arthritis    "bunion on  right" (08/11/2014)  . Chronic bronchitis ([place])    "plenty; but not q yr" (08/11/2014)  . Depression   . Dyslipidemia   . Environmental allergies   . GERD (gastroesophageal reflux disease)   . Hearing difficulty of left ear    "grew up w/deaf parent; find myself lip reading more recently" (08/11/2014)  . Hepatitis    "exposed in college; rec'd gamma globulin; 6 months later S/S (weakness, lethargy, fevers); ; dx'd infectious hepatitis"  . History of herpes genitalis    "stress induced reoccurance that shows up on my left middle finger" (08/11/2014)  . History of hiatal hernia   . History of  stomach ulcers   . Hypercholesterolemia   .  Hyperthyroidism 1994   "postpartum only; resolved itself"  . Incisional hernia   . Internal hemorrhoids   . Leg cramping   . Osteopenia   . Seasonal asthma   . Stress incontinence, female   . Ulcer [facility])    Past Surgical History:  Procedure Laterality Date  . CESAREAN SECTION  1994  . CHALAZION EXCISION Left ~ 2013  . DILATION AND CURETTAGE OF UTERUS  X 2  . EYE SURGERY    . HERNIA REPAIR  08/11/2014  . INCISIONAL HERNIA REPAIR N/A 08/11/2014   Procedure: LAPAROSCOPIC INCISIONAL HERNIA REPAIR;  Surgeon: Coralie Keens, MD;  Location: [place];  Service: General;  Laterality: N/A;  . INCONTINENCE SURGERY  2005  . INGUINAL HERNIA REPAIR Left 08/11/2014  . INGUINAL HERNIA REPAIR Left 08/11/2014   Procedure: LEFT INGUINAL HERNIA REPAIR;  Surgeon: Coralie Keens, MD;  Location: [place];  Service: General;  Laterality: Left;  . INSERTION OF MESH Left 08/11/2014   Procedure: INSERTION OF MESH TO LEFT GROIN AND ABDOMEN;  Surgeon: Coralie Keens, MD;  Location: [place];  Service: General;  Laterality: Left;  . LAPAROSCOPIC INCISIONAL / UMBILICAL / VENTRAL HERNIA REPAIR  08/11/2014   [facility]  . NASAL POLYP EXCISION  2014  . REPAIR OF PERFORATED ULCER  1985  . TONSILLECTOMY     Allergies  Allergen Reactions  . Aspirin     Other reaction(s): GI Intolerance History of ulcers  . Bee Venom     Any insects that bite or stings  . Nsaids Other (See Comments)    History of ulcers   Current Outpatient Prescriptions  Medication Sig Dispense Refill  . acetaminophen (TYLENOL) 650 MG CR tablet Take 1,300 mg by mouth every 8 (eight) hours as needed for pain.    Marland Kitchen albuterol (PROVENTIL HFA;VENTOLIN HFA) 108 (90 Base) MCG/ACT inhaler Inhale 2 puffs into the lungs every 6 (six) hours as needed for wheezing or shortness of breath. 1 Inhaler 2  . amphetamine-dextroamphetamine (ADDERALL) 20 MG tablet Take 1 tablet bid for ADD 180 tablet 0  . cyclobenzaprine (FLEXERIL)  10 MG tablet Take 1 tablet (10 mg total) by mouth 2 (two) times daily as needed for muscle spasms. 90 tablet 1  . DULoxetine (CYMBALTA) 60 MG capsule Take 1 capsule (60 mg total) by mouth daily. 90 capsule 3  . ipratropium (ATROVENT) 0.03 % nasal spray Place 2 sprays into the nose 4 (four) times daily. Use as needed for runny nose and congestion 30 mL 6  . loratadine (CLARITIN) 10 MG tablet Take 1 tablet (10 mg total) by mouth daily. 90 tablet 3  . Magnesium 500 MG CAPS Take by mouth.    . mometasone (NASONEX) 50 MCG/ACT nasal spray Place 2 sprays into the nose daily as needed. 17 g 5  . mupirocin ointment (BACTROBAN) 2 % Apply 1 application topically 2 (two) times daily. 22 g 1  . OVER THE COUNTER MEDICATION Take 1 capsule by mouth 3 (three) times daily. OTC Magnesium Complex 2 caps daily with food    . OVER THE COUNTER MEDICATION [place] Bone Strength taking 2-3 tabs daily with food    . oxyCODONE-acetaminophen (PERCOCET) 10-325 MG tablet Take 1 tablet by mouth every 4 (four) hours as needed for pain (1 to 2 tablets every 4 to 6 hours PRN).    Marland Kitchen simvastatin (ZOCOR) 40 MG tablet Take 1 tablet (40 mg total) by mouth daily. 90 tablet 3  . cholecalciferol (VITAMIN D) 1000 units tablet Take  1,000 Units by mouth daily.    . Cyanocobalamin (VITAMIN B12 PO) Take by mouth.     No current facility-administered medications for this visit.    Social History   Social History  . Marital status: Divorced    Spouse name: N/A  . Number of children: N/A  . Years of education: N/A   Occupational History  . Teaching assistant     Full time; [employer]   Social History Main Topics  . Smoking status: Never Smoker  . Smokeless tobacco: Never Used  . Alcohol use 1.2 oz/week    1 Glasses of wine, 1 Shots of liquor per week     Comment: 08/11/2014 "0-2 glasses of wine or a mixed drink q wk"  . Drug use: No  . Sexual activity: No   Other Topics Concern  . Not on file   Social  History Narrative   Marital status: divorced; not dating      Children: 1 daughter; no grandchildren      Lives:        Employment: Optometrist      Tobacco:       Alcohol:      Drugs:       Exercise:       Family History  Problem Relation Age of Onset  . Arthritis Mother   . Heart disease Mother     CABG @ 72  . Diabetes Mother   . Osteoporosis Mother   . Hyperlipidemia Mother   . Arthritis Father   . Lymphoma Father 31    chemo  . Cancer Father     lymphoma  . Lymphoma Paternal Grandmother   . Diabetes Maternal Grandmother        Objective:    BP 114/78   Pulse 71   Temp 97.9 F (36.6 C) (Oral)   Resp 18   Ht 5' 1.5" (1.562 m)   Wt 180 lb (81.6 kg)   LMP 08/30/2008   SpO2 100%   BMI 33.46 kg/m  Physical Exam  Constitutional: She is oriented to person, place, and time. She appears well-developed and well-nourished. No distress.  HENT:  Head: Normocephalic and atraumatic.  Right Ear: External ear normal.  Left Ear: External ear normal.  Nose: Nose normal.  Mouth/Throat: Oropharynx is clear and moist.  Eyes: Conjunctivae and EOM are normal. Pupils are equal, round, and reactive to light.  Neck: Normal range of motion and full passive range of motion without pain. Neck supple. No JVD present. Carotid bruit is not present. No thyromegaly present.  Cardiovascular: Normal rate, regular rhythm and normal heart sounds.  Exam reveals no gallop and no friction rub.   No murmur heard. Pulmonary/Chest: Effort normal and breath sounds normal. She has no wheezes. She has no rales. Right breast exhibits no inverted nipple, no mass, no nipple discharge, no skin change and no tenderness. Left breast exhibits no inverted nipple, no mass, no nipple discharge, no skin change and no tenderness. Breasts are symmetrical.  Abdominal: Soft. Bowel sounds are normal. She exhibits no distension and no mass. There is no tenderness. There is no rebound and no guarding.    Musculoskeletal:       Right shoulder: Normal.       Left shoulder: Normal.       Cervical back: Normal.  Lymphadenopathy:    She has no cervical adenopathy.  Neurological: She is alert and oriented to person, place, and time. She has normal reflexes.  No cranial nerve deficit. She exhibits normal muscle tone. Coordination normal.  Skin: Skin is warm and dry. No rash noted. She is not diaphoretic. No erythema. No pallor.  Psychiatric: She has a normal mood and affect. Her behavior is normal. Judgment and thought content normal.  Nursing note and vitals reviewed.  Fall Risk  11/29/2015 05/23/2015 11/25/2013  Falls in the past year? No No No   Depression screen [facility] 2/9 11/29/2015 05/23/2015 03/31/2015 01/21/2015 01/21/2015  Decreased Interest 0 0 0 0 0  Down, Depressed, Hopeless 0 0 0 0 0  PHQ - 2 Score 0 0 0 0 0        Assessment & Plan:   1. Routine physical examination   2. Moderate persistent asthma without complication   3. Dyslipidemia   4. Need for influenza vaccination   5. Need for prophylactic vaccination against Streptococcus pneumoniae (pneumococcus)   6. Breast anomaly   7. Osteopenia of multiple sites   8. Attention deficit hyperactivity disorder (ADHD), predominantly hyperactive type   9. Stress incontinence, female   38. Screening for diabetes mellitus   11. Screening for HIV (human immunodeficiency virus)   12. Decreased ROM of left shoulder   13. Cervical stenosis of spine   14. Leg cramps   15. 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 Cough   17. Neck pain on left side   18. Pain in joint of left shoulder   19. Rhinorrhea   20. Chronic sinusitis, unspecified location    -anticipatory guidance --- exercise, weight loss, 3 servings of dairy daily. -pap smear UTD; colonoscopy UTD. -overdue for follow-up diagnostic mammogram; will schedule as diagnostic. -obtain screening labs for age; also obtain chronic disease state  management labs. -refer to physical therapy for urinary incontinence.   Orders Placed This Encounter  Procedures  . MM Digital Diagnostic Bilat    Standing Status:   Future    Standing Expiration Date:   01/28/2017    Order Specific Question:   Reason for Exam (SYMPTOM  OR DIAGNOSIS REQUIRED)    Answer:   15mm nodule L breast on last mammogram    Order Specific Question:   Preferred imaging location?    Answer:   [facility]  . Flu Vaccine QUAD 36+ mos IM  . Pneumococcal conjugate vaccine 13-valent IM  . CBC with Differential/Platelet  . Comprehensive metabolic panel    Order Specific Question:   Has the patient fasted?    Answer:   Yes  . HIV antibody  . Lipid panel    Order Specific Question:   Has the patient fasted?    Answer:   Yes  . TSH  . Vitamin B12  . VITAMIN D 25 Hydroxy (Vit-D Deficiency, Fractures)  . Hemoglobin A1c  . Ambulatory referral to Physical Therapy    Referral Priority:   Routine    Referral Type:   Physical Medicine    Referral Reason:   Specialty Services Required    Requested Specialty:   Physical Therapy    Number of Visits Requested:   1  . POCT urinalysis dipstick   Meds ordered this encounter  Medications  . albuterol (PROVENTIL HFA;VENTOLIN HFA) 108 (90 Base) MCG/ACT inhaler    Sig: Inhale 2 puffs into the lungs every 6 (six) hours as needed for wheezing or shortness of breath.    Dispense:  1 Inhaler    Refill:  2  . amphetamine-dextroamphetamine (ADDERALL) 20  MG tablet    Sig: Take 1 tablet bid for ADD    Dispense:  180 tablet    Refill:  0  . cyclobenzaprine (FLEXERIL) 10 MG tablet    Sig: Take 1 tablet (10 mg total) by mouth 2 (two) times daily as needed for muscle spasms.    Dispense:  90 tablet    Refill:  1  . DULoxetine (CYMBALTA) 60 MG capsule    Sig: Take 1 capsule (60 mg total) by mouth daily.    Dispense:  90 capsule    Refill:  3  . ipratropium (ATROVENT) 0.03 % nasal spray    Sig: Place 2 sprays into the nose 4  (four) times daily. Use as needed for runny nose and congestion    Dispense:  30 mL    Refill:  6  . loratadine (CLARITIN) 10 MG tablet    Sig: Take 1 tablet (10 mg total) by mouth daily.    Dispense:  90 tablet    Refill:  3  . mometasone (NASONEX) 50 MCG/ACT nasal spray    Sig: Place 2 sprays into the nose daily as needed.    Dispense:  17 g    Refill:  5  . simvastatin (ZOCOR) 40 MG tablet    Sig: Take 1 tablet (40 mg total) by mouth daily.    Dispense:  90 tablet    Refill:  3    Return in about 3 months (around 02/29/2016) for recheck.   Colen Eltzroth Elayne Guerin, M.D. [facility] [address] [place], [place]  [address] [phone] phone [phone] fax

## 2015-11-29 NOTE — Patient Instructions (Addendum)
IF you received an x-ray today, you will receive an invoice from [facility]. Please contact [facility] at [phone] with questions or concerns regarding your invoice.   IF you received labwork today, you will receive an invoice from [employer]. Please contact [facility] at [phone] with questions or concerns regarding your invoice.   Our billing staff will not be able to assist you with questions regarding bills from these companies.  You will be contacted with the lab results as soon as they are available. The fastest way to get your results is to activate your My Chart account. Instructions are located on the last page of this paperwork. If you have not heard from [place] regarding the results in 2 weeks, please contact this office.     Keeping You Healthy  Get These Tests  Blood Pressure- Have your blood pressure checked by your healthcare provider at least once a year.  Normal blood pressure is 120/80.  Weight- Have your body mass index (BMI) calculated to screen for obesity.  BMI is a measure of body fat based on height and weight.  You can calculate your own BMI at [employer]velBags.it  Cholesterol- Have your cholesterol checked every year.  Diabetes- Have your blood sugar checked every year if you have high blood pressure, high cholesterol, a family history of diabetes or if you are overweight.  Pap Test - Have a pap test every 1 to 5 years if you have been sexually active.  If you are older than 65 and recent pap tests have been normal you may not need additional pap tests.  In addition, if you have had a hysterectomy  for benign disease additional pap tests are not necessary.  Mammogram-Yearly mammograms are essential for early detection of breast cancer  Screening for Colon Cancer- Colonoscopy starting at age 35. Screening may begin sooner depending on your family history and other health conditions.  Follow up  colonoscopy as directed by your Gastroenterologist.  Screening for Osteoporosis- Screening begins at age 30 with bone density scanning, sooner if you are at higher risk for developing Osteoporosis.  Get these medicines  Calcium with Vitamin D- Your body requires 1200-1500 mg of Calcium a day and [phone] IU of Vitamin D a day.  You can only absorb 500 mg of Calcium at a time therefore Calcium must be taken in 2 or 3 separate doses throughout the day.  Hormones- Hormone therapy has been associated with increased risk for certain cancers and heart disease.  Talk to your healthcare provider about if you need relief from menopausal symptoms.  Aspirin- Ask your healthcare provider about taking Aspirin to prevent Heart Disease and Stroke.  Get these Immuniztions  Flu shot- Every fall  Pneumonia shot- Once after the age of 21; if you are younger ask your healthcare provider if you need a pneumonia shot.  Tetanus- Every ten years.  Zostavax- Once after the age of 59 to prevent shingles.  Take these steps  Don't smoke- Your healthcare provider can help you quit. For tips on how to quit, ask your healthcare provider or go to www.smokefree.gov or call 1-800 QUIT-NOW.  Be physically active- Exercise 5 days a week for a minimum of 30 minutes.  If you are not already physically active, start slow and gradually work up to 30 minutes of moderate physical activity.  Try walking, dancing, bike riding, swimming, etc.  Eat a healthy diet- Eat a variety of healthy foods such as fruits, vegetables,  whole grains, low fat milk, low fat cheeses, yogurt, lean meats, chicken, fish, eggs, dried beans, tofu, etc.  For more information go to www.thenutritionsource.org  Dental visit- Brush and floss teeth twice daily; visit your dentist twice a year.  Eye exam- Visit your Optometrist or Ophthalmologist yearly.  Drink alcohol in moderation- Limit alcohol intake to one drink or less a day.  Never drink and  drive.  Depression- Your emotional health is as important as your physical health.  If you're feeling down or losing interest in things you normally enjoy, please talk to your healthcare provider.  Seat Belts- can save your life; always wear one  Smoke/Carbon Monoxide detectors- These detectors need to be installed on the appropriate level of your home.  Replace batteries at least once a year.  Violence- If anyone is threatening or hurting you, please tell your healthcare provider. Living Will/ Health care power of attorney- Discuss with your healthcare provider and family.We recommend that you schedule a mammogram for breast cancer screening. Typically, you do not need a referral to do this. Please contact a local imaging center to schedule your mammogram.  [facility] - [phone]  *ask for the Radiology Department [facility] - [phone] or [phone]  [facility] - [phone] [phone] [facility] - [phone]  *ask for the [facility] - [phone]  *ask for the Radiology Department [facility] - [phone]  *ask for the [facility] - [phone]

## 2015-11-30 LAB — HIV ANTIBODY (ROUTINE TESTING W REFLEX): HIV 1&2 Ab, 4th Generation: NONREACTIVE

## 2015-11-30 LAB — VITAMIN D 25 HYDROXY (VIT D DEFICIENCY, FRACTURES): VIT D 25 HYDROXY: 26 ng/mL — AB (ref 30–100)

## 2015-11-30 LAB — HEMOGLOBIN A1C
Hgb A1c MFr Bld: 5.8 % — ABNORMAL HIGH (ref <5.7)
Mean Plasma Glucose: 120 mg/dL

## 2015-12-22 ENCOUNTER — Other Ambulatory Visit: Payer: Self-pay | Admitting: Family Medicine

## 2015-12-22 DIAGNOSIS — R05 Cough: Secondary | ICD-10-CM

## 2015-12-22 DIAGNOSIS — R059 Cough, unspecified: Secondary | ICD-10-CM

## 2016-01-10 ENCOUNTER — Other Ambulatory Visit: Payer: Self-pay | Admitting: Family Medicine

## 2016-01-10 DIAGNOSIS — Q839 Congenital malformation of breast, unspecified: Secondary | ICD-10-CM

## 2016-02-28 ENCOUNTER — Other Ambulatory Visit: Payer: Self-pay | Admitting: Family Medicine

## 2016-02-28 ENCOUNTER — Ambulatory Visit (INDEPENDENT_AMBULATORY_CARE_PROVIDER_SITE_OTHER): Payer: BC Managed Care – PPO

## 2016-02-28 ENCOUNTER — Ambulatory Visit (INDEPENDENT_AMBULATORY_CARE_PROVIDER_SITE_OTHER): Payer: BC Managed Care – PPO | Admitting: Urgent Care

## 2016-02-28 VITALS — BP 124/80 | HR 110 | Temp 100.5°F | Resp 18 | Ht 61.5 in | Wt 182.0 lb

## 2016-02-28 DIAGNOSIS — R059 Cough, unspecified: Secondary | ICD-10-CM

## 2016-02-28 DIAGNOSIS — R509 Fever, unspecified: Secondary | ICD-10-CM | POA: Diagnosis not present

## 2016-02-28 DIAGNOSIS — J209 Acute bronchitis, unspecified: Secondary | ICD-10-CM | POA: Diagnosis not present

## 2016-02-28 DIAGNOSIS — R05 Cough: Secondary | ICD-10-CM

## 2016-02-28 MED ORDER — OSELTAMIVIR PHOSPHATE 75 MG PO CAPS: 75.0000 mg | ORAL_CAPSULE | Freq: Two times a day (BID) | ORAL | 0 refills | Status: DC

## 2016-02-28 MED ORDER — AZITHROMYCIN 250 MG PO TABS: ORAL_TABLET | ORAL | 0 refills | Status: DC

## 2016-02-28 MED ORDER — HYDROCOD POLST-CPM POLST ER 10-8 MG/5ML PO SUER
5.0000 mL | Freq: Every evening | ORAL | 0 refills | Status: DC | PRN
Start: 2016-02-28 — End: 2016-04-08

## 2016-02-28 NOTE — Progress Notes (Signed)
MRN: [id number] DOB: February 02, 1950  Subjective:   [name] is a 66 y.o. female presenting for chief complaint of Headache; Generalized Body Aches; and Cough  Reports 1 day history of severe dry cough. Cough elicits shob, wheezing, head and throat pain. Also has headache, body aches. Has tried otc cough medications, benzonatate, Atrovent, Nasonex, used honey, nasal flush. Has also been using albuterol. Denies fever, chest pain, vomiting, abdominal pain, rashes. Has history of allergies, uses Claritin daily for this. Denies smoking cigarettes. Of note, patient had problems with cough in December, 2017, x-rays were negative at that time. This cough is much worse and she only had intermittent improvement in between then and now.   [name] has a current medication list which includes the following prescription(s): acetaminophen, albuterol, amphetamine-dextroamphetamine, cholecalciferol, cyanocobalamin, cyclobenzaprine, duloxetine, ipratropium, loratadine, magnesium, mometasone, mupirocin ointment, OVER THE COUNTER MEDICATION, OVER THE COUNTER MEDICATION, oxycodone-acetaminophen, and simvastatin. Also is allergic to aspirin; bee venom; and nsaids.  [name]  has a past medical history of ADD (attention deficit disorder); Arthritis; Chronic bronchitis ([place]); Depression; Dyslipidemia; Environmental allergies; GERD (gastroesophageal reflux disease); Hearing difficulty of left ear; Hepatitis; History of herpes genitalis; History of hiatal hernia; History of stomach ulcers; Hypercholesterolemia; Hyperthyroidism (1994); Incisional hernia; Internal hemorrhoids; Leg cramping; Osteopenia; Seasonal asthma; Stress incontinence, female; and Ulcer ([place]). Also  has a past surgical history that includes Repair of perforated ulcer (1985); Incontinence surgery (2005); Cesarean section (1994); Nasal polyp excision (2014); Hernia repair (08/11/2014); Inguinal hernia repair (Left, 08/11/2014); Laparoscopic incisional / umbilical / ventral  hernia repair (08/11/2014); Tonsillectomy; Dilation and curettage of uterus (X 2); Eye surgery; Chalazion excision (Left, ~ 2013); Incisional hernia repair (N/A, 08/11/2014); Inguinal hernia repair (Left, 08/11/2014); and Insertion of mesh (Left, 08/11/2014).  Objective:   Vitals: BP 124/80 (BP Location: Right Arm, Patient Position: Sitting, Cuff Size: Small)   Pulse (!) 110   Temp (!) 100.5 F (38.1 C) (Oral)   Resp 18   Ht 5' 1.5" (1.562 m)   Wt 182 lb (82.6 kg)   LMP 08/30/2008   SpO2 97%   BMI 33.83 kg/m   Physical Exam  Constitutional: She is oriented to person, place, and time. She appears well-developed and well-nourished.  HENT:  TM's intact bilaterally, no effusions or erythema. Nasal turbinates pink and moist, nasal passages patent. No sinus tenderness. Oropharynx clear, mucous membranes moist, dentition in good repair.  Eyes: Right eye exhibits no discharge. Left eye exhibits no discharge.  Neck: Normal range of motion. Neck supple.  Cardiovascular: Normal rate, regular rhythm and intact distal pulses.  Exam reveals no gallop and no friction rub.   No murmur heard. Pulmonary/Chest: No respiratory distress. She has no wheezes. She has no rales.  Lymphadenopathy:    She has no cervical adenopathy.  Neurological: She is alert and oriented to person, place, and time.  Skin: Skin is warm and dry. No rash noted.   Dg Chest 2 View  Result Date: 02/28/2016 CLINICAL DATA:  Cough and shortness of breath. EXAM: CHEST  2 VIEW COMPARISON:  05/23/2015 FINDINGS: There is peribronchial thickening consistent with bronchitis. No consolidative infiltrates or effusions. Heart size and pulmonary vascularity are normal. Moderate hiatal hernia. No bone abnormality. IMPRESSION: Bronchitic changes.  Moderate hiatal hernia. Electronically Signed   By: Lorriane Shire M.D.   On: 02/28/2016 12:56    Assessment and Plan :   1. Acute bronchitis, unspecified organism 2. Cough 3. Fever, unspecified  fever cause - Start azithromycin for bronchitis. Will cover for the  flu with oseltamivir as well. Use cough suppression medications for supportive care. Schedule APAP. RTC in 5-6 days if symptoms persist or sooner if worsening.  [name], PA-C [facility] [id number] 02/28/2016  12:25 PM

## 2016-02-28 NOTE — Patient Instructions (Addendum)
    Acute Bronchitis, Adult Acute bronchitis is when air tubes (bronchi) in the lungs suddenly get swollen. The condition can make it hard to breathe. It can also cause these symptoms:  A cough.  Coughing up clear, yellow, or green mucus.  Wheezing.  Chest congestion.  Shortness of breath.  A fever.  Body aches.  Chills.  A sore throat. Follow these instructions at home: Medicines   Take over-the-counter and prescription medicines only as told by your doctor.  If you were prescribed an antibiotic medicine, take it as told by your doctor. Do not stop taking the antibiotic even if you start to feel better. General instructions   Rest.  Drink enough fluids to keep your pee (urine) clear or pale yellow.  Avoid smoking and secondhand smoke. If you smoke and you need help quitting, ask your doctor. Quitting will help your lungs heal faster.  Use an inhaler, cool mist vaporizer, or humidifier as told by your doctor.  Keep all follow-up visits as told by your doctor. This is important. How is this prevented? To lower your risk of getting this condition again:  Wash your hands often with soap and water. If you cannot use soap and water, use hand sanitizer.  Avoid contact with people who have cold symptoms.  Try not to touch your hands to your mouth, nose, or eyes.  Make sure to get the flu shot every year. Contact a doctor if:  Your symptoms do not get better in 2 weeks. Get help right away if:  You cough up blood.  You have chest pain.  You have very bad shortness of breath.  You become dehydrated.  You faint (pass out) or keep feeling like you are going to pass out.  You keep throwing up (vomiting).  You have a very bad headache.  Your fever or chills gets worse. This information is not intended to replace advice given to you by your health care provider. Make sure you discuss any questions you have with your health care provider. Document Released:  07/05/2007 Document Revised: 08/25/2015 Document Reviewed: 07/07/2015 Elsevier Interactive Patient Education  2017 Elsevier Inc.   IF you received an x-ray today, you will receive an invoice from [facility]. Please contact [facility] at [phone] with questions or concerns regarding your invoice.   IF you received labwork today, you will receive an invoice from [facility]. Please contact [facility] at [phone] with questions or concerns regarding your invoice.   Our billing staff will not be able to assist you with questions regarding bills from these companies.  You will be contacted with the lab results as soon as they are available. The fastest way to get your results is to activate your My Chart account. Instructions are located on the last page of this paperwork. If you have not heard from us regarding the results in 2 weeks, please contact this office.      

## 2016-02-28 NOTE — Telephone Encounter (Signed)
Meds ordered this encounter  Medications  . simvastatin (ZOCOR) 40 MG tablet    Sig: TAKE 1 TABLET BY MOUTH DAILY.    Dispense:  90 tablet    Refill:  3    PATIENT LOST RX FROM OCT

## 2016-02-29 ENCOUNTER — Telehealth: Payer: Self-pay

## 2016-02-29 NOTE — Telephone Encounter (Signed)
PATIENT STATES SHE SAW MARIO MANI YESTERDAY (02/28/16) FOR BRONCHITIS AND THE FLU. HE PRESCRIBED HER TO HAVE TAMIFLU, A COUGH MEDICINE AND A Z-PACK. HE SAID SHE SHOULD BE ABLE TO RETURN TO WORK ON Wednesday. 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 SHE HAS ONLY BEEN ON THE MEDICINE FOR 2 DAYS AND WANTS TO KNOW WHEN SHE SHOULD GET SOME RELIEF? SHE ALSO WANTS TO KNOW IF SHE SHOULD TAKE HER REGULAR MEDICINE WITH THIS NEW MEDICINE? BEST PHONE [phone] (CELL) [facility] [place] [place]

## 2016-02-29 NOTE — Telephone Encounter (Signed)
Please advise 

## 2016-03-01 NOTE — Telephone Encounter (Signed)
Pt states she is improving. She has been managing fever with tylenol as she can not use ibuprofen due to stomach issue. I advised mask at work and follow work protocols. She has been treated with zpack and tamiflu and should continue improving, if worsening come back and see [place]. Verbalizes understanding

## 2016-03-01 NOTE — Telephone Encounter (Signed)
Please have patient schedule tylenol (500mg ) and alternate with ibuprofen (400-600mg ) every 6 hours. She should wear a mask as instructed in her office visit. RTC if she continues to have fever, cough despite treatment with azithromycin, Tamiflu.

## 2016-03-01 NOTE — Telephone Encounter (Signed)
Pt is needing a return call about this   Best number [phone]

## 2016-03-21 ENCOUNTER — Encounter: Payer: Self-pay | Admitting: Family Medicine

## 2016-04-08 ENCOUNTER — Ambulatory Visit (INDEPENDENT_AMBULATORY_CARE_PROVIDER_SITE_OTHER): Payer: BC Managed Care – PPO | Admitting: Physician Assistant

## 2016-04-08 VITALS — BP 116/78 | HR 94 | Temp 98.3°F | Resp 16 | Ht 61.81 in | Wt 180.0 lb

## 2016-04-08 DIAGNOSIS — J209 Acute bronchitis, unspecified: Secondary | ICD-10-CM

## 2016-04-08 DIAGNOSIS — J029 Acute pharyngitis, unspecified: Secondary | ICD-10-CM | POA: Diagnosis not present

## 2016-04-08 DIAGNOSIS — R062 Wheezing: Secondary | ICD-10-CM | POA: Diagnosis not present

## 2016-04-08 LAB — POCT INFLUENZA A/B
Influenza A, POC: NEGATIVE
Influenza B, POC: NEGATIVE

## 2016-04-08 MED ORDER — IPRATROPIUM BROMIDE 0.02 % IN SOLN
0.5000 mg | Freq: Once | RESPIRATORY_TRACT | Status: AC
  Administered 2016-04-08: 0.5 mg via RESPIRATORY_TRACT

## 2016-04-08 MED ORDER — AMOXICILLIN-POT CLAVULANATE 875-125 MG PO TABS: 1.0000 | ORAL_TABLET | Freq: Two times a day (BID) | ORAL | 0 refills | Status: DC

## 2016-04-08 MED ORDER — ALBUTEROL SULFATE (2.5 MG/3ML) 0.083% IN NEBU
2.5000 mg | INHALATION_SOLUTION | Freq: Once | RESPIRATORY_TRACT | Status: AC
  Administered 2016-04-08: 2.5 mg via RESPIRATORY_TRACT

## 2016-04-08 NOTE — Progress Notes (Signed)
[name]  MRN: [id number] DOB: [birth year]  PCP: Reginia Forts, MD  Subjective:  Pt is a 66 year old female PMH Asthma, osteopenia, HLD, ADD, depression, stress incontinence who presents to clinic for cough and body aches x 2 days.   She went on a field trip yesterday with her school in [place]. They took a tour of the [employer]. - this was unusually active for her. The next day she felt achiness in her joints. C/o coughing all night last night. Her cough is productive. Lungs feels "cracklie". Took her Ruthe Mannan - not helping much.  Body aches all over.   Denies fever, chills, nausea, vomiting, chest pain.  She has taken Claritin - not helping.  She has had the flu shot this season.    Pt was treated for bronchitis with Azithromycin on 02/28/2016. She never felt 100% better.   Review of Systems  Constitutional: Negative for chills, diaphoresis, fatigue and fever.  HENT: Positive for postnasal drip. Negative for congestion, rhinorrhea, sinus pressure, sneezing and sore throat.   Respiratory: Positive for cough and wheezing. Negative for chest tightness and shortness of breath.   Cardiovascular: Negative for chest pain and palpitations.  Gastrointestinal: Negative for abdominal pain, diarrhea, nausea and vomiting.  Musculoskeletal: Positive for myalgias.  Neurological: Negative for weakness, light-headedness and headaches.  Psychiatric/Behavioral: Positive for sleep disturbance.    Patient Active Problem List   Diagnosis Date Noted  . Sinusitis, chronic   . Asthma 01/08/2012  . Dyslipidemia   . ADD (attention deficit disorder)   . Depression   . Osteopenia   . Incisional hernia   . Stress incontinence, female     Current Outpatient Prescriptions on File Prior to Visit  Medication Sig Dispense Refill  . acetaminophen (TYLENOL) 650 MG CR tablet Take 1,300 mg by mouth every 8 (eight) hours as needed for pain.    Marland Kitchen albuterol (PROVENTIL HFA;VENTOLIN HFA) 108 (90 Base)  MCG/ACT inhaler Inhale 2 puffs into the lungs every 6 (six) hours as needed for wheezing or shortness of breath. 1 Inhaler 2  . amphetamine-dextroamphetamine (ADDERALL) 20 MG tablet Take 1 tablet bid for ADD 180 tablet 0  . cholecalciferol (VITAMIN D) 1000 units tablet Take 1,000 Units by mouth daily.    . cyclobenzaprine (FLEXERIL) 10 MG tablet Take 1 tablet (10 mg total) by mouth 2 (two) times daily as needed for muscle spasms. 90 tablet 1  . DULoxetine (CYMBALTA) 60 MG capsule Take 1 capsule (60 mg total) by mouth daily. 90 capsule 3  . ipratropium (ATROVENT) 0.03 % nasal spray Place 2 sprays into the nose 4 (four) times daily. Use as needed for runny nose and congestion 30 mL 6  . loratadine (CLARITIN) 10 MG tablet Take 1 tablet (10 mg total) by mouth daily. 90 tablet 3  . Magnesium 500 MG CAPS Take by mouth.    . mometasone (NASONEX) 50 MCG/ACT nasal spray Place 2 sprays into the nose daily as needed. 17 g 5  . OVER THE COUNTER MEDICATION Take 1 capsule by mouth 3 (three) times daily. OTC Magnesium Complex 2 caps daily with food    . OVER THE COUNTER MEDICATION [place] Bone Strength taking 2-3 tabs daily with food    . simvastatin (ZOCOR) 40 MG tablet TAKE 1 TABLET BY MOUTH DAILY. 90 tablet 3  . azithromycin (ZITHROMAX) 250 MG tablet Start with 2 tablets today, then 1 daily thereafter. (Patient not taking: Reported on 04/08/2016) 6 tablet 0  . chlorpheniramine-HYDROcodone (TUSSIONEX  PENNKINETIC ER) 10-8 MG/5ML SUER Take 5 mLs by mouth at bedtime as needed for cough. (Patient not taking: Reported on 04/08/2016) 100 mL 0  . Cyanocobalamin (VITAMIN B12 PO) Take by mouth.    . mupirocin ointment (BACTROBAN) 2 % Apply 1 application topically 2 (two) times daily. (Patient not taking: Reported on 04/08/2016) 22 g 1  . oseltamivir (TAMIFLU) 75 MG capsule Take 1 capsule (75 mg total) by mouth 2 (two) times daily. (Patient not taking: Reported on 04/08/2016) 10 capsule 0  . oxyCODONE-acetaminophen  (PERCOCET) 10-325 MG tablet Take 1 tablet by mouth every 4 (four) hours as needed for pain (1 to 2 tablets every 4 to 6 hours PRN).     No current facility-administered medications on file prior to visit.     Allergies  Allergen Reactions  . Aspirin     Other reaction(s): GI Intolerance History of ulcers  . Bee Venom     Any insects that bite or stings  . Nsaids Other (See Comments)    History of ulcers     Objective:  BP 116/78   Pulse 94   Temp 98.3 F (36.8 C)   Resp 16   Ht 5' 1.81" (1.57 m)   Wt 180 lb (81.6 kg)   LMP 08/30/2008   SpO2 97%   BMI 33.12 kg/m   Physical Exam  Constitutional: She is oriented to person, place, and time and well-developed, well-nourished, and in no distress. No distress.  HENT:  Right Ear: Tympanic membrane is bulging.  Left Ear: Tympanic membrane is bulging.  Mouth/Throat: Oropharynx is clear and moist and mucous membranes are normal.  Cardiovascular: Normal rate, regular rhythm and normal heart sounds.   Pulmonary/Chest: Effort normal. She has wheezes in the right middle field and the left middle field. She has rhonchi in the right middle field, the right lower field and the left middle field. She has rales in the right lower field.  Neurological: She is alert and oriented to person, place, and time. GCS score is 15.  Skin: Skin is warm and dry.  Psychiatric: Mood, memory, affect and judgment normal.  Vitals reviewed.   Assessment and Plan :  1. Acute bronchitis, unspecified organism 2. Sore throat 3. Wheezing - amoxicillin-clavulanate (AUGMENTIN) 875-125 MG tablet; Take 1 tablet by mouth 2 (two) times daily.  Dispense: 20 tablet; Refill: 0 - POCT Influenza A/B - albuterol (PROVENTIL) (2.5 MG/3ML) 0.083% nebulizer solution 2.5 mg; Take 3 mLs (2.5 mg total) by nebulization once. - ipratropium (ATROVENT) nebulizer solution 0.5 mg; Take 2.5 mLs (0.5 mg total) by nebulization once. - Pt reports some relief following breathing  treatment. Physical Exam reveals rales RLL. Recent treatment with Azithromycin for bronchitis. Will treat with Augmentin. Supportive care: flonase, push fluids, rest, Albuterol PRN q4-6 hrs. RTC in 7-10 days if no improvement.   Mercer Pod, PA-C  [facility] 04/08/2016 3:52 PM

## 2016-04-08 NOTE — Patient Instructions (Addendum)
Flonase - 2 sprays each nostril 2x/day Delsym for daytime cough Tessalon for nighttime cough Albuterol - 2 puffs every 4-6 hours as needed for shortness of breath/wheezing Antibiotic - take this as directed.  Drink 1-2 liters of water/day For sore throat - Warm salt water gargles, hot tea with honey and lemon, gargle liquid benadryl  Thank you for coming in today. I hope you feel we met your needs.  Feel free to call [facility] if you have any questions or further requests.  Please consider signing up for MyChart if you do not already have it, as this is a great way to communicate with me.  Best,  Whitney McVey, PA-C  IF you received an x-ray today, you will receive an invoice from [facility]. Please contact [facility] at [phone] with questions or concerns regarding your invoice.   IF you received labwork today, you will receive an invoice from [facility]. Please contact [facility] at [phone] with questions or concerns regarding your invoice.   Our billing staff will not be able to assist you with questions regarding bills from these companies.  You will be contacted with the lab results as soon as they are available. The fastest way to get your results is to activate your My Chart account. Instructions are located on the last page of this paperwork. If you have not heard from [place] regarding the results in 2 weeks, please contact this office.

## 2016-04-12 ENCOUNTER — Other Ambulatory Visit: Payer: Self-pay | Admitting: Family Medicine

## 2016-04-12 NOTE — Telephone Encounter (Signed)
Left message to schedule visit before this supply is exhausted.  Meds ordered this encounter  Medications  . DULoxetine (CYMBALTA) 60 MG capsule    Sig: TAKE 1 CAPSULE DAILY    Dispense:  90 capsule    Refill:  0    Please notify patient that s/he needs an office visit +/- labsfor additional refills.

## 2016-04-15 ENCOUNTER — Other Ambulatory Visit: Payer: Self-pay | Admitting: Family Medicine

## 2016-04-17 ENCOUNTER — Other Ambulatory Visit: Payer: Self-pay | Admitting: Physician Assistant

## 2016-04-17 DIAGNOSIS — M25512 Pain in left shoulder: Secondary | ICD-10-CM

## 2016-04-17 DIAGNOSIS — R059 Cough, unspecified: Secondary | ICD-10-CM

## 2016-04-17 DIAGNOSIS — R05 Cough: Secondary | ICD-10-CM

## 2016-04-18 ENCOUNTER — Telehealth: Payer: Self-pay | Admitting: Family Medicine

## 2016-04-18 DIAGNOSIS — R059 Cough, unspecified: Secondary | ICD-10-CM

## 2016-04-18 DIAGNOSIS — M25512 Pain in left shoulder: Secondary | ICD-10-CM

## 2016-04-18 DIAGNOSIS — R05 Cough: Secondary | ICD-10-CM

## 2016-04-18 NOTE — Telephone Encounter (Signed)
PATIENT STATES SHE HAS SEEN 2 PROVIDERS FOR HER BRONCHITIS STARTING IN [place]. 2018. SHE HAS TAKEN 2 ROUNDS OF AMOXICILLIN AND SHE IS STILL NO BETTER. SHE IS STILL COUGHING UP A LOT OF MUCOUS. SHE IS OUT OF HER COUGH SYRUP. [place] TO TELL HER WHAT SHE SHOULD DO NEXT? BEST PHONE [phone] (CELL) [facility] [place] AND THEIR PHONE IS [phone]  [place]

## 2016-04-19 NOTE — Telephone Encounter (Signed)
Please see last 2 pa visits and advise

## 2016-04-21 MED ORDER — PREDNISONE 20 MG PO TABS: ORAL_TABLET | ORAL | 0 refills | Status: DC

## 2016-04-21 MED ORDER — BENZONATATE 100 MG PO CAPS: 200.0000 mg | ORAL_CAPSULE | Freq: Three times a day (TID) | ORAL | 0 refills | Status: DC | PRN

## 2016-04-21 NOTE — Telephone Encounter (Signed)
Call --- I have reviewed her record.  She has received excellent care at her two previous Is she having shortness of breath?  Is she running fever?.  I have sent in Prednisone to her pharmacy.  I also recommend Mucinex DM one tablet twice daily for the next ten days.  I also want her to take the Tessalon Perles three times a day in addition to Mucinex DM.  Lastly, I recommend her using Albuterol inhaler four times daily scheduled for the next week.

## 2016-04-21 NOTE — Telephone Encounter (Signed)
Pt advised Does not want to take prednisone just yet "cause im on spring break" Will get and start prednisone if needed or come see [place] .  Denies new sob or fever

## 2016-06-19 ENCOUNTER — Telehealth: Payer: Self-pay | Admitting: *Deleted

## 2016-06-19 NOTE — Telephone Encounter (Addendum)
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 Pt called states she missed a call, she hopes was to schedule an appt.

## 2016-06-30 ENCOUNTER — Other Ambulatory Visit: Payer: Self-pay | Admitting: Physician Assistant

## 2016-07-01 NOTE — Telephone Encounter (Signed)
Last seen for exam 10/2015, but seen since for acute

## 2016-07-03 ENCOUNTER — Telehealth: Payer: Self-pay | Admitting: Family Medicine

## 2016-07-03 NOTE — Telephone Encounter (Signed)
Call -- pt is overdue for follow-up of anxiety/depression.  Please schedule OV with me in upcoming 1-2 months for follow-up.

## 2016-07-03 NOTE — Telephone Encounter (Signed)
LMOM FOR PT TO CALL AND SET UP AN OV WITH [facility] FOR ANXIETY AND DEPRESSION PT IS OVER DUE FOR OV

## 2016-07-22 ENCOUNTER — Other Ambulatory Visit: Payer: Self-pay | Admitting: Family Medicine

## 2016-07-22 DIAGNOSIS — J3489 Other specified disorders of nose and nasal sinuses: Secondary | ICD-10-CM

## 2016-07-22 DIAGNOSIS — R05 Cough: Secondary | ICD-10-CM

## 2016-07-22 DIAGNOSIS — R059 Cough, unspecified: Secondary | ICD-10-CM

## 2016-07-25 ENCOUNTER — Other Ambulatory Visit: Payer: Self-pay | Admitting: Family Medicine

## 2016-07-25 DIAGNOSIS — R05 Cough: Secondary | ICD-10-CM

## 2016-07-25 DIAGNOSIS — R059 Cough, unspecified: Secondary | ICD-10-CM

## 2016-07-27 NOTE — Telephone Encounter (Signed)
Call --- patient is overdue for six month follow-up for high cholesterol.  Last visit 10/2015.

## 2016-08-04 ENCOUNTER — Ambulatory Visit (INDEPENDENT_AMBULATORY_CARE_PROVIDER_SITE_OTHER): Payer: BC Managed Care – PPO | Admitting: Family Medicine

## 2016-08-04 ENCOUNTER — Encounter: Payer: Self-pay | Admitting: Family Medicine

## 2016-08-04 VITALS — BP 122/78 | HR 73 | Temp 98.0°F | Resp 18 | Ht 61.42 in | Wt 174.0 lb

## 2016-08-04 DIAGNOSIS — E559 Vitamin D deficiency, unspecified: Secondary | ICD-10-CM

## 2016-08-04 DIAGNOSIS — E7439 Other disorders of intestinal carbohydrate absorption: Secondary | ICD-10-CM | POA: Diagnosis not present

## 2016-08-04 DIAGNOSIS — J4540 Moderate persistent asthma, uncomplicated: Secondary | ICD-10-CM

## 2016-08-04 DIAGNOSIS — F901 Attention-deficit hyperactivity disorder, predominantly hyperactive type: Secondary | ICD-10-CM

## 2016-08-04 DIAGNOSIS — F329 Major depressive disorder, single episode, unspecified: Secondary | ICD-10-CM

## 2016-08-04 DIAGNOSIS — E785 Hyperlipidemia, unspecified: Secondary | ICD-10-CM | POA: Diagnosis not present

## 2016-08-04 MED ORDER — AMPHETAMINE-DEXTROAMPHETAMINE 20 MG PO TABS: ORAL_TABLET | ORAL | 0 refills | Status: DC

## 2016-08-04 MED ORDER — DULOXETINE HCL 60 MG PO CPEP: 60.0000 mg | ORAL_CAPSULE | Freq: Every day | ORAL | 1 refills | Status: DC

## 2016-08-04 NOTE — Progress Notes (Signed)
Subjective:    Patient ID: [name], female    DOB: [birth year], 66 y.o.   MRN: [id number]  08/04/2016  Depression (follow-up); Hypothyroidism; and Hyperlipidemia   HPI This 66 y.o. female presents for follow-up hypercholesterolemia, hypothyroidism, ADHD.  Last visit nine months ago at [facility]. Patient reports good compliance with medication, good tolerance to medication, and good symptom control.  Referred to physical therapy for urinary incontinence at last visit. Daughter recently had to undergo ED visit for syncopal event. Pt recent evicted from rental home; currently has no where to live; will get through this time.  Coping well. Out of school for the summer.     BP Readings from Last 3 Encounters:  08/04/16 122/78  04/08/16 116/78  02/28/16 124/80   Wt Readings from Last 3 Encounters:  08/04/16 174 lb (78.9 kg)  04/08/16 180 lb (81.6 kg)  02/28/16 182 lb (82.6 kg)   Immunization History  Administered Date(s) Administered  . Influenza Split 12/01/2010  . Influenza,inj,Quad PF,36+ Mos 11/29/2015  . Influenza-Unspecified 09/30/2012, 10/08/2013, 10/31/2014  . Pneumococcal Conjugate-13 11/29/2015  . Tdap 12/30/2009  . Zoster 09/30/2012       Review of Systems  Constitutional: Negative for chills, diaphoresis, fatigue and fever.  Eyes: Negative for visual disturbance.  Respiratory: Negative for cough and shortness of breath.   Cardiovascular: Negative for chest pain, palpitations and leg swelling.  Gastrointestinal: Negative for abdominal pain, constipation, diarrhea, nausea and vomiting.  Endocrine: Negative for cold intolerance, heat intolerance, polydipsia, polyphagia and polyuria.  Musculoskeletal: Positive for neck pain and neck stiffness.  Neurological: Negative for dizziness, tremors, seizures, syncope, facial asymmetry, speech difficulty, weakness, light-headedness, numbness and headaches.  Psychiatric/Behavioral: Positive for decreased concentration and sleep  disturbance. Negative for dysphoric mood and self-injury. The patient is nervous/anxious.     Past Medical History:  Diagnosis Date  . ADD (attention deficit disorder)   . Arthritis    "bunion on  right" (08/11/2014)  . Chronic bronchitis ([place])    "plenty; but not q yr" (08/11/2014)  . Depression   . Dyslipidemia   . Environmental allergies   . GERD (gastroesophageal reflux disease)   . Hearing difficulty of left ear    "grew up w/deaf parent; find myself lip reading more recently" (08/11/2014)  . Hepatitis    "exposed in college; rec'd gamma globulin; 6 months later S/S (weakness, lethargy, fevers); ; dx'd infectious hepatitis"  . History of herpes genitalis    "stress induced reoccurance that shows up on my left middle finger" (08/11/2014)  . History of hiatal hernia   . History of stomach ulcers   . Hypercholesterolemia   . Hyperthyroidism 1994   "postpartum only; resolved itself"  . Incisional hernia   . Internal hemorrhoids   . Leg cramping   . Osteopenia   . Seasonal asthma   . Stress incontinence, female   . Ulcer    Past Surgical History:  Procedure Laterality Date  . CESAREAN SECTION  1994  . CHALAZION EXCISION Left ~ 2013  . DILATION AND CURETTAGE OF UTERUS  X 2  . EYE SURGERY    . HERNIA REPAIR  08/11/2014  . INCISIONAL HERNIA REPAIR N/A 08/11/2014   Procedure: LAPAROSCOPIC INCISIONAL HERNIA REPAIR;  Surgeon: Coralie Keens, MD;  Location: [place];  Service: General;  Laterality: N/A;  . INCONTINENCE SURGERY  2005  . INGUINAL HERNIA REPAIR Left 08/11/2014  . INGUINAL HERNIA REPAIR Left 08/11/2014   Procedure: LEFT INGUINAL HERNIA REPAIR;  Surgeon: Coralie Keens,  MD;  Location: [place];  Service: General;  Laterality: Left;  . INSERTION OF MESH Left 08/11/2014   Procedure: INSERTION OF MESH TO LEFT GROIN AND ABDOMEN;  Surgeon: Coralie Keens, MD;  Location: [place];  Service: General;  Laterality: Left;  . LAPAROSCOPIC INCISIONAL / UMBILICAL / VENTRAL HERNIA REPAIR   08/11/2014   [facility]  . NASAL POLYP EXCISION  2014  . REPAIR OF PERFORATED ULCER  1985  . TONSILLECTOMY     Allergies  Allergen Reactions  . Aspirin     Other reaction(s): GI Intolerance History of ulcers  . Bee Venom     Any insects that bite or stings  . Nsaids Other (See Comments)    History of ulcers    Social History   Social History  . Marital status: Divorced    Spouse name: N/A  . Number of children: N/A  . Years of education: N/A   Occupational History  . Teaching assistant     Full time; [employer]   Social History Main Topics  . Smoking status: Never Smoker  . Smokeless tobacco: Never Used  . Alcohol use 1.2 oz/week    1 Glasses of wine, 1 Shots of liquor per week     Comment: 08/11/2014 "0-2 glasses of wine or a mixed drink q wk"  . Drug use: No  . Sexual activity: No   Other Topics Concern  . Not on file   Social History Narrative   Marital status: divorced; not dating      Children: 1 daughter; no grandchildren      Lives:        Employment: Optometrist      Tobacco:       Alcohol:      Drugs:       Exercise:       Family History  Problem Relation Age of Onset  . Arthritis Mother   . Heart disease Mother        CABG @ 66  . Diabetes Mother   . Osteoporosis Mother   . Hyperlipidemia Mother   . Arthritis Father   . Lymphoma Father 13       chemo  . Cancer Father        lymphoma  . Lymphoma Paternal Grandmother   . Diabetes Maternal Grandmother        Objective:    BP 122/78   Pulse 73   Temp 98 F (36.7 C) (Oral)   Resp 18   Ht 5' 1.42" (1.56 m)   Wt 174 lb (78.9 kg)   LMP 08/30/2008   SpO2 98%   BMI 32.43 kg/m  Physical Exam  Constitutional: She is oriented to person, place, and time. She appears well-developed and well-nourished. No distress.  HENT:  Head: Normocephalic and atraumatic.  Right Ear: External ear normal.  Left Ear: External ear normal.  Nose: Nose normal.  Mouth/Throat: Oropharynx  is clear and moist.  Eyes: Conjunctivae and EOM are normal. Pupils are equal, round, and reactive to light.  Neck: Normal range of motion. Neck supple. Carotid bruit is not present. No thyromegaly present.  Cardiovascular: Normal rate, regular rhythm, normal heart sounds and intact distal pulses.  Exam reveals no gallop and no friction rub.   No murmur heard. Pulmonary/Chest: Effort normal and breath sounds normal. She has no wheezes. She has no rales.  Abdominal: Soft. Bowel sounds are normal. She exhibits no distension and no mass. There is no tenderness. There  is no rebound and no guarding.  Lymphadenopathy:    She has no cervical adenopathy.  Neurological: She is alert and oriented to person, place, and time. No cranial nerve deficit.  Skin: Skin is warm and dry. No rash noted. She is not diaphoretic. No erythema. No pallor.  Psychiatric: Judgment and thought content normal. Her mood appears anxious. Her speech is tangential. She is hyperactive. Cognition and memory are normal.   Depression screen [facility] 2/9 08/04/2016 04/08/2016 02/28/2016 11/29/2015 05/23/2015  Decreased Interest 0 0 0 0 0  Down, Depressed, Hopeless 0 0 0 0 0  PHQ - 2 Score 0 0 0 0 0   Fall Risk  08/04/2016 04/08/2016 02/28/2016 11/29/2015 05/23/2015  Falls in the past year? Yes No No No No  Number falls in past yr: 2 or more - - - -  Injury with Fall? No - - - -        Assessment & Plan:   1. Dyslipidemia   2. Attention deficit hyperactivity disorder (ADHD), predominantly hyperactive type   3. Reactive depression   4. Moderate persistent asthma without complication   5. Glucose intolerance   6. Vitamin D deficiency    -moderately controlled; obtain labs. -refills provided on Cymbalta and Adderall. -continue other medications at current doses.   Orders Placed This Encounter  Procedures  . CBC with Differential/Platelet  . Comprehensive metabolic panel  . Hemoglobin A1c  . VITAMIN D 25 Hydroxy (Vit-D Deficiency,  Fractures)   Meds ordered this encounter  Medications  . DULoxetine (CYMBALTA) 60 MG capsule    Sig: Take 1 capsule (60 mg total) by mouth daily.    Dispense:  90 capsule    Refill:  1  . amphetamine-dextroamphetamine (ADDERALL) 20 MG tablet    Sig: Take 1 tablet bid-tid for ADD    Dispense:  180 tablet    Refill:  0    No Follow-up on file.   Nahomy Limburg Elayne Guerin, M.D. Primary [facility] at [facility] [place] [address] [place], [place]  [address] [phone] phone [phone] fax

## 2016-08-04 NOTE — Patient Instructions (Signed)
     IF you received an x-ray today, you will receive an invoice from [facility]. Please contact [facility] at [phone] with questions or concerns regarding your invoice.   IF you received labwork today, you will receive an invoice from [facility]. Please contact [facility] at [phone] with questions or concerns regarding your invoice.   Our billing staff will not be able to assist you with questions regarding bills from these companies.  You will be contacted with the lab results as soon as they are available. The fastest way to get your results is to activate your My Chart account. Instructions are located on the last page of this paperwork. If you have not heard from us regarding the results in 2 weeks, please contact this office.     

## 2016-08-05 LAB — HEMOGLOBIN A1C
Est. average glucose Bld gHb Est-mCnc: 126 mg/dL
Hgb A1c MFr Bld: 6 % — ABNORMAL HIGH (ref 4.8–5.6)

## 2016-08-05 LAB — COMPREHENSIVE METABOLIC PANEL
ALK PHOS: 99 IU/L (ref 39–117)
ALT: 24 IU/L (ref 0–32)
AST: 23 IU/L (ref 0–40)
Albumin/Globulin Ratio: 1.7 (ref 1.2–2.2)
Albumin: 4.5 g/dL (ref 3.6–4.8)
BILIRUBIN TOTAL: 0.2 mg/dL (ref 0.0–1.2)
BUN/Creatinine Ratio: 19 (ref 12–28)
BUN: 15 mg/dL (ref 8–27)
CHLORIDE: 103 mmol/L (ref 96–106)
CO2: 26 mmol/L (ref 20–29)
Calcium: 9.7 mg/dL (ref 8.7–10.3)
Creatinine, Ser: 0.77 mg/dL (ref 0.57–1.00)
GFR calc non Af Amer: 81 mL/min/{1.73_m2} (ref >59)
GFR, EST AFRICAN AMERICAN: 94 mL/min/{1.73_m2} (ref >59)
GLOBULIN, TOTAL: 2.6 g/dL (ref 1.5–4.5)
GLUCOSE: 91 mg/dL (ref 65–99)
Potassium: 4.9 mmol/L (ref 3.5–5.2)
Sodium: 143 mmol/L (ref 134–144)
Total Protein: 7.1 g/dL (ref 6.0–8.5)

## 2016-08-05 LAB — CBC WITH DIFFERENTIAL/PLATELET
BASOS ABS: 0 10*3/uL (ref 0.0–0.2)
BASOS: 0 %
EOS (ABSOLUTE): 0.1 10*3/uL (ref 0.0–0.4)
EOS: 1 %
HEMATOCRIT: 40.2 % (ref 34.0–46.6)
HEMOGLOBIN: 13.3 g/dL (ref 11.1–15.9)
IMMATURE GRANS (ABS): 0 10*3/uL (ref 0.0–0.1)
Immature Granulocytes: 0 %
LYMPHS ABS: 2.8 10*3/uL (ref 0.7–3.1)
LYMPHS: 39 %
MCH: 28.7 pg (ref 26.6–33.0)
MCHC: 33.1 g/dL (ref 31.5–35.7)
MCV: 87 fL (ref 79–97)
MONOCYTES: 8 %
Monocytes Absolute: 0.5 10*3/uL (ref 0.1–0.9)
NEUTROS ABS: 3.6 10*3/uL (ref 1.4–7.0)
Neutrophils: 52 %
Platelets: 408 10*3/uL — ABNORMAL HIGH (ref 150–379)
RBC: 4.64 x10E6/uL (ref 3.77–5.28)
RDW: 14.5 % (ref 12.3–15.4)
WBC: 7.1 10*3/uL (ref 3.4–10.8)

## 2016-08-05 LAB — VITAMIN D 25 HYDROXY (VIT D DEFICIENCY, FRACTURES): Vit D, 25-Hydroxy: 30.8 ng/mL (ref 30.0–100.0)

## 2016-08-21 ENCOUNTER — Other Ambulatory Visit: Payer: Self-pay | Admitting: Family Medicine

## 2016-09-07 ENCOUNTER — Other Ambulatory Visit: Payer: Self-pay | Admitting: Podiatry

## 2016-09-07 ENCOUNTER — Ambulatory Visit (INDEPENDENT_AMBULATORY_CARE_PROVIDER_SITE_OTHER): Payer: BC Managed Care – PPO | Admitting: Podiatry

## 2016-09-07 ENCOUNTER — Ambulatory Visit: Payer: BC Managed Care – PPO | Admitting: Podiatry

## 2016-09-07 ENCOUNTER — Ambulatory Visit (INDEPENDENT_AMBULATORY_CARE_PROVIDER_SITE_OTHER): Payer: BC Managed Care – PPO

## 2016-09-07 ENCOUNTER — Encounter: Payer: Self-pay | Admitting: Podiatry

## 2016-09-07 DIAGNOSIS — M79671 Pain in right foot: Secondary | ICD-10-CM | POA: Diagnosis not present

## 2016-09-07 DIAGNOSIS — M779 Enthesopathy, unspecified: Secondary | ICD-10-CM

## 2016-09-07 NOTE — Progress Notes (Signed)
Subjective:    Patient ID: [name], female   DOB: 66 y.o.   MRN: [id number]   HPI patient states that overall she's doing pretty well with the left foot starting to get digital deformity like the right one and she was just concerned about some discoloration on top of her right foot and also that her second toe pushes against the third toe without pain but she was concerned about the nail    ROS      Objective:  Physical Exam neurovascular status found to be intact with patient having good digital perfusion was noted to have moderate reduction range of motion first MPJ secondary to some arthritis within the joint surface and has mild lateral movement of the distal portion second digit right were we placed the toe in this position to try to get it between his first and third toe. There is no redness there is no bony prominence and no pain and there is a small white spot measuring about 1.5 cm x 1.5 cm on the dorsum of the right foot. 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 I do not see anything to treat currently is nothing is really hurting and I would rather wait before would consider doing anything with this left foot. Patient will be seen back for [place] to recheck as needed  X-ray indicates there is arthritis the first MPJ right with pins in place small spurring dorsal and the second toe has fused with slight rotation of the distal phalanx but it isn't a good position in conjunction to the hallux third toe

## 2016-11-08 ENCOUNTER — Other Ambulatory Visit: Payer: Self-pay | Admitting: Family Medicine

## 2016-11-08 DIAGNOSIS — J329 Chronic sinusitis, unspecified: Secondary | ICD-10-CM

## 2016-12-09 ENCOUNTER — Other Ambulatory Visit: Payer: Self-pay | Admitting: Family Medicine

## 2016-12-09 ENCOUNTER — Ambulatory Visit: Payer: BC Managed Care – PPO | Admitting: Family Medicine

## 2016-12-09 ENCOUNTER — Encounter: Payer: Self-pay | Admitting: Family Medicine

## 2016-12-09 ENCOUNTER — Ambulatory Visit (INDEPENDENT_AMBULATORY_CARE_PROVIDER_SITE_OTHER): Payer: BC Managed Care – PPO

## 2016-12-09 VITALS — BP 130/68 | HR 67 | Temp 97.7°F | Resp 14 | Ht 61.5 in | Wt 176.0 lb

## 2016-12-09 DIAGNOSIS — M25561 Pain in right knee: Secondary | ICD-10-CM

## 2016-12-09 DIAGNOSIS — M25361 Other instability, right knee: Secondary | ICD-10-CM

## 2016-12-09 DIAGNOSIS — M7502 Adhesive capsulitis of left shoulder: Secondary | ICD-10-CM | POA: Diagnosis not present

## 2016-12-09 MED ORDER — AMPHETAMINE-DEXTROAMPHETAMINE 20 MG PO TABS: ORAL_TABLET | ORAL | 0 refills | Status: DC

## 2016-12-09 NOTE — Telephone Encounter (Signed)
Requesting medication refill on Adderall. Approved; will need follow-up in 3 months.

## 2016-12-09 NOTE — Patient Instructions (Addendum)
Knee pain may be related to arthritis or a meniscus issue. Try wearing the brace for now, bu would recommend following up with orthopedist to evaluate your knee further with the longstanding stability symptoms. Tylenol is safest for pain for now.  Return to the clinic or go to the nearest emergency room if any of your symptoms worsen or new symptoms occur.   As we discussed, I would follow up with orthopaedic doctor for your left shoulder issue if that has worsened, or decreased movement as that may be a frozen shoulder. I will refer you for both your left shoulder and right knee.   Vitamin D over the counter at [id number] units should be sufficient based on your last reading. I would recommend discussing that further as well as magnesium with your primary care provider.    Knee Pain, Adult Knee pain in adults is common. It can be caused by many things, including:  Arthritis.  A fluid-filled sac (cyst) or growth in your knee.  An infection in your knee.  An injury that will not heal.  Damage, swelling, or irritation of the tissues that support your knee.  Knee pain is usually not a sign of a serious problem. The pain may go away on its own with time and rest. If it does not, a health care provider may order tests to find the cause of the pain. These may include:  Imaging tests, such as an X-ray, MRI, or ultrasound.  Joint aspiration. In this test, fluid is removed from the knee.  Arthroscopy. In this test, a lighted tube is inserted into knee and an image is projected onto a TV screen.  A biopsy. In this test, a sample of tissue is removed from the body and studied under a microscope.  Follow these instructions at home: Pay attention to any changes in your symptoms. Take these actions to relieve your pain. Activity  Rest your knee.  Do not do things that cause pain or make pain worse.  Avoid high-impact activities or exercises, such as running, jumping rope, or doing jumping  jacks. General instructions  Take over-the-counter and prescription medicines only as told by your health care provider.  Raise (elevate) your knee above the level of your heart when you are sitting or lying down.  Sleep with a pillow under your knee.  If directed, apply ice to the knee: ? Put ice in a plastic bag. ? Place a towel between your skin and the bag. ? Leave the ice on for 20 minutes, 2-3 times a day.  Ask your health care provider if you should wear an elastic knee support.  Lose weight if you are overweight. Extra weight can put pressure on your knee.  Do not use any products that contain nicotine or tobacco, such as cigarettes and e-cigarettes. Smoking may slow the healing of any bone and joint problems that you may have. If you need help quitting, ask your health care provider. Contact a health care provider if:  Your knee pain continues, changes, or gets worse.  You have a fever along with knee pain.  Your knee buckles or locks up.  Your knee swells, and the swelling becomes worse. Get help right away if:  Your knee feels warm to the touch.  You cannot move your knee.  You have severe pain in your knee.  You have chest pain.  You have trouble breathing. Summary  Knee pain in adults is common. It can be caused by many things, including,  arthritis, infection, cysts, or injury.  Knee pain is usually not a sign of a serious problem, but if it does not go away, a health care provider may perform tests to know the cause of the pain.  Pay attention to any changes in your symptoms. Relieve your pain with rest, medicines, light activity, and use of ice.  Get help if your pain continues or becomes very severe, or if your knee buckles or locks up, or if you have chest pain or trouble breathing. This information is not intended to replace advice given to you by your health care provider. Make sure you discuss any questions you have with your health care  provider. Document Released: 11/13/2006 Document Revised: 01/07/2016 Document Reviewed: 01/07/2016 Elsevier Interactive Patient Education  2018 [employer].   Adhesive Capsulitis Adhesive capsulitis is inflammation of the tendons and ligaments that surround the shoulder joint (shoulder capsule). This condition causes the shoulder to become stiff and painful to move. Adhesive capsulitis is also called frozen shoulder. What are the causes? This condition may be caused by:  An injury to the shoulder joint.  Straining the shoulder.  Not moving the shoulder for a period of time. This can happen if your arm was injured or in a sling.  Long-standing health problems, such as: ? Diabetes. ? Thyroid problems. ? Heart disease. ? Stroke. ? Rheumatoid arthritis. ? Lung disease.  In some cases, the cause may not be known. What increases the risk? This condition is more likely to develop in:  Women.  People who are older than 66 years of age.  What are the signs or symptoms? Symptoms of this condition include:  Pain in the shoulder when moving the arm. There may also be pain when parts of the shoulder are touched. The pain is worse at night or when at rest.  Soreness or aching in the shoulder.  Inability to move the shoulder normally.  Muscle spasms.  How is this diagnosed? This condition is diagnosed with a physical exam and imaging tests, such as an X-ray or MRI. How is this treated? This condition may be treated with:  Treatment of the underlying cause or condition.  Physical therapy. This involves performing exercises to get the shoulder moving again.  Medicine. Medicine may be given to relieve pain, inflammation, or muscle spasms.  Steroid injections into the shoulder joint.  Shoulder manipulation. This is a procedure to move the shoulder into another position. It is done after you are given a medicine to make you fall asleep (general anesthetic). The joint may also be  injected with salt water at high pressure to break down scarring.  Surgery. This may be done in severe cases when other treatments have failed.  Although most people recover completely from adhesive capsulitis, some may not regain the full movement of the shoulder. Follow these instructions at home:  Take over-the-counter and prescription medicines only as told by your health care provider.  If you are being treated with physical therapy, follow instructions from your physical therapist.  Avoid exercises that put a lot of demand on your shoulder, such as throwing. These exercises can make pain worse.  If directed, apply ice to the injured area: ? Put ice in a plastic bag. ? Place a towel between your skin and the bag. ? Leave the ice on for 20 minutes, 2-3 times per day. Contact a health care provider if:  You develop new symptoms.  Your symptoms get worse. This information is not intended to  replace advice given to you by your health care provider. Make sure you discuss any questions you have with your health care provider. Document Released: 11/13/2008 Document Revised: 06/24/2015 Document Reviewed: 05/11/2014 Elsevier Interactive Patient Education  2018 [employer].   IF you received an x-ray today, you will receive an invoice from [facility]. Please contact [facility] at [phone] with questions or concerns regarding your invoice.   IF you received labwork today, you will receive an invoice from [place]. Please contact [facility] at [phone] with questions or concerns regarding your invoice.   Our billing staff will not be able to assist you with questions regarding bills from these companies.  You will be contacted with the lab results as soon as they are available. The fastest way to get your results is to activate your My Chart account. Instructions are located on the last page of this paperwork. If you have not heard from [place] regarding the results  in 2 weeks, please contact this office.
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
## 2016-12-17 IMAGING — CR DG CERVICAL SPINE COMPLETE 4+V
5 series · 5 of 5 positions shown · non-contrast
Comparison: None

CLINICAL DATA: Left-sided neck pain, no injury

EXAM:
CERVICAL SPINE - COMPLETE 4+ VIEW

[lpo]
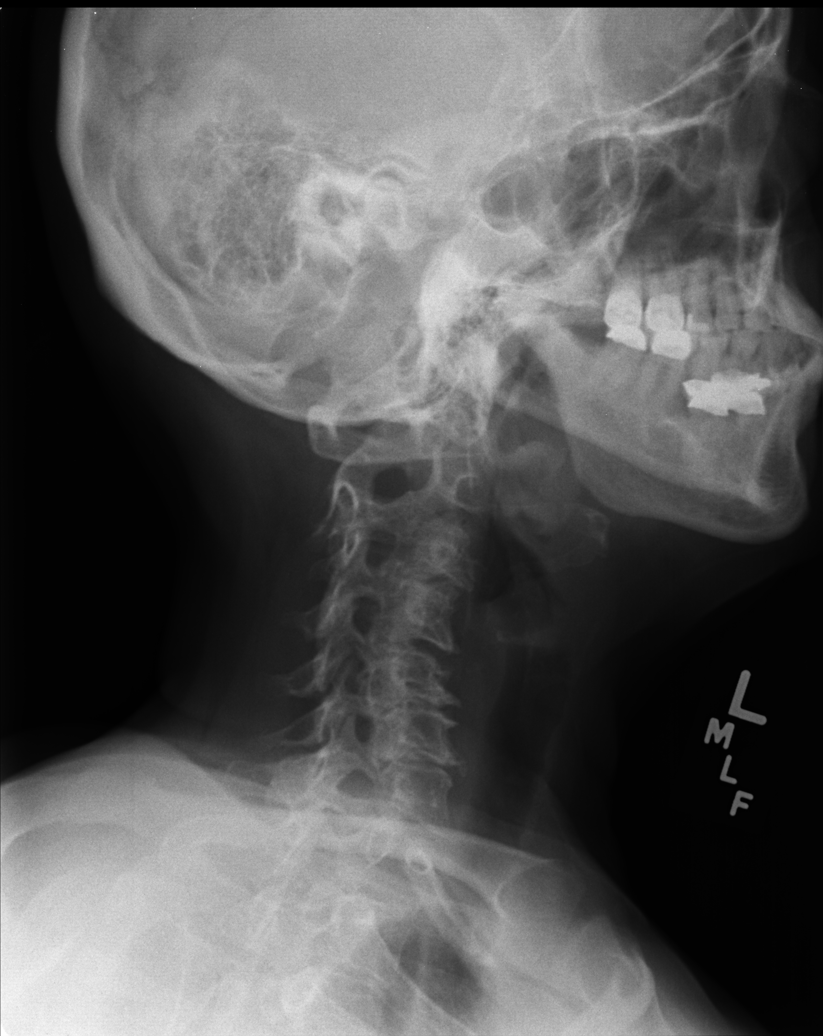

[lateral]
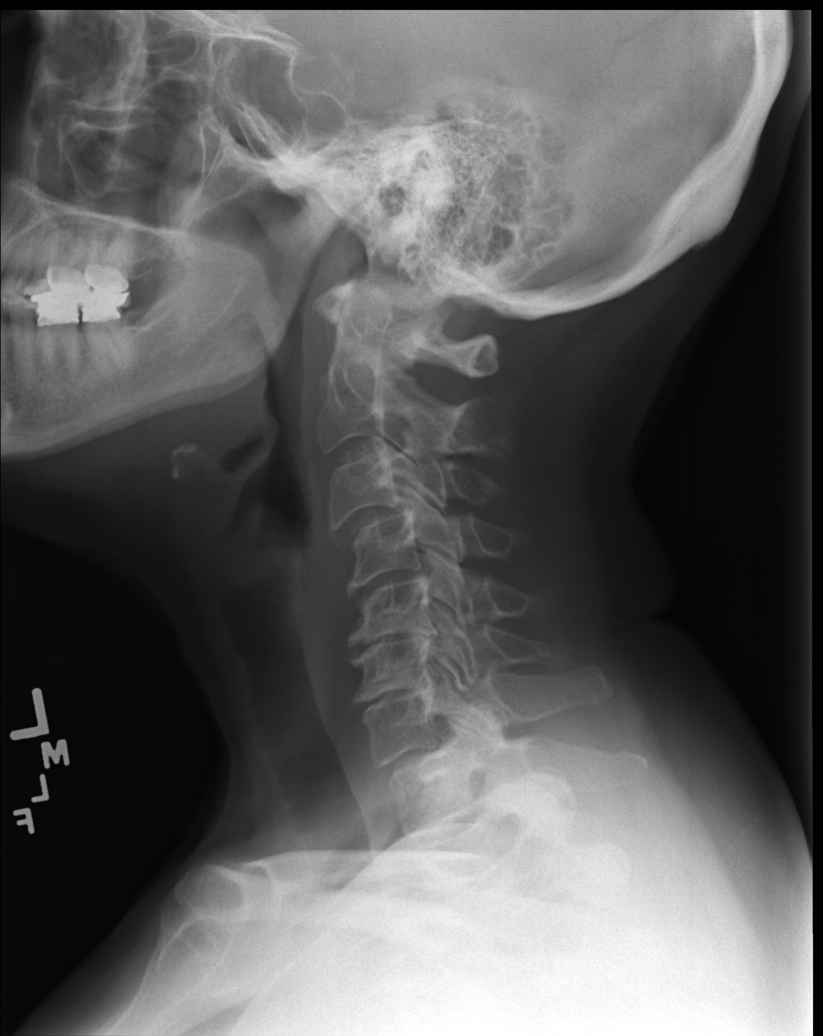

[rpo]
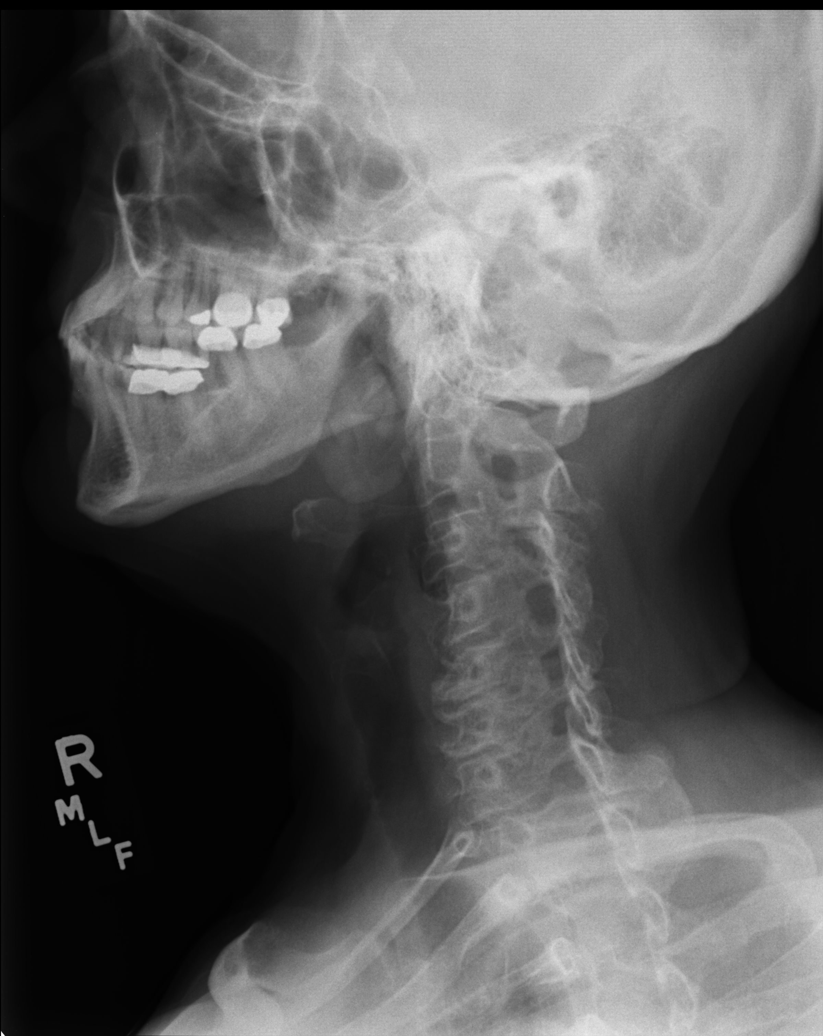

[AP]
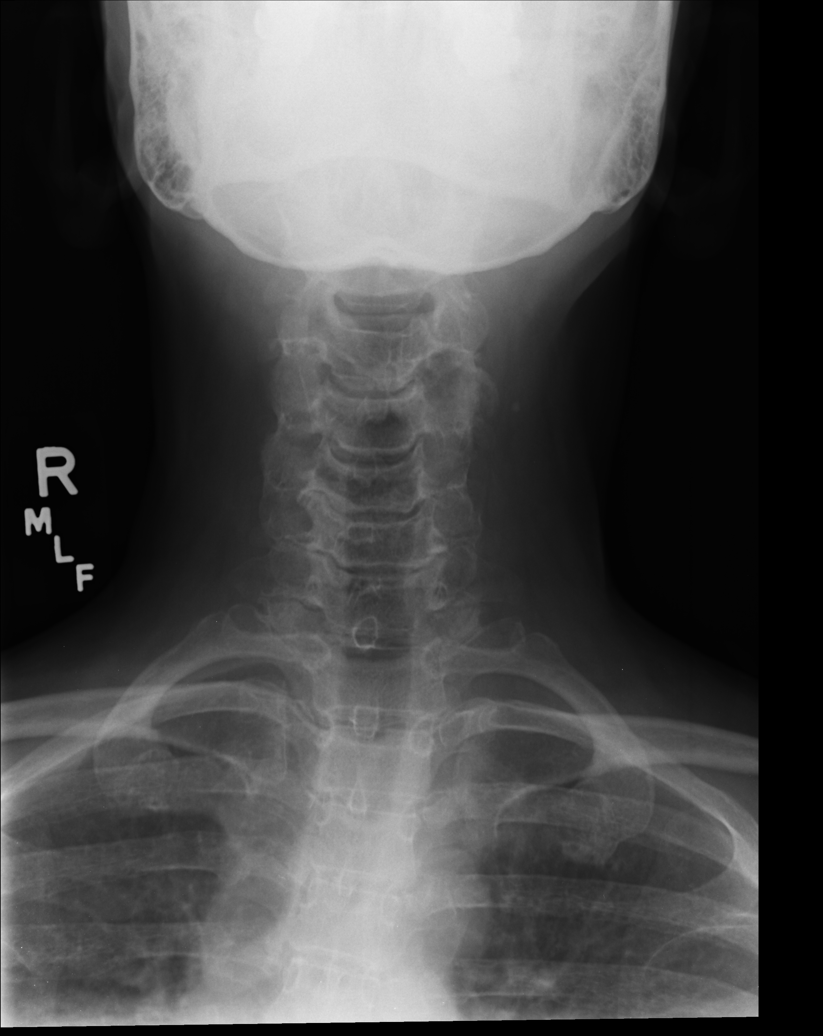

[ap open mouth]
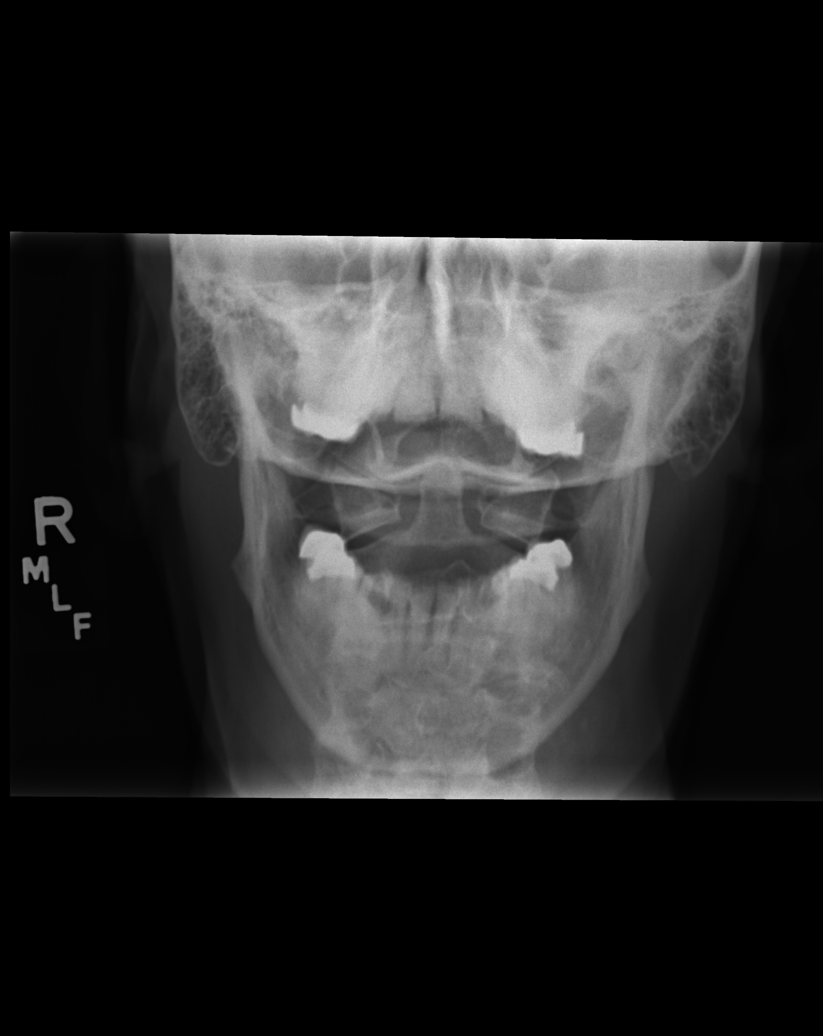

[5 of 5 positions shown; findings below may reference images not displayed]

FINDINGS: The cervical vertebrae are somewhat straightened in alignment. There
is degenerative disc disease at C4-5, C5-6, C6-7, and C7-T1 levels
with loss of disc space and sclerosis with spurring. There is a 3 mm
anterolisthesis of C3 on C4, 3 mm retrolisthesis of C5 on C6, and 3
mm anterolisthesis of C7 on T1 all of which most likely are
degenerative in origin. No prevertebral soft tissue swelling is
seen. On oblique views, there is foraminal narrowing particularly at
C5-6 and to a lesser degree at C6-7 bilaterally. The odontoid
process is intact. The lung apices are clear.
IMPRESSION: 1. Straightened alignment with degenerative disc disease from C4-T1.
2. Slight malalignments as described above most likely degenerative
in origin.
3. Moderate foraminal narrowing bilaterally at C5-6 and C6-7.

## 2016-12-29 ENCOUNTER — Telehealth: Payer: Self-pay | Admitting: Family Medicine

## 2016-12-29 DIAGNOSIS — M25561 Pain in right knee: Secondary | ICD-10-CM

## 2016-12-29 DIAGNOSIS — M25512 Pain in left shoulder: Secondary | ICD-10-CM

## 2016-12-29 NOTE — Telephone Encounter (Signed)
Im happy to refer her. When we talked in office, she had been seen by ortho earlier this year.  Does she want to be referred back to same ortho?

## 2016-12-29 NOTE — Telephone Encounter (Signed)
Called pt to find out if she wanted to go to same ortho doctor. She said the doctor she had previously seen was a Publishing rights manager. She said she would like to go to a doctor in [place] and asked if [facility] had one. I did not find any orthopaedics in [place] with [facility]. The pt asked me to call her back in 10 minutes during our phone call. I called her and had to leave a message. I left vm stating I did not know of any ortho doctors with [facility] in [place], and that we could try to find one outside of [facility] or we could refer her to an ortho doctor in [place]. I asked the pt to call me back with this info or leave a message with the [place] letting me know how she would like to proceed. Thanks.

## 2016-12-29 NOTE — Telephone Encounter (Signed)
Copied from [place]. Topic: Referral - Request >> Dec 28, 2016  4:54 PM Valla Leaver wrote: Reason for CRM: Patient needs orthopaedic referral for shoulder and arm pain. Prefer a location close to [place] if possible. -----------------------------------------------------------------------------------------  Please advise. Thanks!

## 2017-01-08 ENCOUNTER — Ambulatory Visit: Payer: Self-pay | Admitting: *Deleted

## 2017-01-08 NOTE — Telephone Encounter (Signed)
Pt states that she woke up with her left wrist swollen. Could not extend hand, did not injured it. Using ice and elevating it. Which she stated that it did help, along with the Tylenol. Saw a small bruise on the inside of palm.  She is right handed and only notice this, Saturday morning. Did not wish to make an appointment, but wants to go to the office on Wednesday to see if she can be worked in.  Home care advice given to patient.  Reason for Disposition . [1] MILD pain (e.g., does not interfere with normal activities) AND [2] present > 7 days  Answer Assessment - Initial Assessment Questions 1. ONSET: "When did the pain start?"     Saturday morning 2. LOCATION: "Where is the pain located?"     Left wrist 3. PAIN: "How bad is the pain?" (Scale 1-10; or mild, moderate, severe)   - MILD (1-3): doesn't interfere with normal activities   - MODERATE (4-7): interferes with normal activities (e.g., work or school) or awakens from sleep   - SEVERE (8-10): excruciating pain, unable to use hand at all     No pain 4. WORK OR EXERCISE: "Has there been any recent work or exercise that involved this part of the body?"     no 5. CAUSE: "What do you think is causing the pain?"     Not sure 6. AGGRAVATING FACTORS: "What makes the pain worse?" (e.g., using computer)     n/a 7. OTHER SYMPTOMS: "Do you have any other symptoms?" (e.g., neck pain, swelling, rash, numbness, fever)     Could not extend fingers or flatten the hand, swelling, little tingling,bruised on side and discolored area on the palm 8. PREGNANCY: "Is there any chance you are pregnant?" "When was your last menstrual period?"     no  Protocols used: HAND AND WRIST PAIN-A-AH

## 2017-01-10 NOTE — Telephone Encounter (Signed)
Pt will try to find ortho md with [facility] in [place] she will call back

## 2017-01-10 NOTE — Telephone Encounter (Signed)
Copied from [place] #[id number]. Topic: Referral - Request >> Jan 10, 2017  1:14 PM [name], [place] wrote: Reason for CRM: Patient called and would like a referral to see a orthopedic  for her shoulder please call [phone]

## 2017-01-11 ENCOUNTER — Other Ambulatory Visit: Payer: Self-pay

## 2017-01-11 ENCOUNTER — Ambulatory Visit: Payer: BC Managed Care – PPO | Admitting: Physician Assistant

## 2017-01-11 ENCOUNTER — Ambulatory Visit (INDEPENDENT_AMBULATORY_CARE_PROVIDER_SITE_OTHER): Payer: BC Managed Care – PPO

## 2017-01-11 ENCOUNTER — Encounter: Payer: Self-pay | Admitting: Physician Assistant

## 2017-01-11 VITALS — BP 128/88 | HR 100 | Temp 97.8°F | Resp 16 | Ht 61.5 in | Wt 176.0 lb

## 2017-01-11 DIAGNOSIS — M79632 Pain in left forearm: Secondary | ICD-10-CM | POA: Diagnosis not present

## 2017-01-11 DIAGNOSIS — L819 Disorder of pigmentation, unspecified: Secondary | ICD-10-CM | POA: Diagnosis not present

## 2017-01-11 NOTE — Progress Notes (Signed)
PRIMARY CARE AT [facility] [address], [place] [address] [phone]  Date:  01/11/2017   Name:  [name]   DOB:  07-Dec-1950   MRN:  [id number]  PCP:  [name], MD    History of Present Illness:  [name] is a 66 y.o. female patient who presents to PCP with  Chief Complaint  Patient presents with  . Hand Pain    bruise in hand and on wrist/ x 2 days  . Wrist Pain    5  days ago, hand pain of both hands.  brusing at three places of her hand.  At her thuymb, index finger, and  Forearm is mildy painful of the left forearm.  She has not had any trauma, extensive use.  She could not bend her hands.   She was able to perform housework.  She notes swollen of her forearm.   She generally sleeps in a couch to avoid weight on her left shoulder.     Patient Active Problem List   Diagnosis Date Noted  . Sinusitis, chronic   . Asthma 01/08/2012  . Dyslipidemia   . ADD (attention deficit disorder)   . Depression   . Osteopenia   . Incisional hernia   . Stress incontinence, female     Past Medical History:  Diagnosis Date  . ADD (attention deficit disorder)   . Arthritis    "bunion on  right" (08/11/2014)  . Chronic bronchitis ([place])    "plenty; but not q yr" (08/11/2014)  . Depression   . Dyslipidemia   . Environmental allergies   . GERD (gastroesophageal reflux disease)   . Hearing difficulty of left ear    "grew up w/deaf parent; find myself lip reading more recently" (08/11/2014)  . Hepatitis    "exposed in college; rec'd gamma globulin; 6 months later S/S (weakness, lethargy, fevers); ; dx'd infectious hepatitis"  . History of herpes genitalis    "stress induced reoccurance that shows up on my left middle finger" (08/11/2014)  . History of hiatal hernia   . History of stomach ulcers   . Hypercholesterolemia   . Hyperthyroidism 1994   "postpartum only; resolved itself"  . Incisional hernia   . Internal hemorrhoids   . Leg cramping   . Osteopenia   .  Seasonal asthma   . Stress incontinence, female   . Ulcer     Past Surgical History:  Procedure Laterality Date  . CESAREAN SECTION  1994  . CHALAZION EXCISION Left ~ 2013  . DILATION AND CURETTAGE OF UTERUS  X 2  . EYE SURGERY    . HERNIA REPAIR  08/11/2014  . INCISIONAL HERNIA REPAIR N/A 08/11/2014   Procedure: LAPAROSCOPIC INCISIONAL HERNIA REPAIR;  Surgeon: Coralie Keens, MD;  Location: [place];  Service: General;  Laterality: N/A;  . INCONTINENCE SURGERY  2005  . INGUINAL HERNIA REPAIR Left 08/11/2014  . INGUINAL HERNIA REPAIR Left 08/11/2014   Procedure: LEFT INGUINAL HERNIA REPAIR;  Surgeon: Coralie Keens, MD;  Location: [place];  Service: General;  Laterality: Left;  . INSERTION OF MESH Left 08/11/2014   Procedure: INSERTION OF MESH TO LEFT GROIN AND ABDOMEN;  Surgeon: Coralie Keens, MD;  Location: [place];  Service: General;  Laterality: Left;  . LAPAROSCOPIC INCISIONAL / UMBILICAL / VENTRAL HERNIA REPAIR  08/11/2014   [facility]  . NASAL POLYP EXCISION  2014  . REPAIR OF PERFORATED ULCER  1985  . TONSILLECTOMY      Social History  Tobacco Use  . Smoking status: Never Smoker  . Smokeless tobacco: Never Used  Substance Use Topics  . Alcohol use: Yes    Alcohol/week: 1.2 oz    Types: 1 Glasses of wine, 1 Shots of liquor per week    Comment: 08/11/2014 "0-2 glasses of wine or a mixed drink q wk"  . Drug use: No    Family History  Problem Relation Age of Onset  . Arthritis Mother   . Heart disease Mother        CABG @ 52  . Diabetes Mother   . Osteoporosis Mother   . Hyperlipidemia Mother   . Arthritis Father   . Lymphoma Father 82       chemo  . Cancer Father        lymphoma  . Lymphoma Paternal Grandmother   . Diabetes Maternal Grandmother     Allergies  Allergen Reactions  . Aspirin     Other reaction(s): GI Intolerance History of ulcers  . Bee Venom     Any insects that bite or stings  . Nsaids Other (See Comments)    History of ulcers    Medication  list has been reviewed and updated.  Current Outpatient Medications on File Prior to Visit  Medication Sig Dispense Refill  . acetaminophen (TYLENOL) 650 MG CR tablet Take 1,300 mg by mouth every 8 (eight) hours as needed for pain.    Marland Kitchen amphetamine-dextroamphetamine (ADDERALL) 20 MG tablet Take 1 tablet bid-tid for ADD 180 tablet 0  . cyclobenzaprine (FLEXERIL) 10 MG tablet Take 1 tablet (10 mg total) by mouth 2 (two) times daily as needed for muscle spasms. 90 tablet 1  . DULoxetine (CYMBALTA) 60 MG capsule Take 1 capsule (60 mg total) by mouth daily. 90 capsule 1  . ipratropium (ATROVENT) 0.03 % nasal spray PLACE 2 SPRAYS INTO THE NOSE 4 (FOUR) TIMES DAILY. USE AS NEEDED FOR RUNNY NOSE AND CONGESTION 30 mL 1  . loratadine (CLARITIN) 10 MG tablet Take 1 tablet (10 mg total) by mouth daily. 90 tablet 3  . Magnesium 500 MG CAPS Take by mouth.    . mometasone (NASONEX) 50 MCG/ACT nasal spray INHALE 2 SPRAYS INTO THE NOSE DAILY AS NEEDED 17 g 5  . PROAIR HFA 108 (90 Base) MCG/ACT inhaler INHALE 2 PUFFS INTO THE LUNGS EVERY 6 (SIX) HOURS AS NEEDED *EMERGENCY FILL* 8.5 Inhaler 0  . simvastatin (ZOCOR) 40 MG tablet TAKE 1 TABLET DAILY 90 tablet 2   No current facility-administered medications on file prior to visit.     ROS ROS otherwise unremarkable unless listed above.  Physical Examination: BP 128/88   Pulse 100   Temp 97.8 F (36.6 C) (Oral)   Resp 16   Ht 5' 1.5" (1.562 m)   Wt 176 lb (79.8 kg)   LMP 08/30/2008   SpO2 98%   BMI 32.72 kg/m  Ideal Body Weight: Weight in (lb) to have BMI = 25: 134.2  Physical Exam  Constitutional: She is oriented to person, place, and time. She appears well-developed and well-nourished. No distress.  HENT:  Head: Normocephalic and atraumatic.  Right Ear: External ear normal.  Left Ear: External ear normal.  Eyes: Conjunctivae and EOM are normal. Pupils are equal, round, and reactive to light.  Cardiovascular: Normal rate.  Pulmonary/Chest:  Effort normal. No respiratory distress.  Neurological: She is alert and oriented to person, place, and time.  Skin: She is not diaphoretic.  Palms and wrist do not appear  to have any erythema or ecchymosis.  Normal grip strength bilaterally.  Normal radial pulses.  Psychiatric: She has a normal mood and affect. Her behavior is normal.     Dg Forearm Left  Result Date: 01/11/2017 CLINICAL DATA:  66 year old female with left forearm and hand pain and bruising. EXAM: LEFT FOREARM - 2 VIEW COMPARISON:  None. FINDINGS: Bone mineralization is within normal limits for age. There is no evidence of fracture or other focal bone lesions. Alignment at the left elbow and wrist appears preserved. Ulna minus variant (normal variant). No radiographic soft tissue abnormality. IMPRESSION: Negative. Electronically Signed   By: Genevie Ann M.D.   On: 01/11/2017 14:50   Dg Hand Complete Left  Result Date: 01/11/2017 CLINICAL DATA:  66 year old female with left forearm and hand pain and bruising. EXAM: LEFT HAND - COMPLETE 3+ VIEW COMPARISON:  Left forearm series today reported separately. FINDINGS: Bone mineralization is within normal limits for age. Distal left radius and ulna appear intact. Carpal bone alignment and joint spaces within normal limits. 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 Metacarpals and phalanges appear intact. Soft tissues within normal limits. IMPRESSION: Negative for age radiographic appearance of the left hand. Electronically Signed   By: Genevie Ann M.D.   On: 01/11/2017 14:51     Assessment and Plan: [name] is a 66 y.o. female who is here today for cc of  Chief Complaint  Patient presents with  . Hand Pain    bruise in hand and on wrist/ x 2 days  . Wrist Pain  hands and wrist are not yielding much in terms of ecchymosis or appearance.  Advised of alarming symptoms to warrant an immediate return.   Left forearm pain - Plan: DG Forearm Left,  Sedimentation Rate  Discoloration of skin of hand - Plan: DG Forearm Left, DG Hand Complete Left, Sedimentation Rate  Ivar Drape, PA-C [facility] 01/24/2017

## 2017-01-11 NOTE — Telephone Encounter (Signed)
Pt came in and spoke to [place] regarding referral. She stated she was fine with going to Ortho in [place]. Please place referral and I will send! Thanks!

## 2017-01-11 NOTE — Patient Instructions (Addendum)
I will let you know what the lab results reveal.  For now you can ice the wrist and forearm as needed.  You can take tylenol if you are able to do so.      IF you received an x-ray today, you will receive an invoice from [facility]. Please contact [facility] at [phone] with questions or concerns regarding your invoice.   IF you received labwork today, you will receive an invoice from [facility]. Please contact [facility] at [phone] with questions or concerns regarding your invoice.   Our billing staff will not be able to assist you with questions regarding bills from these companies.  You will be contacted with the lab results as soon as they are available. The fastest way to get your results is to activate your My Chart account. Instructions are located on the last page of this paperwork. If you have not heard from [place] regarding the results in 2 weeks, please contact this office.

## 2017-01-12 ENCOUNTER — Ambulatory Visit: Payer: BC Managed Care – PPO | Admitting: Urgent Care

## 2017-01-12 LAB — SEDIMENTATION RATE: SED RATE: 17 mm/h (ref 0–40)

## 2017-01-13 NOTE — Telephone Encounter (Signed)
Referred. I did refer her for both her shoulder and knee, but if knee has improved, can just discuss shoulder.

## 2017-02-09 NOTE — Progress Notes (Signed)
Called pt, left no vm due to no consent form found to do so.

## 2017-03-06 ENCOUNTER — Other Ambulatory Visit: Payer: Self-pay | Admitting: Family Medicine

## 2017-03-06 ENCOUNTER — Other Ambulatory Visit: Payer: Self-pay | Admitting: Urgent Care

## 2017-03-13 ENCOUNTER — Other Ambulatory Visit: Payer: Self-pay | Admitting: Orthopedic Surgery

## 2017-03-13 DIAGNOSIS — M25512 Pain in left shoulder: Secondary | ICD-10-CM

## 2017-04-02 ENCOUNTER — Ambulatory Visit
Admission: RE | Admit: 2017-04-02 | Discharge: 2017-04-02 | Disposition: A | Discharge status: Home / Self Care | Payer: BC Managed Care – PPO | Source: Ambulatory Visit | Attending: Orthopedic Surgery | Admitting: Orthopedic Surgery

## 2017-04-02 DIAGNOSIS — M25512 Pain in left shoulder: Secondary | ICD-10-CM

## 2017-05-01 ENCOUNTER — Encounter: Payer: Self-pay | Admitting: Physician Assistant

## 2017-05-28 ENCOUNTER — Other Ambulatory Visit: Payer: Self-pay

## 2017-05-28 ENCOUNTER — Encounter: Payer: Self-pay | Admitting: Family Medicine

## 2017-05-28 ENCOUNTER — Ambulatory Visit: Payer: BC Managed Care – PPO | Admitting: Family Medicine

## 2017-05-28 VITALS — BP 118/78 | HR 101 | Temp 98.0°F | Resp 16 | Ht 62.99 in | Wt 178.0 lb

## 2017-05-28 DIAGNOSIS — Z23 Encounter for immunization: Secondary | ICD-10-CM

## 2017-05-28 DIAGNOSIS — R1013 Epigastric pain: Secondary | ICD-10-CM | POA: Diagnosis not present

## 2017-05-28 DIAGNOSIS — K449 Diaphragmatic hernia without obstruction or gangrene: Secondary | ICD-10-CM

## 2017-05-28 DIAGNOSIS — E785 Hyperlipidemia, unspecified: Secondary | ICD-10-CM

## 2017-05-28 DIAGNOSIS — M19012 Primary osteoarthritis, left shoulder: Secondary | ICD-10-CM | POA: Diagnosis not present

## 2017-05-28 DIAGNOSIS — E7439 Other disorders of intestinal carbohydrate absorption: Secondary | ICD-10-CM | POA: Diagnosis not present

## 2017-05-28 DIAGNOSIS — M170 Bilateral primary osteoarthritis of knee: Secondary | ICD-10-CM | POA: Diagnosis not present

## 2017-05-28 DIAGNOSIS — N3941 Urge incontinence: Secondary | ICD-10-CM | POA: Diagnosis not present

## 2017-05-28 DIAGNOSIS — R05 Cough: Secondary | ICD-10-CM

## 2017-05-28 DIAGNOSIS — Z8711 Personal history of peptic ulcer disease: Secondary | ICD-10-CM | POA: Diagnosis not present

## 2017-05-28 DIAGNOSIS — R059 Cough, unspecified: Secondary | ICD-10-CM

## 2017-05-28 LAB — POCT URINALYSIS DIP (MANUAL ENTRY)
Bilirubin, UA: NEGATIVE
Blood, UA: NEGATIVE
GLUCOSE UA: NEGATIVE mg/dL
Ketones, POC UA: NEGATIVE mg/dL
Leukocytes, UA: NEGATIVE
NITRITE UA: NEGATIVE
PROTEIN UA: NEGATIVE mg/dL
Spec Grav, UA: 1.025 (ref 1.010–1.025)
UROBILINOGEN UA: 0.2 U/dL
pH, UA: 5.5 (ref 5.0–8.0)

## 2017-05-28 MED ORDER — AMPHETAMINE-DEXTROAMPHETAMINE 20 MG PO TABS: 20.0000 mg | ORAL_TABLET | Freq: Two times a day (BID) | ORAL | 0 refills | Status: DC

## 2017-05-28 MED ORDER — DULOXETINE HCL 60 MG PO CPEP: 60.0000 mg | ORAL_CAPSULE | Freq: Every day | ORAL | 1 refills | Status: DC

## 2017-05-28 MED ORDER — DULOXETINE HCL 30 MG PO CPEP: 30.0000 mg | ORAL_CAPSULE | Freq: Every day | ORAL | 3 refills | Status: DC

## 2017-05-28 MED ORDER — ESOMEPRAZOLE MAGNESIUM 40 MG PO CPDR: 40.0000 mg | DELAYED_RELEASE_CAPSULE | Freq: Every day | ORAL | 1 refills | Status: DC

## 2017-05-28 MED ORDER — ALBUTEROL SULFATE HFA 108 (90 BASE) MCG/ACT IN AERS: INHALATION_SPRAY | RESPIRATORY_TRACT | 3 refills | Status: DC

## 2017-05-28 MED ORDER — SIMVASTATIN 40 MG PO TABS: 40.0000 mg | ORAL_TABLET | Freq: Every day | ORAL | 1 refills | Status: DC

## 2017-05-28 NOTE — Patient Instructions (Addendum)
   IF you received an x-ray today, you will receive an invoice from [facility]. Please contact [facility] at [phone] with questions or concerns regarding your invoice.   IF you received labwork today, you will receive an invoice from [facility]. Please contact [facility] at [phone] with questions or concerns regarding your invoice.   Our billing staff will not be able to assist you with questions regarding bills from these companies.  You will be contacted with the lab results as soon as they are available. The fastest way to get your results is to activate your My Chart account. Instructions are located on the last page of this paperwork. If you have not heard from us regarding the results in 2 weeks, please contact this office.       Hiatal Hernia A hiatal hernia occurs when part of the stomach slides above the muscle that separates the abdomen from the chest (diaphragm). A person can be born with a hiatal hernia (congenital), or it may develop over time. In almost all cases of hiatal hernia, only the top part of the stomach pushes through the diaphragm. Many people have a hiatal hernia with no symptoms. The larger the hernia, the more likely it is that you will have symptoms. In some cases, a hiatal hernia allows stomach acid to flow back into the tube that carries food from your mouth to your stomach (esophagus). This may cause heartburn symptoms. Severe heartburn symptoms may mean that you have developed a condition called gastroesophageal reflux disease (GERD). What are the causes? This condition is caused by a weakness in the opening (hiatus) where the esophagus passes through the diaphragm to attach to the upper part of the stomach. A person may be born with a weakness in the hiatus, or a weakness can develop over time. What increases the risk? This condition is more likely to develop in:  Older people. Age is a major risk factor for a hiatal hernia,  especially if you are over the age of 50.  Pregnant women.  People who are overweight.  People who have frequent constipation.  What are the signs or symptoms? Symptoms of this condition usually develop in the form of GERD symptoms. Symptoms include:  Heartburn.  Belching.  Indigestion.  Trouble swallowing.  Coughing or wheezing.  Sore throat.  Hoarseness.  Chest pain.  Nausea and vomiting.  How is this diagnosed? This condition may be diagnosed during testing for GERD. Tests that may be done include:  X-rays of your stomach or chest.  An upper gastrointestinal (GI) series. This is an X-ray exam of your GI tract that is taken after you swallow a chalky liquid that shows up clearly on the X-ray.  Endoscopy. This is a procedure to look into your stomach using a thin, flexible tube that has a tiny camera and light on the end of it.  How is this treated? This condition may be treated by:  Dietary and lifestyle changes to help reduce GERD symptoms.  Medicines. These may include: ? Over-the-counter antacids. ? Medicines that make your stomach empty more quickly. ? Medicines that block the production of stomach acid (H2 blockers). ? Stronger medicines to reduce stomach acid (proton pump inhibitors).  Surgery to repair the hernia, if other treatments are not helping.  If you have no symptoms, you may not need treatment. Follow these instructions at home: Lifestyle and activity  Do not use any products that contain nicotine or tobacco, such as cigarettes and e-cigarettes. If   you need help quitting, ask your health care provider.  Try to achieve and maintain a healthy body weight.  Avoid putting pressure on your abdomen. Anything that puts pressure on your abdomen increases the amount of acid that may be pushed up into your esophagus. ? Avoid bending over, especially after eating. ? Raise the head of your bed by putting blocks under the legs. This keeps your head  and esophagus higher than your stomach. ? Do not wear tight clothing around your chest or stomach. ? Try not to strain when having a bowel movement, when urinating, or when lifting heavy objects. Eating and drinking  Avoid foods that can worsen GERD symptoms. These may include: ? Fatty foods, like fried foods. ? Citrus fruits, like oranges or lemon. ? Other foods and drinks that contain acid, like orange juice or tomatoes. ? Spicy food. ? Chocolate.  Eat frequent small meals instead of three large meals a day. This helps prevent your stomach from getting too full. ? Eat slowly. ? Do not lie down right after eating. ? Do not eat 1-2 hours before bed.  Do not drink beverages with caffeine. These include cola, coffee, cocoa, and tea.  Do not drink alcohol. General instructions  Take over-the-counter and prescription medicines only as told by your health care provider.  Keep all follow-up visits as told by your health care provider. This is important. Contact a health care provider if:  Your symptoms are not controlled with medicines or lifestyle changes.  You are having trouble swallowing.  You have coughing or wheezing that will not go away. Get help right away if:  Your pain is getting worse.  Your pain spreads to your arms, neck, jaw, teeth, or back.  You have shortness of breath.  You sweat for no reason.  You feel sick to your stomach (nauseous) or you vomit.  You vomit blood.  You have bright red blood in your stools.  You have black, tarry stools. This information is not intended to replace advice given to you by your health care provider. Make sure you discuss any questions you have with your health care provider. Document Released: 04/08/2003 Document Revised: 01/10/2016 Document Reviewed: 01/10/2016 Elsevier Interactive Patient Education  2018 [employer]  

## 2017-05-28 NOTE — Progress Notes (Signed)
Subjective:    Patient ID: [name], female    DOB: Dec 25, 1950, 67 y.o.   MRN: [id number]  05/28/2017  Abdominal Pain (pt states she has been having pain since Thursday. Pt states it feels like a burning feeling around her navel. )    HPI This 67 y.o. female presents for evaluation of ABDOMINAL BURNING for one week.  S/p CT shoulder; reported hiatal hernia.  Four to six weeks ago, breathing and heard gurgling sounds not in chest so likely in abdomen.  Two weeks ago, developed a R groin or hip pinch; no gas; weird pinch; self resolved. Then started taking MSM supplement; occurred that this is a new supplement.  Has taken everything else like Magnesium for a while.  Stopped MSM.  History of perforated ulcer.  Still having pain.  When hurts, wants to bend over. Belching and passing flatus does not relieve symptoms.  No fever/chills/sweats.  +fatigue recently.  Rough couple of weeks at school.  No nausea, vomiting, diarrhea, constipation. With previous ulcer perforation, prescribed Nexium yet perforated afterwards.    Severe shoulder osteoarthritis: needs shoulder replacement.  Avoids all NSAIDs; does take Tylenol for pain.    B knee osteoarthritis: s/p injection last week.  Giving out.  Placed in brace RIGHT.  Worsening pain since injection; going for round two .   Using Cymbalta 60mg  every morning.  Requesting 30mg  for evening.  No longer needs it for depression or anxiety.  Still taking cyclobenzaprine for LEFT shoulder osteoarthritis.  Causes sedation.   Urinary incontinence: serious bladder incontinence.  On the way to bathroom, loses control.  Large pads.  No kegel power.  S/p sling procedure.     BP Readings from Last 3 Encounters:  05/28/17 118/78  01/11/17 128/88  12/09/16 130/68   Wt Readings from Last 3 Encounters:  05/28/17 178 lb (80.7 kg)  01/11/17 176 lb (79.8 kg)  12/09/16 176 lb (79.8 kg)   Immunization History  Administered Date(s) Administered  . Influenza Inj  Mdck Quad With Preservative 10/24/2016  . Influenza Split 12/01/2010  . Influenza,inj,Quad PF,6+ Mos 11/29/2015  . Influenza-Unspecified 09/30/2012, 10/08/2013, 10/31/2014  . Pneumococcal Conjugate-13 11/29/2015  . Tdap 12/30/2009  . Zoster 09/30/2012    Review of Systems  Constitutional: Negative for chills, diaphoresis, fatigue and fever.  Eyes: Negative for visual disturbance.  Respiratory: Negative for cough and shortness of breath.   Cardiovascular: Negative for chest pain, palpitations and leg swelling.  Gastrointestinal: Positive for abdominal pain. Negative for abdominal distention, anal bleeding, blood in stool, constipation, diarrhea, nausea, rectal pain and vomiting.  Endocrine: Negative for cold intolerance, heat intolerance, polydipsia, polyphagia and polyuria.  Genitourinary: Positive for difficulty urinating and urgency. Negative for decreased urine volume, dyspareunia, dysuria, enuresis, flank pain, frequency, genital sores, hematuria, pelvic pain, vaginal bleeding, vaginal discharge and vaginal pain.  Musculoskeletal: Positive for arthralgias.  Neurological: Negative for dizziness, tremors, seizures, syncope, facial asymmetry, speech difficulty, weakness, light-headedness, numbness and headaches.    Past Medical History:  Diagnosis Date  . ADD (attention deficit disorder)   . Arthritis    "bunion on  right" (08/11/2014)  . Chronic bronchitis ([place])    "plenty; but not q yr" (08/11/2014)  . Depression   . Dyslipidemia   . Environmental allergies   . GERD (gastroesophageal reflux disease)   . Hearing difficulty of left ear    "grew up w/deaf parent; find myself lip reading more recently" (08/11/2014)  . Hepatitis    "exposed in college; rec'd  gamma globulin; 6 months later S/S (weakness, lethargy, fevers); ; dx'd infectious hepatitis"  . History of herpes genitalis    "stress induced reoccurance that shows up on my left middle finger" (08/11/2014)  . History of hiatal  hernia   . History of stomach ulcers   . Hypercholesterolemia   . Hyperthyroidism 1994   "postpartum only; resolved itself"  . Incisional hernia   . Internal hemorrhoids   . Leg cramping   . Osteopenia   . Seasonal asthma   . Stress incontinence, female   . Ulcer    Past Surgical History:  Procedure Laterality Date  . CESAREAN SECTION  1994  . CHALAZION EXCISION Left ~ 2013  . DILATION AND CURETTAGE OF UTERUS  X 2  . EYE SURGERY    . HERNIA REPAIR  08/11/2014  . INCISIONAL HERNIA REPAIR N/A 08/11/2014   Procedure: LAPAROSCOPIC INCISIONAL HERNIA REPAIR;  Surgeon: Coralie Keens, MD;  Location: [facility];  Service: General;  Laterality: N/A;  . INCONTINENCE SURGERY  2005  . INGUINAL HERNIA REPAIR Left 08/11/2014  . INGUINAL HERNIA REPAIR Left 08/11/2014   Procedure: LEFT INGUINAL HERNIA REPAIR;  Surgeon: Coralie Keens, MD;  Location: [facility];  Service: General;  Laterality: Left;  . INSERTION OF MESH Left 08/11/2014   Procedure: INSERTION OF MESH TO LEFT GROIN AND ABDOMEN;  Surgeon: Coralie Keens, MD;  Location: [facility];  Service: General;  Laterality: Left;  . LAPAROSCOPIC INCISIONAL / UMBILICAL / VENTRAL HERNIA REPAIR  08/11/2014   [facility]  . NASAL POLYP EXCISION  2014  . REPAIR OF PERFORATED ULCER  1985  . TONSILLECTOMY     Allergies  Allergen Reactions  . Aspirin     Other reaction(s): GI Intolerance History of ulcers  . Bee Venom     Any insects that bite or stings  . Nsaids Other (See Comments)    History of ulcers   Current Outpatient Medications on File Prior to Visit  Medication Sig Dispense Refill  . acetaminophen (TYLENOL) 650 MG CR tablet Take 1,300 mg by mouth every 8 (eight) hours as needed for pain.    . cyclobenzaprine (FLEXERIL) 10 MG tablet Take 1 tablet (10 mg total) by mouth 2 (two) times daily as needed for muscle spasms. 90 tablet 1  . ipratropium (ATROVENT) 0.03 % nasal spray PLACE 2 SPRAYS INTO THE NOSE 4 (FOUR) TIMES DAILY. USE AS NEEDED FOR RUNNY NOSE AND  CONGESTION 30 mL 1  . loratadine (CLARITIN) 10 MG tablet Take 1 tablet (10 mg total) by mouth daily. 90 tablet 3  . Magnesium 500 MG CAPS Take by mouth.    . mometasone (NASONEX) 50 MCG/ACT nasal spray INHALE 2 SPRAYS INTO THE NOSE DAILY AS NEEDED 17 g 5  . PROAIR HFA 108 (90 Base) MCG/ACT inhaler INHALE 2 PUFFS INTO THE LUNGS EVERY 6 (SIX) HOURS AS NEEDED *EMERGENCY FILL* 8.5 Inhaler 0   No current facility-administered medications on file prior to visit.    Social History   Socioeconomic History  . Marital status: Divorced    Spouse name: Not on file  . Number of children: Not on file  . Years of education: Not on file  . Highest education level: Not on file  Occupational History  . Occupation: Consulting civil engineer    Comment: Full time; Barista  Social Needs  . Financial resource strain: Not on file  . Food insecurity:    Worry: Not on file    Inability: Not on file  .  Transportation needs:    Medical: Not on file    Non-medical: Not on file  Tobacco Use  . Smoking status: Never Smoker  . Smokeless tobacco: Never Used  Substance and Sexual Activity  . Alcohol use: Yes    Alcohol/week: 1.2 oz    Types: 1 Glasses of wine, 1 Shots of liquor per week    Comment: 08/11/2014 "0-2 glasses of wine or a mixed drink q wk"  . Drug use: No  . Sexual activity: Never    Birth control/protection: Post-menopausal  Lifestyle  . Physical activity:    Days per week: Not on file    Minutes per session: Not on file  . Stress: Not on file  Relationships  . Social connections:    Talks on phone: Not on file    Gets together: Not on file    Attends religious service: Not on file    Active member of club or organization: Not on file    Attends meetings of clubs or organizations: Not on file    Relationship status: Not on file  . Intimate partner violence:    Fear of current or ex partner: Not on file    Emotionally abused: Not on file    Physically abused: Not on  file    Forced sexual activity: Not on file  Other Topics Concern  . Not on file  Social History Narrative   Marital status: divorced; not dating      Children: 1 daughter; no grandchildren      Lives:        Employment: Optometrist      Tobacco:       Alcohol:      Drugs:       Exercise:    Family History  Problem Relation Age of Onset  . Arthritis Mother   . Heart disease Mother        CABG @ 32  . Diabetes Mother   . Osteoporosis Mother   . Hyperlipidemia Mother   . Arthritis Father   . Lymphoma Father 67       chemo  . Cancer Father        lymphoma  . Lymphoma Paternal Grandmother   . Diabetes Maternal Grandmother        Objective:    BP 118/78   Pulse (!) 101   Temp 98 F (36.7 C) (Oral)   Resp 16   Ht 5' 2.99" (1.6 m)   Wt 178 lb (80.7 kg)   LMP 08/30/2008   SpO2 96%   BMI 31.54 kg/m  Physical Exam  Constitutional: She is oriented to person, place, and time. She appears well-developed and well-nourished. No distress.  HENT:  Head: Normocephalic and atraumatic.  Right Ear: External ear normal.  Left Ear: External ear normal.  Nose: Nose normal.  Mouth/Throat: Oropharynx is clear and moist.  Eyes: Pupils are equal, round, and reactive to light. Conjunctivae and EOM are normal.  Neck: Normal range of motion. Neck supple. Carotid bruit is not present. No thyromegaly present.  Cardiovascular: Normal rate, regular rhythm, normal heart sounds and intact distal pulses. Exam reveals no gallop and no friction rub.  No murmur heard. Pulmonary/Chest: Effort normal and breath sounds normal. She has no wheezes. She has no rales.  Abdominal: Soft. Normal appearance and bowel sounds are normal. She exhibits no distension and no mass. There is no hepatosplenomegaly. There is tenderness in the epigastric area. There is no rebound, no guarding, no  tenderness at McBurney's point and negative Murphy's sign. No hernia.  Lymphadenopathy:    She has no cervical  adenopathy.  Neurological: She is alert and oriented to person, place, and time. No cranial nerve deficit.  Skin: Skin is warm and dry. No rash noted. She is not diaphoretic. No erythema. No pallor.  Psychiatric: She has a normal mood and affect. Her behavior is normal.   No results found. Depression screen [facility] 2/9 05/28/2017 01/11/2017 12/09/2016 12/09/2016 08/04/2016  Decreased Interest 0 0 0 0 0  Down, Depressed, Hopeless 0 0 0 0 0  PHQ - 2 Score 0 0 0 0 0   Fall Risk  05/28/2017 01/11/2017 12/09/2016 12/09/2016 08/04/2016  Falls in the past year? No No No No Yes  Number falls in past yr: - - - - 2 or more  Injury with Fall? - - - - No        Assessment & Plan:   1. Epigastric pain   2. Hiatal hernia   3. History of peptic ulcer   4. Urge incontinence of urine   5. Dyslipidemia   6. Glucose intolerance   7. Need for prophylactic vaccination against Streptococcus pneumoniae (pneumococcus)     Epigastric pain: New onset; history of HH and PUD; obtain H. Pylori breath test; obtian labs.  Treat empirically with Nexium; dietary modification encouraged.  Urge incontinence: New; send u/a with cx.   If negative, initiate medication and/or refer to urology.  Consider physical therapy.    Dyslipidemia: controlled; obtain labs.   I recommend weight loss, exercise, and low-cholesterol low-fat food choices.  I recommend limiting red meat to once per week; I recommend limiting fried foods to once per month.  Glucose intolerance: stable; obtain labs.  I recommend weight loss, exercise, low-carbohydrate food choices.  Anxiety/depression/osteoarthritis shoulders and knees: add 30mg  Cymbalta; continue Cymbalta 60mg  daily.  ADHD: stable; refill provided.  S/p Pneumovax  Orders Placed This Encounter  Procedures  . Urine Culture    Order Specific Question:   Source    Answer:   clean catch  . Pneumococcal polysaccharide vaccine 23-valent greater than or equal to 2yo subcutaneous/IM  . CBC  with Differential/Platelet  . Comprehensive metabolic panel    Order Specific Question:   Has the patient fasted?    Answer:   No  . Hemoglobin A1c  . Lipid panel    Order Specific Question:   Has the patient fasted?    Answer:   No  . TSH  . H. pylori breath test  . POCT urinalysis dipstick   Meds ordered this encounter  Medications  . esomeprazole (NEXIUM) 40 MG capsule    Sig: Take 1 capsule (40 mg total) by mouth daily at 12 noon.    Dispense:  90 capsule    Refill:  1  . DULoxetine (CYMBALTA) 30 MG capsule    Sig: Take 1 capsule (30 mg total) by mouth daily.    Dispense:  90 capsule    Refill:  3  . simvastatin (ZOCOR) 40 MG tablet    Sig: Take 1 tablet (40 mg total) by mouth daily.    Dispense:  90 tablet    Refill:  1  . DULoxetine (CYMBALTA) 60 MG capsule    Sig: Take 1 capsule (60 mg total) by mouth daily.    Dispense:  90 capsule    Refill:  1  . amphetamine-dextroamphetamine (ADDERALL) 20 MG tablet    Sig: Take 1 tablet (20 mg  total) by mouth 2 (two) times daily.    Dispense:  180 tablet    Refill:  0    Return in about 2 months (around 07/28/2017) for complete physical examiniation .   [name] [name], M.D. Primary Care at [facility] [address] [place], [place]  [address] [phone] phone [phone] fax

## 2017-05-29 LAB — CBC WITH DIFFERENTIAL/PLATELET
BASOS: 0 %
Basophils Absolute: 0 10*3/uL (ref 0.0–0.2)
EOS (ABSOLUTE): 0.1 10*3/uL (ref 0.0–0.4)
EOS: 1 %
HEMATOCRIT: 45.8 % (ref 34.0–46.6)
Hemoglobin: 14.7 g/dL (ref 11.1–15.9)
IMMATURE GRANULOCYTES: 0 %
Immature Grans (Abs): 0 10*3/uL (ref 0.0–0.1)
Lymphocytes Absolute: 2.5 10*3/uL (ref 0.7–3.1)
Lymphs: 27 %
MCH: 29.2 pg (ref 26.6–33.0)
MCHC: 32.1 g/dL (ref 31.5–35.7)
MCV: 91 fL (ref 79–97)
MONOS ABS: 0.7 10*3/uL (ref 0.1–0.9)
Monocytes: 7 %
NEUTROS PCT: 65 %
Neutrophils Absolute: 6 10*3/uL (ref 1.4–7.0)
PLATELETS: 416 10*3/uL — AB (ref 150–379)
RBC: 5.04 x10E6/uL (ref 3.77–5.28)
RDW: 15 % (ref 12.3–15.4)
WBC: 9.3 10*3/uL (ref 3.4–10.8)

## 2017-05-29 LAB — COMPREHENSIVE METABOLIC PANEL
ALBUMIN: 4.7 g/dL (ref 3.6–4.8)
ALT: 19 IU/L (ref 0–32)
AST: 21 IU/L (ref 0–40)
Albumin/Globulin Ratio: 1.8 (ref 1.2–2.2)
Alkaline Phosphatase: 109 IU/L (ref 39–117)
BUN / CREAT RATIO: 16 (ref 12–28)
BUN: 13 mg/dL (ref 8–27)
Bilirubin Total: 0.3 mg/dL (ref 0.0–1.2)
CO2: 26 mmol/L (ref 20–29)
CREATININE: 0.82 mg/dL (ref 0.57–1.00)
Calcium: 10 mg/dL (ref 8.7–10.3)
Chloride: 102 mmol/L (ref 96–106)
GFR calc non Af Amer: 75 mL/min/{1.73_m2} (ref >59)
GFR, EST AFRICAN AMERICAN: 86 mL/min/{1.73_m2} (ref >59)
GLOBULIN, TOTAL: 2.6 g/dL (ref 1.5–4.5)
GLUCOSE: 104 mg/dL — AB (ref 65–99)
Potassium: 5.2 mmol/L (ref 3.5–5.2)
Sodium: 141 mmol/L (ref 134–144)
TOTAL PROTEIN: 7.3 g/dL (ref 6.0–8.5)

## 2017-05-29 LAB — H. PYLORI BREATH TEST: H pylori Breath Test: NEGATIVE

## 2017-05-29 LAB — LIPID PANEL
CHOL/HDL RATIO: 3.2 ratio (ref 0.0–4.4)
Cholesterol, Total: 201 mg/dL — ABNORMAL HIGH (ref 100–199)
HDL: 62 mg/dL (ref >39)
LDL CALC: 95 mg/dL (ref 0–99)
Triglycerides: 220 mg/dL — ABNORMAL HIGH (ref 0–149)
VLDL Cholesterol Cal: 44 mg/dL — ABNORMAL HIGH (ref 5–40)

## 2017-05-29 LAB — HEMOGLOBIN A1C
Est. average glucose Bld gHb Est-mCnc: 123 mg/dL
Hgb A1c MFr Bld: 5.9 % — ABNORMAL HIGH (ref 4.8–5.6)

## 2017-05-29 LAB — URINE CULTURE

## 2017-05-29 LAB — TSH: TSH: 2.33 u[IU]/mL (ref 0.450–4.500)

## 2017-06-18 ENCOUNTER — Ambulatory Visit: Payer: BC Managed Care – PPO | Admitting: Podiatry

## 2017-06-18 ENCOUNTER — Other Ambulatory Visit: Payer: Self-pay | Admitting: Podiatry

## 2017-06-18 ENCOUNTER — Ambulatory Visit (INDEPENDENT_AMBULATORY_CARE_PROVIDER_SITE_OTHER): Payer: BC Managed Care – PPO

## 2017-06-18 ENCOUNTER — Encounter: Payer: Self-pay | Admitting: Podiatry

## 2017-06-18 DIAGNOSIS — M778 Other enthesopathies, not elsewhere classified: Secondary | ICD-10-CM

## 2017-06-18 DIAGNOSIS — M205X1 Other deformities of toe(s) (acquired), right foot: Secondary | ICD-10-CM | POA: Diagnosis not present

## 2017-06-18 DIAGNOSIS — M779 Enthesopathy, unspecified: Principal | ICD-10-CM

## 2017-06-18 DIAGNOSIS — M2011 Hallux valgus (acquired), right foot: Secondary | ICD-10-CM | POA: Diagnosis not present

## 2017-06-18 DIAGNOSIS — M7751 Other enthesopathy of right foot: Secondary | ICD-10-CM | POA: Diagnosis not present

## 2017-06-18 DIAGNOSIS — M79671 Pain in right foot: Secondary | ICD-10-CM

## 2017-06-18 DIAGNOSIS — M79672 Pain in left foot: Secondary | ICD-10-CM

## 2017-06-18 MED ORDER — TRIAMCINOLONE ACETONIDE 10 MG/ML IJ SUSP
10.0000 mg | Freq: Once | INTRAMUSCULAR | Status: AC
  Administered 2017-06-18: 10 mg

## 2017-06-20 NOTE — Progress Notes (Signed)
Subjective:   Patient ID: [name], female   DOB: 67 y.o.   MRN: [id number]   HPI Patient presents stating of had a lot of pain between the fourth and fifth toe of my right foot and I do not remember injury and also I do have a bunion on my left that at times bothers me and my right big toe joint at times is stiff but overall is doing okay   ROS      Objective:  Physical Exam  Neurovascular status intact with patient's found to have mild reduction of range of motion first MPJ right but no crepitus with toes move in the fourth interspace there is inflammation in the interspace with fluid buildup and there is a small amount of hyperkeratotic tissue between the fourth and fifth toes.  Left foot shows moderate structural bunion deformity upon evaluation     Assessment:  Did have some movement after osteotomy surgery right first metatarsal with slight arthritic processes occurring and is found to have inflammatory capsulitis fourth interspace right with possibility for interspace lesion and moderate structural bunion deformity left     Plan:  H&P x-rays reviewed and today I did inject the fourth interspace 3 mg dexamethasone Kenalog 5 mg Xylocaine and I discussed other problems did not recommend treatment unless they were to get worse  X-rays indicate that the patient does have moderate arthritis the first metatarsal right but it is stable with fixation in place and has abnormal positioning of the fourth and fifth toes right with pressure point and moderate structural bunion deformity left

## 2017-06-26 ENCOUNTER — Encounter: Payer: Self-pay | Admitting: Family Medicine

## 2017-07-02 ENCOUNTER — Encounter: Payer: Self-pay | Admitting: Family Medicine

## 2017-07-02 ENCOUNTER — Other Ambulatory Visit: Payer: Self-pay

## 2017-07-02 ENCOUNTER — Ambulatory Visit (INDEPENDENT_AMBULATORY_CARE_PROVIDER_SITE_OTHER): Payer: BC Managed Care – PPO | Admitting: Family Medicine

## 2017-07-02 VITALS — BP 128/70 | HR 100 | Temp 99.9°F | Resp 16 | Ht 60.24 in | Wt 182.0 lb

## 2017-07-02 DIAGNOSIS — N3941 Urge incontinence: Secondary | ICD-10-CM | POA: Diagnosis not present

## 2017-07-02 DIAGNOSIS — E785 Hyperlipidemia, unspecified: Secondary | ICD-10-CM | POA: Diagnosis not present

## 2017-07-02 DIAGNOSIS — E7800 Pure hypercholesterolemia, unspecified: Secondary | ICD-10-CM | POA: Diagnosis not present

## 2017-07-02 DIAGNOSIS — R739 Hyperglycemia, unspecified: Secondary | ICD-10-CM | POA: Diagnosis not present

## 2017-07-02 DIAGNOSIS — Z0001 Encounter for general adult medical examination with abnormal findings: Secondary | ICD-10-CM

## 2017-07-02 DIAGNOSIS — R6889 Other general symptoms and signs: Secondary | ICD-10-CM

## 2017-07-02 DIAGNOSIS — Z1239 Encounter for other screening for malignant neoplasm of breast: Secondary | ICD-10-CM

## 2017-07-02 DIAGNOSIS — E2839 Other primary ovarian failure: Secondary | ICD-10-CM

## 2017-07-02 DIAGNOSIS — Z124 Encounter for screening for malignant neoplasm of cervix: Secondary | ICD-10-CM

## 2017-07-02 DIAGNOSIS — Z01810 Encounter for preprocedural cardiovascular examination: Secondary | ICD-10-CM | POA: Diagnosis not present

## 2017-07-02 DIAGNOSIS — Z0000 Encounter for general adult medical examination without abnormal findings: Secondary | ICD-10-CM

## 2017-07-02 DIAGNOSIS — Z1231 Encounter for screening mammogram for malignant neoplasm of breast: Secondary | ICD-10-CM

## 2017-07-02 LAB — POCT URINALYSIS DIP (MANUAL ENTRY)
BILIRUBIN UA: NEGATIVE
Glucose, UA: NEGATIVE mg/dL
Ketones, POC UA: NEGATIVE mg/dL
NITRITE UA: NEGATIVE
PH UA: 6 (ref 5.0–8.0)
PROTEIN UA: NEGATIVE mg/dL
RBC UA: NEGATIVE
Spec Grav, UA: 1.025 (ref 1.010–1.025)
Urobilinogen, UA: 0.2 E.U./dL

## 2017-07-02 LAB — POC INFLUENZA A&B (BINAX/QUICKVUE)
INFLUENZA B, POC: NEGATIVE
Influenza A, POC: NEGATIVE

## 2017-07-02 NOTE — Patient Instructions (Addendum)
   IF you received an x-ray today, you will receive an invoice from [facility]. Please contact [facility] at [phone] with questions or concerns regarding your invoice.   IF you received labwork today, you will receive an invoice from [facility]. Please contact [facility] at [phone] with questions or concerns regarding your invoice.   Our billing staff will not be able to assist you with questions regarding bills from these companies.  You will be contacted with the lab results as soon as they are available. The fastest way to get your results is to activate your My Chart account. Instructions are located on the last page of this paperwork. If you have not heard from us regarding the results in 2 weeks, please contact this office.      Preventive Care 65 Years and Older, Female Preventive care refers to lifestyle choices and visits with your health care provider that can promote health and wellness. What does preventive care include?  A yearly physical exam. This is also called an annual well check.  Dental exams once or twice a year.  Routine eye exams. Ask your health care provider how often you should have your eyes checked.  Personal lifestyle choices, including: ? Daily care of your teeth and gums. ? Regular physical activity. ? Eating a healthy diet. ? Avoiding tobacco and drug use. ? Limiting alcohol use. ? Practicing safe sex. ? Taking low-dose aspirin every day. ? Taking vitamin and mineral supplements as recommended by your health care provider. What happens during an annual well check? The services and screenings done by your health care provider during your annual well check will depend on your age, overall health, lifestyle risk factors, and family history of disease. Counseling Your health care provider may ask you questions about your:  Alcohol use.  Tobacco use.  Drug use.  Emotional well-being.  Home and relationship  well-being.  Sexual activity.  Eating habits.  History of falls.  Memory and ability to understand (cognition).  Work and work environment.  Reproductive health.  Screening You may have the following tests or measurements:  Height, weight, and BMI.  Blood pressure.  Lipid and cholesterol levels. These may be checked every 5 years, or more frequently if you are over 50 years old.  Skin check.  Lung cancer screening. You may have this screening every year starting at age 55 if you have a 30-pack-year history of smoking and currently smoke or have quit within the past 15 years.  Fecal occult blood test (FOBT) of the stool. You may have this test every year starting at age 50.  Flexible sigmoidoscopy or colonoscopy. You may have a sigmoidoscopy every 5 years or a colonoscopy every 10 years starting at age 50.  Hepatitis C blood test.  Hepatitis B blood test.  Sexually transmitted disease (STD) testing.  Diabetes screening. This is done by checking your blood sugar (glucose) after you have not eaten for a while (fasting). You may have this done every 1-3 years.  Bone density scan. This is done to screen for osteoporosis. You may have this done starting at age 65.  Mammogram. This may be done every 1-2 years. Talk to your health care provider about how often you should have regular mammograms.  Talk with your health care provider about your test results, treatment options, and if necessary, the need for more tests. Vaccines Your health care provider may recommend certain vaccines, such as:  Influenza vaccine. This is recommended every year.    Tetanus, diphtheria, and acellular pertussis (Tdap, Td) vaccine. You may need a Td booster every 10 years.  Varicella vaccine. You may need this if you have not been vaccinated.  Zoster vaccine. You may need this after age 60.  Measles, mumps, and rubella (MMR) vaccine. You may need at least one dose of MMR if you were born in  1957 or later. You may also need a second dose.  Pneumococcal 13-valent conjugate (PCV13) vaccine. One dose is recommended after age 65.  Pneumococcal polysaccharide (PPSV23) vaccine. One dose is recommended after age 65.  Meningococcal vaccine. You may need this if you have certain conditions.  Hepatitis A vaccine. You may need this if you have certain conditions or if you travel or work in places where you may be exposed to hepatitis A.  Hepatitis B vaccine. You may need this if you have certain conditions or if you travel or work in places where you may be exposed to hepatitis B.  Haemophilus influenzae type b (Hib) vaccine. You may need this if you have certain conditions.  Talk to your health care provider about which screenings and vaccines you need and how often you need them. This information is not intended to replace advice given to you by your health care provider. Make sure you discuss any questions you have with your health care provider. Document Released: 02/12/2015 Document Revised: 10/06/2015 Document Reviewed: 11/17/2014 Elsevier Interactive Patient Education  2018 [employer]  

## 2017-07-02 NOTE — Progress Notes (Signed)
Subjective:    Patient ID: [name], female    DOB: [birth year], 67 y.o.   MRN: [id number]  07/02/2017  Annual Exam and Nasal Congestion    HPI This 67 y.o. female presents for COMPLETE PHYSICAL EXAMINATION.  Cold: achy; low grade fever; clammy.  Sore throat.  HA.  No ear pain.  +rhinorrhea; +nasal congestion.  +PND; +coughing; +sputum production; using netti pot.  Feeling horrible; did not feel claritin today; taking Dayquil.    Needs to get surgery done; everything hinges on something else.  Wanted to work one more year.  Time will tell.     Visual Acuity Screening   Right eye Left eye Both eyes  Without correction:     With correction: 20/15 20/40 20/15     BP Readings from Last 3 Encounters:  07/02/17 128/70  05/28/17 118/78  01/11/17 128/88   Wt Readings from Last 3 Encounters:  07/02/17 182 lb (82.6 kg)  05/28/17 178 lb (80.7 kg)  01/11/17 176 lb (79.8 kg)   Immunization History  Administered Date(s) Administered  . Influenza Inj Mdck Quad With Preservative 10/24/2016  . Influenza Split 12/01/2010  . Influenza,inj,Quad PF,6+ Mos 11/29/2015  . Influenza-Unspecified 09/30/2012, 10/08/2013, 10/31/2014  . Pneumococcal Conjugate-13 11/29/2015  . Pneumococcal Polysaccharide-23 05/28/2017  . Tdap 12/30/2009  . Zoster 09/30/2012   Health Maintenance  Topic Date Due  . INFLUENZA VACCINE  08/30/2017  . MAMMOGRAM  11/28/2017  . COLONOSCOPY  02/25/2018  . TETANUS/TDAP  12/31/2019  . DEXA SCAN  Completed  . Hepatitis C Screening  Completed  . PNA vac Low Risk Adult  Completed    Review of Systems  Constitutional: Positive for diaphoresis, fatigue and fever. Negative for activity change, appetite change, chills and unexpected weight change.  HENT: Positive for congestion, rhinorrhea and sore throat. Negative for dental problem, drooling, ear discharge, ear pain, facial swelling, hearing loss, mouth sores, nosebleeds, postnasal drip, sinus pressure, sneezing,  tinnitus, trouble swallowing and voice change.   Eyes: Negative for photophobia, pain, discharge, redness, itching and visual disturbance.  Respiratory: Positive for wheezing. Negative for apnea, cough, choking, chest tightness, shortness of breath and stridor.   Cardiovascular: Negative for chest pain, palpitations and leg swelling.  Gastrointestinal: Negative for abdominal distention, abdominal pain, anal bleeding, blood in stool, constipation, diarrhea, nausea, rectal pain and vomiting.  Endocrine: Negative for cold intolerance, heat intolerance, polydipsia, polyphagia and polyuria.  Genitourinary: Negative for decreased urine volume, difficulty urinating, dyspareunia, dysuria, enuresis, flank pain, frequency, genital sores, hematuria, menstrual problem, pelvic pain, urgency, vaginal bleeding, vaginal discharge and vaginal pain.  Musculoskeletal: Positive for arthralgias. Negative for back pain, gait problem, joint swelling, myalgias, neck pain and neck stiffness.  Skin: Negative for color change, pallor, rash and wound.  Allergic/Immunologic: Negative for environmental allergies, food allergies and immunocompromised state.  Neurological: Negative for dizziness, tremors, seizures, syncope, facial asymmetry, speech difficulty, weakness, light-headedness, numbness and headaches.  Hematological: Negative for adenopathy. Does not bruise/bleed easily.  Psychiatric/Behavioral: Positive for decreased concentration. Negative for agitation, behavioral problems, confusion, dysphoric mood, hallucinations, self-injury, sleep disturbance and suicidal ideas. The patient is not nervous/anxious and is not hyperactive.     Past Medical History:  Diagnosis Date  . ADD (attention deficit disorder)   . Arthritis    "bunion on  right" (08/11/2014)  . Chronic bronchitis ([place])    "plenty; but not q yr" (08/11/2014)  . Depression   . Dyslipidemia   . Environmental allergies   . GERD (gastroesophageal reflux  disease)   . Hearing difficulty of left ear    "grew up w/deaf parent; find myself lip reading more recently" (08/11/2014)  . Hepatitis    "exposed in college; rec'd gamma globulin; 6 months later S/S (weakness, lethargy, fevers); ; dx'd infectious hepatitis"  . History of herpes genitalis    "stress induced reoccurance that shows up on my left middle finger" (08/11/2014)  . History of hiatal hernia   . History of stomach ulcers   . Hypercholesterolemia   . Hyperthyroidism 1994   "postpartum only; resolved itself"  . Incisional hernia   . Internal hemorrhoids   . Leg cramping   . Osteopenia   . Seasonal asthma   . Stress incontinence, female   . Ulcer    Past Surgical History:  Procedure Laterality Date  . CESAREAN SECTION  1994  . CHALAZION EXCISION Left ~ 2013  . DILATION AND CURETTAGE OF UTERUS  X 2  . EYE SURGERY    . HERNIA REPAIR  08/11/2014  . INCISIONAL HERNIA REPAIR N/A 08/11/2014   Procedure: LAPAROSCOPIC INCISIONAL HERNIA REPAIR;  Surgeon: Coralie Keens, MD;  Location: [facility];  Service: General;  Laterality: N/A;  . INCONTINENCE SURGERY  2005  . INGUINAL HERNIA REPAIR Left 08/11/2014  . INGUINAL HERNIA REPAIR Left 08/11/2014   Procedure: LEFT INGUINAL HERNIA REPAIR;  Surgeon: Coralie Keens, MD;  Location: [facility];  Service: General;  Laterality: Left;  . INSERTION OF MESH Left 08/11/2014   Procedure: INSERTION OF MESH TO LEFT GROIN AND ABDOMEN;  Surgeon: Coralie Keens, MD;  Location: [facility];  Service: General;  Laterality: Left;  . LAPAROSCOPIC INCISIONAL / UMBILICAL / VENTRAL HERNIA REPAIR  08/11/2014   [facility]  . NASAL POLYP EXCISION  2014  . REPAIR OF PERFORATED ULCER  1985  . TONSILLECTOMY     Allergies  Allergen Reactions  . Aspirin     Other reaction(s): GI Intolerance History of ulcers  . Bee Venom     Any insects that bite or stings  . Nsaids Other (See Comments)    History of ulcers   Current Outpatient Medications on File Prior to Visit  Medication  Sig Dispense Refill  . acetaminophen (TYLENOL) 650 MG CR tablet Take 1,300 mg by mouth every 8 (eight) hours as needed for pain.    Marland Kitchen albuterol (PROAIR HFA) 108 (90 Base) MCG/ACT inhaler INHALE 2 PUFFS INTO THE LUNGS EVERY 6 (SIX) HOURS AS NEEDED *EMERGENCY FILL* 8.5 Inhaler 3  . amphetamine-dextroamphetamine (ADDERALL) 20 MG tablet Take 1 tablet (20 mg total) by mouth 2 (two) times daily. 180 tablet 0  . cyclobenzaprine (FLEXERIL) 10 MG tablet Take 1 tablet (10 mg total) by mouth 2 (two) times daily as needed for muscle spasms. 90 tablet 1  . DULoxetine (CYMBALTA) 30 MG capsule Take 1 capsule (30 mg total) by mouth daily. 90 capsule 3  . DULoxetine (CYMBALTA) 60 MG capsule Take 1 capsule (60 mg total) by mouth daily. 90 capsule 1  . esomeprazole (NEXIUM) 40 MG capsule Take 1 capsule (40 mg total) by mouth daily at 12 noon. 90 capsule 1  . ipratropium (ATROVENT) 0.03 % nasal spray PLACE 2 SPRAYS INTO THE NOSE 4 (FOUR) TIMES DAILY. USE AS NEEDED FOR RUNNY NOSE AND CONGESTION 30 mL 1  . loratadine (CLARITIN) 10 MG tablet Take 1 tablet (10 mg total) by mouth daily. 90 tablet 3  . Magnesium 500 MG CAPS Take by mouth.    . mometasone (NASONEX) 50 MCG/ACT nasal spray  INHALE 2 SPRAYS INTO THE NOSE DAILY AS NEEDED 17 g 5  . simvastatin (ZOCOR) 40 MG tablet Take 1 tablet (40 mg total) by mouth daily. 90 tablet 1   No current facility-administered medications on file prior to visit.    Social History   Socioeconomic History  . Marital status: Divorced    Spouse name: Not on file  . Number of children: Not on file  . Years of education: Not on file  . Highest education level: Not on file  Occupational History  . Occupation: Consulting civil engineer    Comment: Full time; Barista  Social Needs  . Financial resource strain: Not on file  . Food insecurity:    Worry: Not on file    Inability: Not on file  . Transportation needs:    Medical: Not on file    Non-medical: Not on file    Tobacco Use  . Smoking status: Never Smoker  . Smokeless tobacco: Never Used  Substance and Sexual Activity  . Alcohol use: Yes    Alcohol/week: 1.2 oz    Types: 1 Glasses of wine, 1 Shots of liquor per week    Comment: 08/11/2014 "0-2 glasses of wine or a mixed drink q wk"  . Drug use: No  . Sexual activity: Never    Birth control/protection: Post-menopausal  Lifestyle  . Physical activity:    Days per week: Not on file    Minutes per session: Not on file  . Stress: Not on file  Relationships  . Social connections:    Talks on phone: Not on file    Gets together: Not on file    Attends religious service: Not on file    Active member of club or organization: Not on file    Attends meetings of clubs or organizations: Not on file    Relationship status: Not on file  . Intimate partner violence:    Fear of current or ex partner: Not on file    Emotionally abused: Not on file    Physically abused: Not on file    Forced sexual activity: Not on file  Other Topics Concern  . Not on file  Social History Narrative   Marital status: divorced; not dating      Children: 1 daughter; no grandchildren      Lives:        Employment: Optometrist      Tobacco:       Alcohol:      Drugs:       Exercise:    Family History  Problem Relation Age of Onset  . Arthritis Mother   . Heart disease Mother        CABG @ 2  . Diabetes Mother   . Osteoporosis Mother   . Hyperlipidemia Mother   . Arthritis Father   . Lymphoma Father 48       chemo  . Cancer Father        lymphoma  . Lymphoma Paternal Grandmother   . Diabetes Maternal Grandmother        Objective:    BP 128/70   Pulse 100   Temp 99.9 F (37.7 C) (Oral)   Resp 16   Ht 5' 0.24" (1.53 m)   Wt 182 lb (82.6 kg)   LMP 08/30/2008   SpO2 97%   BMI 35.27 kg/m  Physical Exam  Constitutional: She is oriented to person, place, and time. She appears well-developed and well-nourished. No distress.  HENT:  Head:  Normocephalic and atraumatic.  Right Ear: Tympanic membrane, external ear and ear canal normal.  Left Ear: Tympanic membrane, external ear and ear canal normal.  Nose: Mucosal edema and rhinorrhea present.  Mouth/Throat: Uvula is midline, oropharynx is clear and moist and mucous membranes are normal.  Eyes: Pupils are equal, round, and reactive to light. Conjunctivae and EOM are normal.  Neck: Normal range of motion and full passive range of motion without pain. Neck supple. No JVD present. Carotid bruit is not present. No thyromegaly present.  Cardiovascular: Normal rate, regular rhythm and normal heart sounds. Exam reveals no gallop and no friction rub.  No murmur heard. Pulmonary/Chest: Effort normal and breath sounds normal. She has no wheezes. She has no rales. Right breast exhibits no inverted nipple, no mass, no nipple discharge, no skin change and no tenderness. Left breast exhibits no inverted nipple, no mass, no nipple discharge, no skin change and no tenderness. No breast swelling, tenderness, discharge or bleeding.  Abdominal: Soft. Bowel sounds are normal. She exhibits no distension and no mass. There is no tenderness. There is no rebound and no guarding.  Genitourinary: Vagina normal and uterus normal. No breast swelling, tenderness, discharge or bleeding. There is no rash, tenderness or lesion on the right labia. There is no rash, tenderness or lesion on the left labia. Cervix exhibits no motion tenderness, no discharge and no friability. Right adnexum displays no mass, no tenderness and no fullness. Left adnexum displays no mass, no tenderness and no fullness.  Musculoskeletal:       Right shoulder: Normal.       Left shoulder: Normal.       Cervical back: Normal.  Lymphadenopathy:    She has no cervical adenopathy.  Neurological: She is alert and oriented to person, place, and time. She has normal reflexes. No cranial nerve deficit. She exhibits normal muscle tone. Coordination  normal.  Skin: Skin is warm and dry. No rash noted. She is not diaphoretic. No erythema. No pallor.  Psychiatric: She has a normal mood and affect. Her behavior is normal. Judgment and thought content normal.  Nursing note and vitals reviewed.  No results found. Depression screen [facility] 2/9 07/02/2017 05/28/2017 01/11/2017 12/09/2016 12/09/2016  Decreased Interest 0 0 0 0 0  Down, Depressed, Hopeless 0 0 0 0 0  PHQ - 2 Score 0 0 0 0 0   Fall Risk  07/02/2017 05/28/2017 01/11/2017 12/09/2016 12/09/2016  Falls in the past year? No No No No No  Number falls in past yr: - - - - -  Injury with Fall? - - - - -        Assessment & Plan:   1. Routine physical examination   2. Dyslipidemia   3. Hyperglycemia   4. Pure hypercholesterolemia   5. Cervical cancer screening   6. Flu-like symptoms   7. Preoperative cardiovascular examination   8. Urge incontinence   9. Breast cancer screening   10. Estrogen deficiency     -anticipatory guidance provided --- exercise, weight loss, safe driving practices, calcium 600mg  twice daily.  -obtain age appropriate screening labs and labs for chronic disease management. -pap smear obtained; refer for mammogram and bone density scan. -flu like symptoms: New; flu swab negative; treat supportively with rest, fluids, Tylenol, Mucinex DM, netti pot.  -Pre-operative medical clearance: obtain labs with EKG for upcoming orthopedic surgery. -Urge incontinence: worsening; refer to urology.    Orders Placed This Encounter  Procedures  .  MM Digital Screening    Standing Status:   Future    Standing Expiration Date:   09/02/2018    Order Specific Question:   Reason for Exam (SYMPTOM  OR DIAGNOSIS REQUIRED)    Answer:   annual screening    Order Specific Question:   Preferred imaging location?    Answer:   [facility]  . DG Bone Density    Standing Status:   Future    Standing Expiration Date:   09/02/2018    Order Specific Question:   Reason for Exam  (SYMPTOM  OR DIAGNOSIS REQUIRED)    Answer:   estrogen deficiency    Order Specific Question:   Preferred imaging location?    Answer:   [facility]  . CBC with Differential/Platelet  . Comprehensive metabolic panel    Order Specific Question:   Has the patient fasted?    Answer:   No  . Hemoglobin A1c  . Lipid panel    Order Specific Question:   Has the patient fasted?    Answer:   No  . TSH  . Ambulatory referral to Urology    Referral Priority:   Routine    Referral Type:   Consultation    Referral Reason:   Specialty Services Required    Requested Specialty:   Urology    Number of Visits Requested:   1  . POCT urinalysis dipstick  . POC Influenza A&B(BINAX/QUICKVUE)  . EKG 12-Lead   No orders of the defined types were placed in this encounter.   Return in about 6 months (around 01/01/2018) for follow-up chronic medical conditions [facility].   Ryzen Deady Elayne Guerin, M.D. Primary [facility] at [facility] [address] [place], [place]  [address] [phone] phone [phone] fax

## 2017-07-03 LAB — CBC WITH DIFFERENTIAL/PLATELET
Basophils Absolute: 0 10*3/uL (ref 0.0–0.2)
Basos: 0 %
EOS (ABSOLUTE): 0.2 10*3/uL (ref 0.0–0.4)
EOS: 2 %
HEMATOCRIT: 36.3 % (ref 34.0–46.6)
Hemoglobin: 12 g/dL (ref 11.1–15.9)
Immature Grans (Abs): 0 10*3/uL (ref 0.0–0.1)
Immature Granulocytes: 0 %
LYMPHS ABS: 1.8 10*3/uL (ref 0.7–3.1)
Lymphs: 18 %
MCH: 28.2 pg (ref 26.6–33.0)
MCHC: 33.1 g/dL (ref 31.5–35.7)
MCV: 85 fL (ref 79–97)
MONOS ABS: 0.7 10*3/uL (ref 0.1–0.9)
Monocytes: 7 %
Neutrophils Absolute: 7.3 10*3/uL — ABNORMAL HIGH (ref 1.4–7.0)
Neutrophils: 73 %
Platelets: 357 10*3/uL (ref 150–450)
RBC: 4.25 x10E6/uL (ref 3.77–5.28)
RDW: 15.3 % (ref 12.3–15.4)
WBC: 9.9 10*3/uL (ref 3.4–10.8)

## 2017-07-03 LAB — COMPREHENSIVE METABOLIC PANEL
ALK PHOS: 104 IU/L (ref 39–117)
ALT: 26 IU/L (ref 0–32)
AST: 22 IU/L (ref 0–40)
Albumin/Globulin Ratio: 1.7 (ref 1.2–2.2)
Albumin: 4.3 g/dL (ref 3.6–4.8)
BUN/Creatinine Ratio: 16 (ref 12–28)
BUN: 12 mg/dL (ref 8–27)
Bilirubin Total: 0.4 mg/dL (ref 0.0–1.2)
CO2: 24 mmol/L (ref 20–29)
CREATININE: 0.75 mg/dL (ref 0.57–1.00)
Calcium: 9.5 mg/dL (ref 8.7–10.3)
Chloride: 100 mmol/L (ref 96–106)
GFR calc Af Amer: 96 mL/min/{1.73_m2} (ref >59)
GFR calc non Af Amer: 83 mL/min/{1.73_m2} (ref >59)
GLOBULIN, TOTAL: 2.5 g/dL (ref 1.5–4.5)
Glucose: 94 mg/dL (ref 65–99)
Potassium: 4.5 mmol/L (ref 3.5–5.2)
SODIUM: 139 mmol/L (ref 134–144)
Total Protein: 6.8 g/dL (ref 6.0–8.5)

## 2017-07-03 LAB — TSH: TSH: 1.9 u[IU]/mL (ref 0.450–4.500)

## 2017-07-03 LAB — HEMOGLOBIN A1C
ESTIMATED AVERAGE GLUCOSE: 128 mg/dL
HEMOGLOBIN A1C: 6.1 % — AB (ref 4.8–5.6)

## 2017-07-03 LAB — LIPID PANEL
Chol/HDL Ratio: 2.8 ratio (ref 0.0–4.4)
Cholesterol, Total: 190 mg/dL (ref 100–199)
HDL: 69 mg/dL (ref >39)
LDL Calculated: 98 mg/dL (ref 0–99)
Triglycerides: 114 mg/dL (ref 0–149)
VLDL Cholesterol Cal: 23 mg/dL (ref 5–40)

## 2017-07-04 LAB — PAP IG AND HPV HIGH-RISK
HPV, high-risk: NEGATIVE
PAP Smear Comment: 0

## 2017-07-05 ENCOUNTER — Telehealth: Payer: Self-pay | Admitting: Family Medicine

## 2017-07-05 DIAGNOSIS — J3489 Other specified disorders of nose and nasal sinuses: Secondary | ICD-10-CM

## 2017-07-05 NOTE — Telephone Encounter (Signed)
Copied from [place] [id number]. Topic: Quick Communication - Rx Refill/Question >> Jul 05, 2017  5:25 PM [name] wrote: Medication: Benzonatate 100mg  (out of script)  Has the patient contacted their pharmacy? Yes.   (Agent: If no, request that the patient contact the pharmacy for the refill.) (Agent: If yes, when and what did the pharmacy advise?)  Preferred Pharmacy (with phone number or street name): [facility] [place], [address] [address] Phone: [phone] Fax: [phone]  Agent: Please be advised that RX refills may take up to 3 business days. We ask that you follow-up with your pharmacy.  Pharmacy faxed yesterday.

## 2017-07-05 NOTE — Telephone Encounter (Signed)
Pt requesting refill of Benzonatate. Medication not on pt's current medication list.   LOV: 07/02/17 PCP: Dr. Horton Finer

## 2017-07-06 NOTE — Telephone Encounter (Signed)
Patient needs office visit to refill medication not currently on medication list. Please call patient to schedule.

## 2017-07-11 NOTE — Telephone Encounter (Signed)
Patient was just here on 6/3 for a CPE with Dr Tamala Julian and since it is so hard to get patients in on Dr Thompson Caul schedule since she is leaving does she have to come back in for this medication to be refilled?  Just want to clarify before I call her.

## 2017-07-12 MED ORDER — BENZONATATE 100 MG PO CAPS: 100.0000 mg | ORAL_CAPSULE | Freq: Three times a day (TID) | ORAL | 0 refills | Status: DC | PRN

## 2017-07-13 MED ORDER — AMOXICILLIN-POT CLAVULANATE 875-125 MG PO TABS: 1.0000 | ORAL_TABLET | Freq: Two times a day (BID) | ORAL | 0 refills | Status: DC

## 2017-07-13 MED ORDER — IPRATROPIUM BROMIDE 0.03 % NA SOLN: 2.0000 | Freq: Four times a day (QID) | NASAL | 1 refills | Status: DC

## 2017-07-13 NOTE — Telephone Encounter (Signed)
Call --  I have sent in Augmentin for patient to take for sinusitis.  Continue Tessalon Perles, inhaler, Nasonex.  Also make sure she is taking Atrovent nasal spray; I sent in a refill.

## 2017-07-13 NOTE — Telephone Encounter (Signed)
Phone call to patient. Relayed message from Dr. Tamala Julian. She states she still has a productive cough. Taking Mucinex/cough suppressant. Mucous is an "unreasonable amount" per patient, describes as a brownish and yellow color. Describes consistency as coughing up chunks, states she feels like she is choking on them sometimes. States she is exhausted from coughing at night, but relieved she is finally done with school. Continuing to flush nose. Using inhaler, nasonex.   Patient advised to return to clinic or seek care if symptoms continue or begin to worsen. She is agreeable. Call routed to provider.

## 2017-07-13 NOTE — Telephone Encounter (Signed)
Phone call to patient. Relayed message from provider, she verbalizes understanding. Discussed urology referral - states she may want to be referred to practice in [place]. Patient advised to call back if [facility] referral doesn't work for her and needs referral information sent to [facility] in [place]. She is agreeable.

## 2017-07-13 NOTE — Telephone Encounter (Signed)
Call -- how is patient feeling from acute illness?  I have sent in [facility].

## 2017-07-16 ENCOUNTER — Telehealth: Payer: Self-pay | Admitting: Family Medicine

## 2017-07-16 NOTE — Telephone Encounter (Signed)
Copied from [place] [phone] [phone]. Topic: Inquiry >> Jul 16, 2017 10:07 AM Oliver Pila B wrote: Reason for CRM: amoxicillin-clavulanate (AUGMENTIN) 875-125 MG tablet [[id number]]   Pt called b/c she is having a severe left ear pain and is wondering if this is a side effect of the medication above or if it is totally different symptom from something else; pt was calling to see if she needs to see an ENT; pt states the pain started yesterday

## 2017-07-16 NOTE — Telephone Encounter (Signed)
Returned patients phone call  regarding patients symptoms of l ear earache  Pt was started on Augmentin on 07-13-2017 for possible sinus infection by Dr Tamala Julian  Pt reports l  Earache that started yesterday - she is wondering if it may be side affect of anti biotic or something else - Patient states she has talked to Ent and is waiting to be worked in by [employer]. While discussing the symptoms with patient she stated she had a call coming in from [place] office. She states Dr Ernesto Rutherford will see her in the office in 30 Mins. Pt will see the Ent

## 2017-07-20 ENCOUNTER — Telehealth: Payer: Self-pay | Admitting: Family Medicine

## 2017-07-20 NOTE — Telephone Encounter (Signed)
-----   Message from Hali Marry, MD sent at 07/20/2017 10:45 AM EDT ----- Yes, I will be happy to take her as a patient.   CM ----- Message ----- From: Narda Rutherford, CMA Sent: 07/17/2017  11:32 AM To: Hali Marry, MD  Parnika called to see if Dr Madilyn Fireman was seeing new patient's. She is currently with Reginia Forts, MD. She would like Dr Madilyn Fireman to be her PCP.

## 2017-07-24 NOTE — Telephone Encounter (Signed)
I left pt patient a message informing her Dr.Metheney will take her as a new pt and she can call us back at [phone] to schedule a new pt appointment

## 2017-07-25 ENCOUNTER — Ambulatory Visit: Payer: BC Managed Care – PPO | Admitting: Podiatry

## 2017-07-25 ENCOUNTER — Other Ambulatory Visit: Payer: Self-pay | Admitting: Podiatry

## 2017-07-25 ENCOUNTER — Encounter: Payer: Self-pay | Admitting: Podiatry

## 2017-07-25 ENCOUNTER — Ambulatory Visit (INDEPENDENT_AMBULATORY_CARE_PROVIDER_SITE_OTHER): Payer: BC Managed Care – PPO

## 2017-07-25 DIAGNOSIS — M79671 Pain in right foot: Secondary | ICD-10-CM

## 2017-07-25 DIAGNOSIS — M2041 Other hammer toe(s) (acquired), right foot: Secondary | ICD-10-CM

## 2017-07-25 DIAGNOSIS — L989 Disorder of the skin and subcutaneous tissue, unspecified: Secondary | ICD-10-CM

## 2017-07-25 NOTE — Patient Instructions (Signed)
Pre-Operative Instructions  Congratulations, you have decided to take an important step towards improving your quality of life.  You can be assured that the doctors and staff at [facility] will be with you every step of the way.  Here are some important things you should know:  1. Plan to be at the surgery center/hospital at least 1 (one) hour prior to your scheduled time, unless otherwise directed by the surgical center/hospital staff.  You must have a responsible adult accompany you, remain during the surgery and drive you home.  Make sure you have directions to the surgical center/hospital to ensure you arrive on time. 2. If you are having surgery at [facility] or [facility], you will need a copy of your medical history and physical form from your family physician within one month prior to the date of surgery. We will give you a form for your primary physician to complete.  3. We make every effort to accommodate the date you request for surgery.  However, there are times where surgery dates or times have to be moved.  We will contact you as soon as possible if a change in schedule is required.   4. No aspirin/ibuprofen for one week before surgery.  If you are on aspirin, any non-steroidal anti-inflammatory medications (Mobic, Aleve, Ibuprofen) should not be taken seven (7) days prior to your surgery.  You make take Tylenol for pain prior to surgery.  5. Medications - If you are taking daily heart and blood pressure medications, seizure, reflux, allergy, asthma, anxiety, pain or diabetes medications, make sure you notify the surgery center/hospital before the day of surgery so they can tell you which medications you should take or avoid the day of surgery. 6. No food or drink after midnight the night before surgery unless directed otherwise by surgical center/hospital staff. 7. No alcoholic beverages 24-hours prior to surgery.  No smoking 24-hours prior or 24-hours after  surgery. 8. Wear loose pants or shorts. They should be loose enough to fit over bandages, boots, and casts. 9. Don't wear slip-on shoes. Sneakers are preferred. 10. Bring your boot with you to the surgery center/hospital.  Also bring crutches or a walker if your physician has prescribed it for you.  If you do not have this equipment, it will be provided for you after surgery. 11. If you have not been contacted by the surgery center/hospital by the day before your surgery, call to confirm the date and time of your surgery. 12. Leave-time from work may vary depending on the type of surgery you have.  Appropriate arrangements should be made prior to surgery with your employer. 13. Prescriptions will be provided immediately following surgery by your doctor.  Fill these as soon as possible after surgery and take the medication as directed. Pain medications will not be refilled on weekends and must be approved by the doctor. 14. Remove nail polish on the operative foot and avoid getting pedicures prior to surgery. 15. Wash the night before surgery.  The night before surgery wash the foot and leg well with water and the antibacterial soap provided. Be sure to pay special attention to beneath the toenails and in between the toes.  Wash for at least three (3) minutes. Rinse thoroughly with water and dry well with a towel.  Perform this wash unless told not to do so by your physician.  Enclosed: 1 Ice pack (please put in freezer the night before surgery)   1 Hibiclens skin cleaner     Pre-op instructions  If you have any questions regarding the instructions, please do not hesitate to call our office.  [place]: [address] [place], [place] [address] -- [place]: [address], [place], [place] [address] -- [phone]  [place]: [place], [place] [address] -- [phone]  [place]: [address] [place], [place], [place] [address] -- [phone]  Website:  [email or web address]   Hammer Toe Hammer toe is a change in the shape (a deformity) of your second, third, or fourth toe. The deformity causes the middle joint of your toe to stay bent. This causes pain, especially when you are wearing shoes. Hammer toe starts gradually. At first, the toe can be straightened. Gradually over time, the deformity becomes stiff and permanent. Early treatments to keep the toe straight may relieve pain. As the deformity becomes stiff and permanent, surgery may be needed to straighten the toe. What are the causes? Hammer toe is caused by abnormal bending of the toe joint that is closest to your foot. It happens gradually over time. This pulls on the muscles and connections (tendons) of the toe joint, making them weak and stiff. It is often related to wearing shoes that are too short or narrow and do not let your toes straighten. What increases the risk? You may be at greater risk for hammer toe if you:  Are female.  Are older.  Wear shoes that are too small.  Wear high-heeled shoes that pinch your toes.  Are a Engineer, mining.  Have a second toe that is longer than your big toe (first toe).  Injure your foot or toe.  Have arthritis.  Have a family history of hammer toe.  Have a nerve or muscle disorder.  What are the signs or symptoms? The main symptoms of this condition are pain and deformity of the toe. The pain is worse when wearing shoes, walking, or running. Other symptoms may include:  Corns or calluses over the bent part of the toe or between the toes.  Redness and a burning feeling on the toe.  An open sore that forms on the top of the toe.  Not being able to straighten the toe.  How is this diagnosed? This condition is diagnosed based on your symptoms and a physical exam. During the exam, your health care provider will try to straighten your toe to see how stiff the deformity is. You may also have tests, such as:  A blood test to  check for rheumatoid arthritis.  An X-ray to show how severe the deformity is.  How is this treated? Treatment for this condition will depend on how stiff the deformity is. Surgery is often needed. However, sometimes a hammer toe can be straightened without surgery. Treatments that do not involve surgery include:  Taping the toe into a straightened position.  Using pads and cushions to protect the toe (orthotics).  Wearing shoes that provide enough room for the toes.  Doing toe-stretching exercises at home.  Taking an NSAID to reduce pain and swelling.  If these treatments do not help or the toe cannot be straightened, surgery is the next option. The most common surgeries used to straighten a hammer toe include:  Arthroplasty. In this procedure, part of the joint is removed, and that allows the toe to straighten.  Fusion. In this procedure, cartilage between the two bones of the joint is taken out and the bones are fused together into one longer bone.  Implantation. In this procedure,  part of the bone is removed and replaced with an implant to let the toe move again.  Flexor tendon transfer. In this procedure, the tendons that curl the toes down (flexor tendons) are repositioned.  Follow these instructions at home:  Take over-the-counter and prescription medicines only as told by your health care provider.  Do toe straightening and stretching exercises as told by your health care provider.  Keep all follow-up visits as told by your health care provider. This is important. How is this prevented?  Wear shoes that give your toes enough room and do not cause pain.  Do not wear high-heeled shoes. Contact a health care provider if:  Your pain gets worse.  Your toe becomes red or swollen.  You develop an open sore on your toe. This information is not intended to replace advice given to you by your health care provider. Make sure you discuss any questions you have with your  health care provider. Document Released: 01/14/2000 Document Revised: 08/06/2015 Document Reviewed: 05/12/2015 Elsevier Interactive Patient Education  [employer].

## 2017-07-26 ENCOUNTER — Telehealth: Payer: Self-pay | Admitting: *Deleted

## 2017-07-26 NOTE — Progress Notes (Signed)
Subjective:   Patient ID: [name], female   DOB: 67 y.o.   MRN: [id number]   HPI Patient states this lesion between my toe is really bothering me and making it hard for me to walk.  Patient states that it only got better for a couple weeks after the procedure we did several months ago   ROS      Objective:  Physical Exam  Neurovascular status intact with patient found to have inflammation and pain of the fourth interspace right with fluid buildup and keratotic tissue formation.  Patient's other problems are doing well and are not a problem but this is really giving her an issue and making it hard to walk and she is due for a shoulder replacement in the next several months     Assessment:  Chronic lesion secondary to tissue formation with pain upon palpation     Plan:  Explained bone with bone rubbing with benign soft tissue lesion which was performed between the toes which is chronic in nature with a breakdown of tissue but no redness or indications of infection.  Patient has just finished an antibiotic in the last several days Augmentin that she was on for 10 days.  I have recommended partial soft tissue syndactylization with arthroplasty and I explained procedure and risk and patient at this time after extensive review understanding alternative treatments complications signed consent form and is scheduled for outpatient surgery.  All questions were answered and she is encouraged to call if she should develop any more she is scheduled for outpatient surgery for these problems and understand recovery can take upwards of 6 months

## 2017-07-26 NOTE — Telephone Encounter (Signed)
I left a voicemail message at [phone] (Cell #). After the patient's appointment, the patient had additional questions for Dr. Paulla Dolly.  Patient had questions if any of her medications could cause any type of breakdown in her foot. The answer was no.  Patient also had a question on whether she should be on an antibiotic before having surgery done. The answer is no. Dr. Paulla Dolly did tell the patient that she could soak her foot in epsom salt. That she should use a 1/2 cup of epsom salt and mix into water.

## 2017-07-29 DIAGNOSIS — M19012 Primary osteoarthritis, left shoulder: Secondary | ICD-10-CM | POA: Insufficient documentation

## 2017-07-29 DIAGNOSIS — M170 Bilateral primary osteoarthritis of knee: Secondary | ICD-10-CM | POA: Insufficient documentation

## 2017-07-30 ENCOUNTER — Other Ambulatory Visit: Payer: Self-pay | Admitting: Podiatry

## 2017-07-30 ENCOUNTER — Telehealth: Payer: Self-pay | Admitting: *Deleted

## 2017-07-30 ENCOUNTER — Telehealth: Payer: Self-pay | Admitting: Family Medicine

## 2017-07-30 MED ORDER — HYDROCODONE-ACETAMINOPHEN 10-325 MG PO TABS: 1.0000 | ORAL_TABLET | ORAL | 0 refills | Status: DC | PRN

## 2017-07-30 NOTE — Telephone Encounter (Signed)
"  I'm scheduled with Dr. Paulla Dolly tomorrow at the surgical center.  I have been toughening out an earache that I think may be related to a sinus infection that has come back  I haven't even called the ear doctor yet but I wanted to call and let you know that I had left a message on the voicemail at the surgical center.  I needed to talk to Dr. Paulla Dolly about whether I can have this surgery if I got an ear infection.  Will I need to take care of that before having the surgery?  This is about a surgery scheduled for tomorrow.  I wanted to get in touch with you guys s soon as possible.  I'm going to call my ENT doctor now.  I had already seen him about this, I thought I had cleared it up.  I have really bad ear pain.  I think I may have not yet finished the sinus infection.  Please call to confirm that this is the best way to go.  I'd appreciate it."  "I called this morning and left a message.  I haven't heard anything back."  Dr. Paulla Dolly said it was fine for you to still have the surgery.  He said he'll put you on an antibiotic.  "Well I'm not sure it's a sinus infection.  If's he's going to put me on an antibiotic and pain medication, I'd like to pick it up today."  He normally does not write prescriptions out for pain prior to the surgery date.  "Well I explained to him that I was going to have Melburn Popper take me to surgery and bring me home.  I don't want to stop at a pharmacy on the way home by [place].  I'd like to pick that up today."  Are you going to have someone go with you tomorrow?  "Yes, my daughter is coming with me.  She doesn't drive so we are taking Melburn Popper."  I wanted to make sure.  I will ask Dr. Paulla Dolly and see what he says.  "Will you call me back?"  Yes, I will call you back with his response.

## 2017-07-30 NOTE — Telephone Encounter (Signed)
Copied from [place] [id number]. Topic: Inquiry >> Jul 30, 2017 11:03 AM Scherrie Gerlach wrote: Reason for CRM: pt states the ear pain she complained about and saw Dr Ernesto Rutherford about on 6/17, has returned.  Pt states Dr Ernesto Rutherford is out of the office until next week. Pt took a 10 day script of Augmentin (from Dr Tamala Julian 6/14). Pt is scheduled for foot surgery tomorrow and wants to know to know if Dr Tamala Julian thinks she should have this surgery tomorrow. Pt will be under local anesthesia. Pt has a frequent, not constant piercing pain in her left ear. Pt declined to see another provider, insisting I send Dr Tamala Julian a message, either asking to be worked in today or a call back from her to advise. Pt will wait to hear from Dr Tamala Julian before she schedules with anyone else or goes to UC.  Pt called the surgical center to ask this question as well, but has not gotten a call back.

## 2017-07-30 NOTE — Telephone Encounter (Signed)
"  I am calling to let you know that Dr. Paulla Dolly said you can get your prescription.  You will have to come by our office to pick it up.  However, we close at 5pm.  "What do you mean you close at 5 pm.  That doesn't give me time to pick that up.  Why can't he call it in?"  Vicodin is a narcotic, it can't be called in.  Well can't he do it electronically?  Some of my other doctors have done it that way."  I am not sure if Dr. Paulla Dolly can.  I will ask him.  "This is ridiculous.  You are going to call me at the last minute about this.  Do you realize I called this morning about this?"  You did not mention the pain medication this morning.  "I know I called thinking that I may not be able to proceed due to my ear infection.  I still called you this afternoon and it took you until now to call me back!"  I had to get instruction from Dr. Paulla Dolly.  I am not at liberty to prescribe pain medication.  "Are you sure you even asked him?  He was aware that I was going to have to take Melburn Popper, both my daughter and I.  I'm going to have to pay an extra $10 for Melburn Popper to take me to the pharmacy and I am not trying to do that."  Yes, I spoke to Dr. Paulla Dolly.  I gave him your message.  "Well I want this taken care of and I want it sent to [facility] [address] in [place] so I can pick it up by 9 pm tonight."  I'll let Dr. Paulla Dolly know.  I cannot guarantee anything.  "Well it's the least you can do, you left a message this morning at the wrong phone number.  If I hadn't called, I never would have found out."  Yes, you would have found out.  I realized my error.  I did not get a response from Dr. Paulla Dolly this morning.  He gave it to me this afternoon.  I called you with the information as soon as I found out.  I will see what Dr. Paulla Dolly says and I will call you back with a response.  I am calling to let you know that Dr. Paulla Dolly does not do electronic prescriptions.  He said he doesn't normally give patients prescriptions for narcotics prior to  surgery.  However, I did ask Dr. March Rummage to do it electronically for Dr. Paulla Dolly.  He sent your prescription to [facility] as you requested.  "Well I appreciate you taking care of that.  I just hung up with the surgical center.  I'm not even sure Dr. Paulla Dolly is aware that I may have a sinus infection.  Was he even informed about the sinus infection.  He's prescribing an IV antibiotic, I think that's mainly for my foot.  Does he think that will help with my sinus problem?"  Dr. Paulla Dolly is aware of your message.  I dictated your message word for word and presented it to Dr. Paulla Dolly.  "He said the IV antibiotic would be enough, I wouldn't need a prescription for an antibiotic?"  Yes, that is correct.  "Okay, I was a little hesitant about having this done.  Well thank you for your help.  I really appreciate it."

## 2017-07-31 DIAGNOSIS — L852 Keratosis punctata (palmaris et plantaris): Secondary | ICD-10-CM | POA: Diagnosis not present

## 2017-07-31 DIAGNOSIS — M2041 Other hammer toe(s) (acquired), right foot: Secondary | ICD-10-CM | POA: Diagnosis not present

## 2017-07-31 NOTE — Telephone Encounter (Signed)
Message sent to Dr. Tamala Julian.  Hi [facility].

## 2017-08-01 ENCOUNTER — Telehealth: Payer: Self-pay | Admitting: Family Medicine

## 2017-08-01 NOTE — Telephone Encounter (Signed)
Call to touch base with patient; if she is still having pain in her ear, I am happy to see her today.

## 2017-08-01 NOTE — Telephone Encounter (Addendum)
FYI: Dr Jerilynn Mages, Pt has an appointment to see you on 7/5. She wants you to know that she had a Physical with Dr Steffanie Dunn Smith([facility]) on June 3rd if you wanted to look at her notes before her visit.

## 2017-08-02 NOTE — Telephone Encounter (Signed)
Message retrieved 7/4 - pt has appt at [facility] 7/5.

## 2017-08-03 ENCOUNTER — Encounter: Payer: Self-pay | Admitting: Family Medicine

## 2017-08-03 ENCOUNTER — Ambulatory Visit: Payer: BC Managed Care – PPO | Admitting: Family Medicine

## 2017-08-03 VITALS — BP 120/83 | HR 79 | Ht 60.0 in | Wt 192.0 lb

## 2017-08-03 DIAGNOSIS — R350 Frequency of micturition: Secondary | ICD-10-CM | POA: Diagnosis not present

## 2017-08-03 DIAGNOSIS — T402X5A Adverse effect of other opioids, initial encounter: Secondary | ICD-10-CM | POA: Diagnosis not present

## 2017-08-03 DIAGNOSIS — K5903 Drug induced constipation: Secondary | ICD-10-CM

## 2017-08-03 DIAGNOSIS — J4540 Moderate persistent asthma, uncomplicated: Secondary | ICD-10-CM | POA: Diagnosis not present

## 2017-08-03 DIAGNOSIS — R7301 Impaired fasting glucose: Secondary | ICD-10-CM | POA: Diagnosis not present

## 2017-08-03 DIAGNOSIS — H9202 Otalgia, left ear: Secondary | ICD-10-CM

## 2017-08-03 NOTE — Progress Notes (Signed)
Subjective:    Patient ID: [name], female    DOB: 05/06/1950, 67 y.o.   MRN: [id number]  HPI  67 year old female is here to establish care.  She is been followed by Dr. Chaya Jan at [facility] in [place].  In fact she just had a routine physical 1 month ago with Dr. Tamala Julian.  He has a history of asthma, chronic sinusitis, ADD, bilateral osteoarthritis of the knees, dyslipidemia, and depression.  She recently treated for ear pain.  She normally follows with Dr. Claria Dice but he was out of town that week and had taken a 10-day prescription of Augmentin that Dr. Tamala Julian had given her on June 14.  He would like me to recheck her ear today.  It is feeling better.  She was just concerned because her pain in her ear had returned right before her foot surgery and wanted to make sure it was safe to have surgery.  She also wanted to give me an update that she is actually going to be having surgery on her left shoulder for shoulder replacement.  She has significant degeneration and failed physical therapy.  She is seeing Dr. Victorino December at [facility].  She is hoping to get the surgery done this summer.  She actually just had foot surgery on Tuesday of last week on her right foot.  The bone was actually starting to press in a row through the skin which was going to increase her risk for an infection so they wanted her to have that done before having the surgery on her shoulder done.  She has been diligent about keeping the foot elevated to help reduce swelling.  Daughter has been checking the capillary refill in her toes to make sure that the bandage is not too tight as they are not to remove it until a follow-up with the podiatrist for them the surgery.  She also complains of ear itching.  She does use Q-tips to remove a lot of wax.  She says sometimes she will even cough when she tries to clean her ears with a Q-tip.  She also would like a copy of her labs that were done in June  with Dr. Tamala Julian for her physical exam.   Review of Systems      Ht 5' (1.524 m)   LMP 08/30/2008   BMI 35.54 kg/m     Allergies  Allergen Reactions  . Aspirin     Other reaction(s): GI Intolerance History of ulcers  . Bee Venom     Any insects that bite or stings  . Nsaids Other (See Comments)    History of ulcers    Past Medical History:  Diagnosis Date  . ADD (attention deficit disorder)   . Arthritis    "bunion on  right" (08/11/2014)  . Chronic bronchitis ([place])    "plenty; but not q yr" (08/11/2014)  . Depression   . Dyslipidemia   . Environmental allergies   . GERD (gastroesophageal reflux disease)   . Hearing difficulty of left ear    "grew up w/deaf parent; find myself lip reading more recently" (08/11/2014)  . Hepatitis    "exposed in college; rec'd gamma globulin; 6 months later S/S (weakness, lethargy, fevers); ; dx'd infectious hepatitis"  . History of herpes genitalis    "stress induced reoccurance that shows up on my left middle finger" (08/11/2014)  . History of hiatal hernia   . History of stomach ulcers   . Hypercholesterolemia   .  Hyperthyroidism 1994   "postpartum only; resolved itself"  . Incisional hernia   . Internal hemorrhoids   . Leg cramping   . Osteopenia   . Seasonal asthma   . Stress incontinence, female   . Ulcer     Past Surgical History:  Procedure Laterality Date  . CESAREAN SECTION  1994  . CHALAZION EXCISION Left ~ 2013  . DILATION AND CURETTAGE OF UTERUS  X 2  . EYE SURGERY    . HERNIA REPAIR  08/11/2014  . INCISIONAL HERNIA REPAIR N/A 08/11/2014   Procedure: LAPAROSCOPIC INCISIONAL HERNIA REPAIR;  Surgeon: Coralie Keens, MD;  Location: [facility];  Service: General;  Laterality: N/A;  . INCONTINENCE SURGERY  2005  . INGUINAL HERNIA REPAIR Left 08/11/2014  . INGUINAL HERNIA REPAIR Left 08/11/2014   Procedure: LEFT INGUINAL HERNIA REPAIR;  Surgeon: Coralie Keens, MD;  Location: [facility];  Service: General;  Laterality: Left;  .  INSERTION OF MESH Left 08/11/2014   Procedure: INSERTION OF MESH TO LEFT GROIN AND ABDOMEN;  Surgeon: Coralie Keens, MD;  Location: [facility];  Service: General;  Laterality: Left;  . LAPAROSCOPIC INCISIONAL / UMBILICAL / VENTRAL HERNIA REPAIR  08/11/2014   [facility]  . NASAL POLYP EXCISION  2014  . REPAIR OF PERFORATED ULCER  1985  . TONSILLECTOMY      Social History   Socioeconomic History  . Marital status: Divorced    Spouse name: Not on file  . Number of children: Not on file  . Years of education: Not on file  . Highest education level: Not on file  Occupational History  . Occupation: Consulting civil engineer    Comment: Full time; Barista  Social Needs  . Financial resource strain: Not on file  . Food insecurity:    Worry: Not on file    Inability: Not on file  . Transportation needs:    Medical: Not on file    Non-medical: Not on file  Tobacco Use  . Smoking status: Never Smoker  . Smokeless tobacco: Never Used  Substance and Sexual Activity  . Alcohol use: Yes    Alcohol/week: 1.2 oz    Types: 1 Glasses of wine, 1 Shots of liquor per week    Comment: 08/11/2014 "0-2 glasses of wine or a mixed drink q wk"  . Drug use: No  . Sexual activity: Never    Birth control/protection: Post-menopausal  Lifestyle  . Physical activity:    Days per week: Not on file    Minutes per session: Not on file  . Stress: Not on file  Relationships  . Social connections:    Talks on phone: Not on file    Gets together: Not on file    Attends religious service: Not on file    Active member of club or organization: Not on file    Attends meetings of clubs or organizations: Not on file    Relationship status: Not on file  . Intimate partner violence:    Fear of current or ex partner: Not on file    Emotionally abused: Not on file    Physically abused: Not on file    Forced sexual activity: Not on file  Other Topics Concern  . Not on file  Social History Narrative    Marital status: divorced; not dating      Children: 1 daughter; no grandchildren      Lives:        Employment: Optometrist  Tobacco:       Alcohol:      Drugs:       Exercise:     Family History  Problem Relation Age of Onset  . Arthritis Mother   . Heart disease Mother        CABG @ 18  . Diabetes Mother   . Osteoporosis Mother   . Hyperlipidemia Mother   . Arthritis Father   . Lymphoma Father 19       chemo  . Cancer Father        lymphoma  . Lymphoma Paternal Grandmother   . Diabetes Maternal Grandmother     Outpatient Encounter Medications as of 08/03/2017  Medication Sig  . acetaminophen (TYLENOL) 650 MG CR tablet Take 1,300 mg by mouth every 8 (eight) hours as needed for pain.  Marland Kitchen albuterol (PROAIR HFA) 108 (90 Base) MCG/ACT inhaler INHALE 2 PUFFS INTO THE LUNGS EVERY 6 (SIX) HOURS AS NEEDED *EMERGENCY FILL*  . amphetamine-dextroamphetamine (ADDERALL) 20 MG tablet Take 1 tablet (20 mg total) by mouth 2 (two) times daily.  . cyclobenzaprine (FLEXERIL) 10 MG tablet Take 1 tablet (10 mg total) by mouth 2 (two) times daily as needed for muscle spasms.  . DULoxetine (CYMBALTA) 30 MG capsule Take 1 capsule (30 mg total) by mouth daily.  . DULoxetine (CYMBALTA) 60 MG capsule Take 1 capsule (60 mg total) by mouth daily.  Marland Kitchen esomeprazole (NEXIUM) 40 MG capsule Take 1 capsule (40 mg total) by mouth daily at 12 noon.  Marland Kitchen HYDROcodone-acetaminophen (NORCO) 10-325 MG tablet Take 1 tablet by mouth every 4 (four) hours as needed.  Marland Kitchen ipratropium (ATROVENT) 0.03 % nasal spray Place 2 sprays into the nose 4 (four) times daily. Use as needed for runny nose and congestion  . loratadine (CLARITIN) 10 MG tablet Take 1 tablet (10 mg total) by mouth daily.  . Magnesium 500 MG CAPS Take by mouth.  . mometasone (NASONEX) 50 MCG/ACT nasal spray INHALE 2 SPRAYS INTO THE NOSE DAILY AS NEEDED  . simvastatin (ZOCOR) 40 MG tablet Take 1 tablet (40 mg total) by mouth daily.  . [DISCONTINUED]  amoxicillin-clavulanate (AUGMENTIN) 875-125 MG tablet Take 1 tablet by mouth 2 (two) times daily.  . [DISCONTINUED] benzonatate (TESSALON) 100 MG capsule Take 1-2 capsules (100-200 mg total) by mouth 3 (three) times daily as needed for cough.   No facility-administered encounter medications on file as of 08/03/2017.       Objective:   Physical Exam  Constitutional: She is oriented to person, place, and time. She appears well-developed and well-nourished.  HENT:  Head: Normocephalic and atraumatic.  Right Ear: External ear normal.  Left Ear: External ear normal.  TMs and canals are clear bilaterally.  Eyes: Conjunctivae are normal.  Cardiovascular: Normal rate, regular rhythm and normal heart sounds.  Pulmonary/Chest: Effort normal and breath sounds normal.  Musculoskeletal:  Right foot is in a postop shoe.  I did check the capillary refill in all of her toes on the right foot which is normal.  She is not supposed to remove the bandage until she sees the podiatrist back.  They are swollen but have good blood flow.  Neurological: She is alert and oriented to person, place, and time.  Skin: Skin is warm and dry.  Psychiatric: She has a normal mood and affect. Her behavior is normal.       Assessment & Plan:   Left ear pain - Gave reassurance that ear exam is normal today.  F/U  With Dr. Ernesto Rutherford if needed.   Therapeutic opioid induced constipation -commended trial of over-the-counter MiraLAX.  1 capful at bedtime as needed.  Right shoulder pain-planning on surgery this summer.  Ear itching-discussed importance of not removing too much wax from the ears as this can cause the skin to dry out and itch.  Sometimes deep ear itching can actually be allergic rhinitis as well.  Printed a copy of her lab work from her physical from June.  An A1c was in the prediabetes range which it has been previously.  We will add diagnosis to chart and will monitor every 6 months.  Asthma  -we discussed  making sure that she is using her Advair daily instead of as needed.  Especially if she recently had a cold or respiratory infection.  Explained that often study showed benefit to continue daily use for 1 month.  This helps reduce the inflammation of the bronchial tubes.  She said she used her inhaler this morning but was clear by the time she was here today.  Urinary frequency -recent urinalysis was normal.  Per patient preference place referral to urology in [place].

## 2017-08-07 ENCOUNTER — Encounter: Payer: Self-pay | Admitting: Family Medicine

## 2017-08-07 ENCOUNTER — Encounter: Payer: Self-pay | Admitting: Podiatry

## 2017-08-07 ENCOUNTER — Ambulatory Visit (INDEPENDENT_AMBULATORY_CARE_PROVIDER_SITE_OTHER): Payer: Self-pay | Admitting: Podiatry

## 2017-08-07 ENCOUNTER — Ambulatory Visit (INDEPENDENT_AMBULATORY_CARE_PROVIDER_SITE_OTHER): Payer: BC Managed Care – PPO

## 2017-08-07 VITALS — BP 110/65 | HR 88 | Temp 97.6°F | Resp 16

## 2017-08-07 DIAGNOSIS — M2041 Other hammer toe(s) (acquired), right foot: Secondary | ICD-10-CM | POA: Diagnosis not present

## 2017-08-07 LAB — HM DEXA SCAN

## 2017-08-07 MED ORDER — HYDROCODONE-ACETAMINOPHEN 10-325 MG PO TABS: 1.0000 | ORAL_TABLET | Freq: Three times a day (TID) | ORAL | 0 refills | Status: DC | PRN

## 2017-08-08 NOTE — Progress Notes (Signed)
Subjective:   Patient ID: [name], female   DOB: 67 y.o.   MRN: [id number]   HPI Patient presents stating I am doing really well with surgery with minimal discomfort   ROS      Objective:  Physical Exam  Neurovascular status intact with patient's right foot healing well with wound edges well coapted toes in good alignment and no drainage noted     Assessment:  Doing well post partial syndactylization arthroplasty digit 5 right fourth interspace     Plan:  X-ray performed advised on continued compression immobilization sterile dressing reapplied and reappoint 2 weeks suture removal or earlier if needed  X-ray indicates satisfactory resection had a proximal phalanx digit 5 right

## 2017-08-09 ENCOUNTER — Other Ambulatory Visit: Payer: BC Managed Care – PPO

## 2017-08-13 ENCOUNTER — Telehealth: Payer: Self-pay | Admitting: Family Medicine

## 2017-08-13 NOTE — Telephone Encounter (Signed)
Left VM for Pt to return clinic call regarding results, callback information provided. 

## 2017-08-13 NOTE — Telephone Encounter (Signed)
Please call patient: I did receive her bone density test scores.  Her T score is -1.6 which is consistent with osteopenia which is means mildly thin bones.  Recommend a follow-up bone density test in 2 years.  Make sure just continuing to get adequate calcium with vitamin D daily in addition to regular weightbearing exercise.

## 2017-08-13 NOTE — Telephone Encounter (Signed)
Patient advised of recommendations.  

## 2017-08-14 ENCOUNTER — Other Ambulatory Visit: Payer: Self-pay | Admitting: Ophthalmology

## 2017-08-14 LAB — HM MAMMOGRAPHY

## 2017-08-21 ENCOUNTER — Telehealth: Payer: Self-pay | Admitting: Family Medicine

## 2017-08-21 NOTE — Telephone Encounter (Signed)
Call patient --- bone density scan results show OSTEOPENIA (but no osteoporosis).  Current guidelines recommend 1.  Calcium 600mg  twice daily.  2.  Vitamin D 800 IU daily.  3.  Weight-bearing exercise for 30 minutes daily. 4. Repeat bone density scan in two years.

## 2017-08-22 ENCOUNTER — Ambulatory Visit (INDEPENDENT_AMBULATORY_CARE_PROVIDER_SITE_OTHER): Payer: Self-pay | Admitting: Podiatry

## 2017-08-22 ENCOUNTER — Ambulatory Visit (INDEPENDENT_AMBULATORY_CARE_PROVIDER_SITE_OTHER): Payer: BC Managed Care – PPO

## 2017-08-22 DIAGNOSIS — M2041 Other hammer toe(s) (acquired), right foot: Secondary | ICD-10-CM | POA: Diagnosis not present

## 2017-08-22 DIAGNOSIS — M205X1 Other deformities of toe(s) (acquired), right foot: Secondary | ICD-10-CM

## 2017-08-22 DIAGNOSIS — M779 Enthesopathy, unspecified: Secondary | ICD-10-CM

## 2017-08-23 NOTE — Telephone Encounter (Signed)
Spoke with patient advised of results Voiced understanding  Patient states she will be dropping a surgical form off to be signed states she talk to Dr. Tamala Julian about this at her 6/3 visit and is aware it will not be filled out when she drops it off.

## 2017-08-23 NOTE — Progress Notes (Signed)
Subjective:   Patient ID: [name], female   DOB: 67 y.o.   MRN: [id number]   HPI Patient presents stating she is doing well with the right foot and has had no drainage and is taking care of it   ROS      Objective:  Physical Exam  Neurovascular status intact with patient's fourth interspace healing well with tissue crusted over and no indications of advanced pathology with no opening or gapping.  The fifth toe is hanging out slightly but that is just the nature of her foot structure and nothing that appears to be a problem     Assessment:  Doing well from soft tissue partial syndactylization right with arthroplasty fifth digit right     Plan:  I advised this patient on continuing to keep it dry stitches removed wound edges are coapted well and explained how to keep this clean and that will take several more weeks to heal of any redness or drainage were to occur to let us know immediately  X-ray indicates that there is satisfactory resection of bone with good alignment noted

## 2017-08-24 ENCOUNTER — Encounter: Payer: Self-pay | Admitting: Family Medicine

## 2017-08-31 NOTE — Telephone Encounter (Signed)
Patient calling to check on the status of her surgical clearance form that she had dropped of for Dr Tamala Julian on 08/24/17. States that it is already signed by Dr Victorino December. Once this is filled out, she requests it be faxed to [phone] Attn: Orson Slick. Patient also states that she is going to call Dr Stann Mainland to see if they will fax the paper incase it was misplaced. CB#: [phone]

## 2017-09-01 ENCOUNTER — Encounter: Payer: Self-pay | Admitting: Family Medicine

## 2017-09-03 NOTE — Telephone Encounter (Signed)
I have completed surgical clearance form for pt; please refax.   [facility] should have a copy.

## 2017-09-06 ENCOUNTER — Telehealth: Payer: Self-pay | Admitting: Family Medicine

## 2017-09-06 ENCOUNTER — Telehealth: Payer: Self-pay

## 2017-09-06 ENCOUNTER — Encounter: Payer: Self-pay | Admitting: Family Medicine

## 2017-09-06 NOTE — Telephone Encounter (Signed)
Completed and signed for given to you. Please send with Dr Thompson Caul note from 07/02/17, labs and ekg done that day. Also this note as it states Dr Thompson Caul had no concerns. Thanks

## 2017-09-06 NOTE — Telephone Encounter (Signed)
Copied from [facility] [phone]. Topic: General - Other >> Sep 06, 2017  5:04 PM Cecelia Byars, NT wrote: Reason for CRM: Patient called and said Delila Spence from [facility] has faxed over a copy of the forms for the patients  surgery clearance please fax to attention Delila Spence  [phone]  once completed. The patient originally dropped the paper work off on  08/23/17 please advise [phone] .

## 2017-09-07 NOTE — Telephone Encounter (Signed)
All documentation was Faxed today and received confirmation.

## 2017-09-07 NOTE — Telephone Encounter (Signed)
Pt called again and would like this faxed over to office today.  She is concerned that this is not going to be completed today.  [facility] is needing this info today in order sch pt.  Pt is frustrated that this has not happen yet   Cecille Rubin fax is [phone]-  Refax to this number today

## 2017-09-10 ENCOUNTER — Encounter (HOSPITAL_COMMUNITY)
Admission: RE | Admit: 2017-09-10 | Discharge: 2017-09-10 | Disposition: A | Discharge status: Home / Self Care | Payer: BC Managed Care – PPO | Source: Ambulatory Visit | Attending: Orthopedic Surgery | Admitting: Orthopedic Surgery

## 2017-09-10 ENCOUNTER — Ambulatory Visit (INDEPENDENT_AMBULATORY_CARE_PROVIDER_SITE_OTHER): Payer: BC Managed Care – PPO

## 2017-09-10 ENCOUNTER — Other Ambulatory Visit: Payer: Self-pay

## 2017-09-10 ENCOUNTER — Encounter (HOSPITAL_COMMUNITY): Payer: Self-pay

## 2017-09-10 DIAGNOSIS — Z01812 Encounter for preprocedural laboratory examination: Secondary | ICD-10-CM | POA: Insufficient documentation

## 2017-09-10 DIAGNOSIS — M2041 Other hammer toe(s) (acquired), right foot: Secondary | ICD-10-CM

## 2017-09-10 LAB — CBC
HCT: 40.8 % (ref 36.0–46.0)
Hemoglobin: 12.9 g/dL (ref 12.0–15.0)
MCH: 28.4 pg (ref 26.0–34.0)
MCHC: 31.6 g/dL (ref 30.0–36.0)
MCV: 89.9 fL (ref 78.0–100.0)
PLATELETS: 338 10*3/uL (ref 150–400)
RBC: 4.54 MIL/uL (ref 3.87–5.11)
RDW: 14.7 % (ref 11.5–15.5)
WBC: 6.7 10*3/uL (ref 4.0–10.5)

## 2017-09-10 LAB — BASIC METABOLIC PANEL
Anion gap: 9 (ref 5–15)
BUN: 10 mg/dL (ref 8–23)
CHLORIDE: 103 mmol/L (ref 98–111)
CO2: 26 mmol/L (ref 22–32)
CREATININE: 0.78 mg/dL (ref 0.44–1.00)
Calcium: 9.2 mg/dL (ref 8.9–10.3)
GFR calc non Af Amer: >60 mL/min (ref >60)
Glucose, Bld: 103 mg/dL — ABNORMAL HIGH (ref 70–99)
Potassium: 4.3 mmol/L (ref 3.5–5.1)
Sodium: 138 mmol/L (ref 135–145)

## 2017-09-10 LAB — SURGICAL PCR SCREEN
MRSA, PCR: NEGATIVE
Staphylococcus aureus: NEGATIVE

## 2017-09-10 NOTE — Pre-Procedure Instructions (Signed)
[name]  09/10/2017      [facility] [place] [place] [address] [place] [address] Phone: [phone] Fax: [phone]  [facility] [place] [address] [place] [address] Phone: [phone] Fax: [place], [place] [address] [place] [address] Phone: [phone] Fax: [phone]    Your procedure is scheduled on September 18, 2017.  Report to [facility] Admitting at 1030 AM.  Call this number if you have problems the morning of surgery:  [phone]   Remember:  Do not eat or drink after midnight.    Take these medicines the morning of surgery with A SIP OF WATER  Tylenol-if needed Albuterol inhaler-if needed (bring inhaler with you) Duloxetine (cymbalta) Atrovent nasal spray-if needed loratidine (claritin) Nasonex nasal spray-if needed Hydrocodone-acetaminophen (norco)-if needed   7 days prior to surgery STOP taking any Aspirin containing products,  Aleve, Naproxen, Ibuprofen, Motrin, Advil, Goody's, BC's, all herbal medications, fish oil, and all vitamins    Do not wear jewelry, make-up or nail polish.  Do not wear lotions, powders, or perfumes, or deodorant.  Do not shave 48 hours prior to surgery.    Do not bring valuables to the hospital.  [facility] is not responsible for any belongings or valuables.  Contacts, dentures or bridgework may not be worn into surgery.  Leave your suitcase in the car.  After surgery it may be brought to your room.  For patients admitted to the hospital, discharge time will be determined by your treatment team.  Patients discharged the day of surgery will not be allowed to drive home.   [facility]- Preparing For Surgery  Before surgery, you can play an important role. Because  skin is not sterile, your skin needs to be as free of germs as possible. You can reduce the number of germs on your skin by washing with CHG (chlorahexidine gluconate) Soap before surgery.  CHG is an antiseptic cleaner which kills germs and bonds with the skin to continue killing germs even after washing.    Oral Hygiene is also important to reduce your risk of infection.  Remember - BRUSH YOUR TEETH THE MORNING OF SURGERY WITH YOUR REGULAR TOOTHPASTE  Please do not use if you have an allergy to CHG or antibacterial soaps. If your skin becomes reddened/irritated stop using the CHG.  Do not shave (including legs and underarms) for at least 48 hours prior to first CHG shower. It is OK to shave your face.  Please follow these instructions carefully.   1. Shower the NIGHT BEFORE SURGERY and the MORNING OF SURGERY with CHG.   2. If you chose to wash your hair, wash your hair first as usual with your normal shampoo.  3. After you shampoo, rinse your hair and body thoroughly to remove the shampoo.  4. Use CHG as you would any other liquid soap. You can apply CHG directly to the skin and wash gently with a scrungie or a clean washcloth.   5. Apply the CHG Soap to your body ONLY FROM THE NECK DOWN.  Do not use on open wounds or open sores. Avoid contact with your eyes, ears, mouth and genitals (private parts). Wash Face and genitals (private parts)  with  your normal soap.  6. Wash thoroughly, paying special attention to the area where your surgery will be performed.  7. Thoroughly rinse your body with warm water from the neck down.  8. DO NOT shower/wash with your normal soap after using and rinsing off the CHG Soap.  9. Pat yourself dry with a CLEAN TOWEL.  10. Wear CLEAN PAJAMAS to bed the night before surgery, wear comfortable clothes the morning of surgery  11. Place CLEAN SHEETS on your bed the night of your first shower and DO NOT SLEEP WITH PETS.  Day of Surgery:  Do not apply any  deodorants/lotions.  Please wear clean clothes to the hospital/surgery center.   Remember to brush your teeth WITH YOUR REGULAR TOOTHPASTE.   Please read over the following fact sheets that you were given.

## 2017-09-10 NOTE — Progress Notes (Addendum)
PCP: Beatrice Lecher, MD  Cardiologist: pt denies  EKG: 07/02/17 in EPIC  Stress test: pt denies  ECHO: pt denies  Cardiac Cath: pt denies  Chest x-ray: pt denies past year, no recent respiratory infections

## 2017-09-12 ENCOUNTER — Other Ambulatory Visit (HOSPITAL_COMMUNITY): Payer: BC Managed Care – PPO

## 2017-09-17 MED ORDER — TRANEXAMIC ACID 1000 MG/10ML IV SOLN
1000.0000 mg | INTRAVENOUS | Status: AC
  Filled 2017-09-17: qty 10

## 2017-09-18 NOTE — Progress Notes (Signed)
Patient presents status post hammertoe repair fifth right, webbing procedure fourth right.  Date of surgery 07/31/2017.  She states that she feels like there is still a suture in her toe, overall she says her toe feels good, and she is not having any pain with it at this time.  Removed the remaining suture.  Noted well-healing surgical site.  No redness, no erythema, no drainage, no other signs and symptoms of infection.  Wound edges coapted aligned and approximated.  No gapping noted.  She has very minimal swelling to the toe at this time.  She is going to have shoulder surgery in the very near future, and I advised her to follow-up about her right foot, as soon as she is able to get out and about post shoulder surgery or sooner if complications arise.

## 2017-09-18 NOTE — Telephone Encounter (Signed)
Spoke with pt this morning and she advised me that she has ben schedule for her Surgery and all paper work was completed.

## 2017-09-21 ENCOUNTER — Encounter (HOSPITAL_COMMUNITY): Payer: Self-pay

## 2017-09-21 ENCOUNTER — Inpatient Hospital Stay (HOSPITAL_COMMUNITY)
Admission: RE | Admit: 2017-09-21 | Discharge: 2017-09-23 | DRG: 483 | Disposition: A | Discharge status: Home / Self Care | Payer: BC Managed Care – PPO | Source: Ambulatory Visit | Attending: Orthopedic Surgery | Admitting: Orthopedic Surgery

## 2017-09-21 ENCOUNTER — Inpatient Hospital Stay (HOSPITAL_COMMUNITY): Payer: BC Managed Care – PPO | Admitting: Anesthesiology

## 2017-09-21 ENCOUNTER — Inpatient Hospital Stay (HOSPITAL_COMMUNITY): Payer: BC Managed Care – PPO

## 2017-09-21 ENCOUNTER — Encounter (HOSPITAL_COMMUNITY)
Admission: RE | Disposition: A | Discharge status: Home / Self Care | Payer: Self-pay | Source: Ambulatory Visit | Attending: Orthopedic Surgery

## 2017-09-21 DIAGNOSIS — Z91030 Bee allergy status: Secondary | ICD-10-CM

## 2017-09-21 DIAGNOSIS — F329 Major depressive disorder, single episode, unspecified: Secondary | ICD-10-CM | POA: Diagnosis present

## 2017-09-21 DIAGNOSIS — Z8619 Personal history of other infectious and parasitic diseases: Secondary | ICD-10-CM

## 2017-09-21 DIAGNOSIS — Z79899 Other long term (current) drug therapy: Secondary | ICD-10-CM | POA: Diagnosis not present

## 2017-09-21 DIAGNOSIS — Z8711 Personal history of peptic ulcer disease: Secondary | ICD-10-CM | POA: Diagnosis not present

## 2017-09-21 DIAGNOSIS — E785 Hyperlipidemia, unspecified: Secondary | ICD-10-CM | POA: Diagnosis present

## 2017-09-21 DIAGNOSIS — K219 Gastro-esophageal reflux disease without esophagitis: Secondary | ICD-10-CM | POA: Diagnosis present

## 2017-09-21 DIAGNOSIS — Z6835 Body mass index (BMI) 35.0-35.9, adult: Secondary | ICD-10-CM | POA: Diagnosis not present

## 2017-09-21 DIAGNOSIS — F988 Other specified behavioral and emotional disorders with onset usually occurring in childhood and adolescence: Secondary | ICD-10-CM | POA: Diagnosis present

## 2017-09-21 DIAGNOSIS — J45909 Unspecified asthma, uncomplicated: Secondary | ICD-10-CM | POA: Diagnosis present

## 2017-09-21 DIAGNOSIS — H9192 Unspecified hearing loss, left ear: Secondary | ICD-10-CM | POA: Diagnosis present

## 2017-09-21 DIAGNOSIS — M19012 Primary osteoarthritis, left shoulder: Principal | ICD-10-CM | POA: Diagnosis present

## 2017-09-21 DIAGNOSIS — J42 Unspecified chronic bronchitis: Secondary | ICD-10-CM | POA: Diagnosis present

## 2017-09-21 DIAGNOSIS — E669 Obesity, unspecified: Secondary | ICD-10-CM | POA: Diagnosis present

## 2017-09-21 DIAGNOSIS — Z886 Allergy status to analgesic agent status: Secondary | ICD-10-CM | POA: Diagnosis not present

## 2017-09-21 DIAGNOSIS — Z96612 Presence of left artificial shoulder joint: Secondary | ICD-10-CM

## 2017-09-21 DIAGNOSIS — M8580 Other specified disorders of bone density and structure, unspecified site: Secondary | ICD-10-CM | POA: Diagnosis present

## 2017-09-21 HISTORY — PX: TOTAL SHOULDER ARTHROPLASTY: SHX126

## 2017-09-21 LAB — CBC
HCT: 37.1 % (ref 36.0–46.0)
HEMOGLOBIN: 11.7 g/dL — AB (ref 12.0–15.0)
MCH: 28.6 pg (ref 26.0–34.0)
MCHC: 31.5 g/dL (ref 30.0–36.0)
MCV: 90.7 fL (ref 78.0–100.0)
Platelets: 329 10*3/uL (ref 150–400)
RBC: 4.09 MIL/uL (ref 3.87–5.11)
RDW: 14.7 % (ref 11.5–15.5)
WBC: 11.5 10*3/uL — ABNORMAL HIGH (ref 4.0–10.5)

## 2017-09-21 LAB — CREATININE, SERUM
Creatinine, Ser: 0.8 mg/dL (ref 0.44–1.00)
GFR calc non Af Amer: >60 mL/min (ref >60)

## 2017-09-21 SURGERY — ARTHROPLASTY, SHOULDER, TOTAL
Anesthesia: General | Site: Shoulder | Laterality: Left

## 2017-09-21 MED ORDER — PHENYLEPHRINE 40 MCG/ML (10ML) SYRINGE FOR IV PUSH (FOR BLOOD PRESSURE SUPPORT)
PREFILLED_SYRINGE | INTRAVENOUS | Status: AC
  Filled 2017-09-21: qty 10

## 2017-09-21 MED ORDER — CEFAZOLIN SODIUM-DEXTROSE 1-4 GM/50ML-% IV SOLN
1.0000 g | Freq: Four times a day (QID) | INTRAVENOUS | Status: AC
  Administered 2017-09-21 – 2017-09-22 (×3): 1 g via INTRAVENOUS
  Filled 2017-09-21 (×3): qty 50

## 2017-09-21 MED ORDER — EPHEDRINE 5 MG/ML INJ
INTRAVENOUS | Status: AC
  Filled 2017-09-21: qty 10

## 2017-09-21 MED ORDER — MEPERIDINE HCL 50 MG/ML IJ SOLN: 6.2500 mg | INTRAMUSCULAR | Status: DC | PRN

## 2017-09-21 MED ORDER — METHOCARBAMOL 500 MG PO TABS
500.0000 mg | ORAL_TABLET | Freq: Four times a day (QID) | ORAL | Status: DC | PRN
  Administered 2017-09-22 – 2017-09-23 (×4): 500 mg via ORAL
  Filled 2017-09-21 (×4): qty 1

## 2017-09-21 MED ORDER — MIDAZOLAM HCL 2 MG/2ML IJ SOLN
2.0000 mg | Freq: Once | INTRAMUSCULAR | Status: AC
  Administered 2017-09-21: 2 mg via INTRAVENOUS

## 2017-09-21 MED ORDER — HYDROMORPHONE HCL 1 MG/ML IJ SOLN: 0.2500 mg | INTRAMUSCULAR | Status: DC | PRN

## 2017-09-21 MED ORDER — LIDOCAINE 2% (20 MG/ML) 5 ML SYRINGE
INTRAMUSCULAR | Status: DC | PRN
  Administered 2017-09-21: 80 mg via INTRAVENOUS

## 2017-09-21 MED ORDER — SIMVASTATIN 40 MG PO TABS
40.0000 mg | ORAL_TABLET | Freq: Every day | ORAL | Status: DC
  Administered 2017-09-21: 40 mg via ORAL
  Filled 2017-09-21 (×2): qty 1

## 2017-09-21 MED ORDER — DULOXETINE HCL 30 MG PO CPEP: 30.0000 mg | ORAL_CAPSULE | Freq: Every day | ORAL | Status: DC

## 2017-09-21 MED ORDER — ONDANSETRON HCL 4 MG/2ML IJ SOLN
INTRAMUSCULAR | Status: AC
  Filled 2017-09-21: qty 2

## 2017-09-21 MED ORDER — ONDANSETRON HCL 4 MG/2ML IJ SOLN
INTRAMUSCULAR | Status: DC | PRN
  Administered 2017-09-21: 4 mg via INTRAVENOUS

## 2017-09-21 MED ORDER — FENTANYL CITRATE (PF) 250 MCG/5ML IJ SOLN
INTRAMUSCULAR | Status: AC
  Filled 2017-09-21: qty 5

## 2017-09-21 MED ORDER — METOCLOPRAMIDE HCL 5 MG/ML IJ SOLN: 5.0000 mg | Freq: Three times a day (TID) | INTRAMUSCULAR | Status: DC | PRN

## 2017-09-21 MED ORDER — EPHEDRINE SULFATE 50 MG/ML IJ SOLN
INTRAMUSCULAR | Status: DC | PRN
  Administered 2017-09-21 (×3): 10 mg via INTRAVENOUS

## 2017-09-21 MED ORDER — SODIUM CHLORIDE 0.9 % IV SOLN
INTRAVENOUS | Status: DC | PRN
  Administered 2017-09-21: 20 ug/min via INTRAVENOUS

## 2017-09-21 MED ORDER — DOCUSATE SODIUM 100 MG PO CAPS
100.0000 mg | ORAL_CAPSULE | Freq: Two times a day (BID) | ORAL | Status: DC
  Administered 2017-09-21 – 2017-09-23 (×4): 100 mg via ORAL
  Filled 2017-09-21 (×4): qty 1

## 2017-09-21 MED ORDER — ONDANSETRON HCL 4 MG PO TABS: 4.0000 mg | ORAL_TABLET | Freq: Four times a day (QID) | ORAL | Status: DC | PRN

## 2017-09-21 MED ORDER — GABAPENTIN 300 MG PO CAPS
300.0000 mg | ORAL_CAPSULE | Freq: Three times a day (TID) | ORAL | Status: DC
  Administered 2017-09-21 – 2017-09-23 (×6): 300 mg via ORAL
  Filled 2017-09-21 (×6): qty 1

## 2017-09-21 MED ORDER — AMPHETAMINE-DEXTROAMPHETAMINE 10 MG PO TABS
20.0000 mg | ORAL_TABLET | Freq: Two times a day (BID) | ORAL | Status: DC
  Filled 2017-09-21 (×3): qty 2

## 2017-09-21 MED ORDER — LACTATED RINGERS IV SOLN
INTRAVENOUS | Status: DC
  Administered 2017-09-21 (×2): via INTRAVENOUS

## 2017-09-21 MED ORDER — MIDAZOLAM HCL 5 MG/5ML IJ SOLN
INTRAMUSCULAR | Status: DC | PRN
  Administered 2017-09-21: 2 mg via INTRAVENOUS

## 2017-09-21 MED ORDER — PROPOFOL 10 MG/ML IV BOLUS
INTRAVENOUS | Status: DC | PRN
  Administered 2017-09-21: 200 mg via INTRAVENOUS

## 2017-09-21 MED ORDER — PHENOL 1.4 % MT LIQD: 1.0000 | OROMUCOSAL | Status: DC | PRN

## 2017-09-21 MED ORDER — LIDOCAINE 2% (20 MG/ML) 5 ML SYRINGE
INTRAMUSCULAR | Status: AC
  Filled 2017-09-21: qty 5

## 2017-09-21 MED ORDER — MIDAZOLAM HCL 2 MG/2ML IJ SOLN
INTRAMUSCULAR | Status: AC
  Administered 2017-09-21: 2 mg via INTRAVENOUS
  Filled 2017-09-21: qty 2

## 2017-09-21 MED ORDER — PROPOFOL 10 MG/ML IV BOLUS
INTRAVENOUS | Status: AC
  Filled 2017-09-21: qty 20

## 2017-09-21 MED ORDER — OXYCODONE HCL 5 MG PO TABS
5.0000 mg | ORAL_TABLET | ORAL | Status: DC | PRN
  Administered 2017-09-22 (×2): 10 mg via ORAL
  Administered 2017-09-23 (×3): 5 mg via ORAL
  Administered 2017-09-23: 10 mg via ORAL
  Filled 2017-09-21 (×2): qty 1
  Filled 2017-09-21: qty 2
  Filled 2017-09-21: qty 1
  Filled 2017-09-21: qty 2

## 2017-09-21 MED ORDER — ROPIVACAINE HCL 5 MG/ML IJ SOLN
INTRAMUSCULAR | Status: DC | PRN
  Administered 2017-09-21: 30 mL via PERINEURAL

## 2017-09-21 MED ORDER — GLYCOPYRROLATE PF 0.2 MG/ML IJ SOSY
PREFILLED_SYRINGE | INTRAMUSCULAR | Status: AC
  Filled 2017-09-21: qty 1

## 2017-09-21 MED ORDER — SENNOSIDES-DOCUSATE SODIUM 8.6-50 MG PO TABS
1.0000 | ORAL_TABLET | Freq: Every evening | ORAL | Status: DC | PRN
  Administered 2017-09-22: 1 via ORAL
  Filled 2017-09-21: qty 1

## 2017-09-21 MED ORDER — FENTANYL CITRATE (PF) 100 MCG/2ML IJ SOLN
INTRAMUSCULAR | Status: DC | PRN
  Administered 2017-09-21: 50 ug via INTRAVENOUS

## 2017-09-21 MED ORDER — METOCLOPRAMIDE HCL 5 MG PO TABS: 5.0000 mg | ORAL_TABLET | Freq: Three times a day (TID) | ORAL | Status: DC | PRN

## 2017-09-21 MED ORDER — PROMETHAZINE HCL 25 MG/ML IJ SOLN: 6.2500 mg | INTRAMUSCULAR | Status: DC | PRN

## 2017-09-21 MED ORDER — ONDANSETRON HCL 4 MG/2ML IJ SOLN: 4.0000 mg | Freq: Four times a day (QID) | INTRAMUSCULAR | Status: DC | PRN

## 2017-09-21 MED ORDER — FENTANYL CITRATE (PF) 100 MCG/2ML IJ SOLN
INTRAMUSCULAR | Status: AC
  Administered 2017-09-21: 100 ug via INTRAVENOUS
  Filled 2017-09-21: qty 2

## 2017-09-21 MED ORDER — AMPHETAMINE-DEXTROAMPHETAMINE 20 MG PO TABS
20.0000 mg | ORAL_TABLET | Freq: Two times a day (BID) | ORAL | Status: DC
  Filled 2017-09-21: qty 1

## 2017-09-21 MED ORDER — ACETAMINOPHEN 325 MG PO TABS
325.0000 mg | ORAL_TABLET | Freq: Four times a day (QID) | ORAL | Status: DC | PRN
  Administered 2017-09-22 – 2017-09-23 (×4): 650 mg via ORAL
  Filled 2017-09-21 (×4): qty 2

## 2017-09-21 MED ORDER — OXYCODONE HCL 5 MG PO TABS: 5.0000 mg | ORAL_TABLET | Freq: Once | ORAL | Status: DC | PRN

## 2017-09-21 MED ORDER — PHENYLEPHRINE HCL 10 MG/ML IJ SOLN
INTRAMUSCULAR | Status: DC | PRN
  Administered 2017-09-21 (×3): 80 ug via INTRAVENOUS

## 2017-09-21 MED ORDER — 0.9 % SODIUM CHLORIDE (POUR BTL) OPTIME
TOPICAL | Status: DC | PRN
  Administered 2017-09-21: 1000 mL

## 2017-09-21 MED ORDER — OXYCODONE HCL 5 MG/5ML PO SOLN: 5.0000 mg | Freq: Once | ORAL | Status: DC | PRN

## 2017-09-21 MED ORDER — HYDROMORPHONE HCL 1 MG/ML IJ SOLN: 0.5000 mg | INTRAMUSCULAR | Status: DC | PRN

## 2017-09-21 MED ORDER — DEXAMETHASONE SODIUM PHOSPHATE 10 MG/ML IJ SOLN
INTRAMUSCULAR | Status: DC | PRN
  Administered 2017-09-21: 10 mg via INTRAVENOUS

## 2017-09-21 MED ORDER — TRANEXAMIC ACID 1000 MG/10ML IV SOLN
INTRAVENOUS | Status: DC | PRN
  Administered 2017-09-21: 1000 mg via INTRAVENOUS

## 2017-09-21 MED ORDER — CHLORHEXIDINE GLUCONATE 4 % EX LIQD: 60.0000 mL | Freq: Once | CUTANEOUS | Status: DC

## 2017-09-21 MED ORDER — FENTANYL CITRATE (PF) 100 MCG/2ML IJ SOLN
100.0000 ug | Freq: Once | INTRAMUSCULAR | Status: AC
  Administered 2017-09-21: 100 ug via INTRAVENOUS

## 2017-09-21 MED ORDER — LORATADINE 10 MG PO TABS
10.0000 mg | ORAL_TABLET | Freq: Every day | ORAL | Status: DC
  Administered 2017-09-22 – 2017-09-23 (×2): 10 mg via ORAL
  Filled 2017-09-21 (×2): qty 1

## 2017-09-21 MED ORDER — CEFAZOLIN SODIUM-DEXTROSE 2-4 GM/100ML-% IV SOLN
2.0000 g | INTRAVENOUS | Status: AC
  Administered 2017-09-21: 2 g via INTRAVENOUS

## 2017-09-21 MED ORDER — DULOXETINE HCL 60 MG PO CPEP
60.0000 mg | ORAL_CAPSULE | Freq: Every day | ORAL | Status: DC
  Administered 2017-09-22 – 2017-09-23 (×2): 60 mg via ORAL
  Filled 2017-09-21 (×3): qty 1

## 2017-09-21 MED ORDER — DEXAMETHASONE SODIUM PHOSPHATE 10 MG/ML IJ SOLN
INTRAMUSCULAR | Status: AC
  Filled 2017-09-21: qty 1

## 2017-09-21 MED ORDER — PANTOPRAZOLE SODIUM 40 MG PO TBEC
40.0000 mg | DELAYED_RELEASE_TABLET | Freq: Every day | ORAL | Status: DC
  Administered 2017-09-22 – 2017-09-23 (×2): 40 mg via ORAL
  Filled 2017-09-21 (×3): qty 1

## 2017-09-21 MED ORDER — OXYCODONE HCL 5 MG PO TABS
10.0000 mg | ORAL_TABLET | ORAL | Status: DC | PRN
  Administered 2017-09-22 – 2017-09-23 (×3): 15 mg via ORAL
  Filled 2017-09-21 (×3): qty 3
  Filled 2017-09-21: qty 2

## 2017-09-21 MED ORDER — MENTHOL 3 MG MT LOZG: 1.0000 | LOZENGE | OROMUCOSAL | Status: DC | PRN

## 2017-09-21 MED ORDER — METHOCARBAMOL 1000 MG/10ML IJ SOLN
500.0000 mg | Freq: Four times a day (QID) | INTRAVENOUS | Status: DC | PRN
  Filled 2017-09-21: qty 5

## 2017-09-21 MED ORDER — CEFAZOLIN SODIUM-DEXTROSE 2-4 GM/100ML-% IV SOLN
INTRAVENOUS | Status: AC
  Filled 2017-09-21: qty 100

## 2017-09-21 MED ORDER — ENOXAPARIN SODIUM 30 MG/0.3ML ~~LOC~~ SOLN
30.0000 mg | SUBCUTANEOUS | Status: DC
  Administered 2017-09-22 – 2017-09-23 (×2): 30 mg via SUBCUTANEOUS
  Filled 2017-09-21 (×2): qty 0.3

## 2017-09-21 MED ORDER — MIDAZOLAM HCL 2 MG/2ML IJ SOLN
INTRAMUSCULAR | Status: AC
  Filled 2017-09-21: qty 2

## 2017-09-21 MED ORDER — ALBUTEROL SULFATE (2.5 MG/3ML) 0.083% IN NEBU: 2.5000 mg | INHALATION_SOLUTION | Freq: Four times a day (QID) | RESPIRATORY_TRACT | Status: DC | PRN

## 2017-09-21 SURGICAL SUPPLY — 61 items
ALCOHOL 70% 16 OZ (MISCELLANEOUS) ×2 IMPLANT
BIT DRILL 5/64X5 DISP (BIT) IMPLANT
BLADE SAG 18X100X1.27 (BLADE) ×2 IMPLANT
CEMENT BONE REFOBACIN R1X40 US (Cement) ×2 IMPLANT
COVER SURGICAL LIGHT HANDLE (MISCELLANEOUS) ×2 IMPLANT
DRAPE INCISE IOBAN 66X45 STRL (DRAPES) ×2 IMPLANT
DRAPE ORTHO SPLIT 77X108 STRL (DRAPES) ×2
DRAPE SURG ORHT 6 SPLT 77X108 (DRAPES) ×2 IMPLANT
DRAPE U-SHAPE 47X51 STRL (DRAPES) ×4 IMPLANT
DRSG ADAPTIC 3X8 NADH LF (GAUZE/BANDAGES/DRESSINGS) ×2 IMPLANT
DRSG AQUACEL AG ADV 3.5X10 (GAUZE/BANDAGES/DRESSINGS) ×2 IMPLANT
DRSG PAD ABDOMINAL 8X10 ST (GAUZE/BANDAGES/DRESSINGS) ×2 IMPLANT
DURAPREP 26ML APPLICATOR (WOUND CARE) ×2 IMPLANT
ELECT BLADE 4.0 EZ CLEAN MEGAD (MISCELLANEOUS) ×2
ELECT REM PT RETURN 9FT ADLT (ELECTROSURGICAL) ×2
ELECTRODE BLDE 4.0 EZ CLN MEGD (MISCELLANEOUS) ×1 IMPLANT
ELECTRODE REM PT RTRN 9FT ADLT (ELECTROSURGICAL) ×1 IMPLANT
GAUZE SPONGE 4X4 12PLY STRL (GAUZE/BANDAGES/DRESSINGS) ×2 IMPLANT
GLENOID WITH CLEAT MEDIUM (Shoulder) ×2 IMPLANT
GLOVE BIO SURGEON STRL SZ7.5 (GLOVE) ×6 IMPLANT
GLOVE BIOGEL PI IND STRL 7.0 (GLOVE) ×1 IMPLANT
GLOVE BIOGEL PI IND STRL 8 (GLOVE) ×1 IMPLANT
GLOVE BIOGEL PI INDICATOR 7.0 (GLOVE) ×1
GLOVE BIOGEL PI INDICATOR 8 (GLOVE) ×1
GLOVE SURG SS PI 7.0 STRL IVOR (GLOVE) ×2 IMPLANT
GLOVE SURG SS PI 8.0 STRL IVOR (GLOVE) ×6 IMPLANT
GOWN STRL REUS W/ TWL LRG LVL3 (GOWN DISPOSABLE) IMPLANT
GOWN STRL REUS W/ TWL XL LVL3 (GOWN DISPOSABLE) ×2 IMPLANT
GOWN STRL REUS W/TWL LRG LVL3 (GOWN DISPOSABLE)
GOWN STRL REUS W/TWL XL LVL3 (GOWN DISPOSABLE) ×2
HEAD HUMERAL USP II 44/17 (Head) ×2 IMPLANT
KIT BASIN OR (CUSTOM PROCEDURE TRAY) ×2 IMPLANT
KIT SET UNIVERSAL (KITS) ×2 IMPLANT
KIT SUTURE APEX UNIVERSE (KITS) ×2 IMPLANT
KIT TURNOVER KIT B (KITS) ×2 IMPLANT
MANIFOLD NEPTUNE II (INSTRUMENTS) ×2 IMPLANT
NS IRRIG 1000ML POUR BTL (IV SOLUTION) ×2 IMPLANT
PACK SHOULDER (CUSTOM PROCEDURE TRAY) ×2 IMPLANT
PAD ARMBOARD 7.5X6 YLW CONV (MISCELLANEOUS) ×4 IMPLANT
SLING ARM FOAM STRAP LRG (SOFTGOODS) IMPLANT
SLING ARM FOAM STRAP MED (SOFTGOODS) IMPLANT
SLING ARM IMMOBILIZER LRG (SOFTGOODS) ×2 IMPLANT
SMARTMIX MINI TOWER (MISCELLANEOUS) ×2
SPONGE LAP 18X18 X RAY DECT (DISPOSABLE) ×2 IMPLANT
SPONGE LAP 4X18 RFD (DISPOSABLE) IMPLANT
STEM HUMERAL APEX UNIVERS 8MM (Stem) ×2 IMPLANT
STRIP CLOSURE SKIN 1/2X4 (GAUZE/BANDAGES/DRESSINGS) ×2 IMPLANT
SUCTION FRAZIER HANDLE 10FR (MISCELLANEOUS) ×1
SUCTION TUBE FRAZIER 10FR DISP (MISCELLANEOUS) ×1 IMPLANT
SUT FIBERWIRE #2 38 T-5 BLUE (SUTURE) ×6
SUT MNCRL AB 3-0 PS2 18 (SUTURE) ×2 IMPLANT
SUT MNCRL AB 4-0 PS2 18 (SUTURE) ×2 IMPLANT
SUT VIC AB 0 CT1 27 (SUTURE) ×1
SUT VIC AB 0 CT1 27XBRD ANBCTR (SUTURE) ×1 IMPLANT
SUT VIC AB 2-0 CT1 27 (SUTURE) ×2
SUT VIC AB 2-0 CT1 TAPERPNT 27 (SUTURE) ×2 IMPLANT
SUTURE FIBERWR #2 38 T-5 BLUE (SUTURE) ×3 IMPLANT
TOWEL OR 17X26 10 PK STRL BLUE (TOWEL DISPOSABLE) ×2 IMPLANT
TOWER SMARTMIX MINI (MISCELLANEOUS) ×1 IMPLANT
WATER STERILE IRR 1000ML POUR (IV SOLUTION) ×2 IMPLANT
YANKAUER SUCT BULB TIP NO VENT (SUCTIONS) ×2 IMPLANT

## 2017-09-21 NOTE — Brief Op Note (Signed)
09/21/2017  3:24 PM  PATIENT:  Wylene Simmer  67 y.o. female  PRE-OPERATIVE DIAGNOSIS:  Left shoulder osteoarthritis  POST-OPERATIVE DIAGNOSIS:  Left shoulder osteoarthritis  PROCEDURE:  Procedure(s): LEFT TOTAL SHOULDER ARTHROPLASTY (Left)  SURGEON:  Surgeon(s) and Role:    * Nicholes Stairs, MD - Primary  PHYSICIAN ASSISTANT:   ASSISTANTS: Ky Barban, RNFA   ANESTHESIA:   regional and general  EBL:  100 cc  BLOOD ADMINISTERED:none  DRAINS: none   LOCAL MEDICATIONS USED:  NONE  SPECIMEN:  No Specimen  DISPOSITION OF SPECIMEN:  N/A  COUNTS:  YES  TOURNIQUET:  * No tourniquets in log *  DICTATION: .Note written in EPIC  PLAN OF CARE: Admit to inpatient   PATIENT DISPOSITION:  PACU - hemodynamically stable.   Delay start of Pharmacological VTE agent (>24hrs) due to surgical blood loss or risk of bleeding: not applicable

## 2017-09-21 NOTE — Anesthesia Procedure Notes (Signed)
Anesthesia Regional Block: Interscalene brachial plexus block   Pre-Anesthetic Checklist: ,, timeout performed, Correct Patient, Correct Site, Correct Laterality, Correct Procedure, Correct Position, site marked, Risks and benefits discussed,  Surgical consent,  Pre-op evaluation,  At surgeon's request and post-op pain management  Laterality: Left  Prep: chloraprep       Needles:  Injection technique: Single-shot  Needle Type: Stimiplex     Needle Length: 9cm  Needle Gauge: 21     Additional Needles:   Procedures:,,,, ultrasound used (permanent image in chart),,,,  Narrative:  Start time: 09/21/2017 12:27 PM End time: 09/21/2017 12:32 PM Injection made incrementally with aspirations every 5 mL.  Performed by: Personally  Anesthesiologist: Lynda Rainwater, MD

## 2017-09-21 NOTE — Progress Notes (Signed)
   09/21/17 1142  Clinical Encounter Type  Visited With Patient and family together;Health care provider  Visit Type Initial;Pre-op  Referral From Patient;Nurse  Consult/Referral To Chaplain  Spiritual Encounters  Spiritual Needs Literature   Received a page to assist this patient with HCPA.  Patient's daughter was at bedside.  The document was completed except for the signature.  Obtained 2 witnesses and completed the document.  Copy and Original given to the family and copy for the chart.  Will follow and support as needed. Chaplain Katherene Ponto

## 2017-09-21 NOTE — Op Note (Signed)
Date of Surgery: 09/21/2017  INDICATIONS: [name] is a 67 y.o.-year-old female with a left advanced glenohumeral osteoarthritis;  The patient did consent to the procedure after discussion of the risks and benefits.  PREOPERATIVE DIAGNOSIS: Left shoulder end-stage osteoarthritis, glenohumeral joint.  POSTOPERATIVE DIAGNOSIS: Same.  PROCEDURE: 1.  Left anatomic shoulder arthroplasty.  SURGEON: Geralynn Rile, M.D.  ASSIST: Ky Barban, RNFA.  ANESTHESIA:  general, and regional anesthetic with interscalene block  IV FLUIDS AND URINE: See anesthesia.  ESTIMATED BLOOD LOSS: 100 mL.  IMPLANTS: Arthrex universe total shoulder Apex humeral stem size 8 mm Arthrex universe humeral head size 44-17 Arthrex universe vault lock glenoid size medium Apex suture repair kit for subscapularis repair.  DRAINS: None  COMPLICATIONS: None.  DESCRIPTION OF PROCEDURE: After obtaining informed consent,The patient was brought to the operating room and placed in the beachchair position on the operating table.  The patient had been signed prior to the procedure and this was documented. The patient had the anesthesia placed by the anesthesiologist.  A time-out was performed to confirm that this was the correct patient, site, side and location. The patient did receive antibiotics prior to the incision and was re-dosed during the procedure as needed at indicated intervals.   The patient had the operative extremity prepped and draped in the standard surgical fashion.   The head and neck were nicely stabilized.   A 10 blade was used to make a standard deltopectoral approach to the shoulder.  Dissection was carried out through the subcutaneous tissue to where the deltopectoral interval was visualized and developed.  The cephalic vein was mobilized and retracted laterally for the duration of the case.  The subacromial space was cleared bluntly retractors were placed appropriately deep to the pectoralis major tendon  and deep to the deltoid muscle fascia.  The upper one third of the pectoralis muscle insertion on the humerus was sharply released.  The superior and posterior rotator cuff tendons were noted to be intact without tears.  Next I performed a subscapularis takedown and a peel fashion.  Subscapularis, rotator interval, and the inferior capsule were all released.  There were inferior humeral osteophytes that were removed with rongeur and curved osteotome.  After, Releases, a humeral head osteotomy was performed in 30 of retroversion and at 135 head neck angle.  A protractor was placed within the glenoid vault to protect axillary nerve and posterior capsular structures.  Next we prepared the humerus.  Humeral canal was first reamed up to a size 7 reamer.  Next maintaining 30 of retroversion we broached up to a size 8 mm Apex stem And this did have excellent press fit and rotational stability.  We then placed a humeral head protector and turned our attention to preparing the glenoid.  Retractors were placed anteriorly, superiorly, and posteriorly and the labrum was excised.  Exposure of the glenoid was obtained.  The center drill bit was used followed by sequential reaming.  Next using the pegged glenoid guide the holes were drilled appropriately.  The medium size glenoid proved to be the appropriate size for this glenoid.  Next the holes were drilled and chiseled appropriately.  Glenoid  was washed with antibiotic irrigation.  Next glenoid vault was dried.  We trialed once again and were satisfied with the medium sized the glenoid.  Next, using antibodies cementing the superior and inferior holes the glenoid was cemented into place.  Of note, the center ball-like peg was filled with humeral head autograft and not cemented  per the manufacturer's recommendation.  Excellent fixation of the glenoid was obtained.   We then turned our attention back to the humeral side.  The humeral stem was found to still be  rotationally stable and the final stem was opened at the above size.  2 drill holes were placed in the bicipital tuberosity for subscapularis repair.  The apex suture fixation technique was prepared on the back table.  The stem was malleted into place with excellent fixation.  The Arthrex guide was then used to secure the collar at the appropriate inclination on the humeral stem.  We then trialed humeral heads and found the 44 x 17 mm humeral head to be satisfactory.  This demonstrated posterior pushback of just at 50% with spontaneous reduction and likewise inferior pull down of 50% with spontaneous reduction.  That head was then removed and the final humeral head was malleted in place with the above size.   Lastly we turned our attention to the subscapularis repair.  The shoulder was copiously irrigated once again with antibiotic solution.  The apex subscapularis repair technique was then performed.  Using the apex technique all sutures were sequentially tied per the manufacturer recommendation.  The medial knots were secured, giving excellent repair to the subscapularis.  A #2 FiberWire was then used to close the rotator interval.  This was done in 45 of abduction and external rotation.  Patient's biceps tendon was abraded and fixed to the pectoralis major insertion with a #2 FiberWire in a figure-of-eight fashion.  The wound was once again copious bleed irrigated using antibiotic solution and then normal saline.  We evaluated once again for hemostasis.  The wound was closed in layers with a #2 FiberWire in the deltopectoral interval.  An 0 Vicryl was used to close the subcutaneous fat layer.  2-0 Vicryl for the deep dermal layer and 3-0 Monocryl in a subcuticular fashion followed by Dermabond glue.  A standard sterile occlusive dressing was applied as well as a postoperative sling.   The patient tolerated the procedure well.  All counts were correct 2.  There were no intraoperative complications.  The  patient was transferred to PACU in stable condition.   Disposition: The patient will be nonweightbearing to the operative extremity for proximally 4-6 weeks.  She will begin physical therapy in the outpatient setting.  She will be admitted for routine postoperative care including antibiotics, pain control, and DVT prophylaxis.  Sling will be maintained at all times.  Return for wound check in approximately 2 weeks and then again at 6 weeks.  POSTOPERATIVE PLAN:  Admit to the orthopedic inpatient service for routine postoperative care.  Sling and nonweightbearing to the left upper extremity.

## 2017-09-21 NOTE — Anesthesia Preprocedure Evaluation (Signed)
Anesthesia Evaluation  Patient identified by MRN, date of birth, ID band Patient awake    Reviewed: Allergy & Precautions, NPO status , Patient's Chart, lab work & pertinent test results  Airway Mallampati: II  TM Distance: >3 FB Neck ROM: Full    Dental no notable dental hx.    Pulmonary asthma ,    Pulmonary exam normal breath sounds clear to auscultation       Cardiovascular negative cardio ROS Normal cardiovascular exam Rhythm:Regular Rate:Normal     Neuro/Psych negative neurological ROS  negative psych ROS   GI/Hepatic Neg liver ROS, GERD  ,  Endo/Other  negative endocrine ROS  Renal/GU negative Renal ROS     Musculoskeletal  (+) Arthritis ,   Abdominal (+) + obese,   Peds  Hematology negative hematology ROS (+)   Anesthesia Other Findings   Reproductive/Obstetrics negative OB ROS                             Anesthesia Physical  Anesthesia Plan  ASA: II  Anesthesia Plan: General   Post-op Pain Management: GA combined w/ Regional for post-op pain   Induction: Intravenous  PONV Risk Score and Plan: 3 and Ondansetron, Dexamethasone and Midazolam  Airway Management Planned: LMA  Additional Equipment:   Intra-op Plan:   Post-operative Plan: Extubation in OR  Informed Consent: I have reviewed the patients History and Physical, chart, labs and discussed the procedure including the risks, benefits and alternatives for the proposed anesthesia with the patient or authorized representative who has indicated his/her understanding and acceptance.   Dental advisory given  Plan Discussed with: CRNA  Anesthesia Plan Comments:         Anesthesia Quick Evaluation

## 2017-09-21 NOTE — Anesthesia Procedure Notes (Signed)
Procedure Name: LMA Insertion Date/Time: 09/21/2017 1:19 PM Performed by: Scheryl Darter, CRNA Pre-anesthesia Checklist: Patient identified, Emergency Drugs available, Suction available and Patient being monitored Patient Re-evaluated:Patient Re-evaluated prior to induction Oxygen Delivery Method: Circle System Utilized Preoxygenation: Pre-oxygenation with 100% oxygen Induction Type: IV induction Ventilation: Mask ventilation without difficulty LMA: LMA inserted LMA Size: 4.0 Number of attempts: 1 Airway Equipment and Method: Bite block Placement Confirmation: positive ETCO2 Tube secured with: Tape Dental Injury: Teeth and Oropharynx as per pre-operative assessment

## 2017-09-21 NOTE — Transfer of Care (Signed)
Immediate Anesthesia Transfer of Care Note  Patient: [name]  Procedure(s) Performed: LEFT TOTAL SHOULDER ARTHROPLASTY (Left Shoulder)  Patient Location: PACU  Anesthesia Type:General  Level of Consciousness: awake, alert  and oriented  Airway & Oxygen Therapy: Patient connected to face mask oxygen  Post-op Assessment: Report given to RN and Post -op Vital signs reviewed and stable  Post vital signs: stable  Last Vitals:  Vitals Value Taken Time  BP    Temp    Pulse 95 09/21/2017  3:46 PM  Resp    SpO2 99 % 09/21/2017  3:46 PM  Vitals shown include unvalidated device data.  Last Pain:  Vitals:   09/21/17 1055  TempSrc:   PainSc: 2       Patients Stated Pain Goal: 0 (16/10/96 0454)  Complications: No apparent anesthesia complications

## 2017-09-21 NOTE — Discharge Instructions (Signed)
Orthopedic discharge instructions:  -Maintain sling to the left upper extremity at all times unless performing elbow, hand, and wrist range of motion exercises. -Maintain postoperative bandage until your follow-up appointment.  You may shower with this bandage in place.  Do not submerge underwater. -For mild to moderate pain use Tylenol and/or ibuprofen as needed.  Apply ice to the left shoulder for 30 minutes at a time out of each hour you are awake.  For severe pain use oxycodone as directed. -Return to see Dr. Stann Mainland in 2 weeks postoperatively for routine wound check.

## 2017-09-21 NOTE — H&P (Signed)
ORTHOPAEDIC h and P  REQUESTING PHYSICIAN: Nicholes Stairs, MD  PCP:  Hali Marry, MD  Chief Complaint: Left shoulder arthritis  HPI: [name] is a 67 y.o. female who complains of left shoulder arthritis that has failed to improve with conservative measures.  She presents today for left total shoulder replacement.  No new complaints at this time.  She is previously been seen in my office and worked up extensively for today's surgery.  No new questions.  Past Medical History:  Diagnosis Date  . ADD (attention deficit disorder)   . Arthritis    "bunion on  right" (08/11/2014)  . Chronic bronchitis ([place])    "plenty; but not q yr" (08/11/2014)  . Depression   . Dyslipidemia   . Environmental allergies   . GERD (gastroesophageal reflux disease)   . Hearing difficulty of left ear    "grew up w/deaf parent; find myself lip reading more recently" (08/11/2014)  . Hepatitis    "exposed in college; rec'd gamma globulin; 6 months later S/S (weakness, lethargy, fevers); ; dx'd infectious hepatitis"  . History of herpes genitalis    "stress induced reoccurance that shows up on my left middle finger" (08/11/2014)  . History of hiatal hernia   . History of stomach ulcers   . Hypercholesterolemia   . Hyperthyroidism 1994   "postpartum only; resolved itself"  . Incisional hernia   . Internal hemorrhoids   . Leg cramping   . Osteopenia   . Seasonal asthma   . Stress incontinence, female   . Ulcer    Past Surgical History:  Procedure Laterality Date  . CESAREAN SECTION  1994  . CHALAZION EXCISION Left ~ 2013  . DILATION AND CURETTAGE OF UTERUS  X 2  . EYE SURGERY    . HERNIA REPAIR  08/11/2014  . INCISIONAL HERNIA REPAIR N/A 08/11/2014   Procedure: LAPAROSCOPIC INCISIONAL HERNIA REPAIR;  Surgeon: Coralie Keens, MD;  Location: [place];  Service: General;  Laterality: N/A;  . INCONTINENCE SURGERY  2005  . INGUINAL HERNIA REPAIR Left 08/11/2014  . INGUINAL HERNIA  REPAIR Left 08/11/2014   Procedure: LEFT INGUINAL HERNIA REPAIR;  Surgeon: Coralie Keens, MD;  Location: [place];  Service: General;  Laterality: Left;  . INSERTION OF MESH Left 08/11/2014   Procedure: INSERTION OF MESH TO LEFT GROIN AND ABDOMEN;  Surgeon: Coralie Keens, MD;  Location: [place];  Service: General;  Laterality: Left;  . LAPAROSCOPIC INCISIONAL / UMBILICAL / VENTRAL HERNIA REPAIR  08/11/2014   [facility]  . NASAL POLYP EXCISION  2014  . REPAIR OF PERFORATED ULCER  1985  . TONSILLECTOMY     Social History   Socioeconomic History  . Marital status: Divorced    Spouse name: Not on file  . Number of children: Not on file  . Years of education: Not on file  . Highest education level: Not on file  Occupational History  . Occupation: Consulting civil engineer    Comment: Full time; Barista  Social Needs  . Financial resource strain: Not on file  . Food insecurity:    Worry: Not on file    Inability: Not on file  . Transportation needs:    Medical: Not on file    Non-medical: Not on file  Tobacco Use  . Smoking status: Never Smoker  . Smokeless tobacco: Never Used  Substance and Sexual Activity  . Alcohol use: Yes    Alcohol/week: 2.0 standard drinks    Types:  1 Glasses of wine, 1 Shots of liquor per week    Comment: 08/11/2014 "0-2 glasses of wine or a mixed drink q wk"  . Drug use: No  . Sexual activity: Never    Birth control/protection: Post-menopausal  Lifestyle  . Physical activity:    Days per week: Not on file    Minutes per session: Not on file  . Stress: Not on file  Relationships  . Social connections:    Talks on phone: Not on file    Gets together: Not on file    Attends religious service: Not on file    Active member of club or organization: Not on file    Attends meetings of clubs or organizations: Not on file    Relationship status: Not on file  Other Topics Concern  . Not on file  Social History Narrative   Marital status: divorced;  not dating      Children: 1 daughter; no grandchildren      Lives:        Employment: Optometrist      Tobacco:       Alcohol:      Drugs:       Exercise:    Family History  Problem Relation Age of Onset  . Arthritis Mother   . Heart disease Mother        CABG @ 56  . Diabetes Mother   . Osteoporosis Mother   . Hyperlipidemia Mother   . Arthritis Father   . Lymphoma Father 50       chemo  . Cancer Father        lymphoma  . Lymphoma Paternal Grandmother   . Diabetes Maternal Grandmother    Allergies  Allergen Reactions  . Aspirin Other (See Comments)    GI Intolerance History of ulcers GI BLEED  . Bee Venom     Any insects that bite or stings Other reaction(s): Other  . Nsaids Other (See Comments)    History of ulcers   Prior to Admission medications   Medication Sig Start Date End Date Taking? Authorizing Provider  acetaminophen (TYLENOL) 650 MG CR tablet Take 1,300 mg by mouth every 8 (eight) hours as needed for pain.   Yes [provider]  albuterol (PROAIR HFA) 108 (90 Base) MCG/ACT inhaler INHALE 2 PUFFS INTO THE LUNGS EVERY 6 (SIX) HOURS AS NEEDED *EMERGENCY FILL* 05/28/17  Yes [name], MD  amphetamine-dextroamphetamine (ADDERALL) 20 MG tablet Take 1 tablet (20 mg total) by mouth 2 (two) times daily. 05/28/17  Yes [name], MD  CALCIUM-MAGNESIUM PO Take 3 tablets by mouth daily.   Yes [provider]  DULoxetine (CYMBALTA) 30 MG capsule Take 1 capsule (30 mg total) by mouth daily. 05/28/17  Yes [name], MD  DULoxetine (CYMBALTA) 60 MG capsule Take 1 capsule (60 mg total) by mouth daily. 05/28/17  Yes [name], MD  esomeprazole (NEXIUM) 40 MG capsule Take 1 capsule (40 mg total) by mouth daily at 12 noon. 05/28/17  Yes [name], MD  glucosamine-chondroitin 500-400 MG tablet Take 1 tablet by mouth 3 (three) times daily.   Yes [provider]  HYDROcodone-acetaminophen (NORCO) 10-325 MG tablet Take 1  tablet by mouth every 4 (four) hours as needed. 07/30/17  Yes Evelina Bucy, DPM  HYDROcodone-acetaminophen (NORCO) 10-325 MG tablet Take 1 tablet by mouth every 8 (eight) hours as needed. 08/07/17  Yes RegalTamala Fothergill, DPM  ipratropium (ATROVENT)  0.03 % nasal spray Place 2 sprays into the nose 4 (four) times daily. Use as needed for runny nose and congestion 07/13/17  Yes [name], MD  loratadine (CLARITIN) 10 MG tablet Take 1 tablet (10 mg total) by mouth daily. 11/29/15  Yes [name], MD  mometasone (NASONEX) 50 MCG/ACT nasal spray INHALE 2 SPRAYS INTO THE NOSE DAILY AS NEEDED 11/10/16  Yes [name], MD  simvastatin (ZOCOR) 40 MG tablet Take 1 tablet (40 mg total) by mouth daily. 05/28/17  Yes [name], MD   No results found.  Positive ROS: All other systems have been reviewed and were otherwise negative with the exception of those mentioned in the HPI and as above.  Physical Exam: General: Alert, no acute distress Cardiovascular: No pedal edema Respiratory: No cyanosis, no use of accessory musculature GI: No organomegaly, abdomen is soft and non-tender Skin: No lesions in the area of chief complaint Neurologic: Sensation intact distally Psychiatric: Patient is competent for consent with normal mood and affect Lymphatic: No axillary or cervical lymphadenopathy   Assessment: Left end-stage glenohumeral arthritis  Plan: -Plan for left total shoulder arthroplasty today.  We will admit her postoperatively for routine postoperative care. -We again reviewed the risk and benefits of this procedure including but not limited to bleeding, infection, damage to surrounding neurovascular structures, need for further surgery, failure of implants, dislocation, rotator cuff tear, development of blood clots, and the risk of anesthesia.  She has provided informed consent. -All questions were solicited and answered to her satisfaction.      Nicholes Stairs, MD Cell  [phone]    09/21/2017 11:55 AM

## 2017-09-21 NOTE — Anesthesia Postprocedure Evaluation (Signed)
Anesthesia Post Note  Patient: [name]  Procedure(s) Performed: LEFT TOTAL SHOULDER ARTHROPLASTY (Left Shoulder)     Patient location during evaluation: PACU Anesthesia Type: General Level of consciousness: awake and alert Pain management: pain level controlled Vital Signs Assessment: post-procedure vital signs reviewed and stable Respiratory status: spontaneous breathing, nonlabored ventilation and respiratory function stable Cardiovascular status: blood pressure returned to baseline and stable Postop Assessment: no apparent nausea or vomiting Anesthetic complications: no    Last Vitals:  Vitals:   09/21/17 1647 09/21/17 2023  BP: 114/73 107/68  Pulse: 91 98  Resp: 18   Temp: 36.8 C 36.7 C  SpO2: 96% 95%    Last Pain:  Vitals:   09/21/17 2023  TempSrc: Oral  PainSc:    Pain Goal: Patients Stated Pain Goal: 0 (09/21/17 1055)               Audry Pili

## 2017-09-22 ENCOUNTER — Other Ambulatory Visit: Payer: Self-pay

## 2017-09-22 LAB — BASIC METABOLIC PANEL
Anion gap: 7 (ref 5–15)
BUN: 21 mg/dL (ref 8–23)
CO2: 27 mmol/L (ref 22–32)
CREATININE: 0.85 mg/dL (ref 0.44–1.00)
Calcium: 8.8 mg/dL — ABNORMAL LOW (ref 8.9–10.3)
Chloride: 105 mmol/L (ref 98–111)
Glucose, Bld: 118 mg/dL — ABNORMAL HIGH (ref 70–99)
Potassium: 4.9 mmol/L (ref 3.5–5.1)
SODIUM: 139 mmol/L (ref 135–145)

## 2017-09-22 LAB — CBC
HCT: 33.3 % — ABNORMAL LOW (ref 36.0–46.0)
HEMOGLOBIN: 10.4 g/dL — AB (ref 12.0–15.0)
MCH: 28.3 pg (ref 26.0–34.0)
MCHC: 31.2 g/dL (ref 30.0–36.0)
MCV: 90.5 fL (ref 78.0–100.0)
Platelets: 326 10*3/uL (ref 150–400)
RBC: 3.68 MIL/uL — ABNORMAL LOW (ref 3.87–5.11)
RDW: 14.6 % (ref 11.5–15.5)
WBC: 11.3 10*3/uL — ABNORMAL HIGH (ref 4.0–10.5)

## 2017-09-22 MED ORDER — SIMVASTATIN 40 MG PO TABS: 40.0000 mg | ORAL_TABLET | Freq: Every day | ORAL | Status: DC

## 2017-09-22 MED ORDER — SIMVASTATIN 40 MG PO TABS
40.0000 mg | ORAL_TABLET | Freq: Every day | ORAL | Status: DC
  Administered 2017-09-22: 40 mg via ORAL
  Filled 2017-09-22: qty 1

## 2017-09-22 NOTE — Evaluation (Signed)
Physical Therapy Evaluation Patient Details Name: [name] MRN: [id number] DOB: October 24, 1950 Today's Date: 09/22/2017   History of Present Illness  [name] is a 67 y.o. female who complains of left shoulder arthritis that has failed to improve with conservative measures.  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  Pt with History of ADD and HOH left ear and several toe surgeries on right foot - most recent in July 2019  Clinical Impression  Pt at baseline mobitly with walking.  Daughter to provide assist initially as needed.  Focus today on safety with mobilty and fall prevention.  Encouraged pt to continue with basic walking and 3-5 sit to stands for LE strengthening at this time.    Follow Up Recommendations No PT follow up    Equipment Recommendations  None recommended by PT(talked to pt and daughter about the idea of pt using a cane in right hand to help with balance.  pt and daughter VU)    Recommendations for Other Services       Precautions / Restrictions Precautions Precautions: Fall Precaution Comments: pt has history of falls - she has fallen at least 3 times in last 3 months - esp on uneven terrain. Required Braces or Orthoses: Other Brace/Splint Other Brace/Splint: left shoulder - to wear at all times Restrictions Weight Bearing Restrictions: Yes LUE Weight Bearing: Non weight bearing      Mobility  Bed Mobility Overal bed mobility: Needs Assistance             General bed mobility comments: see OT eval  Transfers Overall transfer level: Needs assistance Equipment used: None Transfers: Sit to/from Stand Sit to Stand: Min guard         General transfer comment: Pt impulsive with her movements.  she needs someone with her when OOB - daughter agrees  Ambulation/Gait Ambulation/Gait assistance: Min guard Social research officer, government (Feet): 50 Feet Assistive device: 1 person hand held assist Gait Pattern/deviations: Step-through pattern;Decreased  dorsiflexion - right     General Gait Details: pt with functional gait - she reports catching her toes (and wearing her shoes down) on right front of shoe.  she did well with me but has to focus on task.  her toes are not as coordinated on right foot as left - due to several toe surgeries.  discussed safety wth this.  Daugher knows to guard pt from right side  Financial trader [employer] (Stroke Patients Only)       Balance Overall balance assessment: History of Falls;Mild deficits observed, not formally tested                                           Pertinent Vitals/Pain Pain Assessment: 0-10 Pain Score: 3  Pain Location: left shoulder Pain Intervention(s): Repositioned;Monitored during session;Premedicated before session    [place] expects to be discharged to:: Private residence Living Arrangements: Children Available Help at Discharge: Family Type of Home: House         Home Equipment: None      Prior Function Level of Independence: Independent         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  she has functional AROM and strength)    Cervical / Trunk Assessment Cervical / Trunk Assessment: Normal  Communication   Communication: No difficulties(Pt talks a lot - she has ADD and hard time focusing on topic)  Cognition Arousal/Alertness: Awake/alert Behavior During Therapy: Impulsive Overall Cognitive Status: History of cognitive impairments - at baseline                                 General Comments: Pt has ADD - she had hard time stopping to talk to listen.  discussed safety issues with this  combined with increased pain meds.  Daughter present for discussion and to stay with pt when she gets up      General Comments General comments (skin integrity, edema, etc.): Daughter will be available to be with pt for the next several days.  pt lives with daughter (who works 3 days a week )    Exercises     Assessment/Plan    PT Assessment Patent does not need any further PT services  PT Problem List         PT Treatment Interventions      PT Goals (Current goals can be found in the Care Plan section)  Acute Rehab PT Goals Patient Stated Goal: to get home and heal - wtih daughters assist PT Goal Formulation: With patient/family Time For Goal Achievement: 09/22/17 Potential to Achieve Goals: Good    Frequency     Barriers to discharge        Co-evaluation               AM-PAC PT "6 Clicks" Daily Activity  Outcome Measure Difficulty turning over in bed (including adjusting bedclothes, sheets and blankets)?: Unable Difficulty moving from lying on back to sitting on the side of the bed? : Unable Difficulty sitting down on and standing up from a chair with arms (e.g., wheelchair, bedside commode, etc,.)?: A Little Help needed moving to and from a bed to chair (including a wheelchair)?: A Little Help needed walking in hospital room?: A Little Help needed climbing 3-5 steps with a railing? : A Little 6 Click Score: 14    End of Session Equipment Utilized During Treatment: Gait belt Activity Tolerance: Patient tolerated treatment well;No increased pain Patient left: in chair;with family/visitor present;with call bell/phone within reach Nurse Communication: Mobility status PT Visit Diagnosis: Unsteadiness on feet (R26.81);Repeated falls (R29.6);History of falling (Z91.81)    Time: 9470-      Charges:   PT Evaluation $PT Eval Low Complexity: 1 Low         09/22/2017   Rande Lawman, PT   Loyal Buba 09/22/2017, 9:33 AM

## 2017-09-22 NOTE — Evaluation (Signed)
Occupational Therapy Evaluation Patient Details Name: [name] MRN: [id number] DOB: [birth year] Today's Date: 09/22/2017    History of Present Illness [name] is a 67 y.o. female who complains of left shoulder arthritis that has failed to improve with conservative measures.  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  Pt with History of ADD and HOH left ear and several toe surgeries on right foot - most recent in July 2019   Clinical Impression   PTA, pt was independent with ADL and functional mobiltiy although she reports multiple recent falls. 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 Pt currently requiring mod assist for UB ADL, min assist for LB ADL, and min assist for standing grooming tasks. She was educated concerning shoulder precautions, conservative protocol, and sling wear schedule. Initiated education concerning AROM elbow/wrist/hand as well as compensatory strategies to complete ADL while adhering to shoulder precautions. She demonstrated difficulty attending to instructions and pt reports that she did not take medication for ADD today. Pt would benefit from continued OT services while admitted to improve independence and safety with ADL and functional mobility. OT will continue to follow while admitted.     Follow Up Recommendations  Follow surgeon's recommendation for DC plan and follow-up therapies;Supervision/Assistance - 24 hour    Equipment Recommendations  None recommended by OT    Recommendations for Other Services       Precautions / Restrictions Precautions Precautions: Fall;Shoulder Type of Shoulder Precautions: Conservative protocol: sling at all times except ADL/exercise, AROM L elbow/wrist/hand, no AROM/PROM L shoulder.  Shoulder Interventions: Shoulder sling/immobilizer;Off for dressing/bathing/exercises Precaution Booklet Issued: Yes (comment) Precaution Comments: pt has history of falls - she has fallen at least 3 times in last 3 months -  esp on uneven terrain. Required Braces or Orthoses: Other Brace/Splint Other Brace/Splint: L shoulder sling at all times Restrictions Weight Bearing Restrictions: Yes LUE Weight Bearing: Non weight bearing      Mobility Bed Mobility Overal bed mobility: Needs Assistance Bed Mobility: Supine to Sit     Supine to sit: Supervision     General bed mobility comments: Educated concerning recommendation to exit bed to the R.   Transfers Overall transfer level: Needs assistance Equipment used: None Transfers: Sit to/from Stand Sit to Stand: Min guard         General transfer comment: Pt impulsive with standing.     Balance Overall balance assessment: History of Falls;Mild deficits observed, not formally tested                                         ADL either performed or assessed with clinical judgement   ADL Overall ADL's : Needs assistance/impaired Eating/Feeding: Set up;Sitting   Grooming: Minimal assistance;Sitting   Upper Body Bathing: Sitting;Moderate assistance   Lower Body Bathing: Minimal assistance;Sit to/from stand   Upper Body Dressing : Moderate assistance;Sitting   Lower Body Dressing: Minimal assistance;Sit to/from stand   Toilet Transfer: Ambulation;Min guard   Toileting- Water quality scientist and Hygiene: Sit to/from stand;Minimal assistance       Functional mobility during ADLs: Supervision/safety General ADL Comments: Pt and daughter educated concerning all compensatory ADL strategies per protocol.      Vision Baseline Vision/History: Wears glasses Wears Glasses: Distance only Patient Visual Report: No change from baseline Vision Assessment?: No apparent visual deficits     Perception     Praxis  Pertinent Vitals/Pain Pain Assessment: Faces Pain Score: 3  Faces Pain Scale: Hurts little more Pain Location: left shoulder Pain Descriptors / Indicators: Aching;Discomfort;Operative site guarding Pain  Intervention(s): Limited activity within patient's tolerance;Monitored during session;Repositioned     Hand Dominance Right   Extremity/Trunk Assessment Upper Extremity Assessment Upper Extremity Assessment: LUE deficits/detail LUE Deficits / Details: Post-operative deficits as expected. Some numbness on lateral aspect of upper arm.    Lower Extremity Assessment Lower Extremity Assessment: Generalized weakness   Cervical / Trunk Assessment Cervical / Trunk Assessment: Normal   Communication Communication Communication: (Difficulty focusing on topics. )   Cognition Arousal/Alertness: Awake/alert Behavior During Therapy: Impulsive Overall Cognitive Status: History of cognitive impairments - at baseline                                 General Comments: Pt with noted difficulty paying attention to topics. She has a history of ADD and decreased short term memory was noted.    General Comments  Daughter working 3 days/week during the day    Exercises Exercises: Shoulder;Other exercises Shoulder Exercises Elbow Flexion: AROM;Left;10 reps;Seated Elbow Extension: AROM;Left;10 reps;Seated Wrist Flexion: AROM;Left;10 reps;Seated Wrist Extension: AROM;Left;10 reps;Seated Digit Composite Flexion: AROM;Left;10 reps;Seated Composite Extension: AROM;Left;10 reps;Seated Other Exercises Other Exercises: AROM L forearm supination/pronation   Shoulder Instructions Shoulder Instructions Donning/doffing shirt without moving shoulder: Moderate assistance Method for sponge bathing under operated UE: Minimal assistance Donning/doffing sling/immobilizer: Moderate assistance Correct positioning of sling/immobilizer: Moderate assistance ROM for elbow, wrist and digits of operated UE: Supervision/safety Sling wearing schedule (on at all times/off for ADL's): Supervision/safety Proper positioning of operated UE when showering: Supervision/safety    Home Living Family/patient expects  to be discharged to:: Private residence Living Arrangements: Children Available Help at Discharge: Family Type of Home: Apartment Home Access: Elevator(curb to step up)     Home Layout: One level     Bathroom Shower/Tub: Tub/shower unit(with step in cut out)   Biochemist, clinical: Standard     Home Equipment: Toilet riser          Prior Functioning/Environment Level of Independence: Independent        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        OT Problem List: Decreased strength;Decreased range of motion;Decreased activity tolerance;Impaired balance (sitting and/or standing);Decreased safety awareness;Decreased knowledge of use of DME or AE;Decreased knowledge of precautions;Pain;Impaired UE functional use      OT Treatment/Interventions: Self-care/ADL training;Therapeutic exercise;Energy conservation;DME and/or AE instruction;Therapeutic activities;Patient/family education;Balance training    OT Goals(Current goals can be found in the care plan section) Acute Rehab OT Goals Patient Stated Goal: to get home and heal - wtih daughters assist OT Goal Formulation: With patient Time For Goal Achievement: 10/06/17 Potential to Achieve Goals: Good ADL Goals Pt Will Perform Grooming: with modified independence;standing Pt Will Perform Upper Body Dressing: with min guard assist;sitting Pt Will Perform Lower Body Dressing: with modified independence;sit to/from stand Pt Will Transfer to Toilet: with modified independence;ambulating;regular height toilet Pt Will Perform Toileting - Clothing Manipulation and hygiene: with modified independence;sit to/from stand Pt/caregiver will Perform Home Exercise Program: Right Upper extremity;With written HEP provided(elbow/wrist/hand AROM)  OT Frequency: Min 2X/week   Barriers to D/C:            Co-evaluation              AM-PAC PT "6 Clicks" Daily Activity     Outcome Measure  Help from another person eating  meals?: A Little Help from another person taking care of personal grooming?: A Little Help from another person toileting, which includes using toliet, bedpan, or urinal?: A Little Help from another person bathing (including washing, rinsing, drying)?: A Lot Help from another person to put on and taking off regular upper body clothing?: A Lot Help from another person to put on and taking off regular lower body clothing?: A Little 6 Click Score: 16   End of Session Equipment Utilized During Treatment: Gait belt;Rolling walker Nurse Communication: Mobility status  Activity Tolerance: Patient tolerated treatment well Patient left: in chair;with call bell/phone within reach;with family/visitor present  OT Visit Diagnosis: Other abnormalities of gait and mobility (R26.89);Pain Pain - Right/Left: Left Pain - part of body: Shoulder                Time: 5093-2671 OT Time Calculation (min): 42 min Charges:  OT General Charges $OT Visit: 1 Visit OT Evaluation $OT Eval Moderate Complexity: 1 Mod OT Treatments $Self Care/Home Management : 23-37 mins  Norman Herrlich, MS OTR/L  Pager: [place] A Teairra Millar 09/22/2017, 11:35 AM

## 2017-09-22 NOTE — Progress Notes (Signed)
   Subjective:  Patient reports pain as mild.  She states her nerve block were off this morning around 6 or 7 AM.  Pain is very well managed at this time per she denies nain, or shortness of breath.  Objective:   VITALS:   Vitals:   09/21/17 2023 09/22/17 0029 09/22/17 0049 09/22/17 0613  BP: 107/68 100/62  114/69  Pulse: 98 81  90  Resp:      Temp: 98 F (36.7 C) 98.4 F (36.9 C)  98.6 F (37 C)  TempSrc: Oral Oral  Oral  SpO2: 95% 93%  100%  Weight:   86.2 kg   Height:   5\' 1"  (1.549 m)     Neurologically intact Neurovascular intact Sensation intact distally Intact pulses distally Incision: dressing C/D/I sling in place Painless motion at the elbow with just mild ecchymosis along the brachium.  Lab Results  Component Value Date   WBC 11.3 (H) 09/22/2017   HGB 10.4 (L) 09/22/2017   HCT 33.3 (L) 09/22/2017   MCV 90.5 09/22/2017   PLT 326 09/22/2017   BMET    Component Value Date/Time   NA 139 09/22/2017 0322   NA 139 07/02/2017 1749   K 4.9 09/22/2017 0322   CL 105 09/22/2017 0322   CO2 27 09/22/2017 0322   GLUCOSE 118 (H) 09/22/2017 0322   BUN 21 09/22/2017 0322   BUN 12 07/02/2017 1749   CREATININE 0.85 09/22/2017 0322   CREATININE 0.79 11/29/2015 1133   CALCIUM 8.8 (L) 09/22/2017 0322   GFRNONAA >60 09/22/2017 0322   GFRAA >60 09/22/2017 0322     Assessment/Plan: 1 Day Post-Op   Principal Problem:   Osteoarthritis, localized, shoulder, left Active Problems:   History of left shoulder replacement   Up with therapy - Maintain sling at all time with nonweightbearing to the left arm.  - Okay for elbow, wrist, and hand range of motion both active and passive S tolerated.  - Maintain postoperative bandage until follow-up appointment.  - SCDs while in bed, As well as Lovenox while inpatient for DVT prophylaxis.  - PRN for pain control.  - Anticipate discharge home tomorrow.  We will continue inpatient for pain control.   Nicholes Stairs 09/22/2017, 1:16 PM   Geralynn Rile, MD [phone]

## 2017-09-23 NOTE — Progress Notes (Signed)
   Subjective:  Patient reports pain as mild.  She had a good day yesterday and a good night overnight.  No specific complaints this morning.  She denies shortness of breath or chest pain.  No nausea or vomiting.  Objective:   VITALS:   Vitals:   09/22/17 2211 09/22/17 2300 09/23/17 0506 09/23/17 0856  BP:   129/81 127/74  Pulse:   90 87  Resp:  16 17 17   Temp: 99 F (37.2 C)  99.2 F (37.3 C) 99.2 F (37.3 C)  TempSrc: Oral  Oral Oral  SpO2:   97% 98%  Weight:      Height:        Neurologically intact Neurovascular intact Sensation intact distally Intact pulses distally Incision: dressing C/D/I sling in place Painless motion at the elbow with just mild ecchymosis along the brachium.  Lab Results  Component Value Date   WBC 11.3 (H) 09/22/2017   HGB 10.4 (L) 09/22/2017   HCT 33.3 (L) 09/22/2017   MCV 90.5 09/22/2017   PLT 326 09/22/2017   BMET    Component Value Date/Time   NA 139 09/22/2017 0322   NA 139 07/02/2017 1749   K 4.9 09/22/2017 0322   CL 105 09/22/2017 0322   CO2 27 09/22/2017 0322   GLUCOSE 118 (H) 09/22/2017 0322   BUN 21 09/22/2017 0322   BUN 12 07/02/2017 1749   CREATININE 0.85 09/22/2017 0322   CREATININE 0.79 11/29/2015 1133   CALCIUM 8.8 (L) 09/22/2017 0322   GFRNONAA >60 09/22/2017 0322   GFRAA >60 09/22/2017 0322     Assessment/Plan: 2 Days Post-Op   Principal Problem:   Osteoarthritis, localized, shoulder, left Active Problems:   History of left shoulder replacement   Up with therapy - Maintain sling at all time with nonweightbearing to the left arm.  - Okay for elbow, wrist, and hand range of motion both active and passive S tolerated.  - Maintain postoperative bandage until follow-up appointment.  - SCDs while in bed, As well as Lovenox while inpatient for DVT prophylaxis.  - PRN for pain control.  -Disposition: Discharge home today.  Follow-up with me in 2 weeks.   Nicholes Stairs 09/23/2017, 9:53  AM   Geralynn Rile, MD [phone]

## 2017-09-23 NOTE — Discharge Summary (Signed)
Patient ID: [name] MRN: [id number] DOB/AGE: [birth year] 67 y.o.  Admit date: 09/21/2017 Discharge date:   Primary Diagnosis: Left shoulder arthritis Admission Diagnoses:  Past Medical History:  Diagnosis Date  . ADD (attention deficit disorder)   . Arthritis    "bunion on  right" (08/11/2014)  . Chronic bronchitis ([place])    "plenty; but not q yr" (08/11/2014)  . Depression   . Dyslipidemia   . Environmental allergies   . GERD (gastroesophageal reflux disease)   . Hearing difficulty of left ear    "grew up w/deaf parent; find myself lip reading more recently" (08/11/2014)  . Hepatitis    "exposed in college; rec'd gamma globulin; 6 months later S/S (weakness, lethargy, fevers); ; dx'd infectious hepatitis"  . History of herpes genitalis    "stress induced reoccurance that shows up on my left middle finger" (08/11/2014)  . History of hiatal hernia   . History of stomach ulcers   . Hypercholesterolemia   . Hyperthyroidism 1994   "postpartum only; resolved itself"  . Incisional hernia   . Internal hemorrhoids   . Leg cramping   . Osteopenia   . Seasonal asthma   . Stress incontinence, female   . Ulcer    Discharge Diagnoses:   Principal Problem:   Osteoarthritis, localized, shoulder, left Active Problems:   History of left shoulder replacement  Estimated body mass index is 35.9 kg/m as calculated from the following:   Height as of this encounter: 5' 1"  (1.549 m).   Weight as of this encounter: 86.2 kg.  Procedure:  Procedure(s) (LRB): LEFT TOTAL SHOULDER ARTHROPLASTY (Left)   Consults: None  HPI: [name] presented to the hospital for elective left total shoulder arthroplasty.  She had failed conservative measures prior to presentation.  She elected for definitive management with left shoulder arthroplasty. Laboratory Data: Admission on 09/21/2017  Component Date Value Ref Range Status  . WBC 09/21/2017 11.5* 4.0 - 10.5 K/uL Final  . RBC 09/21/2017 4.09  3.87 -  5.11 MIL/uL Final  . Hemoglobin 09/21/2017 11.7* 12.0 - 15.0 g/dL Final  . HCT 09/21/2017 37.1  36.0 - 46.0 % Final  . MCV 09/21/2017 90.7  78.0 - 100.0 fL Final  . MCH 09/21/2017 28.6  26.0 - 34.0 pg Final  . MCHC 09/21/2017 31.5  30.0 - 36.0 g/dL Final  . RDW 09/21/2017 14.7  11.5 - 15.5 % Final  . Platelets 09/21/2017 329  150 - 400 K/uL Final   Performed at [facility], [place] [address], [place], [place] [address]  . Creatinine, Ser 09/21/2017 0.80  0.44 - 1.00 mg/dL Final  . GFR calc non Af Amer 09/21/2017 >60  >60 mL/min Final  . GFR calc Af Amer 09/21/2017 >60  >60 mL/min Final   Comment: (NOTE) The eGFR has been calculated using the CKD EPI equation. This calculation has not been validated in all clinical situations. eGFR's persistently <60 mL/min signify possible Chronic Kidney Disease. Performed at [facility], [place] [address] [place], [place] [address]   . WBC 09/22/2017 11.3* 4.0 - 10.5 K/uL Final  . RBC 09/22/2017 3.68* 3.87 - 5.11 MIL/uL Final  . Hemoglobin 09/22/2017 10.4* 12.0 - 15.0 g/dL Final  . HCT 09/22/2017 33.3* 36.0 - 46.0 % Final  . MCV 09/22/2017 90.5  78.0 - 100.0 fL Final  . MCH 09/22/2017 28.3  26.0 - 34.0 pg Final  . MCHC 09/22/2017 31.2  30.0 - 36.0 g/dL Final  . RDW  09/22/2017 14.6  11.5 - 15.5 % Final  . Platelets 09/22/2017 326  150 - 400 K/uL Final   Performed at [facility], [place] [address], [place], [place] [address]  . Sodium 09/22/2017 139  135 - 145 mmol/L Final  . Potassium 09/22/2017 4.9  3.5 - 5.1 mmol/L Final  . Chloride 09/22/2017 105  98 - 111 mmol/L Final  . CO2 09/22/2017 27  22 - 32 mmol/L Final  . Glucose, Bld 09/22/2017 118* 70 - 99 mg/dL Final  . BUN 09/22/2017 21  8 - 23 mg/dL Final  . Creatinine, Ser 09/22/2017 0.85  0.44 - 1.00 mg/dL Final  . Calcium 09/22/2017 8.8* 8.9 - 10.3 mg/dL Final  . GFR calc non Af Amer 09/22/2017 >60  >60 mL/min Final  . GFR calc Af Amer 09/22/2017 >60  >60 mL/min Final    Comment: (NOTE) The eGFR has been calculated using the CKD EPI equation. This calculation has not been validated in all clinical situations. eGFR's persistently <60 mL/min signify possible Chronic Kidney Disease.   [name] gap 09/22/2017 7  5 - 15 Final   Performed at [facility], [place] [address] [place], [place] [address]  Hospital Outpatient Visit on 09/10/2017  Component Date Value Ref Range Status  . Sodium 09/10/2017 138  135 - 145 mmol/L Final  . Potassium 09/10/2017 4.3  3.5 - 5.1 mmol/L Final  . Chloride 09/10/2017 103  98 - 111 mmol/L Final  . CO2 09/10/2017 26  22 - 32 mmol/L Final  . Glucose, Bld 09/10/2017 103* 70 - 99 mg/dL Final  . BUN 09/10/2017 10  8 - 23 mg/dL Final  . Creatinine, Ser 09/10/2017 0.78  0.44 - 1.00 mg/dL Final  . Calcium 09/10/2017 9.2  8.9 - 10.3 mg/dL Final  . GFR calc non Af Amer 09/10/2017 >60  >60 mL/min Final  . GFR calc Af Amer 09/10/2017 >60  >60 mL/min Final   Comment: (NOTE) The eGFR has been calculated using the CKD EPI equation. This calculation has not been validated in all clinical situations. eGFR's persistently <60 mL/min signify possible Chronic Kidney Disease.   [name] gap 09/10/2017 9  5 - 15 Final   Performed at [facility], [place] [address], [place], [place] [address]  . WBC 09/10/2017 6.7  4.0 - 10.5 K/uL Final  . RBC 09/10/2017 4.54  3.87 - 5.11 MIL/uL Final  . Hemoglobin 09/10/2017 12.9  12.0 - 15.0 g/dL Final  . HCT 09/10/2017 40.8  36.0 - 46.0 % Final  . MCV 09/10/2017 89.9  78.0 - 100.0 fL Final  . MCH 09/10/2017 28.4  26.0 - 34.0 pg Final  . MCHC 09/10/2017 31.6  30.0 - 36.0 g/dL Final  . RDW 09/10/2017 14.7  11.5 - 15.5 % Final  . Platelets 09/10/2017 338  150 - 400 K/uL Final   Performed at [facility], [place] [address] [place], [place] [address]  . MRSA, PCR 09/10/2017 NEGATIVE  NEGATIVE Final  . Staphylococcus aureus 09/10/2017 NEGATIVE  NEGATIVE Final   Comment: (NOTE) The Xpert SA Assay (FDA  approved for NASAL specimens in patients 83 years of age and older), is one component of a comprehensive surveillance program. It is not intended to diagnose infection nor to guide or monitor treatment. Performed at [facility], [place] [address], [place],  [address]   Abstract on 08/24/2017  Component Date Value Ref Range Status  . HM Mammogram 08/14/2017  0-4 Bi-Rad  0-4 Bi-Rad, Self Reported Normal Final     X-Rays:Dg Shoulder Left Port  Result Date: 09/21/2017 CLINICAL DATA:  Status post left shoulder replacement. EXAM: LEFT SHOULDER - 1 VIEW COMPARISON:  CT scan of April 02, 2017. FINDINGS: Left shoulder prosthesis appears to be well situated. No fracture or dislocation is noted. Visualized ribs are unremarkable. IMPRESSION: Status post left shoulder arthroplasty. Electronically Signed   By: Marijo Conception, M.D.   On: 09/21/2017 17:10    EKG: Orders placed or performed in visit on 07/02/17  . EKG 12-Lead     Hospital Course: [name] is a 67 y.o. who was admitted to Hospital. They were brought to the operating room on 09/21/2017 and underwent Procedure(s): LEFT TOTAL SHOULDER ARTHROPLASTY.  Patient tolerated the procedure well and was later transferred to the recovery room and then to the orthopaedic floor for postoperative care.  They were given PO and IV analgesics for pain control following their surgery.  They were given 24 hours of postoperative antibiotics of  Anti-infectives (From admission, onward)   Start     Dose/Rate Route Frequency Ordered Stop   09/21/17 1700  ceFAZolin (ANCEF) IVPB 1 g/50 mL premix     1 g 100 mL/hr over 30 Minutes Intravenous Every 6 hours 09/21/17 1629 09/22/17 0625   09/21/17 1045  ceFAZolin (ANCEF) IVPB 2g/100 mL premix     2 g 200 mL/hr over 30 Minutes Intravenous On call to O.R. 09/21/17 1031 09/21/17 1322   09/21/17 1034  ceFAZolin (ANCEF) 2-4 GM/100ML-% IVPB    Note to Pharmacy:  Lisette Grinder   : cabinet override       09/21/17 1034 09/21/17 1322     and started on DVT prophylaxis in the form of Lovenox.   PT and OT were ordered for total joint protocol.  Discharge planning consulted to help with postop disposition and equipment needs.  Patient had a good night on the evening of surgery.   By day two, the patient had progressed with therapy and meeting their goals.  Incision was healing well.  Patient was seen in rounds and was ready to go home.   Diet: Regular diet Activity:NWB Follow-up:in 2 weeks Disposition - Home Discharged Condition: good   Discharge Instructions    Call MD / Call 911   Complete by:  As directed    If you experience chest pain or shortness of breath, CALL 911 and be transported to the hospital emergency room.  If you develope a fever above 101 F, pus (white drainage) or increased drainage or redness at the wound, or calf pain, call your surgeon's office.   Constipation Prevention   Complete by:  As directed    Drink plenty of fluids.  Prune juice may be helpful.  You may use a stool softener, such as Colace (over the counter) 100 mg twice a day.  Use MiraLax (over the counter) for constipation as needed.   Diet - low sodium heart healthy   Complete by:  As directed    Increase activity slowly as tolerated   Complete by:  As directed      Allergies as of 09/23/2017      Reactions   Aspirin Other (See Comments)   GI Intolerance; History of ulcers; GI BLEED   Bee Venom Swelling   Any insects that bite or stings- swelling at site of sting or cellulitis can develop   Nsaids Other (See Comments)   History of  ulcers      Medication List    TAKE these medications   acetaminophen 650 MG CR tablet Commonly known as:  TYLENOL Take 1,300 mg by mouth See admin instructions. Take 1,300 mg by mouth at bedtime and may also take an additional 1,300 mg one to two times a day as needed for pain   albuterol 108 (90 Base) MCG/ACT inhaler Commonly known as:  PROVENTIL HFA;VENTOLIN  HFA INHALE 2 PUFFS INTO THE LUNGS EVERY 6 (SIX) HOURS AS NEEDED *EMERGENCY FILL* What changed:    how much to take  how to take this  when to take this  reasons to take this  additional instructions   amphetamine-dextroamphetamine 20 MG tablet Commonly known as:  ADDERALL Take 1 tablet (20 mg total) by mouth 2 (two) times daily. What changed:    when to take this  additional instructions   benzonatate 100 MG capsule Commonly known as:  TESSALON Take 100 mg by mouth every 8 (eight) hours as needed for cough.   cyclobenzaprine 10 MG tablet Commonly known as:  FLEXERIL Take 10 mg by mouth at bedtime as needed (for sleep or pain).   DULoxetine 30 MG capsule Commonly known as:  CYMBALTA Take 1 capsule (30 mg total) by mouth daily. What changed:  when to take this   DULoxetine 60 MG capsule Commonly known as:  CYMBALTA Take 1 capsule (60 mg total) by mouth daily. What changed:  when to take this   esomeprazole 40 MG capsule Commonly known as:  NEXIUM Take 1 capsule (40 mg total) by mouth daily at 12 noon. What changed:  when to take this   glucosamine-chondroitin 500-400 MG tablet Take 1 tablet by mouth 3 (three) times daily.   HYDROcodone-acetaminophen 10-325 MG tablet Commonly known as:  NORCO Take 1 tablet by mouth every 8 (eight) hours as needed. What changed:  reasons to take this   ipratropium 0.03 % nasal spray Commonly known as:  ATROVENT Place 2 sprays into the nose 4 (four) times daily. Use as needed for runny nose and congestion What changed:    when to take this  reasons to take this  additional instructions   loratadine 10 MG tablet Commonly known as:  CLARITIN Take 1 tablet (10 mg total) by mouth daily.   mometasone 50 MCG/ACT nasal spray Commonly known as:  NASONEX INHALE 2 SPRAYS INTO THE NOSE DAILY AS NEEDED What changed:  See the new instructions.   MSM PO Take 1 capsule by mouth See admin instructions. Take 1 capsule by mouth two  to three times a day with food   ondansetron 4 MG tablet Commonly known as:  ZOFRAN Take 4 mg by mouth every 8 (eight) hours as needed for nausea or vomiting.   ONE-A-DAY WOMENS 50 PLUS Tabs Take 1 tablet by mouth daily.   oxyCODONE 5 MG immediate release tablet Commonly known as:  Oxy IR/ROXICODONE Take 1-2 tablets by mouth every 4 hours for moderate to severe pain.   simvastatin 40 MG tablet Commonly known as:  ZOCOR Take 1 tablet (40 mg total) by mouth daily. What changed:  when to take this   UNABLE TO FIND Bone Strength New [place] calcium tablets: Take 3 tablets by mouth daily      Follow-up Information    Nicholes Stairs, MD In 2 weeks.   Specialty:  Orthopedic Surgery Why:  For wound re-check Contact information: [address] [place] [address] [phone]  Signed: Geralynn Rile, MD Orthopaedic Surgery 09/23/2017, 10:00 AM

## 2017-09-23 NOTE — Progress Notes (Signed)
[name] to be D/C'd Home per MD order.  Discussed prescriptions and follow up appointments with the patient. Prescriptions given to patient, medication list explained in detail. Pt verbalized understanding.  Allergies as of 09/23/2017      Reactions   Aspirin Other (See Comments)   GI Intolerance; History of ulcers; GI BLEED   Bee Venom Swelling   Any insects that bite or stings- swelling at site of sting or cellulitis can develop   Nsaids Other (See Comments)   History of ulcers      Medication List    TAKE these medications   acetaminophen 650 MG CR tablet Commonly known as:  TYLENOL Take 1,300 mg by mouth See admin instructions. Take 1,300 mg by mouth at bedtime and may also take an additional 1,300 mg one to two times a day as needed for pain   albuterol 108 (90 Base) MCG/ACT inhaler Commonly known as:  PROVENTIL HFA;VENTOLIN HFA INHALE 2 PUFFS INTO THE LUNGS EVERY 6 (SIX) HOURS AS NEEDED *EMERGENCY FILL* What changed:    how much to take  how to take this  when to take this  reasons to take this  additional instructions   amphetamine-dextroamphetamine 20 MG tablet Commonly known as:  ADDERALL Take 1 tablet (20 mg total) by mouth 2 (two) times daily. What changed:    when to take this  additional instructions   benzonatate 100 MG capsule Commonly known as:  TESSALON Take 100 mg by mouth every 8 (eight) hours as needed for cough.   cyclobenzaprine 10 MG tablet Commonly known as:  FLEXERIL Take 10 mg by mouth at bedtime as needed (for sleep or pain).   DULoxetine 30 MG capsule Commonly known as:  CYMBALTA Take 1 capsule (30 mg total) by mouth daily. What changed:  when to take this   DULoxetine 60 MG capsule Commonly known as:  CYMBALTA Take 1 capsule (60 mg total) by mouth daily. What changed:  when to take this   esomeprazole 40 MG capsule Commonly known as:  NEXIUM Take 1 capsule (40 mg total) by mouth daily at 12 noon. What changed:  when to  take this   glucosamine-chondroitin 500-400 MG tablet Take 1 tablet by mouth 3 (three) times daily.   HYDROcodone-acetaminophen 10-325 MG tablet Commonly known as:  NORCO Take 1 tablet by mouth every 8 (eight) hours as needed. What changed:  reasons to take this   ipratropium 0.03 % nasal spray Commonly known as:  ATROVENT Place 2 sprays into the nose 4 (four) times daily. Use as needed for runny nose and congestion What changed:    when to take this  reasons to take this  additional instructions   loratadine 10 MG tablet Commonly known as:  CLARITIN Take 1 tablet (10 mg total) by mouth daily.   mometasone 50 MCG/ACT nasal spray Commonly known as:  NASONEX INHALE 2 SPRAYS INTO THE NOSE DAILY AS NEEDED What changed:  See the new instructions.   MSM PO Take 1 capsule by mouth See admin instructions. Take 1 capsule by mouth two to three times a day with food   ondansetron 4 MG tablet Commonly known as:  ZOFRAN Take 4 mg by mouth every 8 (eight) hours as needed for nausea or vomiting.   ONE-A-DAY WOMENS 50 PLUS Tabs Take 1 tablet by mouth daily.   oxyCODONE 5 MG immediate release tablet Commonly known as:  Oxy IR/ROXICODONE Take 1-2 tablets by mouth every 4 hours for moderate  to severe pain.   simvastatin 40 MG tablet Commonly known as:  ZOCOR Take 1 tablet (40 mg total) by mouth daily. What changed:  when to take this   UNABLE TO FIND Bone Strength New Chapter calcium tablets: Take 3 tablets by mouth daily       Vitals:   09/23/17 0506 09/23/17 0856  BP: 129/81 127/74  Pulse: 90 87  Resp: 17 17  Temp: 99.2 F (37.3 C) 99.2 F (37.3 C)  SpO2: 97% 98%    Skin clean, dry and intact without evidence of skin break down, no evidence of skin tears noted. Aquacel dressing in place, clean, dry, and intact. Sling on arm in correct position. IV catheter discontinued intact. Site without signs and symptoms of complications. Dressing and pressure applied. Pt denies  pain at this time. No complaints noted.  An After Visit Summary was printed and given to the patient. Patient escorted via [place], and D/C home via private auto.  [place] RN

## 2017-09-23 NOTE — Progress Notes (Signed)
Occupational Therapy Treatment Patient Details Name: [name] MRN: [id number] DOB: March 17, 1950 Today's Date: 09/23/2017    History of present illness [name] is a 67 y.o. female who complains of left shoulder arthritis that has failed to improve with conservative measures.  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  Pt with History of ADD and HOH left ear and several toe surgeries on right foot - most recent in July 2019   OT comments  Patient progressing well.  Continues to require increased time and verbal cueing for attention to tasks (easily distracted), but daughter demonstrates ability to assist patient completing UB dressing with mod assist and sling management without cueing.  Continued educated on precautions, safety, sling wear schedule, positioning and exercises.  Initially required cueing to avoid shoulder elevation of R shoulder, but improved throughout session.  Completed exercises with supervision.  With daughters support, patient able to dc home. Will continue to follow.    Follow Up Recommendations  Follow surgeon's recommendation for DC plan and follow-up therapies;Supervision/Assistance - 24 hour    Equipment Recommendations  None recommended by OT    Recommendations for Other Services      Precautions / Restrictions Precautions Precautions: Fall;Shoulder Type of Shoulder Precautions: Conservative protocol: sling at all times except ADL/exercise, AROM L elbow/wrist/hand, no AROM/PROM L shoulder.  Shoulder Interventions: Shoulder sling/immobilizer;Off for dressing/bathing/exercises Precaution Booklet Issued: Yes (comment) Precaution Comments: pt has history of falls - she has fallen at least 3 times in last 3 months - esp on uneven terrain. Required Braces or Orthoses: Other Brace/Splint Other Brace/Splint: L shoulder sling at all times Restrictions Weight Bearing Restrictions: Yes LUE Weight Bearing: Non weight bearing       Mobility Bed Mobility               General bed mobility comments: seated OOB in recliner upon entry  Transfers Overall transfer level: Needs assistance Equipment used: None Transfers: Sit to/from Stand Sit to Stand: Supervision         General transfer comment: for safety    Balance Overall balance assessment: History of Falls;Mild deficits observed, not formally tested                                         ADL either performed or assessed with clinical judgement   ADL Overall ADL's : Needs assistance/impaired                 Upper Body Dressing : Moderate assistance;Sitting;Cueing for compensatory techniques;Adhering to UE precautions Upper Body Dressing Details (indicate cue type and reason): daughter able to assist with technique, increased assistance with tight tank top but min assist for loose shirt     Toilet Transfer: Supervision/safety;Ambulation(simulated to recliner )           Functional mobility during ADLs: Supervision/safety General ADL Comments: daughter supportive and able to assist, min cueing for patient to recall techniques but daughter able to assist     Vision   Vision Assessment?: No apparent visual deficits   Perception     Praxis      Cognition Arousal/Alertness: Awake/alert Behavior During Therapy: Impulsive Overall Cognitive Status: History of cognitive impairments - at baseline  General Comments: cueing for attention to task, easily distracted        Exercises Exercises: Shoulder;Other exercises Shoulder Exercises Elbow Flexion: AROM;Left;10 reps;Seated Elbow Extension: AROM;Left;10 reps;Seated Wrist Flexion: AROM;Left;10 reps;Seated Wrist Extension: AROM;Left;10 reps;Seated Digit Composite Flexion: AROM;Left;10 reps;Seated Composite Extension: AROM;Left;10 reps;Seated Other Exercises Other Exercises: AROM L forearm supination/pronation   Shoulder Instructions Shoulder  Instructions Donning/doffing shirt without moving shoulder: Moderate assistance Donning/doffing sling/immobilizer: Modified independent;Caregiver independent with task Correct positioning of sling/immobilizer: Modified independent;Caregiver independent with task ROM for elbow, wrist and digits of operated UE: Supervision/safety Sling wearing schedule (on at all times/off for ADL's): Supervision/safety Proper positioning of operated UE when showering: Supervision/safety     General Comments      Pertinent Vitals/ Pain       Pain Assessment: Faces Faces Pain Scale: Hurts little more Pain Location: left shoulder Pain Descriptors / Indicators: Aching;Discomfort;Operative site guarding Pain Intervention(s): Limited activity within patient's tolerance;Monitored during session;Premedicated before session  Home Living                                          Prior Functioning/Environment              Frequency  Min 2X/week        Progress Toward Goals  OT Goals(current goals can now be found in the care plan section)  Progress towards OT goals: Progressing toward goals  Acute Rehab OT Goals Patient Stated Goal: to get home and heal - wtih daughters assist OT Goal Formulation: With patient Time For Goal Achievement: 10/06/17 Potential to Achieve Goals: Good  Plan Discharge plan remains appropriate;Frequency remains appropriate    Co-evaluation                 AM-PAC PT "6 Clicks" Daily Activity     Outcome Measure   Help from another person eating meals?: A Little Help from another person taking care of personal grooming?: A Little Help from another person toileting, which includes using toliet, bedpan, or urinal?: A Little Help from another person bathing (including washing, rinsing, drying)?: A Lot Help from another person to put on and taking off regular upper body clothing?: A Lot Help from another person to put on and taking off regular  lower body clothing?: A Little 6 Click Score: 16    End of Session Equipment Utilized During Treatment: Other (comment)(sling immobilizer)  OT Visit Diagnosis: Other abnormalities of gait and mobility (R26.89);Pain Pain - Right/Left: Left Pain - part of body: Shoulder   Activity Tolerance Patient tolerated treatment well   Patient Left in chair;with call bell/phone within reach;with family/visitor present   Nurse Communication Mobility status        Time: 9211-9417 OT Time Calculation (min): 19 min  Charges: OT General Charges $OT Visit: 1 Visit OT Treatments $Self Care/Home Management : 8-22 mins  Delight Stare, OTR/L  Pager [place] 09/23/2017, 12:41 PM

## 2017-09-24 ENCOUNTER — Encounter (HOSPITAL_COMMUNITY): Payer: Self-pay | Admitting: Orthopedic Surgery

## 2017-10-02 ENCOUNTER — Encounter: Payer: Self-pay | Admitting: Family Medicine

## 2017-10-02 ENCOUNTER — Ambulatory Visit (INDEPENDENT_AMBULATORY_CARE_PROVIDER_SITE_OTHER): Payer: BC Managed Care – PPO | Admitting: Family Medicine

## 2017-10-02 ENCOUNTER — Ambulatory Visit (INDEPENDENT_AMBULATORY_CARE_PROVIDER_SITE_OTHER): Payer: BC Managed Care – PPO

## 2017-10-02 VITALS — BP 124/84 | HR 97 | Temp 98.1°F | Wt 194.0 lb

## 2017-10-02 DIAGNOSIS — Z23 Encounter for immunization: Secondary | ICD-10-CM

## 2017-10-02 DIAGNOSIS — M25551 Pain in right hip: Secondary | ICD-10-CM

## 2017-10-02 NOTE — Progress Notes (Signed)
[name] is a 67 y.o. female who presents to [facility] today for butt/hip pain.   She has had about a 2 month history of pain in her right buttock. She describes it as a sharp stabbing pain right in the middle of the buttock. It is the worst when she goes to stand up from a seated position. She has a history of sciatica but says that this pain is different. It is a achy pain and does not shoot down her leg such as in sciatica. She says that over the past couple weeks she has also noticed the pain in her right groin area at the hip joint. This pain is also stabbing and does not radiate. She denies numbness or tingling. She endorses some bladder incontinence but says that she has had that long before her pain started. She is concerned about a hernia because she has a history of both inguinal and incisional hernias both of which were surgically repaired.   Of note, she had a left total shoulder arthroplasty on 8/23. She is recovering well and only taking tylenol and her regular Cymbalta for pain. She did increase her dose of Cymbalta from 60 to 90 mg after her surgery.     ROS:  As above  Exam:  BP 128/80   Pulse 97   Temp 98.1 F (36.7 C) (Oral)   Wt 194 lb (88 kg)   LMP 08/30/2008   BMI 36.66 kg/m  General: Well Developed, well nourished, and in no acute distress.  Neuro/Psych: Alert and oriented x3, extra-ocular muscles intact, able to move all 4 extremities, sensation grossly intact. Skin: Warm and dry, no rashes noted.  Respiratory: Not using accessory muscles, speaking in full sentences, trachea midline.  Cardiovascular: Pulses palpable, no extremity edema. Abdomen: Does not appear distended. Right hip: normal appearing with no obvious deformities. No bulging with valsalva in the femoral or inguinal areas.  Non tender to palpation diffusely. Normal ROM, normal strength.  Mild pain with hip flexion.  Right Buttock: normal  appearing with no obvious deformities.  Non tender to palpation diffusely.    Lab and Radiology Results X-ray images right hip personally independently reviewed Small accessory ossicle or old avulsion fracture at superior aspect of greater trochanter visible.  No severe DJD present.  No acute fractures. Await formal radiology review    Assessment and Plan: 67 y.o. female with right buttock/hip pain. 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 Her symptoms are consistent with piriformis pathology although she does not have focal tenderness to palpation over the piriformis. Her x ray of her pelvis did not show a fracture although still awaiting a radiology report. The plan will be to do physical therapy and we will consider an MRI in the future if the symptoms persist. She should continue to take tylenol and her current dose of Cymbalta. She is also taking Flexeril since her surgery and should continue this as well.   She had her high dose flu vaccination today.    Orders Placed This Encounter  Procedures  . DG HIP UNILAT WITH PELVIS 2-3 VIEWS RIGHT    Standing Status:   Future    Number of Occurrences:   1    Standing Expiration Date:   12/03/2018    Order Specific Question:   Reason for Exam (SYMPTOM  OR DIAGNOSIS REQUIRED)    Answer:   eval hip pain    Order Specific Question:   Preferred imaging  location?    Answer:   [facility]    Order Specific Question:   Radiology Contrast Protocol - do NOT remove file path    Answer:   [email or web address]  . Ambulatory referral to Physical Therapy    Referral Priority:   Routine    Referral Type:   Physical Medicine    Referral Reason:   Specialty Services Required    Requested Specialty:   Physical Therapy   No orders of the defined types were placed in this encounter.   Historical information moved to improve visibility of documentation.  Past Medical History:  Diagnosis Date  . ADD  (attention deficit disorder)   . Arthritis    "bunion on  right" (08/11/2014)  . Chronic bronchitis ([place])    "plenty; but not q yr" (08/11/2014)  . Depression   . Dyslipidemia   . Environmental allergies   . GERD (gastroesophageal reflux disease)   . Hearing difficulty of left ear    "grew up w/deaf parent; find myself lip reading more recently" (08/11/2014)  . Hepatitis    "exposed in college; rec'd gamma globulin; 6 months later S/S (weakness, lethargy, fevers); ; dx'd infectious hepatitis"  . History of herpes genitalis    "stress induced reoccurance that shows up on my left middle finger" (08/11/2014)  . History of hiatal hernia   . History of stomach ulcers   . Hypercholesterolemia   . Hyperthyroidism 1994   "postpartum only; resolved itself"  . Incisional hernia   . Internal hemorrhoids   . Leg cramping   . Osteopenia   . Seasonal asthma   . Stress incontinence, female   . Ulcer    Past Surgical History:  Procedure Laterality Date  . CESAREAN SECTION  1994  . CHALAZION EXCISION Left ~ 2013  . DILATION AND CURETTAGE OF UTERUS  X 2  . EYE SURGERY    . HERNIA REPAIR  08/11/2014  . INCISIONAL HERNIA REPAIR N/A 08/11/2014   Procedure: LAPAROSCOPIC INCISIONAL HERNIA REPAIR;  Surgeon: Coralie Keens, MD;  Location: [facility];  Service: General;  Laterality: N/A;  . INCONTINENCE SURGERY  2005  . INGUINAL HERNIA REPAIR Left 08/11/2014  . INGUINAL HERNIA REPAIR Left 08/11/2014   Procedure: LEFT INGUINAL HERNIA REPAIR;  Surgeon: Coralie Keens, MD;  Location: [facility];  Service: General;  Laterality: Left;  . INSERTION OF MESH Left 08/11/2014   Procedure: INSERTION OF MESH TO LEFT GROIN AND ABDOMEN;  Surgeon: Coralie Keens, MD;  Location: [facility];  Service: General;  Laterality: Left;  . LAPAROSCOPIC INCISIONAL / UMBILICAL / VENTRAL HERNIA REPAIR  08/11/2014   [facility]  . NASAL POLYP EXCISION  2014  . REPAIR OF PERFORATED ULCER  1985  . TONSILLECTOMY    . TOTAL SHOULDER ARTHROPLASTY Left  09/21/2017   Procedure: LEFT TOTAL SHOULDER ARTHROPLASTY;  Surgeon: Nicholes Stairs, MD;  Location: [facility];  Service: Orthopedics;  Laterality: Left;   Social History   Tobacco Use  . Smoking status: Never Smoker  . Smokeless tobacco: Never Used  Substance Use Topics  . Alcohol use: Yes    Alcohol/week: 2.0 standard drinks    Types: 1 Glasses of wine, 1 Shots of liquor per week    Comment: 08/11/2014 "0-2 glasses of wine or a mixed drink q wk"   family history includes Arthritis in her father and mother; Cancer in her father; Diabetes in her maternal grandmother and mother; Heart disease in her mother; Hyperlipidemia in her mother; Lymphoma in her paternal  grandmother; Lymphoma (age of onset: 73) in her father; Osteoporosis in her mother.  Medications: Current Outpatient Medications  Medication Sig Dispense Refill  . acetaminophen (TYLENOL) 650 MG CR tablet Take 1,300 mg by mouth See admin instructions. Take 1,300 mg by mouth at bedtime and may also take an additional 1,300 mg one to two times a day as needed for pain    . albuterol (PROAIR HFA) 108 (90 Base) MCG/ACT inhaler INHALE 2 PUFFS INTO THE LUNGS EVERY 6 (SIX) HOURS AS NEEDED *EMERGENCY FILL* (Patient taking differently: Inhale 2 puffs into the lungs every 6 (six) hours as needed for wheezing or shortness of breath. ) 8.5 Inhaler 3  . amphetamine-dextroamphetamine (ADDERALL) 20 MG tablet Take 1 tablet (20 mg total) by mouth 2 (two) times daily. (Patient taking differently: Take 20 mg by mouth See admin instructions. Take 20 mg by mouth one to two times a day) 180 tablet 0  . benzonatate (TESSALON) 100 MG capsule Take 100 mg by mouth every 8 (eight) hours as needed for cough.     . cyclobenzaprine (FLEXERIL) 10 MG tablet Take 10 mg by mouth at bedtime as needed (for sleep or pain).     . DULoxetine (CYMBALTA) 30 MG capsule Take 1 capsule (30 mg total) by mouth daily. (Patient taking differently: Take 30 mg by mouth at bedtime. ) 90  capsule 3  . DULoxetine (CYMBALTA) 60 MG capsule Take 1 capsule (60 mg total) by mouth daily. (Patient taking differently: Take 60 mg by mouth every morning. ) 90 capsule 1  . esomeprazole (NEXIUM) 40 MG capsule Take 1 capsule (40 mg total) by mouth daily at 12 noon. (Patient taking differently: Take 40 mg by mouth daily. ) 90 capsule 1  . glucosamine-chondroitin 500-400 MG tablet Take 1 tablet by mouth 3 (three) times daily.    Marland Kitchen HYDROcodone-acetaminophen (NORCO) 10-325 MG tablet Take 1 tablet by mouth every 8 (eight) hours as needed. (Patient taking differently: Take 1 tablet by mouth every 8 (eight) hours as needed (for pain). ) 10 tablet 0  . ipratropium (ATROVENT) 0.03 % nasal spray Place 2 sprays into the nose 4 (four) times daily. Use as needed for runny nose and congestion (Patient taking differently: Place 2 sprays into the nose 4 (four) times daily as needed (for a runny nose or congestion). ) 30 mL 1  . loratadine (CLARITIN) 10 MG tablet Take 1 tablet (10 mg total) by mouth daily. 90 tablet 3  . Methylsulfonylmethane (MSM PO) Take 1 capsule by mouth See admin instructions. Take 1 capsule by mouth two to three times a day with food    . mometasone (NASONEX) 50 MCG/ACT nasal spray INHALE 2 SPRAYS INTO THE NOSE DAILY AS NEEDED (Patient taking differently: Place 2 sprays into the nose daily as needed (for inflammation). ) 17 g 5  . Multiple Vitamins-Minerals (ONE-A-DAY WOMENS 50 PLUS) TABS Take 1 tablet by mouth daily.    . ondansetron (ZOFRAN) 4 MG tablet Take 4 mg by mouth every 8 (eight) hours as needed for nausea or vomiting.   0  . oxyCODONE (OXY IR/ROXICODONE) 5 MG immediate release tablet Take 1-2 tablets by mouth every 4 hours for moderate to severe pain.    . simvastatin (ZOCOR) 40 MG tablet Take 1 tablet (40 mg total) by mouth daily. (Patient taking differently: Take 40 mg by mouth at bedtime. ) 90 tablet 1  . UNABLE TO FIND Bone Strength [place] calcium tablets: Take 3 tablets by  mouth  daily     No current facility-administered medications for this visit.    Allergies  Allergen Reactions  . Aspirin Other (See Comments)    GI Intolerance; History of ulcers; GI BLEED  . Bee Venom Swelling    Any insects that bite or stings- swelling at site of sting or cellulitis can develop  . Nsaids Other (See Comments)    History of ulcers      Discussed warning signs or symptoms. Please see discharge instructions. Patient expresses understanding.  I personally was present and performed or re-performed the history, physical exam and medical decision-making activities of this service and have verified that the service and findings are accurately documented in the student's note. ___________________________________________ Lynne Leader M.D., ABFM., CAQSM. Primary Care and Sports Medicine Adjunct Instructor of [employer]

## 2017-10-02 NOTE — Patient Instructions (Addendum)
Thank you for coming in today. Attend PT for the buttock and hip pain.  I will get results to you ASAP.  Recheck with me in about 4 weeks.  Return sooner if needed.    Piriformis Syndrome Piriformis syndrome is a condition that can cause pain and numbness in your buttocks and down the back of your leg. Piriformis syndrome happens when the small muscle that connects the base of your spine to your hip (piriformis muscle) presses on the nerve that runs down the back of your leg (sciatic nerve). The piriformis muscle helps your hip rotate and helps to bring your leg back and out. It also helps shift your weight while you are walking to keep you stable. The sciatic nerve runs under or through the piriformis. Damage to the piriformis muscle can cause spasms that put pressure on the nerve below. This causes pain and discomfort while sitting and moving. The pain may feel as if it begins in the buttock and spreads (radiates) down your hip and thigh. What are the causes? This condition is caused by pressure on the sciatic nerve from the piriformis muscle. The piriformis muscle can get irritated with overuse, especially if other hip muscles are weak and the piriformis has to do extra work. Piriformis syndrome can also occur after an injury, like a fall onto your buttocks. What increases the risk? This condition is more likely to develop in:  Women.  People who sit for long periods of time.  Cyclists.  People who have weak buttocks muscles (gluteal muscles).  What are the signs or symptoms? Pain, tingling, or numbness that starts in the buttock and runs down the back of your leg (sciatica) is the most common symptom of this condition. Your symptoms may:  Get worse the longer you sit.  Get worse when you walk, run, or go up on stairs.  How is this diagnosed? This condition is diagnosed based on your symptoms, medical history, and physical exam. During this exam, your health care provider may move  your leg into different positions to check for pain. He or she will also press on the muscles of your hip and buttock to see if that increases your symptoms. You may also have an X-ray or MRI. How is this treated? Treatment for this condition may include:  Stopping all activities that cause pain or make your condition worse.  Using heat or ice to relieve pain as told by your health care provider.  Taking medicines to reduce pain and swelling.  Taking a muscle relaxer to release the piriformis muscle.  Doing range-of-motion and strengthening exercises (physical therapy) as told by your health care provider.  Massaging the affected area.  Getting an injection of an anti-inflammatory medicine or muscle relaxer to reduce inflammation and muscle tension.  In rare cases, you may need surgery to cut the muscle and release pressure on the nerve if other treatments do not work. Follow these instructions at home:  Take over-the-counter and prescription medicines only as told by your health care provider.  Do not sit for long periods. Get up and walk around every 20 minutes or as often as told by your health care provider.  If directed, apply heat to the affected area as often as told by your health care provider. Use the heat source that your health care provider recommends, such as a moist heat pack or a heating pad. ? Place a towel between your skin and the heat source. ? Leave the heat on  for 20-30 minutes. ? Remove the heat if your skin turns bright red. This is especially important if you are unable to feel pain, heat, or cold. You may have a greater risk of getting burned.  If directed, apply ice to the injured area. ? Put ice in a plastic bag. ? Place a towel between your skin and the bag. ? Leave the ice on for 20 minutes, 2-3 times a day.  Do exercises as told by your health care provider.  Return to your normal activities as told by your health care provider. Ask your health care  provider what activities are safe for you.  Keep all follow-up visits as told by your health care provider. This is important. How is this prevented?  Do not sit for longer than 20 minutes at a time. When you sit, choose padded surfaces.  Warm up and stretch before being active.  Cool down and stretch after being active.  Give your body time to rest between periods of activity.  Make sure to use equipment that fits you.  Maintain physical fitness, including: ? Strength. ? Flexibility. Contact a health care provider if:  Your pain and stiffness continue or get worse.  Your leg or hip becomes weak.  You have changes in your bowel function or bladder function. This information is not intended to replace advice given to you by your health care provider. Make sure you discuss any questions you have with your health care provider. Document Released: 01/16/2005 Document Revised: 09/21/2015 Document Reviewed: 12/29/2014 Elsevier Interactive Patient Education  2018 [employer].    Piriformis Syndrome Rehab Ask your health care provider which exercises are safe for you. Do exercises exactly as told by your health care provider and adjust them as directed. It is normal to feel mild stretching, pulling, tightness, or discomfort as you do these exercises, but you should stop right away if you feel sudden pain or your pain gets worse.Do not begin these exercises until told by your health care provider. Stretching and range of motion exercises These exercises warm up your muscles and joints and improve the movement and flexibility of your hip and pelvis. These exercises also help to relieve pain, numbness, and tingling. Exercise A: Hip rotators  1. Lie on your back on a firm surface. 2. Pull your left / right knee toward your same shoulder with your left / right hand until your knee is pointing toward the ceiling. Hold your left / right ankle with your other hand. 3. Keeping your knee  steady, gently pull your left / right ankle toward your other shoulder until you feel a stretch in your buttocks. 4. Hold this position for __________ seconds. Repeat __________ times. Complete this stretch __________ times a day. Exercise B: Hip extensors 1. Lie on your back on a firm surface. Both of your legs should be straight. 2. Pull your left / right knee to your chest. Hold your leg in this position by holding onto the back of your thigh or the front of your knee. 3. Hold this position for __________ seconds. 4. Slowly return to the starting position. Repeat __________ times. Complete this stretch __________ times a day. Strengthening exercises These exercises build strength and endurance in your hip and thigh muscles. Endurance is the ability to use your muscles for a long time, even after they get tired. Exercise C: Straight leg raises ( hip abductors) 1. Lie on your side with your left / right leg in the top position. Shanda Howells  so your head, shoulder, knee, and hip line up. Bend your bottom knee to help you balance. 2. Lift your top leg up 4-6 inches (10-15 cm), keeping your toes pointed straight ahead. 3. Hold this position for __________ seconds. 4. Slowly lower your leg to the starting position. Let your muscles relax completely. Repeat __________ times. Complete this exercise__________ times a day. Exercise D: Hip abductors and rotators, quadruped  1. Get on your hands and knees on a firm, lightly padded surface. Your hands should be directly below your shoulders, and your knees should be directly below your hips. 2. Lift your left / right knee out to the side. Keep your knee bent. Do not twist your body. 3. Hold this position for __________ seconds. 4. Slowly lower your leg. Repeat __________ times. Complete this exercise__________ times a day. Exercise E: Straight leg raises ( hip extensors) 1. Lie on your abdomen on a bed or a firm surface with a pillow under your  hips. 2. Squeeze your buttock muscles and lift your left / right thigh off the bed. Do not let your back arch. 3. Hold this position for __________ seconds. 4. Slowly return to the starting position. Let your muscles relax completely before doing another repetition. Repeat __________ times. Complete this exercise__________ times a day. This information is not intended to replace advice given to you by your health care provider. Make sure you discuss any questions you have with your health care provider. Document Released: 01/16/2005 Document Revised: 09/21/2015 Document Reviewed: 12/29/2014 Elsevier Interactive Patient Education  [employer].

## 2017-10-04 ENCOUNTER — Encounter: Payer: Self-pay | Admitting: Family Medicine

## 2017-10-04 ENCOUNTER — Ambulatory Visit: Payer: BC Managed Care – PPO | Admitting: Rehabilitative and Restorative Service Providers"

## 2017-10-08 ENCOUNTER — Ambulatory Visit (INDEPENDENT_AMBULATORY_CARE_PROVIDER_SITE_OTHER): Payer: BC Managed Care – PPO | Admitting: Rehabilitative and Restorative Service Providers"

## 2017-10-08 ENCOUNTER — Encounter: Payer: Self-pay | Admitting: Rehabilitative and Restorative Service Providers"

## 2017-10-08 DIAGNOSIS — R29898 Other symptoms and signs involving the musculoskeletal system: Secondary | ICD-10-CM | POA: Diagnosis not present

## 2017-10-08 DIAGNOSIS — M25551 Pain in right hip: Secondary | ICD-10-CM

## 2017-10-08 NOTE — Patient Instructions (Signed)
Piriformis Stretch   Lying on back, pull right knee toward opposite shoulder. Hold 30 seconds. Repeat 3 times. Do 2-3 sessions per day.  Quads / HF, Supine   Lie near edge of bed, pull both knees up toward chest. Hold one knee as you drop the other leg off the edge of the bed.  Relax hanging knee/can bend knee back if indicated. Hold 30 seconds. Repeat 3 times per session. Do 2-3 sessions per day.   TENS UNIT: This is helpful for muscle pain and spasm.   Search and Purchase a TENS 7000 2nd edition at www.tenspros.com. It should be less than $30.     TENS unit instructions: Do not shower or bathe with the unit on Turn the unit off before removing electrodes or batteries If the electrodes lose stickiness add a drop of water to the electrodes after they are disconnected from the unit and place on plastic sheet. If you continued to have difficulty, call the TENS unit company to purchase more electrodes. Do not apply lotion on the skin area prior to use. Make sure the skin is clean and dry as this will help prolong the life of the electrodes. After use, always check skin for unusual red areas, rash or other skin difficulties. If there are any skin problems, does not apply electrodes to the same area. Never remove the electrodes from the unit by pulling the wires. Do not use the TENS unit or electrodes other than as directed. Do not change electrode placement without consultating your therapist or physician. Keep 2 fingers with between each electrode.

## 2017-10-08 NOTE — Therapy (Signed)
[facility] [place]  [place], [place], [address] Phone: [phone]   Fax:  [phone]  Physical Therapy Evaluation  Patient Details  Name: [name] MRN: [id number] Date of Birth: [birth year] Referring Provider: Dr Lynne Leader    Encounter Date: 10/08/2017  PT End of Session - 10/08/17 1151    Visit Number  1    Number of Visits  12    Date for PT Re-Evaluation  11/20/17    PT Start Time  0800    PT Stop Time  3810    PT Time Calculation (min)  57 min       Past Medical History:  Diagnosis Date  . ADD (attention deficit disorder)   . Arthritis    "bunion on  right" (08/11/2014)  . Chronic bronchitis ([place])    "plenty; but not q yr" (08/11/2014)  . Depression   . Dyslipidemia   . Environmental allergies   . GERD (gastroesophageal reflux disease)   . Hearing difficulty of left ear    "grew up w/deaf parent; find myself lip reading more recently" (08/11/2014)  . Hepatitis    "exposed in college; rec'd gamma globulin; 6 months later S/S (weakness, lethargy, fevers); ; dx'd infectious hepatitis"  . History of herpes genitalis    "stress induced reoccurance that shows up on my left middle finger" (08/11/2014)  . History of hiatal hernia   . History of stomach ulcers   . Hypercholesterolemia   . Hyperthyroidism 1994   "postpartum only; resolved itself"  . Incisional hernia   . Internal hemorrhoids   . Leg cramping   . Osteopenia   . Seasonal asthma   . Stress incontinence, female   . Ulcer     Past Surgical History:  Procedure Laterality Date  . CESAREAN SECTION  1994  . CHALAZION EXCISION Left ~ 2013  . DILATION AND CURETTAGE OF UTERUS  X 2  . EYE SURGERY    . HERNIA REPAIR  08/11/2014  . INCISIONAL HERNIA REPAIR N/A 08/11/2014   Procedure: LAPAROSCOPIC INCISIONAL HERNIA REPAIR;  Surgeon: Coralie Keens, MD;  Location: [facility];  Service: General;  Laterality: N/A;  . INCONTINENCE SURGERY  2005  .  INGUINAL HERNIA REPAIR Left 08/11/2014  . INGUINAL HERNIA REPAIR Left 08/11/2014   Procedure: LEFT INGUINAL HERNIA REPAIR;  Surgeon: Coralie Keens, MD;  Location: [facility];  Service: General;  Laterality: Left;  . INSERTION OF MESH Left 08/11/2014   Procedure: INSERTION OF MESH TO LEFT GROIN AND ABDOMEN;  Surgeon: Coralie Keens, MD;  Location: [facility];  Service: General;  Laterality: Left;  . LAPAROSCOPIC INCISIONAL / UMBILICAL / VENTRAL HERNIA REPAIR  08/11/2014   I[facility]  . NASAL POLYP EXCISION  2014  . REPAIR OF PERFORATED ULCER  1985  . TONSILLECTOMY    . TOTAL SHOULDER ARTHROPLASTY Left 09/21/2017   Procedure: LEFT TOTAL SHOULDER ARTHROPLASTY;  Surgeon: Nicholes Stairs, MD;  Location: [facility];  Service: Orthopedics;  Laterality: Left;    There were no vitals filed for this visit.   Subjective Assessment - 10/08/17 0817    Subjective  Patient reports that she has been sleepin gon her sofa before her Lt shoulder surgery. She noticed gradual onset of Rt hip pain - stabbing pain. This has been present for ~ 2 months.  she has a history of sciatica intermittently for 10 yrs.     Pertinent History  Lt shoulder surgery for TSA 09/21/17; removal Lt Holloway toe phalanx  7/19; bunionectomy 11/16; ingunial and lap hernia repair 6/16; sling proceedure for bladder; sinus surgery; C-section; ulcer repair 1985     Diagnostic tests  xrays     Patient Stated Goals  get rid of the hip pain     Currently in Pain?  No/denies    Pain Score  --   no pain at sitting - sharp pain can be 9/10 for a few seconds up to 10 min    Pain Location  Hip    Pain Orientation  Right    Pain Descriptors / Indicators  Sharp;Stabbing    Pain Type  Acute pain    Pain Radiating Towards  posterior hip     Pain Onset  More than a month ago    Pain Frequency  Intermittent    Aggravating Factors   rolling on Rt hip; lying on the sofa; unpredictable    Pain Relieving Factors  meds for shoulder; mm relaxant and pain meds; tylenol          [facility] PT Assessment - 10/08/17 0001      Assessment   Medical Diagnosis  Rt posterior hip pain     Referring Provider  Dr Lynne Leader     Onset Date/Surgical Date  08/13/17    Hand Dominance  Right    Next MD Visit  not with Dr Georgina Snell     Prior Therapy  chiropractic care yrs ago       Precautions   Precaution Comments  Lt TSA       Balance Screen   Has the patient fallen in the past 6 months  Yes    How many times?  several falls     Has the patient had a decrease in activity level because of a fear of falling?   No    Is the patient reluctant to leave their home because of a fear of falling?   No      Prior Function   Level of Independence  Independent    Vocation  Full time employment   on medical leave    Conservator, museum/gallery in elementary school     Leisure  household chores       Observation/Other Assessments   Focus on Therapeutic Outcomes (FOTO)   40% limitation       Sensation   Additional Comments  WFL Rt LE per pt report       Posture/Postural Control   Posture/Postural Control  Postural limitations    Postural Limitations  Flexed trunk   flexed forward at hips    Posture Comments  head forward; shoulders rounded and elevated; flexed forward at hips      AROM   Right/Left Hip  --   end range tightness extension/rotation    Right/Left Knee  --   WFL's bilat      Strength   Right/Left Hip  --   WFL's bilat LE's    Right/Left Knee  --   WFL's bilat LE's      Flexibility   Piriformis  tight Rt       Palpation   Palpation comment  muscular tightness posterior Rt hip - standing - pt with Lt UE in sling unable to lie prone for palpation                 Objective measurements completed on examination: See above findings.      [facility] Adult PT Treatment/Exercise - 10/08/17 0001  Therapeutic Activites    Therapeutic Activities  --   myofacial ball release work Rt posterior hip in standing      Knee/Hip  Exercises: Stretches   Hip Flexor Stretch  Right;1 rep;30 seconds   Rt LE lying off edge of table hip ext; knee flexed    Piriformis Stretch  Right;2 reps;30 seconds   supine travell PT assist for stretch d/t Rt shoulder surgery     Moist Heat Therapy   Number Minutes Moist Heat  20 Minutes    Moist Heat Location  Hip   Rt posterior hip      Electrical Stimulation   Electrical Stimulation Location  Rt posterior hip     Electrical Stimulation Action  IFC    Electrical Stimulation Parameters  to tolerance    Electrical Stimulation Goals  Pain;Tone             PT Education - 10/08/17 0849    Education Details  HEP TENS pt to avoid crossing legs/sitting or standing with LE's in ER     Person(s) Educated  Patient    Methods  Explanation;Demonstration;Tactile cues;Verbal cues;Handout    Comprehension  Verbalized understanding;Returned demonstration;Verbal cues required;Tactile cues required          PT Long Term Goals - 10/08/17 1401      PT LONG TERM GOAL #1   Title  Decrease pain in the Rt posterior hip by 50-75% allowing patient to participate in functional activities with minimal dysfunction or limitations 11/20/17    Time  6    Period  Weeks    Status  New      PT LONG TERM GOAL #2   Title  Improve hip mobility Rt to equal Lt 11/20/17    Time  6    Period  Weeks      PT LONG TERM GOAL #3   Title  Independent in HEP 11/20/17    Time  6    Period  Weeks    Status  New      PT LONG TERM GOAL #4   Title  Improve/maintain FOTO to </= 40% limitation 11/20/17    Time  6    Period  Weeks    Status  New             Plan - 10/08/17 1354    Clinical Impression Statement  Veria presents with 2 month history of Rt posterior hip/buttocks pain which she feels may be related to sleeping on her sofa due to shoulder dysfunction and subsequent surgery. Patient has poor posture and alignment; limited mobility; tenderness to palpation; pain limiting functional  activities. Patient will benefit from PT to address problems identified.     History and Personal Factors relevant to plan of care:  chronic recurrent Rt hip pain; Lt TSA 09/21/17    Clinical Presentation  Stable    Clinical Presentation due to:  difficulty with mobility/positionsing for treatment or hip due to shoulder surgery     Clinical Decision Making  Low    Rehab Potential  Good    PT Frequency  2x / week    PT Duration  6 weeks    PT Treatment/Interventions  Patient/family education;ADLs/Self Care Home Management;Cryotherapy;Electrical Stimulation;Iontophoresis 4mg /ml Dexamethasone;Moist Heat;Ultrasound;Dry needling;Manual techniques;Neuromuscular re-education;Therapeutic activities;Therapeutic exercise    PT Next Visit Plan  review HEP; progress with stretching and strengthening; manual work and modalities as indicated     Consulted and Agree with Plan of Care  Patient  Patient will benefit from skilled therapeutic intervention in order to improve the following deficits and impairments:  Postural dysfunction, Improper body mechanics, Pain, Increased fascial restricitons, Increased muscle spasms, Decreased mobility, Decreased range of motion, Decreased activity tolerance  Visit Diagnosis: Pain in right hip - Plan: PT plan of care cert/re-cert  Other symptoms and signs involving the musculoskeletal system - Plan: PT plan of care cert/re-cert     Problem List Patient Active Problem List   Diagnosis Date Noted  . 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  . IFG (impaired fasting glucose) 08/03/2017  . Primary osteoarthritis of both knees 07/29/2017  . Osteoarthritis, localized, shoulder, left 07/29/2017  . Sinusitis, chronic   . Asthma 01/08/2012  . Dyslipidemia   . ADD (attention deficit disorder)   . Depression   . Osteopenia   . Incisional hernia   . Stress incontinence, female     Everardo All PT, MPH  10/08/2017, 5:19 PM  [facility] [place], [place], [address] Phone: [phone]   Fax:  [phone]  Name: [name] MRN: [id number] Date of Birth: [birth year]

## 2017-10-11 ENCOUNTER — Ambulatory Visit (INDEPENDENT_AMBULATORY_CARE_PROVIDER_SITE_OTHER): Payer: BC Managed Care – PPO | Admitting: Physical Therapy

## 2017-10-11 ENCOUNTER — Encounter: Payer: Self-pay | Admitting: Physical Therapy

## 2017-10-11 DIAGNOSIS — R29898 Other symptoms and signs involving the musculoskeletal system: Secondary | ICD-10-CM

## 2017-10-11 DIAGNOSIS — M25551 Pain in right hip: Secondary | ICD-10-CM

## 2017-10-11 NOTE — Therapy (Signed)
[place], [place], [address] Phone: [phone]   Fax:  [phone]  Physical Therapy Treatment  Patient Details  Name: [name] MRN: [id number] Date of Birth: [birth year] Referring Provider: Dr. Lynne Leader    Encounter Date: 10/11/2017  PT End of Session - 10/11/17 1540    Visit Number  2    Number of Visits  12    Date for PT Re-Evaluation  11/20/17    PT Start Time  1536    PT Stop Time  1636    PT Time Calculation (min)  60 min    Activity Tolerance  Patient tolerated treatment well    Behavior During Therapy  [name] for tasks assessed/performed       Past Medical History:  Diagnosis Date  . ADD (attention deficit disorder)   . Arthritis    "bunion on  right" (08/11/2014)  . Chronic bronchitis ([place])    "plenty; but not q yr" (08/11/2014)  . Depression   . Dyslipidemia   . Environmental allergies   . GERD (gastroesophageal reflux disease)   . Hearing difficulty of left ear    "grew up w/deaf parent; find myself lip reading more recently" (08/11/2014)  . Hepatitis    "exposed in college; rec'd gamma globulin; 6 months later S/S (weakness, lethargy, fevers); ; dx'd infectious hepatitis"  . History of herpes genitalis    "stress induced reoccurance that shows up on my left middle finger" (08/11/2014)  . History of hiatal hernia   . History of stomach ulcers   . Hypercholesterolemia   . Hyperthyroidism 1994   "postpartum only; resolved itself"  . Incisional hernia   . Internal hemorrhoids   . Leg cramping   . Osteopenia   . Seasonal asthma   . Stress incontinence, female   . Ulcer     Past Surgical History:  Procedure Laterality Date  . CESAREAN SECTION  1994  . CHALAZION EXCISION Left ~ 2013  . DILATION AND CURETTAGE OF UTERUS  X 2  . EYE SURGERY    . HERNIA REPAIR  08/11/2014  . INCISIONAL HERNIA REPAIR N/A 08/11/2014   Procedure: LAPAROSCOPIC INCISIONAL HERNIA REPAIR;   Surgeon: Coralie Keens, MD;  Location: [facility];  Service: General;  Laterality: N/A;  . INCONTINENCE SURGERY  2005  . INGUINAL HERNIA REPAIR Left 08/11/2014  . INGUINAL HERNIA REPAIR Left 08/11/2014   Procedure: LEFT INGUINAL HERNIA REPAIR;  Surgeon: Coralie Keens, MD;  Location: [facility];  Service: General;  Laterality: Left;  . INSERTION OF MESH Left 08/11/2014   Procedure: INSERTION OF MESH TO LEFT GROIN AND ABDOMEN;  Surgeon: Coralie Keens, MD;  Location: [facility];  Service: General;  Laterality: Left;  . LAPAROSCOPIC INCISIONAL / UMBILICAL / VENTRAL HERNIA REPAIR  08/11/2014   IHR  . NASAL POLYP EXCISION  2014  . REPAIR OF PERFORATED ULCER  1985  . TONSILLECTOMY    . TOTAL SHOULDER ARTHROPLASTY Left 09/21/2017   Procedure: LEFT TOTAL SHOULDER ARTHROPLASTY;  Surgeon: Nicholes Stairs, MD;  Location: [facility];  Service: Orthopedics;  Laterality: Left;    There were no vitals filed for this visit.  Subjective Assessment - 10/11/17 1541    Subjective  Pt arrives with her daughter, present in case she is shown exercises she may need assistance with.  Pt reports no new changes in Rt hip pain. She will be evaluated for s/p shoulder surgery in future visit.     Pertinent  History  Lt shoulder surgery for TSA 09/21/17; removal Lt little toe phalanx 7/19; bunionectomy 11/16; ingunial and lap hernia repair 6/16; sling proceedure for bladder; sinus surgery; C-section; ulcer repair 1985     Patient Stated Goals  get rid of the hip pain     Currently in Pain?  Yes    Pain Score  2     Pain Location  Hip    Pain Orientation  Right    Pain Descriptors / Indicators  Sharp    Aggravating Factors   rolling on Rt hip    Pain Relieving Factors  medication         [facility] PT Assessment - 10/11/17 0001      Assessment   Medical Diagnosis  Rt posterior hip pain     Referring Provider  Dr. Lynne Leader     Onset Date/Surgical Date  08/13/17    Hand Dominance  Right        [facility] Adult PT  Treatment/Exercise - 10/11/17 0001      Self-Care   Self-Care  Other Self-Care Comments    Other Self-Care Comments   pt re-educated on self massage with ball and theracane to Rt hip.  Pt returned demo with cues.        Exercises   Exercises  Lumbar;Knee/Hip      Lumbar Exercises: Stretches   Hip Flexor Stretch Limitations  reviewed supine version via demo    Piriformis Stretch Limitations  reviewed supine version via demo      Lumbar Exercises: Seated   Other Seated Lumbar Exercises  TA engagement x 5 sec x 10 reps, repeated cues for technique and to breathe       Knee/Hip Exercises: Stretches   Passive Hamstring Stretch  Right;Left;2 reps;30 seconds   seated   Hip Flexor Stretch  Right;Left;2 reps;30 seconds   seated    Piriformis Stretch  Right;Left;2 reps;30 seconds   seated, leaning forward     Moist Heat Therapy   Number Minutes Moist Heat  20 Minutes    Moist Heat Location  Hip   Rt posterior hip      Electrical Stimulation   Electrical Stimulation Location  Rt posterior hip     Electrical Stimulation Action  IFC    Electrical Stimulation Parameters  to tolerance    Electrical Stimulation Goals  Pain             PT Education - 10/11/17 1626    Education Details  HEP, self massage     Person(s) Educated  Patient    Methods  Explanation;Demonstration    Comprehension  Verbalized understanding;Returned demonstration          PT Long Term Goals - 10/08/17 1401      PT LONG TERM GOAL #1   Title  Decrease pain in the Rt posterior hip by 50-75% allowing patient to participate in functional activities with minimal dysfunction or limitations 11/20/17    Time  6    Period  Weeks    Status  New      PT LONG TERM GOAL #2   Title  Improve hip mobility Rt to equal Lt 11/20/17    Time  6    Period  Weeks      PT LONG TERM GOAL #3   Title  Independent in HEP 11/20/17    Time  6    Period  Weeks    Status  New  PT LONG TERM GOAL #4   Title   Improve/maintain FOTO to </= 40% limitation 11/20/17    Time  6    Period  Weeks    Status  New            Plan - 10/11/17 1756    Clinical Impression Statement  Pt shown alterative exercises to stretch her RLE as well as some core activation. Modifications made due to her Rt arm being in sling s/p shoulder surgery.  She requires some cues to stay on task, for proper form, and to breathe during exercise.     Rehab Potential  Good    PT Frequency  2x / week    PT Duration  6 weeks    PT Treatment/Interventions  Patient/family education;ADLs/Self Care Home Management;Cryotherapy;Electrical Stimulation;Iontophoresis 4mg /ml Dexamethasone;Moist Heat;Ultrasound;Dry needling;Manual techniques;Neuromuscular re-education;Therapeutic activities;Therapeutic exercise    PT Next Visit Plan  progress with stretching and strengthening, including core; manual work and modalities as indicated     Consulted and Agree with Plan of Care  Patient;Family member/caregiver    Family Member Consulted  pt's daughter.        Patient will benefit from skilled therapeutic intervention in order to improve the following deficits and impairments:  Postural dysfunction, Improper body mechanics, Pain, Increased fascial restricitons, Increased muscle spasms, Decreased mobility, Decreased range of motion, Decreased activity tolerance  Visit Diagnosis: Pain in right hip  Other symptoms and signs involving the musculoskeletal system     Problem List Patient Active Problem List   Diagnosis Date Noted  . 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  . IFG (impaired fasting glucose) 08/03/2017  . Primary osteoarthritis of both knees 07/29/2017  . Osteoarthritis, localized, shoulder, left 07/29/2017  . Sinusitis, chronic   . Asthma 01/08/2012  . Dyslipidemia   . ADD (attention deficit disorder)   . Depression   . Osteopenia   . Incisional hernia   . Stress incontinence, female    [name],  [place] 10/11/17 [place], [place], [address] Phone: [phone]   Fax:  [phone]  Name: [name] MRN: [id number] Date of Birth: [birth year]

## 2017-10-11 NOTE — Patient Instructions (Signed)
  Abdominal Bracing With Pelvic Floor (Hook-Lying)   With neutral spine, tighten pelvic floor and abdominals. Hold 10 seconds. Repeat __10_ times. Do _several__ times a day. Can be performed in sitting, standing.    Piriformis Stretch, Sitting PNF    Sit, one ankle on opposite knee, same-side hand on crossed knee.  Lean torso forward until tension is felt in hamstrings and gluteals of crossed-leg side.  Hold for 15-30 seconds. 2-3 reps    [facility] [place], [place] [address]  [phone] (office) [phone] (fax)

## 2017-10-12 ENCOUNTER — Encounter: Payer: BC Managed Care – PPO | Admitting: Physical Therapy

## 2017-10-15 ENCOUNTER — Encounter: Payer: Self-pay | Admitting: Rehabilitative and Restorative Service Providers"

## 2017-10-15 ENCOUNTER — Ambulatory Visit (INDEPENDENT_AMBULATORY_CARE_PROVIDER_SITE_OTHER): Payer: BC Managed Care – PPO | Admitting: Rehabilitative and Restorative Service Providers"

## 2017-10-15 DIAGNOSIS — R29898 Other symptoms and signs involving the musculoskeletal system: Secondary | ICD-10-CM | POA: Diagnosis not present

## 2017-10-15 DIAGNOSIS — M6281 Muscle weakness (generalized): Secondary | ICD-10-CM | POA: Diagnosis not present

## 2017-10-15 DIAGNOSIS — M25512 Pain in left shoulder: Secondary | ICD-10-CM

## 2017-10-15 DIAGNOSIS — G8929 Other chronic pain: Secondary | ICD-10-CM

## 2017-10-15 DIAGNOSIS — R600 Localized edema: Secondary | ICD-10-CM

## 2017-10-15 DIAGNOSIS — M25551 Pain in right hip: Secondary | ICD-10-CM

## 2017-10-15 NOTE — Therapy (Signed)
[facility]  [place], [place], [address] Phone: [phone]   Fax:  [phone]  Physical Therapy Evaluation  Patient Details  Name: [name] MRN: [id number] Date of Birth: 16-Aug-1950 Referring Provider: Dr Lynne Leader - hip; Dr Victorino December - shoulder    Encounter Date: 10/15/2017  PT End of Session - 10/15/17 1658    Visit Number  3    Number of Visits  24    Date for PT Re-Evaluation  11/23/17    PT Start Time  6967    PT Stop Time  1509    PT Time Calculation (min)  64 min    Activity Tolerance  Patient tolerated treatment well       Past Medical History:  Diagnosis Date  . ADD (attention deficit disorder)   . Arthritis    "bunion on  right" (08/11/2014)  . Chronic bronchitis ([place])    "plenty; but not q yr" (08/11/2014)  . Depression   . Dyslipidemia   . Environmental allergies   . GERD (gastroesophageal reflux disease)   . Hearing difficulty of left ear    "grew up w/deaf parent; find myself lip reading more recently" (08/11/2014)  . Hepatitis    "exposed in college; rec'd gamma globulin; 6 months later S/S (weakness, lethargy, fevers); ; dx'd infectious hepatitis"  . History of herpes genitalis    "stress induced reoccurance that shows up on my left middle finger" (08/11/2014)  . History of hiatal hernia   . History of stomach ulcers   . Hypercholesterolemia   . Hyperthyroidism 1994   "postpartum only; resolved itself"  . Incisional hernia   . Internal hemorrhoids   . Leg cramping   . Osteopenia   . Seasonal asthma   . Stress incontinence, female   . Ulcer     Past Surgical History:  Procedure Laterality Date  . CESAREAN SECTION  1994  . CHALAZION EXCISION Left ~ 2013  . DILATION AND CURETTAGE OF UTERUS  X 2  . EYE SURGERY    . HERNIA REPAIR  08/11/2014  . INCISIONAL HERNIA REPAIR N/A 08/11/2014   Procedure: LAPAROSCOPIC INCISIONAL HERNIA REPAIR;  Surgeon: Coralie Keens, MD;   Location: [facility];  Service: General;  Laterality: N/A;  . INCONTINENCE SURGERY  2005  . INGUINAL HERNIA REPAIR Left 08/11/2014  . INGUINAL HERNIA REPAIR Left 08/11/2014   Procedure: LEFT INGUINAL HERNIA REPAIR;  Surgeon: Coralie Keens, MD;  Location: [facility];  Service: General;  Laterality: Left;  . INSERTION OF MESH Left 08/11/2014   Procedure: INSERTION OF MESH TO LEFT GROIN AND ABDOMEN;  Surgeon: Coralie Keens, MD;  Location: [facility];  Service: General;  Laterality: Left;  . LAPAROSCOPIC INCISIONAL / UMBILICAL / VENTRAL HERNIA REPAIR  08/11/2014   IHR  . NASAL POLYP EXCISION  2014  . REPAIR OF PERFORATED ULCER  1985  . TONSILLECTOMY    . TOTAL SHOULDER ARTHROPLASTY Left 09/21/2017   Procedure: LEFT TOTAL SHOULDER ARTHROPLASTY;  Surgeon: Nicholes Stairs, MD;  Location: [facility];  Service: Orthopedics;  Laterality: Left;    There were no vitals filed for this visit.   Subjective Assessment - 10/15/17 1407    Subjective  Patient reports that she has had Lt shoulder pain in the upper trap and neck area ~ 2 years ago. Symptoms persisted and increased. She noted increased symptoms for the past 12-18 months. She was seen by MD and underwent diagnostic testing. Patient underwent ortho eval  and subsequent TSA 09/21/17. Patient reports that she experienced pain in the Lt knee and leg following the exercises for the hip.     Pertinent History  Lt shoulder surgery for TSA 09/21/17; removal Lt little toe phalanx 7/19; bunionectomy 11/16; ingunial and lap hernia repair 6/16; sling proceedure for bladder; sinus surgery; C-section; ulcer repair 1985     Diagnostic tests  xrays MRI CT scan     Patient Stated Goals  get rid of the hip pain; get shoulder working again     Currently in Pain?  Yes    Pain Score  4     Pain Location  Hip    Pain Orientation  Right    Pain Descriptors / Indicators  Aching;Discomfort    Pain Type  Acute pain    Pain Onset  More than a month ago    Pain Frequency  Intermittent     Pain Score  0    Pain Location  Shoulder    Pain Orientation  Right    Pain Descriptors / Indicators  Aching    Pain Type  Chronic pain    Pain Radiating Towards  posterior - scapula area on Rt "a little achy"     Pain Onset  More than a month ago    Pain Frequency  Intermittent    Aggravating Factors   unknown    Pain Relieving Factors  meds; rest          [facility] PT Assessment - 10/15/17 0001      Assessment   Medical Diagnosis  Rt posterior hip pain     Referring Provider  Dr Lynne Leader - hip; Dr Victorino December - shoulder     Onset Date/Surgical Date  08/13/17   Lt shoulder surgery 09/21/17   Hand Dominance  Right    Next MD Visit  not with Dr Georgina Snell; 10/19    Prior Therapy  chiropractic care yrs ago       Precautions   Precaution Comments  Lt TSA       Balance Screen   Has the patient fallen in the past 6 months  Yes    How many times?  several falls     Has the patient had a decrease in activity level because of a fear of falling?   No    Is the patient reluctant to leave their home because of a fear of falling?   No      Prior Function   Level of Independence  Independent    Vocation  Full time employment    Conservator, museum/gallery in [place]  household chores       Sensation   Additional Comments  WFL Rt LE/Lt UE  per pt report       Posture/Postural Control   Posture/Postural Control  Postural limitations    Posture Comments  head forward; shoulders rounded and elevated; flexed forward at hips      AROM   Right/Left Shoulder  --   no pain with Rt shd ROM- Lt not tested due to surgery    Right Shoulder Extension  60 Degrees    Right Shoulder Flexion  153 Degrees    Right Shoulder ABduction  161 Degrees    Right Shoulder Internal Rotation  28 Degrees    Right Shoulder External Rotation  90 Degrees      PROM   Right/Left Shoulder  --  Lt shoulder - pt sitting    Left Shoulder Flexion  108 Degrees    Left Shoulder  ABduction  92 Degrees    Left Shoulder External Rotation  10 Degrees   elbow in neutral at side      Strength   Right/Left Shoulder  --   some crepitus w/strength testing Rt; not tested on Lt    Right Shoulder Flexion  5/5    Right Shoulder Extension  5/5    Right Shoulder ABduction  5/5    Right Shoulder Internal Rotation  5/5    Right Shoulder External Rotation  5/5      Palpation   Palpation comment  muscular tightness Lt ant/postlateral shoulder girdle inc cervical musculature; upper trap; leveator; teres; pecs -  posterior Rt hip - standing - pt with Lt UE in sling unable to lie prone for palpation                 Objective measurements completed on examination: See above findings.      [facility] Adult PT Treatment/Exercise - 10/15/17 0001      Shoulder Exercises: Standing   Other Standing Exercises  scap squeeze 10 sec x 10; axial extension 10 sec x 5 w/ swim noodle     Other Standing Exercises  pendulum 30 CW/30 CCW       Shoulder Exercises: Stretch   Table Stretch - Flexion  5 reps;10 seconds   table at Lt side      Moist Heat Therapy   Number Minutes Moist Heat  15 Minutes    Moist Heat Location  Hip   Rt posterior hip      Electrical Stimulation   Electrical Stimulation Location  Lt shoulder girdle     Electrical Stimulation Action  IFC    Electrical Stimulation Parameters  to tolerance    Electrical Stimulation Goals  Pain;Tone      Vasopneumatic   Number Minutes Vasopneumatic   15 minutes    Vasopnuematic Location   Shoulder   Lt    Vasopneumatic Pressure  Low    Vasopneumatic Temperature   34 deg       Manual Therapy   Manual therapy comments  pt sitting     Soft tissue mobilization  soft tissue work through Lt shoulder girdle to pt tolerance     Passive ROM  gentle PROM Lt shoulder in flexin, scaption and ERto ~ 10 deg              PT Education - 10/15/17 1447    Education Details  HEP     Person(s) Educated  Patient    Methods   Explanation;Demonstration;Tactile cues;Verbal cues;Handout    Comprehension  Verbalized understanding;Returned demonstration;Verbal cues required;Tactile cues required          PT Long Term Goals - 10/15/17 1702      PT LONG TERM GOAL #1   Title  Decrease pain in the Rt posterior hip by 50-75% allowing patient to participate in functional activities with minimal dysfunction or limitations 11/20/17    Time  6    Period  Weeks    Status  On-going      PT LONG TERM GOAL #2   Title  Improve hip mobility Rt to equal Lt 11/20/17    Time  6    Period  Weeks    Status  On-going      PT LONG TERM GOAL #3   Title  Independent in  HEP 11/20/17    Time  6    Period  Weeks    Status  On-going      PT LONG TERM GOAL #4   Title  Improve/maintain FOTO to </= 40% limitation 11/20/17    Time  6    Period  Weeks    Status  On-going      PT LONG TERM GOAL #5   Title  improve Lt shoulder ROM to WFL's and within 5-10 deg of AROM Rt shoulder 11/26/17    Time  6    Period  Weeks    Status  New      PT LONG TERM GOAL #6   Title  Increase strength Lt UE to 4/5 to 4+/5  throughout 11/26/17    Time  6    Period  Weeks    Status  New      PT LONG TERM GOAL #7   Title  Patient to return to normal functional activities using Lt UE 11/26/17    Time  [place] - 10/15/17 1700    Clinical Impression Statement  [name] presents s/p Lt TSA 09/21/17. She presents with poor posture and alignment; limited mobility and strength Rt UE; muscular tightness Rt upper quarter; pain Lt shoulder; decreased functional activity level. patient will benefit from PT to address problems as identified.     Rehab Potential  Good    PT Frequency  2x / week    PT Duration  6 weeks    PT Treatment/Interventions  Patient/family education;ADLs/Self Care Home Management;Cryotherapy;Electrical Stimulation;Iontophoresis 4mg /ml Dexamethasone;Moist Heat;Ultrasound;Dry  needling;Manual techniques;Neuromuscular re-education;Therapeutic activities;Therapeutic exercise    PT Next Visit Plan  progress with stretching and strengthening, including core; manual work and modalities as indicated     Consulted and Agree with Plan of Care  Patient;Family member/caregiver       Patient will benefit from skilled therapeutic intervention in order to improve the following deficits and impairments:  Postural dysfunction, Improper body mechanics, Pain, Increased fascial restricitons, Increased muscle spasms, Decreased mobility, Decreased range of motion, Decreased activity tolerance  Visit Diagnosis: Pain in right hip  Other symptoms and signs involving the musculoskeletal system - Plan: PT plan of care cert/re-cert  Chronic left shoulder pain - Plan: PT plan of care cert/re-cert  Muscle weakness (generalized) - Plan: PT plan of care cert/re-cert  Localized edema - Plan: PT plan of care cert/re-cert     Problem List Patient Active Problem List   Diagnosis Date Noted  . 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  . IFG (impaired fasting glucose) 08/03/2017  . Primary osteoarthritis of both knees 07/29/2017  . Osteoarthritis, localized, shoulder, left 07/29/2017  . Sinusitis, chronic   . Asthma 01/08/2012  . Dyslipidemia   . ADD (attention deficit disorder)   . Depression   . Osteopenia   . Incisional hernia   . Stress incontinence, female     Everardo All PT, MPH  10/15/2017, 5:11 PM  [facility] [place], [place], [address] Phone: [phone]   Fax:  [phone]  Name: [name] MRN: [id number] Date of Birth: [birth year]

## 2017-10-15 NOTE — Patient Instructions (Addendum)
Pendulum Circular    Bend forward 90 at waist, leaning on table for support. Rock body in a circular pattern to move arm clockwise __20-30__ times then counterclockwise __20-30__ times. Do ___2-3_ sessions per day.   External Rotator Cuff Stretch, Sitting (Passive)    Sit with elbow bent, forearm on table, palm down. Bend forward at waist until a stretch is felt in shoulder. Hold __10_ seconds.  Repeat __5-10_ times per session. Do __2-3_ sessions per day.  Axial Extension (Chin Tuck)    Pull chin in and lengthen back of neck. Hold __5__ seconds while counting out loud. Repeat __10__ times. Do __several__ sessions per day.  Shoulder Blade Squeeze    Rotate shoulders back, then squeeze shoulder blades down and back. Hold 10 sec Repeat __10__ times. Do __several __ sessions per day.    [facility] [place], [place] [address]  [phone] (office) [phone] (fax)

## 2017-10-17 ENCOUNTER — Ambulatory Visit (INDEPENDENT_AMBULATORY_CARE_PROVIDER_SITE_OTHER): Payer: BC Managed Care – PPO | Admitting: Rehabilitative and Restorative Service Providers"

## 2017-10-17 ENCOUNTER — Encounter: Payer: Self-pay | Admitting: Rehabilitative and Restorative Service Providers"

## 2017-10-17 DIAGNOSIS — M6281 Muscle weakness (generalized): Secondary | ICD-10-CM

## 2017-10-17 DIAGNOSIS — G8929 Other chronic pain: Secondary | ICD-10-CM

## 2017-10-17 DIAGNOSIS — R600 Localized edema: Secondary | ICD-10-CM

## 2017-10-17 DIAGNOSIS — R29898 Other symptoms and signs involving the musculoskeletal system: Secondary | ICD-10-CM

## 2017-10-17 DIAGNOSIS — M25512 Pain in left shoulder: Secondary | ICD-10-CM

## 2017-10-17 DIAGNOSIS — M25551 Pain in right hip: Secondary | ICD-10-CM | POA: Diagnosis not present

## 2017-10-17 NOTE — Therapy (Signed)
[place], [place], [address] Phone: [phone]   Fax:  [phone]  Physical Therapy Treatment  Patient Details  Name: [name] MRN: [id number] Date of Birth: [birth year] Referring Provider: Dr Lynne Leader - hip; Dr Victorino December - shoulder    Encounter Date: 10/17/2017  PT End of Session - 10/17/17 1114    Visit Number  4    Number of Visits  24    Date for PT Re-Evaluation  11/23/17    PT Start Time  1108    PT Stop Time  1210    PT Time Calculation (min)  62 min    Activity Tolerance  Patient tolerated treatment well       Past Medical History:  Diagnosis Date  . ADD (attention deficit disorder)   . Arthritis    "bunion on  right" (08/11/2014)  . Chronic bronchitis ([place])    "plenty; but not q yr" (08/11/2014)  . Depression   . Dyslipidemia   . Environmental allergies   . GERD (gastroesophageal reflux disease)   . Hearing difficulty of left ear    "grew up w/deaf parent; find myself lip reading more recently" (08/11/2014)  . Hepatitis    "exposed in college; rec'd gamma globulin; 6 months later S/S (weakness, lethargy, fevers); ; dx'd infectious hepatitis"  . History of herpes genitalis    "stress induced reoccurance that shows up on my left middle finger" (08/11/2014)  . History of hiatal hernia   . History of stomach ulcers   . Hypercholesterolemia   . Hyperthyroidism 1994   "postpartum only; resolved itself"  . Incisional hernia   . Internal hemorrhoids   . Leg cramping   . Osteopenia   . Seasonal asthma   . Stress incontinence, female   . Ulcer     Past Surgical History:  Procedure Laterality Date  . CESAREAN SECTION  1994  . CHALAZION EXCISION Left ~ 2013  . DILATION AND CURETTAGE OF UTERUS  X 2  . EYE SURGERY    . HERNIA REPAIR  08/11/2014  . INCISIONAL HERNIA REPAIR N/A 08/11/2014   Procedure: LAPAROSCOPIC INCISIONAL HERNIA REPAIR;  Surgeon: Coralie Keens, MD;   Location: [facility];  Service: General;  Laterality: N/A;  . INCONTINENCE SURGERY  2005  . INGUINAL HERNIA REPAIR Left 08/11/2014  . INGUINAL HERNIA REPAIR Left 08/11/2014   Procedure: LEFT INGUINAL HERNIA REPAIR;  Surgeon: Coralie Keens, MD;  Location: [facility];  Service: General;  Laterality: Left;  . INSERTION OF MESH Left 08/11/2014   Procedure: INSERTION OF MESH TO LEFT GROIN AND ABDOMEN;  Surgeon: Coralie Keens, MD;  Location: [facility];  Service: General;  Laterality: Left;  . LAPAROSCOPIC INCISIONAL / UMBILICAL / VENTRAL HERNIA REPAIR  08/11/2014   IHR  . NASAL POLYP EXCISION  2014  . REPAIR OF PERFORATED ULCER  1985  . TONSILLECTOMY    . TOTAL SHOULDER ARTHROPLASTY Left 09/21/2017   Procedure: LEFT TOTAL SHOULDER ARTHROPLASTY;  Surgeon: Nicholes Stairs, MD;  Location: [facility];  Service: Orthopedics;  Laterality: Left;    There were no vitals filed for this visit.  Subjective Assessment - 10/17/17 1115    Subjective  Doing shoulder exercises at home. Shoulder is feeling ok. She has difficulty keeping the sling in place at night and during the day. She is having pain in the Lt medial knee today. Patient has taken tylenol for pain and mm relaxant but no stronger pain medication.  Patient reports that the Rt hip is feeling consistently better. She is now sleeping in the bed again and trying to get in and out of the bed the right way.     Currently in Pain?  No/denies    Pain Location  Hip    Pain Score  0    Pain Location  Shoulder                       [facility] Adult PT Treatment/Exercise - 10/17/17 0001      Lumbar Exercises: Aerobic   Nustep  L5 x 7 min LE only       Shoulder Exercises: Standing   Other Standing Exercises  scap squeeze 10 sec x 10; axial extension 10 sec x 5 w/ swim noodle     Other Standing Exercises  pendulum 30 CW/30 CCW       Shoulder Exercises: Stretch   Table Stretch - Flexion  5 reps;10 seconds   table at Lt side      Proofreader Stimulation Location  Lt shoulder girdle     Electrical Stimulation Action  IFC    Electrical Stimulation Parameters  to tolerance     Electrical Stimulation Goals  Pain;Tone      Vasopneumatic   Number Minutes Vasopneumatic   15 minutes    Vasopnuematic Location   Shoulder   Lt    Vasopneumatic Pressure  Low    Vasopneumatic Temperature   34 deg       Manual Therapy   Manual therapy comments  pt sitting     Soft tissue mobilization  soft tissue work through [employer] shoulder girdle to pt tolerance     Passive ROM  gentle PROM Lt shoulder in flexin, scaption and ERto ~ 10 deg                   PT Long Term Goals - 10/15/17 1702      PT LONG TERM GOAL #1   Title  Decrease pain in the Rt posterior hip by 50-75% allowing patient to participate in functional activities with minimal dysfunction or limitations 11/20/17    Time  6    Period  Weeks    Status  On-going      PT LONG TERM GOAL #2   Title  Improve hip mobility Rt to equal Lt 11/20/17    Time  6    Period  Weeks    Status  On-going      PT LONG TERM GOAL #3   Title  Independent in HEP 11/20/17    Time  6    Period  Weeks    Status  On-going      PT LONG TERM GOAL #4   Title  Improve/maintain FOTO to </= 40% limitation 11/20/17    Time  6    Period  Weeks    Status  On-going      PT LONG TERM GOAL #5   Title  improve Lt shoulder ROM to WFL's and within 5-10 deg of AROM Rt shoulder 11/26/17    Time  6    Period  Weeks    Status  New      PT LONG TERM GOAL #6   Title  Increase strength Lt UE to 4/5 to 4+/5  throughout 11/26/17    Time  6    Period  Weeks    Status  New  PT LONG TERM GOAL #7   Title  Patient to return to normal functional activities using Lt UE 11/26/17    Time  6    Period  Weeks    Status  New            Plan - 10/17/17 1114    Clinical Impression Statement  Progressing well with intermittent pain in the Rt posterior hip/buttocks and no pain in the Lt  shoulder. Patient is now sleeping in the bed and working on proper body mechanics for transitional movements and postures. She is avoiding crossing her legs in sitting as well. Patient deonstrated exercises for shoulder with verbal cueing from PT. PROM is within the scope of that outlined in MD protocol. Patient is progressing well with shoulder and LE rehab.     Rehab Potential  Good    PT Frequency  2x / week    PT Duration  6 weeks    PT Treatment/Interventions  Patient/family education;ADLs/Self Care Home Management;Cryotherapy;Electrical Stimulation;Iontophoresis 4mg /ml Dexamethasone;Moist Heat;Ultrasound;Dry needling;Manual techniques;Neuromuscular re-education;Therapeutic activities;Therapeutic exercise    PT Next Visit Plan  progress with stretching and strengthening, including core; manual work and modalities as indicated     Consulted and Agree with Plan of Care  Patient;Family member/caregiver       Patient will benefit from skilled therapeutic intervention in order to improve the following deficits and impairments:  Postural dysfunction, Improper body mechanics, Pain, Increased fascial restricitons, Increased muscle spasms, Decreased mobility, Decreased range of motion, Decreased activity tolerance  Visit Diagnosis: Pain in right hip  Other symptoms and signs involving the musculoskeletal system  Chronic left shoulder pain  Muscle weakness (generalized)  Localized edema     Problem List Patient Active Problem List   Diagnosis Date Noted  . 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  . IFG (impaired fasting glucose) 08/03/2017  . Primary osteoarthritis of both knees 07/29/2017  . Osteoarthritis, localized, shoulder, left 07/29/2017  . Sinusitis, chronic   . Asthma 01/08/2012  . Dyslipidemia   . ADD (attention deficit disorder)   . Depression   . Osteopenia   . Incisional hernia   . Stress incontinence, female     Everardo All PT, MPH  10/17/2017, 12:23  PM  [facility] [place], [place], [address] Phone: [phone]   Fax:  [phone]  Name: [name] MRN: [id number] Date of Birth: 1950-11-16

## 2017-10-18 ENCOUNTER — Ambulatory Visit (INDEPENDENT_AMBULATORY_CARE_PROVIDER_SITE_OTHER): Payer: BC Managed Care – PPO | Admitting: Physical Therapy

## 2017-10-18 ENCOUNTER — Encounter: Payer: Self-pay | Admitting: Physical Therapy

## 2017-10-18 DIAGNOSIS — M25551 Pain in right hip: Secondary | ICD-10-CM | POA: Diagnosis not present

## 2017-10-18 DIAGNOSIS — M6281 Muscle weakness (generalized): Secondary | ICD-10-CM

## 2017-10-18 DIAGNOSIS — G8929 Other chronic pain: Secondary | ICD-10-CM

## 2017-10-18 DIAGNOSIS — R29898 Other symptoms and signs involving the musculoskeletal system: Secondary | ICD-10-CM

## 2017-10-18 DIAGNOSIS — R600 Localized edema: Secondary | ICD-10-CM

## 2017-10-18 DIAGNOSIS — M25512 Pain in left shoulder: Secondary | ICD-10-CM | POA: Diagnosis not present

## 2017-10-18 NOTE — Therapy (Signed)
[place], [place], [address] Phone: [phone]   Fax:  [phone]  Physical Therapy Treatment  Patient Details  Name: [name] MRN: [id number] Date of Birth: October 25, 1950 Referring Provider: Dr Lynne Leader - hip; Dr Victorino December - shoulder    Encounter Date: 10/18/2017  PT End of Session - 10/18/17 1445    Visit Number  5    Number of Visits  24    Date for PT Re-Evaluation  11/23/17    PT Start Time  1638    PT Stop Time  1456    PT Time Calculation (min)  53 min    Activity Tolerance  Patient tolerated treatment well    Behavior During Therapy  [facility] for tasks assessed/performed       Past Medical History:  Diagnosis Date  . ADD (attention deficit disorder)   . Arthritis    "bunion on  right" (08/11/2014)  . Chronic bronchitis ([place])    "plenty; but not q yr" (08/11/2014)  . Depression   . Dyslipidemia   . Environmental allergies   . GERD (gastroesophageal reflux disease)   . Hearing difficulty of left ear    "grew up w/deaf parent; find myself lip reading more recently" (08/11/2014)  . Hepatitis    "exposed in college; rec'd gamma globulin; 6 months later S/S (weakness, lethargy, fevers); ; dx'd infectious hepatitis"  . History of herpes genitalis    "stress induced reoccurance that shows up on my left middle finger" (08/11/2014)  . History of hiatal hernia   . History of stomach ulcers   . Hypercholesterolemia   . Hyperthyroidism 1994   "postpartum only; resolved itself"  . Incisional hernia   . Internal hemorrhoids   . Leg cramping   . Osteopenia   . Seasonal asthma   . Stress incontinence, female   . Ulcer     Past Surgical History:  Procedure Laterality Date  . CESAREAN SECTION  1994  . CHALAZION EXCISION Left ~ 2013  . DILATION AND CURETTAGE OF UTERUS  X 2  . EYE SURGERY    . HERNIA REPAIR  08/11/2014  . INCISIONAL HERNIA REPAIR N/A 08/11/2014   Procedure: LAPAROSCOPIC  INCISIONAL HERNIA REPAIR;  Surgeon: Coralie Keens, MD;  Location: [place];  Service: General;  Laterality: N/A;  . INCONTINENCE SURGERY  2005  . INGUINAL HERNIA REPAIR Left 08/11/2014  . INGUINAL HERNIA REPAIR Left 08/11/2014   Procedure: LEFT INGUINAL HERNIA REPAIR;  Surgeon: Coralie Keens, MD;  Location: [place];  Service: General;  Laterality: Left;  . INSERTION OF MESH Left 08/11/2014   Procedure: INSERTION OF MESH TO LEFT GROIN AND ABDOMEN;  Surgeon: Coralie Keens, MD;  Location: [place];  Service: General;  Laterality: Left;  . LAPAROSCOPIC INCISIONAL / UMBILICAL / VENTRAL HERNIA REPAIR  08/11/2014   IHR  . NASAL POLYP EXCISION  2014  . REPAIR OF PERFORATED ULCER  1985  . TONSILLECTOMY    . TOTAL SHOULDER ARTHROPLASTY Left 09/21/2017   Procedure: LEFT TOTAL SHOULDER ARTHROPLASTY;  Surgeon: Nicholes Stairs, MD;  Location: [place];  Service: Orthopedics;  Laterality: Left;    There were no vitals filed for this visit.  Subjective Assessment - 10/18/17 1407    Subjective  overall Rt hip is feeling much better; feels pain a little bit standing on one leg    Pertinent History  Lt shoulder surgery for TSA 09/21/17; removal Lt little toe phalanx 7/19; bunionectomy 11/16;  ingunial and lap hernia repair 6/16; sling proceedure for bladder; sinus surgery; C-section; ulcer repair 1985     Patient Stated Goals  get rid of the hip pain; get shoulder working again     Pain Score  0-No pain    Pain Score  0                       [facility] Adult PT Treatment/Exercise - 10/18/17 1408      Exercises   Exercises  Lumbar;Knee/Hip      Lumbar Exercises: Aerobic   Nustep  L5 x 7 min LE only       Knee/Hip Exercises: Stretches   Passive Hamstring Stretch  Right;Left;2 reps;30 seconds   seated   Piriformis Stretch  Right;Left;2 reps;30 seconds   seated     Knee/Hip Exercises: Standing   Heel Raises  Both;15 reps    Hip Abduction  Both;Knee straight;10 reps    Hip Extension   Both;10 reps;Knee straight    Forward Step Up  Both;10 reps;Hand Hold: 1;Step Height: 6"      Knee/Hip Exercises: Seated   Sit to Sand  10 reps;without UE support   mod cues for form     Moist Heat Therapy   Number Minutes Moist Heat  15 Minutes    Moist Heat Location  Hip      Electrical Stimulation   Electrical Stimulation Location  Rt post hip/glute    Electrical Stimulation Action  IFC    Electrical Stimulation Parameters  to tolerance x 15 min    Electrical Stimulation Goals  Pain;Tone                  PT Long Term Goals - 10/15/17 1702      PT LONG TERM GOAL #1   Title  Decrease pain in the Rt posterior hip by 50-75% allowing patient to participate in functional activities with minimal dysfunction or limitations 11/20/17    Time  6    Period  Weeks    Status  On-going      PT LONG TERM GOAL #2   Title  Improve hip mobility Rt to equal Lt 11/20/17    Time  6    Period  Weeks    Status  On-going      PT LONG TERM GOAL #3   Title  Independent in HEP 11/20/17    Time  6    Period  Weeks    Status  On-going      PT LONG TERM GOAL #4   Title  Improve/maintain FOTO to </= 40% limitation 11/20/17    Time  6    Period  Weeks    Status  On-going      PT LONG TERM GOAL #5   Title  improve Lt shoulder ROM to WFL's and within 5-10 deg of AROM Rt shoulder 11/26/17    Time  6    Period  Weeks    Status  New      PT LONG TERM GOAL #6   Title  Increase strength Lt UE to 4/5 to 4+/5  throughout 11/26/17    Time  6    Period  Weeks    Status  New      PT LONG TERM GOAL #7   Title  Patient to return to normal functional activities using Lt UE 11/26/17    Time  6    Period  Weeks  Status  New            Plan - 10/18/17 1445    Clinical Impression Statement  Overall pt doing well and reporting minimal pain in Rt hip, which she feels with standing activities.  Pain subsides once stopping exercise or activity.  No goals met to date, but progressing  well with PT.    Rehab Potential  Good    PT Frequency  2x / week    PT Duration  6 weeks    PT Treatment/Interventions  Patient/family education;ADLs/Self Care Home Management;Cryotherapy;Electrical Stimulation;Iontophoresis 32m/ml Dexamethasone;Moist Heat;Ultrasound;Dry needling;Manual techniques;Neuromuscular re-education;Therapeutic activities;Therapeutic exercise    PT Next Visit Plan  progress with stretching and strengthening, including core; manual work and modalities as indicated     Consulted and Agree with Plan of Care  Patient;Family member/caregiver       Patient will benefit from skilled therapeutic intervention in order to improve the following deficits and impairments:  Postural dysfunction, Improper body mechanics, Pain, Increased fascial restricitons, Increased muscle spasms, Decreased mobility, Decreased range of motion, Decreased activity tolerance  Visit Diagnosis: Pain in right hip  Other symptoms and signs involving the musculoskeletal system  Chronic left shoulder pain  Muscle weakness (generalized)  Localized edema     Problem List Patient Active Problem List   Diagnosis Date Noted  . 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  . IFG (impaired fasting glucose) 08/03/2017  . Primary osteoarthritis of both knees 07/29/2017  . Osteoarthritis, localized, shoulder, left 07/29/2017  . Sinusitis, chronic   . Asthma 01/08/2012  . Dyslipidemia   . ADD (attention deficit disorder)   . Depression   . Osteopenia   . Incisional hernia   . Stress incontinence, female       SLaureen Abrahams PT, DPT 10/18/17 2:47 PM    [place] [address]Phone: [phone]  Fax:  [phone] Name: SKANNA DAFOEMRN: [id number] of Birth: [birth year]

## 2017-10-19 ENCOUNTER — Ambulatory Visit (INDEPENDENT_AMBULATORY_CARE_PROVIDER_SITE_OTHER): Payer: BC Managed Care – PPO | Admitting: Physical Therapy

## 2017-10-19 ENCOUNTER — Encounter: Payer: Self-pay | Admitting: Physical Therapy

## 2017-10-19 DIAGNOSIS — M25512 Pain in left shoulder: Secondary | ICD-10-CM

## 2017-10-19 DIAGNOSIS — M25551 Pain in right hip: Secondary | ICD-10-CM

## 2017-10-19 DIAGNOSIS — R29898 Other symptoms and signs involving the musculoskeletal system: Secondary | ICD-10-CM

## 2017-10-19 DIAGNOSIS — R600 Localized edema: Secondary | ICD-10-CM

## 2017-10-19 DIAGNOSIS — G8929 Other chronic pain: Secondary | ICD-10-CM

## 2017-10-19 DIAGNOSIS — M6281 Muscle weakness (generalized): Secondary | ICD-10-CM | POA: Diagnosis not present

## 2017-10-19 NOTE — Therapy (Signed)
[place], [place], [address] Phone: [phone]   Fax:  [phone]  Physical Therapy Treatment  Patient Details  Name: [name] MRN: [id number] Date of Birth: 07-Oct-1950 Referring Provider: Dr Lynne Leader - hip; Dr Victorino December - shoulder    Encounter Date: 10/19/2017  PT End of Session - 10/19/17 1014    Visit Number  6    Number of Visits  24    Date for PT Re-Evaluation  11/23/17    PT Start Time  0930    PT Stop Time  1028    PT Time Calculation (min)  58 min    Activity Tolerance  Patient tolerated treatment well    Behavior During Therapy  [facility] for tasks assessed/performed       Past Medical History:  Diagnosis Date  . ADD (attention deficit disorder)   . Arthritis    "bunion on  right" (08/11/2014)  . Chronic bronchitis ([place])    "plenty; but not q yr" (08/11/2014)  . Depression   . Dyslipidemia   . Environmental allergies   . GERD (gastroesophageal reflux disease)   . Hearing difficulty of left ear    "grew up w/deaf parent; find myself lip reading more recently" (08/11/2014)  . Hepatitis    "exposed in college; rec'd gamma globulin; 6 months later S/S (weakness, lethargy, fevers); ; dx'd infectious hepatitis"  . History of herpes genitalis    "stress induced reoccurance that shows up on my left middle finger" (08/11/2014)  . History of hiatal hernia   . History of stomach ulcers   . Hypercholesterolemia   . Hyperthyroidism 1994   "postpartum only; resolved itself"  . Incisional hernia   . Internal hemorrhoids   . Leg cramping   . Osteopenia   . Seasonal asthma   . Stress incontinence, female   . Ulcer     Past Surgical History:  Procedure Laterality Date  . CESAREAN SECTION  1994  . CHALAZION EXCISION Left ~ 2013  . DILATION AND CURETTAGE OF UTERUS  X 2  . EYE SURGERY    . HERNIA REPAIR  08/11/2014  . INCISIONAL HERNIA REPAIR N/A 08/11/2014   Procedure: LAPAROSCOPIC  INCISIONAL HERNIA REPAIR;  Surgeon: Coralie Keens, MD;  Location: [place];  Service: General;  Laterality: N/A;  . INCONTINENCE SURGERY  2005  . INGUINAL HERNIA REPAIR Left 08/11/2014  . INGUINAL HERNIA REPAIR Left 08/11/2014   Procedure: LEFT INGUINAL HERNIA REPAIR;  Surgeon: Coralie Keens, MD;  Location: [place];  Service: General;  Laterality: Left;  . INSERTION OF MESH Left 08/11/2014   Procedure: INSERTION OF MESH TO LEFT GROIN AND ABDOMEN;  Surgeon: Coralie Keens, MD;  Location: [place];  Service: General;  Laterality: Left;  . LAPAROSCOPIC INCISIONAL / UMBILICAL / VENTRAL HERNIA REPAIR  08/11/2014   IHR  . NASAL POLYP EXCISION  2014  . REPAIR OF PERFORATED ULCER  1985  . TONSILLECTOMY    . TOTAL SHOULDER ARTHROPLASTY Left 09/21/2017   Procedure: LEFT TOTAL SHOULDER ARTHROPLASTY;  Surgeon: Nicholes Stairs, MD;  Location: [place];  Service: Orthopedics;  Laterality: Left;    There were no vitals filed for this visit.  Subjective Assessment - 10/19/17 0931    Subjective  shoulder is doing well    Patient Stated Goals  get rid of the hip pain; get shoulder working again     Currently in Pain?  No/denies  [facility] Adult PT Treatment/Exercise - 10/19/17 0932      Exercises   Exercises  Shoulder      Shoulder Exercises: Isometric Strengthening   Flexion Limitations  10x5"    Extension Limitations  10x5"    External Rotation Limitations  --    Internal Rotation Limitations  10x5"      Electrical Stimulation   Electrical Stimulation Location  Lt shoulder girdle     Electrical Stimulation Action  IFC    Electrical Stimulation Parameters  to tolerance x 15 min    Electrical Stimulation Goals  Pain;Tone      Vasopneumatic   Number Minutes Vasopneumatic   15 minutes    Vasopnuematic Location   Shoulder    Vasopneumatic Pressure  Low    Vasopneumatic Temperature   34 deg       Manual Therapy   Passive ROM  gentle PROM Lt shoulder in flexin,  scaption and ERto ~ 10 deg                   PT Long Term Goals - 10/15/17 1702      PT LONG TERM GOAL #1   Title  Decrease pain in the Rt posterior hip by 50-75% allowing patient to participate in functional activities with minimal dysfunction or limitations 11/20/17    Time  6    Period  Weeks    Status  On-going      PT LONG TERM GOAL #2   Title  Improve hip mobility Rt to equal Lt 11/20/17    Time  6    Period  Weeks    Status  On-going      PT LONG TERM GOAL #3   Title  Independent in HEP 11/20/17    Time  6    Period  Weeks    Status  On-going      PT LONG TERM GOAL #4   Title  Improve/maintain FOTO to </= 40% limitation 11/20/17    Time  6    Period  Weeks    Status  On-going      PT LONG TERM GOAL #5   Title  improve Lt shoulder ROM to WFL's and within 5-10 deg of AROM Rt shoulder 11/26/17    Time  6    Period  Weeks    Status  New      PT LONG TERM GOAL #6   Title  Increase strength Lt UE to 4/5 to 4+/5  throughout 11/26/17    Time  6    Period  Weeks    Status  New      PT LONG TERM GOAL #7   Title  Patient to return to normal functional activities using Lt UE 11/26/17    Time  6    Period  Weeks    Status  New            Plan - 10/19/17 1015    Clinical Impression Statement  Pt tolerated exercises well today for Lt shoulder, and initiated isometrics per protocol today.  Min cues needed for proper form and technique.  Progressing well with PT.    Rehab Potential  Good    PT Frequency  2x / week    PT Duration  6 weeks    PT Treatment/Interventions  Patient/family education;ADLs/Self Care Home Management;Cryotherapy;Electrical Stimulation;Iontophoresis 4mg /ml Dexamethasone;Moist Heat;Ultrasound;Dry needling;Manual techniques;Neuromuscular re-education;Therapeutic activities;Therapeutic exercise    PT Next Visit Plan  progress with stretching and strengthening,  including core; manual work and modalities as indicated, continue per  shoulder protocol    Consulted and Agree with Plan of Care  Patient;Family member/caregiver       Patient will benefit from skilled therapeutic intervention in order to improve the following deficits and impairments:  Postural dysfunction, Improper body mechanics, Pain, Increased fascial restricitons, Increased muscle spasms, Decreased mobility, Decreased range of motion, Decreased activity tolerance  Visit Diagnosis: Chronic left shoulder pain  Muscle weakness (generalized)  Localized edema  Pain in right hip  Other symptoms and signs involving the musculoskeletal system     Problem List Patient Active Problem List   Diagnosis Date Noted  . 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  . IFG (impaired fasting glucose) 08/03/2017  . Primary osteoarthritis of both knees 07/29/2017  . Osteoarthritis, localized, shoulder, left 07/29/2017  . Sinusitis, chronic   . Asthma 01/08/2012  . Dyslipidemia   . ADD (attention deficit disorder)   . Depression   . Osteopenia   . Incisional hernia   . Stress incontinence, female       Laureen Abrahams, PT, DPT 10/19/17 10:17 AM    [facility] [place], [place], [address] Phone: [phone]   Fax:  [phone]  Name: [name] MRN: [id number] Date of Birth: [birth year]

## 2017-10-22 ENCOUNTER — Encounter: Payer: Self-pay | Admitting: Rehabilitative and Restorative Service Providers"

## 2017-10-22 ENCOUNTER — Ambulatory Visit (INDEPENDENT_AMBULATORY_CARE_PROVIDER_SITE_OTHER): Payer: BC Managed Care – PPO | Admitting: Rehabilitative and Restorative Service Providers"

## 2017-10-22 DIAGNOSIS — G8929 Other chronic pain: Secondary | ICD-10-CM

## 2017-10-22 DIAGNOSIS — M6281 Muscle weakness (generalized): Secondary | ICD-10-CM

## 2017-10-22 DIAGNOSIS — R29898 Other symptoms and signs involving the musculoskeletal system: Secondary | ICD-10-CM

## 2017-10-22 DIAGNOSIS — M25512 Pain in left shoulder: Secondary | ICD-10-CM | POA: Diagnosis not present

## 2017-10-22 DIAGNOSIS — R600 Localized edema: Secondary | ICD-10-CM

## 2017-10-22 DIAGNOSIS — M25551 Pain in right hip: Secondary | ICD-10-CM | POA: Diagnosis not present

## 2017-10-22 NOTE — Therapy (Signed)
[place], [place], [address] Phone: [phone]   Fax:  [phone]  Physical Therapy Treatment  Patient Details  Name: [name] MRN: [id number] Date of Birth: Aug 17, 1950 Referring Provider: Dr Lynne Leader - hip; Dr Victorino December - shoulder    Encounter Date: 10/22/2017  PT End of Session - 10/22/17 0803    Visit Number  7    Number of Visits  24    Date for PT Re-Evaluation  11/23/17    PT Start Time  0800    PT Stop Time  0933    PT Time Calculation (min)  93 min    Activity Tolerance  Patient tolerated treatment well       Past Medical History:  Diagnosis Date  . ADD (attention deficit disorder)   . Arthritis    "bunion on  right" (08/11/2014)  . Chronic bronchitis ([place])    "plenty; but not q yr" (08/11/2014)  . Depression   . Dyslipidemia   . Environmental allergies   . GERD (gastroesophageal reflux disease)   . Hearing difficulty of left ear    "grew up w/deaf parent; find myself lip reading more recently" (08/11/2014)  . Hepatitis    "exposed in college; rec'd gamma globulin; 6 months later S/S (weakness, lethargy, fevers); ; dx'd infectious hepatitis"  . History of herpes genitalis    "stress induced reoccurance that shows up on my left middle finger" (08/11/2014)  . History of hiatal hernia   . History of stomach ulcers   . Hypercholesterolemia   . Hyperthyroidism 1994   "postpartum only; resolved itself"  . Incisional hernia   . Internal hemorrhoids   . Leg cramping   . Osteopenia   . Seasonal asthma   . Stress incontinence, female   . Ulcer     Past Surgical History:  Procedure Laterality Date  . CESAREAN SECTION  1994  . CHALAZION EXCISION Left ~ 2013  . DILATION AND CURETTAGE OF UTERUS  X 2  . EYE SURGERY    . HERNIA REPAIR  08/11/2014  . INCISIONAL HERNIA REPAIR N/A 08/11/2014   Procedure: LAPAROSCOPIC INCISIONAL HERNIA REPAIR;  Surgeon: Coralie Keens, MD;   Location: [facility];  Service: General;  Laterality: N/A;  . INCONTINENCE SURGERY  2005  . INGUINAL HERNIA REPAIR Left 08/11/2014  . INGUINAL HERNIA REPAIR Left 08/11/2014   Procedure: LEFT INGUINAL HERNIA REPAIR;  Surgeon: Coralie Keens, MD;  Location: [facility];  Service: General;  Laterality: Left;  . INSERTION OF MESH Left 08/11/2014   Procedure: INSERTION OF MESH TO LEFT GROIN AND ABDOMEN;  Surgeon: Coralie Keens, MD;  Location: [place];  Service: General;  Laterality: Left;  . LAPAROSCOPIC INCISIONAL / UMBILICAL / VENTRAL HERNIA REPAIR  08/11/2014   IHR  . NASAL POLYP EXCISION  2014  . REPAIR OF PERFORATED ULCER  1985  . TONSILLECTOMY    . TOTAL SHOULDER ARTHROPLASTY Left 09/21/2017   Procedure: LEFT TOTAL SHOULDER ARTHROPLASTY;  Surgeon: Nicholes Stairs, MD;  Location: [facility];  Service: Orthopedics;  Laterality: Left;    There were no vitals filed for this visit.  Subjective Assessment - 10/22/17 0804    Subjective  Patient reports that she is habing trouble keeping the sling on the shoulder. No pain to speak of. The Rt hip and buttock is feeling better. She is having difficult getting her exercises done 3x/day.     Currently in Pain?  Yes  Pain Score  1     Pain Location  Hip    Pain Orientation  Right    Pain Descriptors / Indicators  Aching;Discomfort    Pain Type  Acute pain    Pain Radiating Towards  posterior hip     Pain Score  0         [facility] PT Assessment - 10/22/17 0001      Assessment   Medical Diagnosis  Rt posterior hip pain     Referring Provider  Dr Lynne Leader - hip; Dr Victorino December - shoulder     Onset Date/Surgical Date  08/13/17   Lt shoulder surgery 09/21/17   Hand Dominance  Right    Next MD Visit  not with Dr Georgina Snell; 10/19    Prior Therapy  chiropractic care yrs ago       PROM   Right/Left Shoulder  --   pt supine   Left Shoulder Flexion  130 Degrees    Left Shoulder ABduction  108 Degrees   in scapular plane    Left Shoulder External Rotation   10 Degrees   in scapular plane                   [facility] Adult PT Treatment/Exercise - 10/22/17 0001      Lumbar Exercises: Aerobic   Nustep  L5 x 7 min LE only       Knee/Hip Exercises: Standing   Heel Raises  Both;20 reps    Knee Flexion  AROM;Right;Left;2 sets;10 reps    Knee Flexion Limitations  alternating tap foot to 12 inch step     Hip Abduction  Both;2 sets;10 reps;Knee straight    Hip Extension  Stengthening;Both;2 sets;10 reps;Knee straight    Forward Step Up  Both;2 sets;15 reps;Hand Hold: 1;Step Height: 4";Step Height: 6"    Forward Step Up Limitations  10 only Lt due to discomfort in the Lt knee     Functional Squat  2 sets;10 reps   shallow knee bend keeping knees behind toes    SLS  30 sec x 3 reps each LE    Rt UE support as needed      Knee/Hip Exercises: Seated   Sit to Sand  2 sets;10 reps;without UE support      Shoulder Exercises: Standing   Other Standing Exercises  scap squeeze 10 sec x 10; axial extension 10 sec x 5 w/ swim noodle     Other Standing Exercises  pendulum 30 CW/30 CCW       Shoulder Exercises: Isometric Strengthening   Flexion Limitations  10x5"    Extension Limitations  10x5"    Internal Rotation Limitations  10x5"      Shoulder Exercises: Stretch   Table Stretch - Flexion  5 reps;10 seconds      Moist Heat Therapy   Number Minutes Moist Heat  15 Minutes    Moist Heat Location  Hip      Electrical Stimulation   Electrical Stimulation Location  Lt shoulder girdle     Electrical Stimulation Action  IFC    Electrical Stimulation Parameters  to tolerance    Electrical Stimulation Goals  Pain;Tone      Vasopneumatic   Number Minutes Vasopneumatic   15 minutes    Vasopnuematic Location   Shoulder    Vasopneumatic Pressure  Low    Vasopneumatic Temperature   34 deg       Manual Therapy   Manual therapy  comments  pt supine     Soft tissue mobilization  soft tissue work through Lt shoulder girdle to pt tolerance      Passive ROM  PROM Lt shoulder in flexion, scaption and ER to ~ 20 deg              PT Education - 10/22/17 0926    Education Details  HEP     Person(s) Educated  Patient    Methods  Explanation;Demonstration;Tactile cues;Verbal cues;Handout    Comprehension  Verbalized understanding;Returned demonstration;Verbal cues required;Tactile cues required          PT Long Term Goals - 10/15/17 1702      PT LONG TERM GOAL #1   Title  Decrease pain in the Rt posterior hip by 50-75% allowing patient to participate in functional activities with minimal dysfunction or limitations 11/20/17    Time  6    Period  Weeks    Status  On-going      PT LONG TERM GOAL #2   Title  Improve hip mobility Rt to equal Lt 11/20/17    Time  6    Period  Weeks    Status  On-going      PT LONG TERM GOAL #3   Title  Independent in HEP 11/20/17    Time  6    Period  Weeks    Status  On-going      PT LONG TERM GOAL #4   Title  Improve/maintain FOTO to </= 40% limitation 11/20/17    Time  6    Period  Weeks    Status  On-going      PT LONG TERM GOAL #5   Title  improve Lt shoulder ROM to WFL's and within 5-10 deg of AROM Rt shoulder 11/26/17    Time  6    Period  Weeks    Status  New      PT LONG TERM GOAL #6   Title  Increase strength Lt UE to 4/5 to 4+/5  throughout 11/26/17    Time  6    Period  Weeks    Status  New      PT LONG TERM GOAL #7   Title  Patient to return to normal functional activities using Lt UE 11/26/17    Time  6    Period  Weeks    Status  New            Plan - 10/22/17 4782    Clinical Impression Statement  Decreasing pain in the Rt hip and minimal pain in the Lt shoulder. Patient tolerated ~ 30 min of LE exercises. PROM Lt houlser continues to increase within limits of protocol. Progressing well with hip and shoulder rehab.     Rehab Potential  Good    PT Frequency  2x / week    PT Duration  6 weeks    PT Treatment/Interventions  Patient/family  education;ADLs/Self Care Home Management;Cryotherapy;Electrical Stimulation;Iontophoresis 4mg /ml Dexamethasone;Moist Heat;Ultrasound;Dry needling;Manual techniques;Neuromuscular re-education;Therapeutic activities;Therapeutic exercise    PT Next Visit Plan  progress with stretching and strengthening, including core; manual work and modalities as indicated, continue per shoulder protocol    Consulted and Agree with Plan of Care  Patient;Family member/caregiver       Patient will benefit from skilled therapeutic intervention in order to improve the following deficits and impairments:  Postural dysfunction, Improper body mechanics, Pain, Increased fascial restricitons, Increased muscle spasms, Decreased mobility, Decreased range of motion, Decreased activity  tolerance  Visit Diagnosis: Chronic left shoulder pain  Muscle weakness (generalized)  Localized edema  Pain in right hip  Other symptoms and signs involving the musculoskeletal system     Problem List Patient Active Problem List   Diagnosis Date Noted  . 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  . IFG (impaired fasting glucose) 08/03/2017  . Primary osteoarthritis of both knees 07/29/2017  . Osteoarthritis, localized, shoulder, left 07/29/2017  . Sinusitis, chronic   . Asthma 01/08/2012  . Dyslipidemia   . ADD (attention deficit disorder)   . Depression   . Osteopenia   . Incisional hernia   . Stress incontinence, female     Everardo All PT, MPH  10/22/2017, 9:28 AM  [facility] [place], [place], [address] Phone: [phone]   Fax:  [phone]  Name: [name] MRN: [id number] Date of Birth: 10-20-1950

## 2017-10-22 NOTE — Patient Instructions (Addendum)
SIT TO STAND: No Device    Sit with feet shoulder-width apart, on floor. Lean chest forward, raise hips up from surface. Straighten hips and knees. Weight bear equally on left and right sides. __10_ reps per set, _1-2__ sets per day  Strengthening: Hip Extension - no band    With tubing around right ankle, face anchor and pull leg straight back. Repeat __10__ times per set. Do __2-3__ sets per session. Do __1__ sessions per day.    Strengthening: Hip Abduction - no band    With tubing around right leg, other side toward anchor, extend leg out from side. Lead with heel  Repeat __10__ times per set. Do __2-3__ sets per session. Do __1_ sessions per day.    Functional Quadriceps: Chair Squat    Keeping feet flat on floor, shoulder width apart, squat as low as is comfortable. Use support as necessary. Repeat __10__ times per set. Do __2__ sets per session. Do __2__ sessions per day.   Balance: Unilateral  HOLD on counter for safety     Attempt to balance on left leg, eyes open. Hold _30___ seconds. Repeat _3-4___ times per set. Do __2__ sessions per day.   Forward    Facing step, place one leg on step, flexed at hip. Step up slowly, bringing hips in line with knee and shoulder. Bring other foot onto step. Reverse process to step back down. Repeat with other leg. Do __1-__ repetitions, __2-3__ sets.

## 2017-10-25 ENCOUNTER — Other Ambulatory Visit: Payer: Self-pay | Admitting: *Deleted

## 2017-10-25 MED ORDER — DULOXETINE HCL 60 MG PO CPEP: 60.0000 mg | ORAL_CAPSULE | Freq: Every morning | ORAL | 3 refills | Status: DC

## 2017-10-25 MED ORDER — DULOXETINE HCL 30 MG PO CPEP: 30.0000 mg | ORAL_CAPSULE | Freq: Every day | ORAL | 3 refills | Status: DC

## 2017-10-26 ENCOUNTER — Encounter: Payer: Self-pay | Admitting: Rehabilitative and Restorative Service Providers"

## 2017-10-26 ENCOUNTER — Ambulatory Visit (INDEPENDENT_AMBULATORY_CARE_PROVIDER_SITE_OTHER): Payer: BC Managed Care – PPO | Admitting: Rehabilitative and Restorative Service Providers"

## 2017-10-26 ENCOUNTER — Encounter: Payer: Self-pay | Admitting: Podiatry

## 2017-10-26 DIAGNOSIS — M6281 Muscle weakness (generalized): Secondary | ICD-10-CM

## 2017-10-26 DIAGNOSIS — R600 Localized edema: Secondary | ICD-10-CM

## 2017-10-26 DIAGNOSIS — M25551 Pain in right hip: Secondary | ICD-10-CM

## 2017-10-26 DIAGNOSIS — M25512 Pain in left shoulder: Secondary | ICD-10-CM

## 2017-10-26 DIAGNOSIS — R29898 Other symptoms and signs involving the musculoskeletal system: Secondary | ICD-10-CM

## 2017-10-26 DIAGNOSIS — G8929 Other chronic pain: Secondary | ICD-10-CM

## 2017-10-26 NOTE — Therapy (Signed)
[facility] [place], [place], [address] Phone: [phone]   Fax:  [phone]  Physical Therapy Treatment  Patient Details  Name: FANCHON PAPANIA MRN: [id number] Date of Birth: [birth year] Referring Provider (PT): Dr Lynne Leader - hip; Dr Victorino December - shoulder    Encounter Date: 10/26/2017  PT End of Session - 10/26/17 0943    Visit Number  8    Number of Visits  24    Date for PT Re-Evaluation  11/23/17    PT Start Time  0938    PT Stop Time  1110    PT Time Calculation (min)  92 min    Activity Tolerance  Patient tolerated treatment well       Past Medical History:  Diagnosis Date  . ADD (attention deficit disorder)   . Arthritis    "bunion on  right" (08/11/2014)  . Chronic bronchitis ([place])    "plenty; but not q yr" (08/11/2014)  . Depression   . Dyslipidemia   . Environmental allergies   . GERD (gastroesophageal reflux disease)   . Hearing difficulty of left ear    "grew up w/deaf parent; find myself lip reading more recently" (08/11/2014)  . Hepatitis    "exposed in college; rec'd gamma globulin; 6 months later S/S (weakness, lethargy, fevers); ; dx'd infectious hepatitis"  . History of herpes genitalis    "stress induced reoccurance that shows up on my left middle finger" (08/11/2014)  . History of hiatal hernia   . History of stomach ulcers   . Hypercholesterolemia   . Hyperthyroidism 1994   "postpartum only; resolved itself"  . Incisional hernia   . Internal hemorrhoids   . Leg cramping   . Osteopenia   . Seasonal asthma   . Stress incontinence, female   . Ulcer     Past Surgical History:  Procedure Laterality Date  . CESAREAN SECTION  1994  . CHALAZION EXCISION Left ~ 2013  . DILATION AND CURETTAGE OF UTERUS  X 2  . EYE SURGERY    . HERNIA REPAIR  08/11/2014  . INCISIONAL HERNIA REPAIR N/A 08/11/2014   Procedure: LAPAROSCOPIC INCISIONAL HERNIA REPAIR;  Surgeon: Coralie Keens, MD;   Location: [facility];  Service: General;  Laterality: N/A;  . INCONTINENCE SURGERY  2005  . INGUINAL HERNIA REPAIR Left 08/11/2014  . INGUINAL HERNIA REPAIR Left 08/11/2014   Procedure: LEFT INGUINAL HERNIA REPAIR;  Surgeon: Coralie Keens, MD;  Location: [facility];  Service: General;  Laterality: Left;  . INSERTION OF MESH Left 08/11/2014   Procedure: INSERTION OF MESH TO LEFT GROIN AND ABDOMEN;  Surgeon: Coralie Keens, MD;  Location: [facility];  Service: General;  Laterality: Left;  . LAPAROSCOPIC INCISIONAL / UMBILICAL / VENTRAL HERNIA REPAIR  08/11/2014   IHR  . NASAL POLYP EXCISION  2014  . REPAIR OF PERFORATED ULCER  1985  . TONSILLECTOMY    . TOTAL SHOULDER ARTHROPLASTY Left 09/21/2017   Procedure: LEFT TOTAL SHOULDER ARTHROPLASTY;  Surgeon: Nicholes Stairs, MD;  Location: [facility];  Service: Orthopedics;  Laterality: Left;    There were no vitals filed for this visit.  Subjective Assessment - 10/26/17 0944    Subjective  Patient reports that she bumped her Lt arm on her purse Tuesday and has noticed increase in swelling in the Lt arm. Called the MD and he will see her if she feels she needs to have the arm checked. She has felt better in general  and is doing more around her house. She has not done as much with her exercises for the Lt shoulder or Rt hip.    Currently in Pain?  Yes    Pain Score  6     Pain Location  Hip    Pain Orientation  Right    Pain Descriptors / Indicators  Aching;Discomfort    Pain Score  0    Pain Location  Shoulder    Pain Orientation  Left    Pain Descriptors / Indicators  Aching    Pain Type  Chronic pain;Surgical pain                       [facility] Adult PT Treatment/Exercise - 10/26/17 0001      Lumbar Exercises: Stretches   Piriformis Stretch  Right;3 reps;30 seconds   varying angles    Piriformis Stretch Limitations  PT assist for piriformis stretch supine - travell stretch - 30 sec x 4 reps       Lumbar Exercises: Aerobic   Nustep   L5 x 10 min LE only       Lumbar Exercises: Supine   Clam  10 reps;2 seconds   alternate knees - green theraband    Bridge  10 reps;4 seconds   2 sets    Other Supine Lumbar Exercises  3 part core 10 sec x 10 verbal cues required     Other Supine Lumbar Exercises  ball squeeze 5 sec x 10 x 2 sets in hook lying - encouraging core engaged       Knee/Hip Exercises: Standing   Heel Raises  Both;20 reps    Knee Flexion  AROM;Right;Left;2 sets;10 reps    Knee Flexion Limitations  alternating tap foot to 12 inch step     Hip Abduction  Both;2 sets;10 reps;Knee straight    Hip Extension  Stengthening;Both;2 sets;10 reps;Knee straight    Forward Step Up  Both;2 sets;15 reps;Hand Hold: 1;Step Height: 6"   Lt knee OK today    Functional Squat  2 sets;10 reps   shallow knee bend keeping knees behind toes    SLS  30 sec x 3 reps each LE    Rt UE support as needed      Shoulder Exercises: Supine   Other Supine Exercises  scap squeeze 10 sec x 10       Shoulder Exercises: Standing   Other Standing Exercises  scap squeeze 10 sec x 10; axial extension 10 sec x 5 w/ swim noodle       Shoulder Exercises: Isometric Strengthening   Flexion Limitations  10x5"    Extension Limitations  10x5"    External Rotation Limitations  10x5'    Internal Rotation Limitations  10x5"    ABduction Limitations  10x5'      Moist Heat Therapy   Number Minutes Moist Heat  16 Minutes    Moist Heat Location  Hip      Electrical Stimulation   Electrical Stimulation Location  Lt shoulder girdle     Electrical Stimulation Action  IFC    Electrical Stimulation Parameters  to tolerance    Electrical Stimulation Goals  Pain;Tone      Vasopneumatic   Number Minutes Vasopneumatic   15 minutes    Vasopnuematic Location   Shoulder    Vasopneumatic Pressure  Low    Vasopneumatic Temperature   34 deg       Manual Therapy  Manual therapy comments  pt supine     Soft tissue mobilization  soft tissue work through Lt  shoulder girdle to pt tolerance     Passive ROM  PROM Lt shoulder in flexion, scaption and ER to ~ 20 deg                   PT Long Term Goals - 10/15/17 1702      PT LONG TERM GOAL #1   Title  Decrease pain in the Rt posterior hip by 50-75% allowing patient to participate in functional activities with minimal dysfunction or limitations 11/20/17    Time  6    Period  Weeks    Status  On-going      PT LONG TERM GOAL #2   Title  Improve hip mobility Rt to equal Lt 11/20/17    Time  6    Period  Weeks    Status  On-going      PT LONG TERM GOAL #3   Title  Independent in HEP 11/20/17    Time  6    Period  Weeks    Status  On-going      PT LONG TERM GOAL #4   Title  Improve/maintain FOTO to </= 40% limitation 11/20/17    Time  6    Period  Weeks    Status  On-going      PT LONG TERM GOAL #5   Title  improve Lt shoulder ROM to WFL's and within 5-10 deg of AROM Rt shoulder 11/26/17    Time  6    Period  Weeks    Status  New      PT LONG TERM GOAL #6   Title  Increase strength Lt UE to 4/5 to 4+/5  throughout 11/26/17    Time  6    Period  Weeks    Status  New      PT LONG TERM GOAL #7   Title  Patient to return to normal functional activities using Lt UE 11/26/17    Time  [place] - 10/26/17 0944    Clinical Impression Statement  Patient reports irritatioin of Lt arm when she bumped arm Tuesday. Note mild edema mid arm but no change in mobility or palpation. Tolerated all exercises well. Continues to progress with hip and shoulder rehab.     Rehab Potential  Good    PT Frequency  2x / week    PT Duration  6 weeks    PT Treatment/Interventions  Patient/family education;ADLs/Self Care Home Management;Cryotherapy;Electrical Stimulation;Iontophoresis 4mg /ml Dexamethasone;Moist Heat;Ultrasound;Dry needling;Manual techniques;Neuromuscular re-education;Therapeutic activities;Therapeutic exercise    PT Next Visit Plan   progress with stretching and strengthening, including core; manual work and modalities as indicated, continue per shoulder protocol    Consulted and Agree with Plan of Care  Patient;Family member/caregiver       Patient will benefit from skilled therapeutic intervention in order to improve the following deficits and impairments:  Postural dysfunction, Improper body mechanics, Pain, Increased fascial restricitons, Increased muscle spasms, Decreased mobility, Decreased range of motion, Decreased activity tolerance  Visit Diagnosis: Chronic left shoulder pain  Muscle weakness (generalized)  Localized edema  Pain in right hip  Other symptoms and signs involving the musculoskeletal system     Problem List Patient Active Problem List   Diagnosis Date Noted  .  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  . IFG (impaired fasting glucose) 08/03/2017  . Primary osteoarthritis of both knees 07/29/2017  . Osteoarthritis, localized, shoulder, left 07/29/2017  . Sinusitis, chronic   . Asthma 01/08/2012  . Dyslipidemia   . ADD (attention deficit disorder)   . Depression   . Osteopenia   . Incisional hernia   . Stress incontinence, female     Everardo All PT, MPH  10/26/2017, 11:01 AM  [facility] [place], [place], [address] Phone: [phone]   Fax:  [phone]  Name: SALLYE LUNZ MRN: [id number] Date of Birth: 12-03-1950

## 2017-10-26 NOTE — Progress Notes (Signed)
DOS 07/31/2017 Hammertoe Repair 5th RT; Webbing Procedure 4th RT

## 2017-10-29 ENCOUNTER — Ambulatory Visit (INDEPENDENT_AMBULATORY_CARE_PROVIDER_SITE_OTHER): Payer: BC Managed Care – PPO | Admitting: Physical Therapy

## 2017-10-29 ENCOUNTER — Encounter: Payer: Self-pay | Admitting: Physical Therapy

## 2017-10-29 DIAGNOSIS — M6281 Muscle weakness (generalized): Secondary | ICD-10-CM | POA: Diagnosis not present

## 2017-10-29 DIAGNOSIS — R600 Localized edema: Secondary | ICD-10-CM | POA: Diagnosis not present

## 2017-10-29 DIAGNOSIS — M25551 Pain in right hip: Secondary | ICD-10-CM

## 2017-10-29 DIAGNOSIS — M25512 Pain in left shoulder: Secondary | ICD-10-CM | POA: Diagnosis not present

## 2017-10-29 DIAGNOSIS — R29898 Other symptoms and signs involving the musculoskeletal system: Secondary | ICD-10-CM

## 2017-10-29 DIAGNOSIS — G8929 Other chronic pain: Secondary | ICD-10-CM

## 2017-10-29 NOTE — Therapy (Signed)
[facility] [place], [place], [address] Phone: [phone]   Fax:  [phone]  Physical Therapy Treatment  Patient Details  Name: [name] MRN: [id number] Date of Birth: 1950/09/10 Referring Provider (PT): Dr Lynne Leader - hip; Dr Victorino December - shoulder    Encounter Date: 10/29/2017  PT End of Session - 10/29/17 1556    Visit Number  9    Number of Visits  24    Date for PT Re-Evaluation  11/23/17    PT Start Time  2458   pt arrived late   PT Stop Time  1645    PT Time Calculation (min)  75 min    Activity Tolerance  Patient tolerated treatment well;No increased pain    Behavior During Therapy  WFL for tasks assessed/performed       Past Medical History:  Diagnosis Date  . ADD (attention deficit disorder)   . Arthritis    "bunion on  right" (08/11/2014)  . Chronic bronchitis ([place])    "plenty; but not q yr" (08/11/2014)  . Depression   . Dyslipidemia   . Environmental allergies   . GERD (gastroesophageal reflux disease)   . Hearing difficulty of left ear    "grew up w/deaf parent; find myself lip reading more recently" (08/11/2014)  . Hepatitis    "exposed in college; rec'd gamma globulin; 6 months later S/S (weakness, lethargy, fevers); ; dx'd infectious hepatitis"  . History of herpes genitalis    "stress induced reoccurance that shows up on my left middle finger" (08/11/2014)  . History of hiatal hernia   . History of stomach ulcers   . Hypercholesterolemia   . Hyperthyroidism 1994   "postpartum only; resolved itself"  . Incisional hernia   . Internal hemorrhoids   . Leg cramping   . Osteopenia   . Seasonal asthma   . Stress incontinence, female   . Ulcer     Past Surgical History:  Procedure Laterality Date  . CESAREAN SECTION  1994  . CHALAZION EXCISION Left ~ 2013  . DILATION AND CURETTAGE OF UTERUS  X 2  . EYE SURGERY    . HERNIA REPAIR  08/11/2014  . INCISIONAL HERNIA REPAIR  N/A 08/11/2014   Procedure: LAPAROSCOPIC INCISIONAL HERNIA REPAIR;  Surgeon: Coralie Keens, MD;  Location: [facility];  Service: General;  Laterality: N/A;  . INCONTINENCE SURGERY  2005  . INGUINAL HERNIA REPAIR Left 08/11/2014  . INGUINAL HERNIA REPAIR Left 08/11/2014   Procedure: LEFT INGUINAL HERNIA REPAIR;  Surgeon: Coralie Keens, MD;  Location: [facility];  Service: General;  Laterality: Left;  . INSERTION OF MESH Left 08/11/2014   Procedure: INSERTION OF MESH TO LEFT GROIN AND ABDOMEN;  Surgeon: Coralie Keens, MD;  Location: [facility];  Service: General;  Laterality: Left;  . LAPAROSCOPIC INCISIONAL / UMBILICAL / VENTRAL HERNIA REPAIR  08/11/2014   IHR  . NASAL POLYP EXCISION  2014  . REPAIR OF PERFORATED ULCER  1985  . TONSILLECTOMY    . TOTAL SHOULDER ARTHROPLASTY Left 09/21/2017   Procedure: LEFT TOTAL SHOULDER ARTHROPLASTY;  Surgeon: Nicholes Stairs, MD;  Location: [facility];  Service: Orthopedics;  Laterality: Left;    There were no vitals filed for this visit.  Subjective Assessment - 10/29/17 1536    Subjective  Pt reports she received her TENS unit and this has helped.  She now has a stiff Rt neck and continued pain in her Rt hip.  She has  had a hard time sleeping, transitioning from couch to bed.      Currently in Pain?  Yes    Pain Score  1     Pain Location  Hip    Pain Orientation  Right    Pain Descriptors / Indicators  Aching    Aggravating Factors   rolling on hip, stepping down     Pain Relieving Factors  TENS, medication    Pain Score  0    Pain Location  Shoulder    Pain Orientation  Left         [facility] PT Assessment - 10/29/17 0001      Assessment   Medical Diagnosis  Rt posterior hip pain     Referring Provider (PT)  Dr Lynne Leader - hip; Dr Victorino December - shoulder     Onset Date/Surgical Date  08/13/17   Lt shoulder surgery 09/21/17   Hand Dominance  Right    Next MD Visit  not with Dr Georgina Snell; 10/19      Precautions   Precaution Comments  Lt TSA         [facility] Adult PT Treatment/Exercise - 10/29/17 0001      Exercises   Exercises  Lumbar      Lumbar Exercises: Stretches   Quad Stretch  Right;Left;1 rep;20 seconds   attempting in sitting with foot under chair   Quad Stretch Limitations  minimal stretch reported    Piriformis Stretch  Right;30 seconds;4 reps   varying angles    Piriformis Stretch Limitations  PTA assist for piriformis stretch supine - travell stretch - 30 sec x 4 reps       Lumbar Exercises: Aerobic   Nustep  L4 x 7 min LE only       Lumbar Exercises: Supine   Clam  --    Bridge with [employer]  10 reps;2 seconds    Other Supine Lumbar Exercises  3 part core 10 sec x 10       Knee/Hip Exercises: Standing   Hip Abduction  Stengthening;Both;10 reps;Knee straight;2 sets   with functional squat   Functional Squat  10 seconds;2 sets   with hip abdct     Shoulder Exercises: Supine   Other Supine Exercises  scap squeeze 10 sec x 10       Shoulder Exercises: Isometric Strengthening   Flexion Limitations  10x5"    Extension Limitations  10x5"    External Rotation Limitations  10x5'    Internal Rotation Limitations  10x5"    ABduction Limitations  10x5'      Moist Heat Therapy   Number Minutes Moist Heat  15 Minutes    Moist Heat Location  Hip   Rt     Electrical Stimulation   Electrical Stimulation Location  Lt shoulder; Rt hip     Electrical Stimulation Action  premod to each area    Electrical Stimulation Parameters  to tolerance    Electrical Stimulation Goals  Pain;Tone      Vasopneumatic   Number Minutes Vasopneumatic   15 minutes    Vasopnuematic Location   Shoulder   Lt   Vasopneumatic Pressure  Low    Vasopneumatic Temperature   34 deg       Manual Therapy   Manual therapy comments  pt supine     Soft tissue mobilization  soft tissue work through Lt shoulder girdle to pt tolerance     Passive ROM  PROM Lt shoulder  in flexion, scaption and ER to ~ 20 deg                   PT  Long Term Goals - 10/29/17 1553      PT LONG TERM GOAL #1   Title  Decrease pain in the Rt posterior hip by 50-75% allowing patient to participate in functional activities with minimal dysfunction or limitations 11/20/17    Time  6    Period  Weeks    Status  Partially Met   50% reduction      PT LONG TERM GOAL #2   Title  Improve hip mobility Rt to equal Lt 11/20/17    Time  6    Period  Weeks    Status  On-going      PT LONG TERM GOAL #3   Title  Independent in HEP 11/20/17    Time  6    Period  Weeks    Status  On-going      PT LONG TERM GOAL #4   Title  Improve/maintain FOTO to </= 40% limitation 11/20/17    Time  6    Period  Weeks    Status  On-going      PT LONG TERM GOAL #5   Title  improve Lt shoulder ROM to WFL's and within 5-10 deg of AROM Rt shoulder 11/26/17    Time  6    Period  Weeks    Status  On-going      PT LONG TERM GOAL #6   Title  Increase strength Lt UE to 4/5 to 4+/5  throughout 11/26/17    Time  6    Period  Weeks    Status  On-going      PT LONG TERM GOAL #7   Title  Patient to return to normal functional activities using Lt UE 11/26/17    Time  6    Period  Weeks    Status  On-going            Plan - 10/29/17 1601    Clinical Impression Statement  Pt is now 5 wks s/p TSA surgery.  She reports 50% reduction in hip pain since initiating therapy.  Pt is progressing well through exercise, within shoulder protocol.     Rehab Potential  Good    PT Frequency  2x / week    PT Duration  6 weeks    PT Treatment/Interventions  Patient/family education;ADLs/Self Care Home Management;Cryotherapy;Electrical Stimulation;Iontophoresis '4mg'$ /ml Dexamethasone;Moist Heat;Ultrasound;Dry needling;Manual techniques;Neuromuscular re-education;Therapeutic activities;Therapeutic exercise    PT Next Visit Plan  progress with stretching and strengthening, including core; manual work and modalities as indicated, continue per shoulder protocol    Consulted and  Agree with Plan of Care  Patient       Patient will benefit from skilled therapeutic intervention in order to improve the following deficits and impairments:  Postural dysfunction, Improper body mechanics, Pain, Increased fascial restricitons, Increased muscle spasms, Decreased mobility, Decreased range of motion, Decreased activity tolerance  Visit Diagnosis: Chronic left shoulder pain  Muscle weakness (generalized)  Localized edema  Pain in right hip  Other symptoms and signs involving the musculoskeletal system     Problem List Patient Active Problem List   Diagnosis Date Noted  . 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  . IFG (impaired fasting glucose) 08/03/2017  . Primary osteoarthritis of both knees 07/29/2017  . Osteoarthritis, localized, shoulder, left 07/29/2017  . Sinusitis, chronic   .  Asthma 01/08/2012  . Dyslipidemia   . ADD (attention deficit disorder)   . Depression   . Osteopenia   . Incisional hernia   . Stress incontinence, female    Kerin Perna, [place] 10/29/17 4:43 PM  [facility] [place], [place], [address] Phone: [phone]   Fax:  [phone]  Name: ZERINA HALLINAN MRN: [id number] Date of Birth: 30-May-1950

## 2017-11-01 ENCOUNTER — Ambulatory Visit (INDEPENDENT_AMBULATORY_CARE_PROVIDER_SITE_OTHER): Payer: BC Managed Care – PPO | Admitting: Physical Therapy

## 2017-11-01 ENCOUNTER — Encounter: Payer: Self-pay | Admitting: Physical Therapy

## 2017-11-01 DIAGNOSIS — M6281 Muscle weakness (generalized): Secondary | ICD-10-CM

## 2017-11-01 DIAGNOSIS — R600 Localized edema: Secondary | ICD-10-CM | POA: Diagnosis not present

## 2017-11-01 DIAGNOSIS — M25512 Pain in left shoulder: Secondary | ICD-10-CM

## 2017-11-01 DIAGNOSIS — M25551 Pain in right hip: Secondary | ICD-10-CM | POA: Diagnosis not present

## 2017-11-01 DIAGNOSIS — G8929 Other chronic pain: Secondary | ICD-10-CM

## 2017-11-01 NOTE — Therapy (Signed)
[place], [place], [address] Phone: [phone]   Fax:  [phone]  Physical Therapy Treatment  Patient Details  Name: [name] MRN: [id number] Date of Birth: [birth year] Referring Provider (PT): Dr Lynne Leader - hip; Dr Victorino December - shoulder    Encounter Date: 11/01/2017  PT End of Session - 11/01/17 1029    Visit Number  10    Number of Visits  24    Date for PT Re-Evaluation  11/23/17    PT Start Time  0811   pt arrived late   PT Stop Time  0930    PT Time Calculation (min)  79 min    Activity Tolerance  Patient tolerated treatment well;No increased pain    Behavior During Therapy  WFL for tasks assessed/performed       Past Medical History:  Diagnosis Date  . ADD (attention deficit disorder)   . Arthritis    "bunion on  right" (08/11/2014)  . Chronic bronchitis ([place])    "plenty; but not q yr" (08/11/2014)  . Depression   . Dyslipidemia   . Environmental allergies   . GERD (gastroesophageal reflux disease)   . Hearing difficulty of left ear    "grew up w/deaf parent; find myself lip reading more recently" (08/11/2014)  . Hepatitis    "exposed in college; rec'd gamma globulin; 6 months later S/S (weakness, lethargy, fevers); ; dx'd infectious hepatitis"  . History of herpes genitalis    "stress induced reoccurance that shows up on my left middle finger" (08/11/2014)  . History of hiatal hernia   . History of stomach ulcers   . Hypercholesterolemia   . Hyperthyroidism 1994   "postpartum only; resolved itself"  . Incisional hernia   . Internal hemorrhoids   . Leg cramping   . Osteopenia   . Seasonal asthma   . Stress incontinence, female   . Ulcer     Past Surgical History:  Procedure Laterality Date  . CESAREAN SECTION  1994  . CHALAZION EXCISION Left ~ 2013  . DILATION AND CURETTAGE OF UTERUS  X 2  . EYE SURGERY    . HERNIA REPAIR  08/11/2014  . INCISIONAL HERNIA REPAIR  N/A 08/11/2014   Procedure: LAPAROSCOPIC INCISIONAL HERNIA REPAIR;  Surgeon: Coralie Keens, MD;  Location: [facility];  Service: General;  Laterality: N/A;  . INCONTINENCE SURGERY  2005  . INGUINAL HERNIA REPAIR Left 08/11/2014  . INGUINAL HERNIA REPAIR Left 08/11/2014   Procedure: LEFT INGUINAL HERNIA REPAIR;  Surgeon: Coralie Keens, MD;  Location: [facility];  Service: General;  Laterality: Left;  . INSERTION OF MESH Left 08/11/2014   Procedure: INSERTION OF MESH TO LEFT GROIN AND ABDOMEN;  Surgeon: Coralie Keens, MD;  Location: [facility];  Service: General;  Laterality: Left;  . LAPAROSCOPIC INCISIONAL / UMBILICAL / VENTRAL HERNIA REPAIR  08/11/2014   IHR  . NASAL POLYP EXCISION  2014  . REPAIR OF PERFORATED ULCER  1985  . TONSILLECTOMY    . TOTAL SHOULDER ARTHROPLASTY Left 09/21/2017   Procedure: LEFT TOTAL SHOULDER ARTHROPLASTY;  Surgeon: Nicholes Stairs, MD;  Location: [facility];  Service: Orthopedics;  Laterality: Left;    There were no vitals filed for this visit.  Subjective Assessment - 11/01/17 0816    Subjective  "I've done some bad things I probably shouldn't have done".  Pt reports she lifted her purse and groceries with her Lt shoulder and also lifted a box of  school supplies with both arms.    "I want to know how to bend so I don't irritate my butt".     Patient Stated Goals  get rid of the hip pain; get shoulder working again     Currently in Pain?  Yes    Pain Score  1    up to 4/10 with sitting in car   Pain Location  Hip    Pain Orientation  Right    Pain Descriptors / Indicators  Aching    Aggravating Factors   rolling on to hip; getting into car    Pain Relieving Factors  TENS         [facility] PT Assessment - 11/01/17 0001      PROM   Left Shoulder Flexion  130 Degrees    Left Shoulder External Rotation  25 Degrees   in scapular plane        [facility] Adult PT Treatment/Exercise - 11/01/17 0001      Exercises   Exercises  Lumbar      Lumbar Exercises: Stretches    Piriformis Stretch  Right;Left;2 reps;30 seconds   modified pigeon pose     Lumbar Exercises: Aerobic   Nustep  L4 x 6.5 min LE only       Lumbar Exercises: Supine   Clam  10 reps;2 seconds   alternate knees - green theraband    Bent Knee Raise  2 seconds;15 reps   with green band on thighs   Bridge with Ball Squeeze  10 reps;5 seconds      Knee/Hip Exercises: Standing   Hip Abduction  Stengthening;Both;10 reps;Knee straight;2 sets   with functional squat   Functional Squat  10 seconds;2 sets   with hip abdct     Knee/Hip Exercises: Seated   Sit to Sand  2 sets;without UE support   cues on decreasing forward hinge     Moist Heat Therapy   Number Minutes Moist Heat  15 Minutes    Moist Heat Location  Hip   Rt     Electrical Stimulation   Electrical Stimulation Location  Rt posterior hip    Electrical Stimulation Action  IFC    Electrical Stimulation Parameters   to tolerance     Electrical Stimulation Goals  Pain      Vasopneumatic   Number Minutes Vasopneumatic   15 minutes    Vasopnuematic Location   Shoulder   Lt   Vasopneumatic Pressure  Low    Vasopneumatic Temperature   34 deg       Manual Therapy   Manual Therapy  Myofascial release;Soft tissue mobilization;Passive ROM    Manual therapy comments  pt supine     Soft tissue mobilization  soft tissue work through [employer] shoulder girdle to pt tolerance     Myofascial Release  to Lt platysma, pec major and minor    Passive ROM  PROM Lt shoulder in flexion, ext, IR, scaption and ER to ~25 deg       sensitive skin Rock tape in zig zag pattern over Lt shoulder incision to assist with scar management.              PT Long Term Goals - 10/29/17 1553      PT LONG TERM GOAL #1   Title  Decrease pain in the Rt posterior hip by 50-75% allowing patient to participate in functional activities with minimal dysfunction or limitations 11/20/17    Time  6  Period  Weeks    Status  Partially Met   50% reduction       PT LONG TERM GOAL #2   Title  Improve hip mobility Rt to equal Lt 11/20/17    Time  6    Period  Weeks    Status  On-going      PT LONG TERM GOAL #3   Title  Independent in HEP 11/20/17    Time  6    Period  Weeks    Status  On-going      PT LONG TERM GOAL #4   Title  Improve/maintain FOTO to </= 40% limitation 11/20/17    Time  6    Period  Weeks    Status  On-going      PT LONG TERM GOAL #5   Title  improve Lt shoulder ROM to WFL's and within 5-10 deg of AROM Rt shoulder 11/26/17    Time  6    Period  Weeks    Status  On-going      PT LONG TERM GOAL #6   Title  Increase strength Lt UE to 4/5 to 4+/5  throughout 11/26/17    Time  6    Period  Weeks    Status  On-going      PT LONG TERM GOAL #7   Title  Patient to return to normal functional activities using Lt UE 11/26/17    Time  6    Period  Weeks    Status  On-going            Plan - 11/01/17 0925    Clinical Impression Statement  Pt admitted to doing some lifting with her Lt shoulder since last visit;  reminded pt of surgical protocol and encouraged her to maintain precautions.  Pt's Lt shoulder PROM is progressing well, within guidelines.      Rehab Potential  Good    PT Frequency  2x / week    PT Duration  6 weeks    PT Next Visit Plan  Progress to wk 6 of TSA protocol.  continue core/LE strengthening.     Consulted and Agree with Plan of Care  Patient       Patient will benefit from skilled therapeutic intervention in order to improve the following deficits and impairments:  Postural dysfunction, Improper body mechanics, Pain, Increased fascial restricitons, Increased muscle spasms, Decreased mobility, Decreased range of motion, Decreased activity tolerance  Visit Diagnosis: Chronic left shoulder pain  Muscle weakness (generalized)  Localized edema  Pain in right hip     Problem List Patient Active Problem List   Diagnosis Date Noted  . 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   . IFG (impaired fasting glucose) 08/03/2017  . Primary osteoarthritis of both knees 07/29/2017  . Osteoarthritis, localized, shoulder, left 07/29/2017  . Sinusitis, chronic   . Asthma 01/08/2012  . Dyslipidemia   . ADD (attention deficit disorder)   . Depression   . Osteopenia   . Incisional hernia   . Stress incontinence, female    Kerin Perna, PTA 11/01/17 12:43 PM  [place], [place], [address] Phone: [phone]   Fax:  [phone]  Name: [name] MRN: [id number] Date of Birth: 1950-05-02

## 2017-11-05 ENCOUNTER — Ambulatory Visit (INDEPENDENT_AMBULATORY_CARE_PROVIDER_SITE_OTHER): Payer: BC Managed Care – PPO | Admitting: Rehabilitative and Restorative Service Providers"

## 2017-11-05 ENCOUNTER — Encounter: Payer: Self-pay | Admitting: Rehabilitative and Restorative Service Providers"

## 2017-11-05 DIAGNOSIS — M25551 Pain in right hip: Secondary | ICD-10-CM | POA: Diagnosis not present

## 2017-11-05 DIAGNOSIS — M25512 Pain in left shoulder: Secondary | ICD-10-CM

## 2017-11-05 DIAGNOSIS — M6281 Muscle weakness (generalized): Secondary | ICD-10-CM

## 2017-11-05 DIAGNOSIS — R29898 Other symptoms and signs involving the musculoskeletal system: Secondary | ICD-10-CM

## 2017-11-05 DIAGNOSIS — R600 Localized edema: Secondary | ICD-10-CM

## 2017-11-05 DIAGNOSIS — G8929 Other chronic pain: Secondary | ICD-10-CM

## 2017-11-05 NOTE — Patient Instructions (Addendum)
Lying on back  Tighten shoulder blades down and back Left arm to 90 degrees with hand toward ceiling  Bring hand down toward hip about 2-3 inches then up toward pillow 2-3 inches  10 reps  Then side to side toward torso the toward floor 2-3 inches 10 reps  Then make small circles CW/CCW   Back to Wall: Scaption Flexion With Scapular Control - Standing    Lift  arms out diagonally from hip. Lower slowly,  shoulder blade down and back/girls up.  Do _10__ times, each arm, _2__ times per day.

## 2017-11-05 NOTE — Therapy (Signed)
[place], [place], [address] Phone: [phone]   Fax:  [phone]  Physical Therapy Treatment  Patient Details  Name: ROTHA CASSELS MRN: [id number] Date of Birth: 01-10-1951 Referring Provider (PT): Dr Lynne Leader - hip; Dr Victorino December - shoulder    Encounter Date: 11/05/2017  PT End of Session - 11/05/17 0811    Visit Number  11    Number of Visits  24    Date for PT Re-Evaluation  11/23/17    PT Start Time  0809    PT Stop Time  0935    PT Time Calculation (min)  86 min    Activity Tolerance  Patient tolerated treatment well       Past Medical History:  Diagnosis Date  . ADD (attention deficit disorder)   . Arthritis    "bunion on  right" (08/11/2014)  . Chronic bronchitis ([place])    "plenty; but not q yr" (08/11/2014)  . Depression   . Dyslipidemia   . Environmental allergies   . GERD (gastroesophageal reflux disease)   . Hearing difficulty of left ear    "grew up w/deaf parent; find myself lip reading more recently" (08/11/2014)  . Hepatitis    "exposed in college; rec'd gamma globulin; 6 months later S/S (weakness, lethargy, fevers); ; dx'd infectious hepatitis"  . History of herpes genitalis    "stress induced reoccurance that shows up on my left middle finger" (08/11/2014)  . History of hiatal hernia   . History of stomach ulcers   . Hypercholesterolemia   . Hyperthyroidism 1994   "postpartum only; resolved itself"  . Incisional hernia   . Internal hemorrhoids   . Leg cramping   . Osteopenia   . Seasonal asthma   . Stress incontinence, female   . Ulcer     Past Surgical History:  Procedure Laterality Date  . CESAREAN SECTION  1994  . CHALAZION EXCISION Left ~ 2013  . DILATION AND CURETTAGE OF UTERUS  X 2  . EYE SURGERY    . HERNIA REPAIR  08/11/2014  . INCISIONAL HERNIA REPAIR N/A 08/11/2014   Procedure: LAPAROSCOPIC INCISIONAL HERNIA REPAIR;  Surgeon: Coralie Keens,  MD;  Location: [facility];  Service: General;  Laterality: N/A;  . INCONTINENCE SURGERY  2005  . INGUINAL HERNIA REPAIR Left 08/11/2014  . INGUINAL HERNIA REPAIR Left 08/11/2014   Procedure: LEFT INGUINAL HERNIA REPAIR;  Surgeon: Coralie Keens, MD;  Location: [facility];  Service: General;  Laterality: Left;  . INSERTION OF MESH Left 08/11/2014   Procedure: INSERTION OF MESH TO LEFT GROIN AND ABDOMEN;  Surgeon: Coralie Keens, MD;  Location: [facility];  Service: General;  Laterality: Left;  . LAPAROSCOPIC INCISIONAL / UMBILICAL / VENTRAL HERNIA REPAIR  08/11/2014   IHR  . NASAL POLYP EXCISION  2014  . REPAIR OF PERFORATED ULCER  1985  . TONSILLECTOMY    . TOTAL SHOULDER ARTHROPLASTY Left 09/21/2017   Procedure: LEFT TOTAL SHOULDER ARTHROPLASTY;  Surgeon: Nicholes Stairs, MD;  Location: [facility];  Service: Orthopedics;  Laterality: Left;    There were no vitals filed for this visit.  Subjective Assessment - 11/05/17 0813    Subjective  Bathed and dressed herself today independently. She is feeling better overall. She has some pain in the opposite shoulder and neck area due to the sling.     Pain Score  1     Pain Location  Hip    Pain  Orientation  Right    Pain Descriptors / Indicators  Tightness   stiff   Pain Score  0    Pain Location  Shoulder    Pain Orientation  Left         [facility] PT Assessment - 11/05/17 0001      Assessment   Medical Diagnosis  Rt posterior hip pain     Referring Provider (PT)  Dr Lynne Leader - hip; Dr Victorino December - shoulder     Onset Date/Surgical Date  08/13/17   Lt TSA 09/21/17   Hand Dominance  Right    Next MD Visit  Dr Victorino December 11/14/17      Precautions   Precaution Comments  Lt TSA       PROM   Left Shoulder Flexion  135 Degrees    Left Shoulder ABduction  122 Degrees   in scapular plane   Left Shoulder Internal Rotation  48 Degrees   in scapular plane   Left Shoulder External Rotation  30 Degrees   in scapular plane      Strength    Right/Left Hip  --   WFL's bilat LE's    Right/Left Knee  --   WFL's bilat LE's      Flexibility   Piriformis  decreasing tightness Rt       Palpation   Palpation comment  muscular tightness Lt shoulder girdle; Rt posterior hip                    [facility] Adult PT Treatment/Exercise - 11/05/17 0001      Lumbar Exercises: Aerobic   Nustep  L5 x 6 min LE only       Lumbar Exercises: Supine   Clam  10 reps;2 seconds   alternate knees - green theraband    Bridge  10 reps   10 sec hold green TB above knees    Bridge with Ball Squeeze  10 reps;5 seconds      Knee/Hip Exercises: Stretches   Piriformis Stretch  Right;Left;3 reps;30 seconds   PT assist      Knee/Hip Exercises: Standing   Heel Raises  Both;20 reps    Hip Abduction  Stengthening;Both;10 reps;Knee straight;2 sets   with functional squat   Abduction Limitations  green TB     Hip Extension  Stengthening;Both;2 sets;10 reps;Knee straight    Extension Limitations  green TB     Forward Step Up  Right;Left;2 sets;10 reps;Hand Hold: 1;Step Height: 6"    Functional Squat  10 reps;3 seconds    SLS  30 sec x 3 reps each LE    Rt UE support as needed verbal cues to avoid hyperextension      Knee/Hip Exercises: Seated   Sit to Sand  2 sets;10 reps;without UE support      Shoulder Exercises: Supine   Other Supine Exercises  scap squeeze 10 sec x 10     Other Supine Exercises  ARO Lt shoulder w/ scapular stabilization 90 deg small range flex/ext; horiz ab/add; circles CW/CCW x 10 each       Shoulder Exercises: Standing   ABduction  AROM;Left;Right;10 reps   scaption to ~ 75-80 deg      Cryotherapy   Number Minutes Cryotherapy  15 Minutes    Cryotherapy Location  Hip   posterior Rt    Type of Cryotherapy  Ice pack      Electrical Stimulation   Electrical Stimulation Location  Lt  shoulder girdle     Electrical Stimulation Action  IFC    Electrical Stimulation Parameters  to tolerance    Electrical  Stimulation Goals  Pain;Tone      Vasopneumatic   Number Minutes Vasopneumatic   15 minutes    Vasopnuematic Location   Shoulder   Lt   Vasopneumatic Pressure  Low    Vasopneumatic Temperature   34 deg       Manual Therapy   Manual therapy comments  pt supine     Soft tissue mobilization  soft tissue work through Lt shoulder girdle     Passive ROM  PROM Lt shoulder in flexion, scaption; IR; ER(rotation in scaption              PT Education - 11/05/17 0930    Education Details  HEP     Person(s) Educated  Patient    Methods  Explanation;Demonstration;Tactile cues;Verbal cues;Handout    Comprehension  Verbalized understanding;Returned demonstration;Verbal cues required;Tactile cues required          PT Long Term Goals - 10/29/17 1553      PT LONG TERM GOAL #1   Title  Decrease pain in the Rt posterior hip by 50-75% allowing patient to participate in functional activities with minimal dysfunction or limitations 11/20/17    Time  6    Period  Weeks    Status  Partially Met   50% reduction      PT LONG TERM GOAL #2   Title  Improve hip mobility Rt to equal Lt 11/20/17    Time  6    Period  Weeks    Status  On-going      PT LONG TERM GOAL #3   Title  Independent in HEP 11/20/17    Time  6    Period  Weeks    Status  On-going      PT LONG TERM GOAL #4   Title  Improve/maintain FOTO to </= 40% limitation 11/20/17    Time  6    Period  Weeks    Status  On-going      PT LONG TERM GOAL #5   Title  improve Lt shoulder ROM to WFL's and within 5-10 deg of AROM Rt shoulder 11/26/17    Time  6    Period  Weeks    Status  On-going      PT LONG TERM GOAL #6   Title  Increase strength Lt UE to 4/5 to 4+/5  throughout 11/26/17    Time  6    Period  Weeks    Status  On-going      PT LONG TERM GOAL #7   Title  Patient to return to normal functional activities using Lt UE 11/26/17    Time  6    Period  Weeks    Status  On-going            Plan - 11/05/17  3474    Clinical Impression Statement  Patient reports increasing independence in ADL's with less pain in the shoulder and hip. Continues to have some various aches and pains - knees; Rt neck and shoulder from sling. Patient tolerated exercises well in clinic and added active exercise Lt shoulder in supine and standing. Progressing well toward stated goals of rehab.     Rehab Potential  Good    PT Frequency  2x / week    PT Duration  6 weeks    PT Next  Visit Plan  Progress Lt shoulder rehab per TSA protocol.  continue core/LE strengthening.     Consulted and Agree with Plan of Care  Patient       Patient will benefit from skilled therapeutic intervention in order to improve the following deficits and impairments:  Postural dysfunction, Improper body mechanics, Pain, Increased fascial restricitons, Increased muscle spasms, Decreased mobility, Decreased range of motion, Decreased activity tolerance  Visit Diagnosis: Chronic left shoulder pain  Muscle weakness (generalized)  Localized edema  Pain in right hip  Other symptoms and signs involving the musculoskeletal system     Problem List Patient Active Problem List   Diagnosis Date Noted  . 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  . IFG (impaired fasting glucose) 08/03/2017  . Primary osteoarthritis of both knees 07/29/2017  . Osteoarthritis, localized, shoulder, left 07/29/2017  . Sinusitis, chronic   . Asthma 01/08/2012  . Dyslipidemia   . ADD (attention deficit disorder)   . Depression   . Osteopenia   . Incisional hernia   . Stress incontinence, female     Everardo All PT, MPH  11/05/2017, 9:34 AM  [facility] [place], [place], [address] Phone: [phone]   Fax:  [phone]  Name: HUBERTA TOMPKINS MRN: [id number] Date of Birth: [birth year]

## 2017-11-08 ENCOUNTER — Encounter: Payer: Self-pay | Admitting: Rehabilitative and Restorative Service Providers"

## 2017-11-08 ENCOUNTER — Ambulatory Visit (INDEPENDENT_AMBULATORY_CARE_PROVIDER_SITE_OTHER): Payer: BC Managed Care – PPO | Admitting: Rehabilitative and Restorative Service Providers"

## 2017-11-08 DIAGNOSIS — M6281 Muscle weakness (generalized): Secondary | ICD-10-CM | POA: Diagnosis not present

## 2017-11-08 DIAGNOSIS — M25551 Pain in right hip: Secondary | ICD-10-CM

## 2017-11-08 DIAGNOSIS — G8929 Other chronic pain: Secondary | ICD-10-CM

## 2017-11-08 DIAGNOSIS — R600 Localized edema: Secondary | ICD-10-CM | POA: Diagnosis not present

## 2017-11-08 DIAGNOSIS — M25512 Pain in left shoulder: Secondary | ICD-10-CM

## 2017-11-08 DIAGNOSIS — R29898 Other symptoms and signs involving the musculoskeletal system: Secondary | ICD-10-CM

## 2017-11-08 NOTE — Patient Instructions (Addendum)
Progressive Resisted: External Rotation (Side-Lying)     Lying on right side, raise right forearm toward ceiling. Keep elbow bent and at side. Pause 2-3 sec Repeat __10_ times per set. Do __1-2__ sets per session. Do __1-2__ sessions per day.  Lying on back elbow at 90 deg; push had toward ceiling. Squeeze shoulder blades down and back.  Move hand up and down slowly x 10; side to side slowly x 10; circles CW/CCW slowly x 10

## 2017-11-08 NOTE — Therapy (Signed)
[facility] [place], [place], [address] Phone: [phone]   Fax:  [phone]  Physical Therapy Treatment  Patient Details  Name: [name] MRN: [id number] Date of Birth: 07-27-1950 Referring Provider (PT): Dr Lynne Leader - hip; Dr Victorino December - shoulder    Encounter Date: 11/08/2017  PT End of Session - 11/08/17 0813    Visit Number  12    Number of Visits  24    Date for PT Re-Evaluation  11/23/17    PT Start Time  0804    PT Stop Time  0925    PT Time Calculation (min)  81 min    Activity Tolerance  Patient tolerated treatment well       Past Medical History:  Diagnosis Date  . ADD (attention deficit disorder)   . Arthritis    "bunion on  right" (08/11/2014)  . Chronic bronchitis ([place])    "plenty; but not q yr" (08/11/2014)  . Depression   . Dyslipidemia   . Environmental allergies   . GERD (gastroesophageal reflux disease)   . Hearing difficulty of left ear    "grew up w/deaf parent; find myself lip reading more recently" (08/11/2014)  . Hepatitis    "exposed in college; rec'd gamma globulin; 6 months later S/S (weakness, lethargy, fevers); ; dx'd infectious hepatitis"  . History of herpes genitalis    "stress induced reoccurance that shows up on my left middle finger" (08/11/2014)  . History of hiatal hernia   . History of stomach ulcers   . Hypercholesterolemia   . Hyperthyroidism 1994   "postpartum only; resolved itself"  . Incisional hernia   . Internal hemorrhoids   . Leg cramping   . Osteopenia   . Seasonal asthma   . Stress incontinence, female   . Ulcer     Past Surgical History:  Procedure Laterality Date  . CESAREAN SECTION  1994  . CHALAZION EXCISION Left ~ 2013  . DILATION AND CURETTAGE OF UTERUS  X 2  . EYE SURGERY    . HERNIA REPAIR  08/11/2014  . INCISIONAL HERNIA REPAIR N/A 08/11/2014   Procedure: LAPAROSCOPIC INCISIONAL HERNIA REPAIR;  Surgeon: Coralie Keens,  MD;  Location: [place];  Service: General;  Laterality: N/A;  . INCONTINENCE SURGERY  2005  . INGUINAL HERNIA REPAIR Left 08/11/2014  . INGUINAL HERNIA REPAIR Left 08/11/2014   Procedure: LEFT INGUINAL HERNIA REPAIR;  Surgeon: Coralie Keens, MD;  Location: [place];  Service: General;  Laterality: Left;  . INSERTION OF MESH Left 08/11/2014   Procedure: INSERTION OF MESH TO LEFT GROIN AND ABDOMEN;  Surgeon: Coralie Keens, MD;  Location: [facility] [facility];  Service: General;  Laterality: Left;  . LAPAROSCOPIC INCISIONAL / UMBILICAL / VENTRAL HERNIA REPAIR  08/11/2014   [facility]  . NASAL POLYP EXCISION  2014  . REPAIR OF PERFORATED ULCER  1985  . TONSILLECTOMY    . TOTAL SHOULDER ARTHROPLASTY Left 09/21/2017   Procedure: LEFT TOTAL SHOULDER ARTHROPLASTY;  Surgeon: Nicholes Stairs, MD;  Location: [place];  Service: Orthopedics;  Laterality: Left;    There were no vitals filed for this visit.  Subjective Assessment - 11/08/17 0816    Subjective  No pain coming in the clinic today. Noted pain Lt knee to thigh with nustep. She did not sleep well last night - was doing her band exercises lying in bed for about 10 min.      Currently in Pain?  No/denies  Pain Score  0-No pain    Pain Score  0         [facility] PT Assessment - 11/08/17 0001      Assessment   Medical Diagnosis  Rt posterior hip pain     Referring Provider (PT)  Dr Lynne Leader - hip; Dr Victorino December - shoulder     Onset Date/Surgical Date  08/13/17   Lt TSA 09/21/17   Hand Dominance  Right    Next MD Visit  Dr Victorino December 11/14/17      Precautions   Precaution Comments  Lt TSA       Flexibility   Piriformis  improved mobility with PT assist       Palpation   Palpation comment  muscular tightness Lt shoulder girdle; Rt posterior hip                    [facility] Adult PT Treatment/Exercise - 11/08/17 0001      Lumbar Exercises: Stretches   Piriformis Stretch  Right;Left;2 reps;30 seconds   PT assist for stretch to Rt       Lumbar Exercises: Aerobic   Nustep  L5 x 3.75 min LE only       Lumbar Exercises: Supine   Clam  10 reps;2 seconds   alternate knees - green theraband    Bent Knee Raise  10 reps;2 seconds    Bridge  10 reps   no band      Knee/Hip Exercises: Stretches   Piriformis Stretch  Right;Left;3 reps;30 seconds   PT assist      Knee/Hip Exercises: Standing   Hip Abduction  Stengthening;Both;10 reps;Knee straight;2 sets   with functional squat   Hip Extension  Stengthening;Both;2 sets;10 reps;Knee straight    Functional Squat  10 reps;3 seconds      Knee/Hip Exercises: Seated   Sit to Sand  10 reps;without UE support      Shoulder Exercises: Supine   Other Supine Exercises  scap squeeze 10 sec x 10     Other Supine Exercises  AROM Lt shoulder w/ scapular stabilization 90 deg small range flex/ext; horiz ab/add; circles CW/CCW x 10 each       Shoulder Exercises: Sidelying   External Rotation  AROM;Left;10 reps   3 sec hold      Shoulder Exercises: Standing   ABduction  AROM;Left;Right;10 reps   scaption to ~ 75-80 deg    Other Standing Exercises  scap squeeze 10 sec x 10; axial extension 10 sec x 5 w/ swim noodle     Other Standing Exercises  pendulum 30 CW/30 CCW       Shoulder Exercises: Pulleys   Flexion  --   2 sec pause x 10 reps - no pain focus on keeping scap down      Shoulder Exercises: Isometric Strengthening   Flexion Limitations  10x5"    Extension Limitations  10x5"    External Rotation Limitations  10x5'    Internal Rotation Limitations  10x5"    ABduction Limitations  10x5'      Cryotherapy   Number Minutes Cryotherapy  15 Minutes    Cryotherapy Location  Hip   posterior Rt and Lt    Type of Cryotherapy  Ice pack      Electrical Stimulation   Electrical Stimulation Location  Lt shoulder girdle     Electrical Stimulation Action  IFC    Electrical Stimulation Parameters  to tolerance  Electrical Stimulation Goals  Pain;Tone      Vasopneumatic   Number  Minutes Vasopneumatic   15 minutes    Vasopnuematic Location   Shoulder   Lt   Vasopneumatic Pressure  Low    Vasopneumatic Temperature   34 deg       Manual Therapy   Manual therapy comments  pt supine     Soft tissue mobilization  soft tissue work through Lt shoulder girdle     Passive ROM  PROM Lt shoulder in flexion, scaption; IR; ER(rotation in scaption              PT Education - 11/08/17 0849    Education Details  HEP     Person(s) Educated  Patient    Methods  Explanation;Demonstration;Tactile cues;Verbal cues;Handout    Comprehension  Verbalized understanding;Returned demonstration;Verbal cues required;Tactile cues required          PT Long Term Goals - 10/29/17 1553      PT LONG TERM GOAL #1   Title  Decrease pain in the Rt posterior hip by 50-75% allowing patient to participate in functional activities with minimal dysfunction or limitations 11/20/17    Time  6    Period  Weeks    Status  Partially Met   50% reduction      PT LONG TERM GOAL #2   Title  Improve hip mobility Rt to equal Lt 11/20/17    Time  6    Period  Weeks    Status  On-going      PT LONG TERM GOAL #3   Title  Independent in HEP 11/20/17    Time  6    Period  Weeks    Status  On-going      PT LONG TERM GOAL #4   Title  Improve/maintain FOTO to </= 40% limitation 11/20/17    Time  6    Period  Weeks    Status  On-going      PT LONG TERM GOAL #5   Title  improve Lt shoulder ROM to WFL's and within 5-10 deg of AROM Rt shoulder 11/26/17    Time  6    Period  Weeks    Status  On-going      PT LONG TERM GOAL #6   Title  Increase strength Lt UE to 4/5 to 4+/5  throughout 11/26/17    Time  6    Period  Weeks    Status  On-going      PT LONG TERM GOAL #7   Title  Patient to return to normal functional activities using Lt UE 11/26/17    Time  6    Period  Weeks    Status  On-going            Plan - 11/08/17 0814    Clinical Impression Statement  RTD 11/14/17 - Less  exercises tolerance today. Patient was at [facility] for several hours yesterday and had to do a lot of sit to stand; standing; sitting; etc. Patient with varied c/o of pain today. Tolerated less resistive exercise. Added shoulder exercises. Progressing gradually toward rehab goals.     Rehab Potential  Good    PT Frequency  2x / week    PT Duration  6 weeks    PT Next Visit Plan  Progress Lt shoulder rehab per TSA protocol.  continue core/LE strengthening.     Consulted and Agree with Plan of Care  Patient  Patient will benefit from skilled therapeutic intervention in order to improve the following deficits and impairments:  Postural dysfunction, Improper body mechanics, Pain, Increased fascial restricitons, Increased muscle spasms, Decreased mobility, Decreased range of motion, Decreased activity tolerance  Visit Diagnosis: Chronic left shoulder pain  Muscle weakness (generalized)  Localized edema  Pain in right hip  Other symptoms and signs involving the musculoskeletal system     Problem List Patient Active Problem List   Diagnosis Date Noted  . 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  . IFG (impaired fasting glucose) 08/03/2017  . Primary osteoarthritis of both knees 07/29/2017  . Osteoarthritis, localized, shoulder, left 07/29/2017  . Sinusitis, chronic   . Asthma 01/08/2012  . Dyslipidemia   . ADD (attention deficit disorder)   . Depression   . Osteopenia   . Incisional hernia   . Stress incontinence, female     Everardo All PT, MPH  11/08/2017, 9:27 AM  [facility] [place], [place], [address] Phone: [phone]   Fax:  [phone]  Name: [name] MRN: [id number] Date of Birth: 1950/07/28

## 2017-11-08 NOTE — Telephone Encounter (Signed)
Error

## 2017-11-13 ENCOUNTER — Ambulatory Visit (INDEPENDENT_AMBULATORY_CARE_PROVIDER_SITE_OTHER): Payer: BC Managed Care – PPO | Admitting: Rehabilitative and Restorative Service Providers"

## 2017-11-13 ENCOUNTER — Ambulatory Visit: Payer: BC Managed Care – PPO | Admitting: Podiatry

## 2017-11-13 ENCOUNTER — Ambulatory Visit (INDEPENDENT_AMBULATORY_CARE_PROVIDER_SITE_OTHER): Payer: BC Managed Care – PPO | Admitting: Family Medicine

## 2017-11-13 ENCOUNTER — Encounter: Payer: Self-pay | Admitting: Family Medicine

## 2017-11-13 ENCOUNTER — Encounter: Payer: Self-pay | Admitting: Rehabilitative and Restorative Service Providers"

## 2017-11-13 VITALS — BP 139/78 | HR 87 | Ht 62.99 in | Wt 192.0 lb

## 2017-11-13 DIAGNOSIS — D649 Anemia, unspecified: Secondary | ICD-10-CM

## 2017-11-13 DIAGNOSIS — K449 Diaphragmatic hernia without obstruction or gangrene: Secondary | ICD-10-CM

## 2017-11-13 DIAGNOSIS — M25551 Pain in right hip: Secondary | ICD-10-CM

## 2017-11-13 DIAGNOSIS — R600 Localized edema: Secondary | ICD-10-CM | POA: Diagnosis not present

## 2017-11-13 DIAGNOSIS — Z8711 Personal history of peptic ulcer disease: Secondary | ICD-10-CM | POA: Diagnosis not present

## 2017-11-13 DIAGNOSIS — M25512 Pain in left shoulder: Secondary | ICD-10-CM | POA: Diagnosis not present

## 2017-11-13 DIAGNOSIS — M6281 Muscle weakness (generalized): Secondary | ICD-10-CM

## 2017-11-13 DIAGNOSIS — R29898 Other symptoms and signs involving the musculoskeletal system: Secondary | ICD-10-CM

## 2017-11-13 DIAGNOSIS — K2100 Gastro-esophageal reflux disease with esophagitis, without bleeding: Secondary | ICD-10-CM

## 2017-11-13 DIAGNOSIS — Z23 Encounter for immunization: Secondary | ICD-10-CM | POA: Diagnosis not present

## 2017-11-13 DIAGNOSIS — M7918 Myalgia, other site: Secondary | ICD-10-CM | POA: Insufficient documentation

## 2017-11-13 DIAGNOSIS — G8929 Other chronic pain: Secondary | ICD-10-CM

## 2017-11-13 LAB — BASIC METABOLIC PANEL WITH GFR
BUN: 20 mg/dL (ref 7–25)
CO2: 24 mmol/L (ref 20–32)
CREATININE: 0.8 mg/dL (ref 0.50–0.99)
Calcium: 9.7 mg/dL (ref 8.6–10.4)
Chloride: 101 mmol/L (ref 98–110)
GFR, Est African American: 88 mL/min/{1.73_m2} (ref >=60)
GFR, Est Non African American: 76 mL/min/{1.73_m2} (ref >=60)
Glucose, Bld: 118 mg/dL — ABNORMAL HIGH (ref 65–99)
Potassium: 4.8 mmol/L (ref 3.5–5.3)
Sodium: 138 mmol/L (ref 135–146)

## 2017-11-13 LAB — CBC
HEMATOCRIT: 41.7 % (ref 35.0–45.0)
HEMOGLOBIN: 13.7 g/dL (ref 11.7–15.5)
MCH: 27.4 pg (ref 27.0–33.0)
MCHC: 32.9 g/dL (ref 32.0–36.0)
MCV: 83.4 fL (ref 80.0–100.0)
MPV: 10.5 fL (ref 7.5–12.5)
Platelets: 418 10*3/uL — ABNORMAL HIGH (ref 140–400)
RBC: 5 10*6/uL (ref 3.80–5.10)
RDW: 13.6 % (ref 11.0–15.0)
WBC: 7.4 10*3/uL (ref 3.8–10.8)

## 2017-11-13 NOTE — Progress Notes (Addendum)
Subjective:    Patient ID: [name], female    DOB: 27-Dec-1950, 66 y.o.   MRN: [id number]  HPI 67 yo female recently had left shoulder surgery.  She is wanting to be referred for endoscopy and colonoscopy.  She had an endoscopy back in 2001 and a colonoscopy back in 2008 back in [place].  She does a prior history of peptic ulcers.  It was felt to be caused by the use of Celebrex at that time.    She is currently taking prescription strength Nexium that she started in April. Her BMs have become more irregular and are more like pellets.   She also has a history of abdominal wall hernia seen on CT in 2015.  For pain control her Cymbalta was increased from 60 mg in the morning to adding an additional 30 mg at bedtime.  She wants to know how to taper back down.  She is also been doing physical therapy for piriformis syndrome.  She is been taking lots of Tylenol to help with her pain as well as muscle relaxers.  Asthma -She is doing okay overall.  She has had to use her inhaler here there for some occasional wheezing.  She also gets some drainage and postnasal drip in her nose.  He did note that right after her shoulder surgery she felt like she was hearing lungs/breathing noises in her abdomen.  She was not sure if this was related to the hiatal hernia or not.  The nurse who is helping to take care of her said she could hear it as well.  Review of Systems  BP 139/78   Pulse 87   Ht 5' 2.99" (1.6 m)   Wt 192 lb (87.1 kg)   LMP 08/30/2008   SpO2 98%   BMI 34.02 kg/m     Allergies  Allergen Reactions  . Aspirin Other (See Comments)    GI Intolerance; History of ulcers; GI BLEED  . Bee Venom Swelling    Any insects that bite or stings- swelling at site of sting or cellulitis can develop  . Nsaids Other (See Comments)    History of ulcers    Past Medical History:  Diagnosis Date  . ADD (attention deficit disorder)   . Arthritis    "bunion on  right" (08/11/2014)  . Chronic  bronchitis ([place])    "plenty; but not q yr" (08/11/2014)  . Depression   . Dyslipidemia   . Environmental allergies   . GERD (gastroesophageal reflux disease)   . Hearing difficulty of left ear    "grew up w/deaf parent; find myself lip reading more recently" (08/11/2014)  . Hepatitis    "exposed in college; rec'd gamma globulin; 6 months later S/S (weakness, lethargy, fevers); ; dx'd infectious hepatitis"  . History of herpes genitalis    "stress induced reoccurance that shows up on my left middle finger" (08/11/2014)  . History of hiatal hernia   . History of stomach ulcers   . Hypercholesterolemia   . Hyperthyroidism 1994   "postpartum only; resolved itself"  . Incisional hernia   . Internal hemorrhoids   . Leg cramping   . Osteopenia   . Seasonal asthma   . Stress incontinence, female   . Ulcer     Past Surgical History:  Procedure Laterality Date  . CESAREAN SECTION  1994  . CHALAZION EXCISION Left ~ 2013  . DILATION AND CURETTAGE OF UTERUS  X 2  . EYE SURGERY    .  HERNIA REPAIR  08/11/2014  . INCISIONAL HERNIA REPAIR N/A 08/11/2014   Procedure: LAPAROSCOPIC INCISIONAL HERNIA REPAIR;  Surgeon: Coralie Keens, MD;  Location: [place];  Service: General;  Laterality: N/A;  . INCONTINENCE SURGERY  2005  . INGUINAL HERNIA REPAIR Left 08/11/2014  . INGUINAL HERNIA REPAIR Left 08/11/2014   Procedure: LEFT INGUINAL HERNIA REPAIR;  Surgeon: Coralie Keens, MD;  Location: [place];  Service: General;  Laterality: Left;  . INSERTION OF MESH Left 08/11/2014   Procedure: INSERTION OF MESH TO LEFT GROIN AND ABDOMEN;  Surgeon: Coralie Keens, MD;  Location: [place];  Service: General;  Laterality: Left;  . LAPAROSCOPIC INCISIONAL / UMBILICAL / VENTRAL HERNIA REPAIR  08/11/2014   [facility]  . NASAL POLYP EXCISION  2014  . REPAIR OF PERFORATED ULCER  1985  . TONSILLECTOMY    . TOTAL SHOULDER ARTHROPLASTY Left 09/21/2017   Procedure: LEFT TOTAL SHOULDER ARTHROPLASTY;  Surgeon: Nicholes Stairs,  MD;  Location: [facility] [facility];  Service: Orthopedics;  Laterality: Left;    Social History   Socioeconomic History  . Marital status: Divorced    Spouse name: Not on file  . Number of children: Not on file  . Years of education: Not on file  . Highest education level: Not on file  Occupational History  . Occupation: Consulting civil engineer    Comment: Full time; Barista  Social Needs  . Financial resource strain: Not on file  . Food insecurity:    Worry: Not on file    Inability: Not on file  . Transportation needs:    Medical: Not on file    Non-medical: Not on file  Tobacco Use  . Smoking status: Never Smoker  . Smokeless tobacco: Never Used  Substance and Sexual Activity  . Alcohol use: Yes    Alcohol/week: 2.0 standard drinks    Types: 1 Glasses of wine, 1 Shots of liquor per week    Comment: 08/11/2014 "0-2 glasses of wine or a mixed drink q wk"  . Drug use: No  . Sexual activity: Never    Birth control/protection: Post-menopausal  Lifestyle  . Physical activity:    Days per week: Not on file    Minutes per session: Not on file  . Stress: Not on file  Relationships  . Social connections:    Talks on phone: Not on file    Gets together: Not on file    Attends religious service: Not on file    Active member of club or organization: Not on file    Attends meetings of clubs or organizations: Not on file    Relationship status: Not on file  . Intimate partner violence:    Fear of current or ex partner: Not on file    Emotionally abused: Not on file    Physically abused: Not on file    Forced sexual activity: Not on file  Other Topics Concern  . Not on file  Social History Narrative   Marital status: divorced; not dating      Children: 1 daughter; no grandchildren      Lives:        Employment: Optometrist      Tobacco:       Alcohol:      Drugs:       Exercise:     Family History  Problem Relation Age of Onset  . Arthritis Mother   .  Heart disease Mother        CABG @  7  . Diabetes Mother   . Osteoporosis Mother   . Hyperlipidemia Mother   . Arthritis Father   . Lymphoma Father 14       chemo  . Cancer Father        lymphoma  . Lymphoma Paternal Grandmother   . Diabetes Maternal Grandmother     Outpatient Encounter Medications as of 11/13/2017  Medication Sig  . acetaminophen (TYLENOL) 650 MG CR tablet Take 1,300 mg by mouth See admin instructions. Take 1,300 mg by mouth at bedtime and may also take an additional 1,300 mg one to two times a day as needed for pain  . albuterol (PROAIR HFA) 108 (90 Base) MCG/ACT inhaler INHALE 2 PUFFS INTO THE LUNGS EVERY 6 (SIX) HOURS AS NEEDED *EMERGENCY FILL* (Patient taking differently: Inhale 2 puffs into the lungs every 6 (six) hours as needed for wheezing or shortness of breath. )  . amphetamine-dextroamphetamine (ADDERALL) 20 MG tablet Take 1 tablet (20 mg total) by mouth 2 (two) times daily. (Patient taking differently: Take 20 mg by mouth See admin instructions. Take 20 mg by mouth one to two times a day)  . cyclobenzaprine (FLEXERIL) 10 MG tablet Take 10 mg by mouth at bedtime as needed (for sleep or pain).   . DULoxetine (CYMBALTA) 60 MG capsule Take 1 capsule (60 mg total) by mouth every morning.  Marland Kitchen esomeprazole (NEXIUM) 40 MG capsule Take 1 capsule (40 mg total) by mouth daily at 12 noon. (Patient taking differently: Take 40 mg by mouth daily. )  . glucosamine-chondroitin 500-400 MG tablet Take 1 tablet by mouth 3 (three) times daily.  Marland Kitchen ipratropium (ATROVENT) 0.03 % nasal spray Place 2 sprays into the nose 4 (four) times daily. Use as needed for runny nose and congestion (Patient taking differently: Place 2 sprays into the nose 4 (four) times daily as needed (for a runny nose or congestion). )  . loratadine (CLARITIN) 10 MG tablet Take 1 tablet (10 mg total) by mouth daily.  . Methylsulfonylmethane (MSM PO) Take 1 capsule by mouth See admin instructions. Take 1 capsule by  mouth two to three times a day with food  . mometasone (NASONEX) 50 MCG/ACT nasal spray INHALE 2 SPRAYS INTO THE NOSE DAILY AS NEEDED (Patient taking differently: Place 2 sprays into the nose daily as needed (for inflammation). )  . Multiple Vitamins-Minerals (ONE-A-DAY WOMENS 50 PLUS) TABS Take 1 tablet by mouth daily.  . simvastatin (ZOCOR) 40 MG tablet Take 1 tablet (40 mg total) by mouth daily. (Patient taking differently: Take 40 mg by mouth at bedtime. )  . UNABLE TO FIND Bone Strength [place] calcium tablets: Take 3 tablets by mouth daily  . [DISCONTINUED] DULoxetine (CYMBALTA) 30 MG capsule Take 1 capsule (30 mg total) by mouth at bedtime.  . [DISCONTINUED] benzonatate (TESSALON) 100 MG capsule Take 100 mg by mouth every 8 (eight) hours as needed for cough.   . [DISCONTINUED] HYDROcodone-acetaminophen (NORCO) 10-325 MG tablet Take 1 tablet by mouth every 8 (eight) hours as needed. (Patient taking differently: Take 1 tablet by mouth every 8 (eight) hours as needed (for pain). )  . [DISCONTINUED] ondansetron (ZOFRAN) 4 MG tablet Take 4 mg by mouth every 8 (eight) hours as needed for nausea or vomiting.   . [DISCONTINUED] oxyCODONE (OXY IR/ROXICODONE) 5 MG immediate release tablet Take 1-2 tablets by mouth every 4 hours for moderate to severe pain.   No facility-administered encounter medications on file as of 11/13/2017.  Objective:   Physical Exam  Constitutional: She is oriented to person, place, and time. She appears well-developed and well-nourished.  HENT:  Head: Normocephalic and atraumatic.  Right Ear: External ear normal.  Left Ear: External ear normal.  Nose: Nose normal.  Mouth/Throat: Oropharynx is clear and moist.  TMs and canals are clear.   Eyes: Pupils are equal, round, and reactive to light. Conjunctivae and EOM are normal.  Neck: Neck supple. No thyromegaly present.  Cardiovascular: Normal rate, regular rhythm and normal heart sounds.  Pulmonary/Chest:  Effort normal and breath sounds normal. She has no wheezes.  Lymphadenopathy:    She has no cervical adenopathy.  Neurological: She is alert and oriented to person, place, and time.  Skin: Skin is warm and dry.  Psychiatric: She has a normal mood and affect.        Assessment & Plan:  Gerd/hx of PUD -for now we will continue with Nexium.  She is taking it almost every other day so just encouraged her to stick with every other day.  Her symptoms are improved on the Nexium but because of her history she would like to have an endoscopy and she is due for colonoscopy.  Discussed need for Shingles vaccine.    Chronic muscular skeletal pain-we will decrease her Cymbalta and drop off the evening dose of 30 mg.  Continue the 60 mg in the AM.     Status post left shoulder surgery-she has a follow-up coming up and hopefully will be able to get out of the sling.  Anemia s/p surgery-hemoglobin had dropped down to 10.4 after surgery on August 24.  Like to recheck a CBC just to make sure that her hemoglobin is rebounding.  She was also noted to have low calcium levels around surgeries I would like to recheck that as well.  Piriformis a syndrome-doing PT.  Asthma-lungs are clear on exam today.  She is using her albuterol as needed.  I am not sure what type of breathing noises she was hearing in her abdomen.  It is not typical of a hiatal hernia.  Often people can hear her stomach noises more so in her chest but usually not the other way around.  Just be that the noise was radiating from her chest.  She does have some chronic issues with postnasal drip and intermittent congestion from her allergies but has been using her allergy medication.

## 2017-11-13 NOTE — Therapy (Signed)
[facility] [place], [place], [address] Phone: [phone]   Fax:  [phone]  Physical Therapy Treatment  Patient Details  Name: ARCHER VISE MRN: [id number] Date of Birth: 29-Dec-1950 Referring Provider (PT): Dr Lynne Leader - hip; Dr Victorino December - shoulder    Encounter Date: 11/13/2017  PT End of Session - 11/13/17 0855    Visit Number  13    Number of Visits  24    Date for PT Re-Evaluation  11/23/17    PT Start Time  0805    PT Stop Time  0906    PT Time Calculation (min)  61 min    Activity Tolerance  Patient tolerated treatment well       Past Medical History:  Diagnosis Date  . ADD (attention deficit disorder)   . Arthritis    "bunion on  right" (08/11/2014)  . Chronic bronchitis ([place])    "plenty; but not q yr" (08/11/2014)  . Depression   . Dyslipidemia   . Environmental allergies   . GERD (gastroesophageal reflux disease)   . Hearing difficulty of left ear    "grew up w/deaf parent; find myself lip reading more recently" (08/11/2014)  . Hepatitis    "exposed in college; rec'd gamma globulin; 6 months later S/S (weakness, lethargy, fevers); ; dx'd infectious hepatitis"  . History of herpes genitalis    "stress induced reoccurance that shows up on my left middle finger" (08/11/2014)  . History of hiatal hernia   . History of stomach ulcers   . Hypercholesterolemia   . Hyperthyroidism 1994   "postpartum only; resolved itself"  . Incisional hernia   . Internal hemorrhoids   . Leg cramping   . Osteopenia   . Seasonal asthma   . Stress incontinence, female   . Ulcer     Past Surgical History:  Procedure Laterality Date  . CESAREAN SECTION  1994  . CHALAZION EXCISION Left ~ 2013  . DILATION AND CURETTAGE OF UTERUS  X 2  . EYE SURGERY    . HERNIA REPAIR  08/11/2014  . INCISIONAL HERNIA REPAIR N/A 08/11/2014   Procedure: LAPAROSCOPIC INCISIONAL HERNIA REPAIR;  Surgeon: Coralie Keens,  MD;  Location: [facility];  Service: General;  Laterality: N/A;  . INCONTINENCE SURGERY  2005  . INGUINAL HERNIA REPAIR Left 08/11/2014  . INGUINAL HERNIA REPAIR Left 08/11/2014   Procedure: LEFT INGUINAL HERNIA REPAIR;  Surgeon: Coralie Keens, MD;  Location: [place];  Service: General;  Laterality: Left;  . INSERTION OF MESH Left 08/11/2014   Procedure: INSERTION OF MESH TO LEFT GROIN AND ABDOMEN;  Surgeon: Coralie Keens, MD;  Location: [facility];  Service: General;  Laterality: Left;  . LAPAROSCOPIC INCISIONAL / UMBILICAL / VENTRAL HERNIA REPAIR  08/11/2014   IHR  . NASAL POLYP EXCISION  2014  . REPAIR OF PERFORATED ULCER  1985  . TONSILLECTOMY    . TOTAL SHOULDER ARTHROPLASTY Left 09/21/2017   Procedure: LEFT TOTAL SHOULDER ARTHROPLASTY;  Surgeon: Nicholes Stairs, MD;  Location: [facility];  Service: Orthopedics;  Laterality: Left;    There were no vitals filed for this visit.  Subjective Assessment - 11/13/17 0902    Subjective  No pain in the Lt shoulder. Has pain in the Rt shoulder; bilat knees; Rt hip; foot. Has appt for urinary problems and hearing. Concerned about swelling in the Lt elbow. Does not know of any injury to the Lt elbow.     Currently  in Pain?  No/denies    Pain Location  Hip    Pain Score  0    Pain Location  Shoulder         [facility] PT Assessment - 11/13/17 0001      Assessment   Medical Diagnosis  Rt posterior hip pain     Referring Provider (PT)  Dr Lynne Leader - hip; Dr Victorino December - shoulder     Onset Date/Surgical Date  08/13/17   Lt TSA 09/21/17   Hand Dominance  Right    Next MD Visit  Dr Victorino December 11/14/17      Precautions   Precaution Comments  Lt TSA       PROM   Left Shoulder Flexion  140 Degrees    Left Shoulder ABduction  136 Degrees   in scapular plane    Left Shoulder Internal Rotation  48 Degrees   in scapular plane   Left Shoulder External Rotation  30 Degrees   in scapular plane      Flexibility   Piriformis  improved mobility with  PT assist       Palpation   Palpation comment  muscular tightness Lt shoulder girdle; Rt posterior hip                    [facility] Adult PT Treatment/Exercise - 11/13/17 0001      Shoulder Exercises: Supine   Other Supine Exercises  AROM Lt shoulder w/ scapular stabilization 90 deg small range flex/ext; horiz ab/add; circles CW/CCW x 10 each       Shoulder Exercises: Standing   ABduction  AROM;Left;Right;10 reps   scaption to ~ 75-80 deg    Other Standing Exercises  scap squeeze 10 sec x 10; axial extension 10 sec x 5 w/ swim noodle     Other Standing Exercises  pendulum 30 CW/30 CCW       Shoulder Exercises: Isometric Strengthening   Flexion Limitations  10x5"    Extension Limitations  10x5"    External Rotation Limitations  10x5'    Internal Rotation Limitations  10x5"    ABduction Limitations  10x5'      Shoulder Exercises: Stretch   Internal Rotation Stretch  5 reps   standing with cane behind back    External Rotation Stretch  5 reps;10 seconds   standing with elbow at 90 deg using cane to stretch    Table Stretch - Flexion  5 reps;10 seconds      Electrical Stimulation   Electrical Stimulation Location  Lt shoulder girdle     Electrical Stimulation Action  IFC    Electrical Stimulation Parameters  to tolerance    Electrical Stimulation Goals  Pain;Tone      Vasopneumatic   Number Minutes Vasopneumatic   15 minutes    Vasopnuematic Location   Shoulder   Lt   Vasopneumatic Pressure  Low    Vasopneumatic Temperature   34 deg       Manual Therapy   Manual therapy comments  pt supine     Soft tissue mobilization  soft tissue work through Lt shoulder girdle     Passive ROM  PROM Lt shoulder in flexion, scaption; IR; ER(rotation in scaption                   PT Long Term Goals - 10/29/17 1553      PT LONG TERM GOAL #1   Title  Decrease pain in the  Rt posterior hip by 50-75% allowing patient to participate in functional activities with minimal  dysfunction or limitations 11/20/17    Time  6    Period  Weeks    Status  Partially Met   50% reduction      PT LONG TERM GOAL #2   Title  Improve hip mobility Rt to equal Lt 11/20/17    Time  6    Period  Weeks    Status  On-going      PT LONG TERM GOAL #3   Title  Independent in HEP 11/20/17    Time  6    Period  Weeks    Status  On-going      PT LONG TERM GOAL #4   Title  Improve/maintain FOTO to </= 40% limitation 11/20/17    Time  6    Period  Weeks    Status  On-going      PT LONG TERM GOAL #5   Title  improve Lt shoulder ROM to WFL's and within 5-10 deg of AROM Rt shoulder 11/26/17    Time  6    Period  Weeks    Status  On-going      PT LONG TERM GOAL #6   Title  Increase strength Lt UE to 4/5 to 4+/5  throughout 11/26/17    Time  6    Period  Weeks    Status  On-going      PT LONG TERM GOAL #7   Title  Patient to return to normal functional activities using Lt UE 11/26/17    Time  6    Period  Weeks    Status  On-going            Plan - 11/13/17 0855    Clinical Impression Statement  RTD 11/14/17 Patient continues to have various aches and pains in Lt shoulder and elbow. She has pain in the Rt shoulder; Rt hip; LB; knees; bilat feet. Patient is progressing gradually toward stated goals of therapy. Good gains in PROM and is beginning AROM within safe limits. Progressing per post op TSA protocol.     Rehab Potential  Good    PT Frequency  2x / week    PT Duration  6 weeks    PT Next Visit Plan  Progress Lt shoulder rehab per TSA protocol.  continue core/LE strengthening. Note to Dr Everardo Beals and Agree with Plan of Care  Patient       Patient will benefit from skilled therapeutic intervention in order to improve the following deficits and impairments:  Postural dysfunction, Improper body mechanics, Pain, Increased fascial restricitons, Increased muscle spasms, Decreased mobility, Decreased range of motion, Decreased activity  tolerance  Visit Diagnosis: Chronic left shoulder pain  Muscle weakness (generalized)  Localized edema  Pain in right hip  Other symptoms and signs involving the musculoskeletal system     Problem List Patient Active Problem List   Diagnosis Date Noted  . 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  . IFG (impaired fasting glucose) 08/03/2017  . Primary osteoarthritis of both knees 07/29/2017  . Osteoarthritis, localized, shoulder, left 07/29/2017  . Sinusitis, chronic   . Asthma 01/08/2012  . Dyslipidemia   . ADD (attention deficit disorder)   . Depression   . Osteopenia   . Incisional hernia   . Stress incontinence, female     Everardo All PT, MPH  11/13/2017, 9:34 AM  [facility] [place], [place], [address] Phone: [phone]   Fax:  [phone]  Name: ORAH SONNEN MRN: [id number] Date of Birth: 14-Apr-1950

## 2017-11-13 NOTE — Patient Instructions (Addendum)
Okay to discontinue the 30 mg of Cymbalta in the evening and just take with the 60 mg in the morning.

## 2017-11-15 ENCOUNTER — Ambulatory Visit (INDEPENDENT_AMBULATORY_CARE_PROVIDER_SITE_OTHER): Payer: BC Managed Care – PPO

## 2017-11-15 ENCOUNTER — Encounter: Payer: Self-pay | Admitting: Podiatry

## 2017-11-15 ENCOUNTER — Ambulatory Visit (INDEPENDENT_AMBULATORY_CARE_PROVIDER_SITE_OTHER): Payer: BC Managed Care – PPO | Admitting: Podiatry

## 2017-11-15 ENCOUNTER — Ambulatory Visit (INDEPENDENT_AMBULATORY_CARE_PROVIDER_SITE_OTHER): Payer: BC Managed Care – PPO | Admitting: Physical Therapy

## 2017-11-15 ENCOUNTER — Encounter: Payer: Self-pay | Admitting: Physical Therapy

## 2017-11-15 DIAGNOSIS — M25512 Pain in left shoulder: Secondary | ICD-10-CM | POA: Diagnosis not present

## 2017-11-15 DIAGNOSIS — R600 Localized edema: Secondary | ICD-10-CM | POA: Diagnosis not present

## 2017-11-15 DIAGNOSIS — M25551 Pain in right hip: Secondary | ICD-10-CM | POA: Diagnosis not present

## 2017-11-15 DIAGNOSIS — M6281 Muscle weakness (generalized): Secondary | ICD-10-CM

## 2017-11-15 DIAGNOSIS — M2041 Other hammer toe(s) (acquired), right foot: Secondary | ICD-10-CM | POA: Diagnosis not present

## 2017-11-15 DIAGNOSIS — M2042 Other hammer toe(s) (acquired), left foot: Secondary | ICD-10-CM | POA: Diagnosis not present

## 2017-11-15 DIAGNOSIS — G8929 Other chronic pain: Secondary | ICD-10-CM

## 2017-11-15 MED ORDER — HYDROCODONE-ACETAMINOPHEN 10-325 MG PO TABS: 1.0000 | ORAL_TABLET | Freq: Three times a day (TID) | ORAL | 0 refills | Status: DC | PRN

## 2017-11-15 NOTE — Progress Notes (Signed)
Subjective:   Patient ID: [name], female   DOB: 67 y.o.   MRN: [id number]   HPI Patient states her right foot feels slightly funny especially between the second and third toes and it does not been the way the left one does and she gets discomfort between the second and third toes and the second toe is elevated and she has a small amount of crust underneath the fifth toe right foot   ROS      Objective:  Physical Exam  Neurovascular status intact with patient found to have moderate elevation of the second digit right at the MPJ and distal contracture third digit right with rotation at the inner phalangeal joint underneath the second toe.  Small amount of crusted tissue noted right fifth digit     Assessment:  Hammertoe deformity with MPJ tightness of the second MPJ along with distal deformity third digit right and scar tissue right plantar fifth digit and also is noted to have arthritis the big toe joint right but it bends well and does not hurt     Plan:  H&P x-ray reviewed and we discussed options and she would like to try to lower the toe and straighten the third toe.  Explained there is [place] no guarantees ability get the second toe to come down and I made it very clear to her and I allowed her to read consent form for release of the MPJ and distal arthroplastic 3 right.  Patient wants surgery signed consent form understanding recovery can take several months and all risks as associated with this condition.  Patient scheduled for outpatient surgery and is encouraged to call with questions and I debrided plantar tissue today  X-ray indicated moderate elevation second digit right with the third digit coming under the second toe lifting it near

## 2017-11-15 NOTE — Patient Instructions (Signed)
Pre-Operative Instructions  Congratulations, you have decided to take an important step towards improving your quality of life.  You can be assured that the doctors and staff at [facility] will be with you every step of the way.  Here are some important things you should know:  1. Plan to be at the surgery center/hospital at least 1 (one) hour prior to your scheduled time, unless otherwise directed by the surgical center/hospital staff.  You must have a responsible adult accompany you, remain during the surgery and drive you home.  Make sure you have directions to the surgical center/hospital to ensure you arrive on time. 2. If you are having surgery at [facility] or [facility] hospitals, you will need a copy of your medical history and physical form from your family physician within one month prior to the date of surgery. We will give you a form for your primary physician to complete.  3. We make every effort to accommodate the date you request for surgery.  However, there are times where surgery dates or times have to be moved.  We will contact you as soon as possible if a change in schedule is required.   4. No aspirin/ibuprofen for one week before surgery.  If you are on aspirin, any non-steroidal anti-inflammatory medications (Mobic, Aleve, Ibuprofen) should not be taken seven (7) days prior to your surgery.  You make take Tylenol for pain prior to surgery.  5. Medications - If you are taking daily heart and blood pressure medications, seizure, reflux, allergy, asthma, anxiety, pain or diabetes medications, make sure you notify the surgery center/hospital before the day of surgery so they can tell you which medications you should take or avoid the day of surgery. 6. No food or drink after midnight the night before surgery unless directed otherwise by surgical center/hospital staff. 7. No alcoholic beverages 24-hours prior to surgery.  No smoking 24-hours prior or 24-hours after  surgery. 8. Wear loose pants or shorts. They should be loose enough to fit over bandages, boots, and casts. 9. Don't wear slip-on shoes. Sneakers are preferred. 10. Bring your boot with you to the surgery center/hospital.  Also bring crutches or a walker if your physician has prescribed it for you.  If you do not have this equipment, it will be provided for you after surgery. 11. If you have not been contacted by the surgery center/hospital by the day before your surgery, call to confirm the date and time of your surgery. 12. Leave-time from work may vary depending on the type of surgery you have.  Appropriate arrangements should be made prior to surgery with your employer. 13. Prescriptions will be provided immediately following surgery by your doctor.  Fill these as soon as possible after surgery and take the medication as directed. Pain medications will not be refilled on weekends and must be approved by the doctor. 14. Remove nail polish on the operative foot and avoid getting pedicures prior to surgery. 15. Wash the night before surgery.  The night before surgery wash the foot and leg well with water and the antibacterial soap provided. Be sure to pay special attention to beneath the toenails and in between the toes.  Wash for at least three (3) minutes. Rinse thoroughly with water and dry well with a towel.  Perform this wash unless told not to do so by your physician.  Enclosed: 1 Ice pack (please put in freezer the night before surgery)   1 Hibiclens skin cleaner     Pre-op instructions  If you have any questions regarding the instructions, please do not hesitate to call our office.  [place]: [address], [place], [place] [address] -- [phone]  [place]: [address] [place], [place] [address] -- [phone]  [place]: [address]  [place], [place] [address] -- [phone]  [place]: [address], [address], [place], [place] [address] -- [phone]  Website:  [email or web address] 

## 2017-11-15 NOTE — Therapy (Signed)
[place], [place], [address] Phone: [phone]   Fax:  [phone]  Physical Therapy Treatment  Patient Details  Name: [name] MRN: [id number] Date of Birth: 04/02/1950 Referring Provider (PT): Dr Lynne Leader - hip; Dr Victorino December - shoulder    Encounter Date: 11/15/2017  PT End of Session - 11/15/17 0806    Visit Number  14    Number of Visits  24    Date for PT Re-Evaluation  11/23/17    PT Start Time  0804    PT Stop Time  0901    PT Time Calculation (min)  57 min    Activity Tolerance  Patient tolerated treatment well    Behavior During Therapy  [facility] for tasks assessed/performed       Past Medical History:  Diagnosis Date  . ADD (attention deficit disorder)   . Arthritis    "bunion on  right" (08/11/2014)  . Chronic bronchitis ([place])    "plenty; but not q yr" (08/11/2014)  . Depression   . Dyslipidemia   . Environmental allergies   . GERD (gastroesophageal reflux disease)   . Hearing difficulty of left ear    "grew up w/deaf parent; find myself lip reading more recently" (08/11/2014)  . Hepatitis    "exposed in college; rec'd gamma globulin; 6 months later S/S (weakness, lethargy, fevers); ; dx'd infectious hepatitis"  . History of herpes genitalis    "stress induced reoccurance that shows up on my left middle finger" (08/11/2014)  . History of hiatal hernia   . History of stomach ulcers   . Hypercholesterolemia   . Hyperthyroidism 1994   "postpartum only; resolved itself"  . Incisional hernia   . Internal hemorrhoids   . Leg cramping   . Osteopenia   . Seasonal asthma   . Stress incontinence, female   . Ulcer     Past Surgical History:  Procedure Laterality Date  . CESAREAN SECTION  1994  . CHALAZION EXCISION Left ~ 2013  . DILATION AND CURETTAGE OF UTERUS  X 2  . EYE SURGERY    . HERNIA REPAIR  08/11/2014  . INCISIONAL HERNIA REPAIR N/A 08/11/2014   Procedure:  LAPAROSCOPIC INCISIONAL HERNIA REPAIR;  Surgeon: Coralie Keens, MD;  Location: [facility];  Service: General;  Laterality: N/A;  . INCONTINENCE SURGERY  2005  . INGUINAL HERNIA REPAIR Left 08/11/2014  . INGUINAL HERNIA REPAIR Left 08/11/2014   Procedure: LEFT INGUINAL HERNIA REPAIR;  Surgeon: Coralie Keens, MD;  Location: [facility];  Service: General;  Laterality: Left;  . INSERTION OF MESH Left 08/11/2014   Procedure: INSERTION OF MESH TO LEFT GROIN AND ABDOMEN;  Surgeon: Coralie Keens, MD;  Location: [facility];  Service: General;  Laterality: Left;  . LAPAROSCOPIC INCISIONAL / UMBILICAL / VENTRAL HERNIA REPAIR  08/11/2014   IHR  . NASAL POLYP EXCISION  2014  . REPAIR OF PERFORATED ULCER  1985  . TONSILLECTOMY    . TOTAL SHOULDER ARTHROPLASTY Left 09/21/2017   Procedure: LEFT TOTAL SHOULDER ARTHROPLASTY;  Surgeon: Nicholes Stairs, MD;  Location: [facility];  Service: Orthopedics;  Laterality: Left;    There were no vitals filed for this visit.  Subjective Assessment - 11/15/17 0807    Subjective  Pt reports she returned to Dr.  She states he was pleased with her progress.  She is no longer in sling, "I slept like a baby last night!".  She has been cleared to  return to work on 12/12, to be confirmed 12/6 at MD appt.      Currently in Pain?  Yes    Pain Score  3     Pain Location  Buttocks    Pain Orientation  Right    Pain Descriptors / Indicators  Sore    Aggravating Factors   rolling on to hip;  getting into car    Pain Relieving Factors  TENS         [facility] PT Assessment - 11/15/17 0001      Assessment   Medical Diagnosis  Rt posterior hip pain; Lt TSA     Referring Provider (PT)  Dr Lynne Leader - hip; Dr Victorino December - shoulder     Onset Date/Surgical Date  08/13/17   Lt TSA 09/21/17   Hand Dominance  Right    Next MD Visit  Dr Victorino December 01/04/18      Precautions   Precaution Comments  Lt TSA          [facility] Adult PT Treatment/Exercise - 11/15/17 0001      Exercises    Exercises  Shoulder;Knee/Hip      Lumbar Exercises: Supine   Bridge  10 reps;5 seconds   arms crossed against chest     Knee/Hip Exercises: Stretches   Passive Hamstring Stretch  Right;Left;2 reps;30 seconds   standing; leg on 13" step   Piriformis Stretch  Right;Left;2 reps      Shoulder Exercises: Supine   Flexion  AAROM;10 reps   cane   Flexion Limitations  bench press with cane x 10 reps      Shoulder Exercises: Standing   Internal Rotation  AAROM;Both;10 reps   cane behind back   Flexion  AROM;Left;10 reps   to ~80 deg; mirror for feedback   ABduction  AROM;Left;Right;10 reps   scaption to ~ 75-80 deg    Extension  AAROM;Both;10 reps   cane behind back, 5 sec hold     Shoulder Exercises: Stretch   External Rotation Stretch  5 reps;10 seconds   standing with elbow at 90 deg using cane to stretch    Other Shoulder Stretches  table stretch, flexion. Arms on table, sliding back on stool x 10 sec x 10 reps       Moist Heat Therapy   Number Minutes Moist Heat  15 Minutes    Moist Heat Location  Hip   Rt     Electrical Stimulation   Electrical Stimulation Location  Rt posterior hip    Electrical Stimulation Action  IFC    Electrical Stimulation Parameters  to pt tolerance    Electrical Stimulation Goals  Pain;Tone      Vasopneumatic   Number Minutes Vasopneumatic   15 minutes    Vasopnuematic Location   Shoulder   Lt   Vasopneumatic Pressure  Low    Vasopneumatic Temperature   34 deg                   PT Long Term Goals - 10/29/17 1553      PT LONG TERM GOAL #1   Title  Decrease pain in the Rt posterior hip by 50-75% allowing patient to participate in functional activities with minimal dysfunction or limitations 11/20/17    Time  6    Period  Weeks    Status  Partially Met   50% reduction      PT LONG TERM GOAL #2   Title  Improve  hip mobility Rt to equal Lt 11/20/17    Time  6    Period  Weeks    Status  On-going      PT LONG TERM GOAL #3    Title  Independent in HEP 11/20/17    Time  6    Period  Weeks    Status  On-going      PT LONG TERM GOAL #4   Title  Improve/maintain FOTO to </= 40% limitation 11/20/17    Time  6    Period  Weeks    Status  On-going      PT LONG TERM GOAL #5   Title  improve Lt shoulder ROM to WFL's and within 5-10 deg of AROM Rt shoulder 11/26/17    Time  6    Period  Weeks    Status  On-going      PT LONG TERM GOAL #6   Title  Increase strength Lt UE to 4/5 to 4+/5  throughout 11/26/17    Time  6    Period  Weeks    Status  On-going      PT LONG TERM GOAL #7   Title  Patient to return to normal functional activities using Lt UE 11/26/17    Time  6    Period  Weeks    Status  On-going            Plan - 11/15/17 0901    Clinical Impression Statement  Pt tolerated all exercises well, including AAROM with cane for shoulder.  No increase in pain in shoulder, nor hip with exercises.  Pt is progressing well per TSA protocol.     Rehab Potential  Good    PT Frequency  2x / week    PT Duration  6 weeks    PT Treatment/Interventions  Patient/family education;ADLs/Self Care Home Management;Cryotherapy;Electrical Stimulation;Iontophoresis 45m/ml Dexamethasone;Moist Heat;Ultrasound;Dry needling;Manual techniques;Neuromuscular re-education;Therapeutic activities;Therapeutic exercise    PT Next Visit Plan  Progress Lt shoulder rehab per TSA protocol.  continue core/LE strengthening.    Consulted and Agree with Plan of Care  Patient       Patient will benefit from skilled therapeutic intervention in order to improve the following deficits and impairments:  Postural dysfunction, Improper body mechanics, Pain, Increased fascial restricitons, Increased muscle spasms, Decreased mobility, Decreased range of motion, Decreased activity tolerance  Visit Diagnosis: Chronic left shoulder pain  Muscle weakness (generalized)  Localized edema  Pain in right hip     Problem List Patient Active  Problem List   Diagnosis Date Noted  . Chronic musculoskeletal pain 11/13/2017  . 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  . IFG (impaired fasting glucose) 08/03/2017  . Primary osteoarthritis of both knees 07/29/2017  . Osteoarthritis, localized, shoulder, left 07/29/2017  . Sinusitis, chronic   . Asthma 01/08/2012  . Dyslipidemia   . ADD (attention deficit disorder)   . Depression   . Osteopenia   . Incisional hernia   . Stress incontinence, female    JKerin Perna PDelaware10/17/19 9:13 AM  [facility] [address]Phone: [phone]  Fax:  [phone] Name: SELLIOTTE MARSALISMRN: [id number] of Birth: [birth year]

## 2017-11-19 ENCOUNTER — Ambulatory Visit (INDEPENDENT_AMBULATORY_CARE_PROVIDER_SITE_OTHER): Payer: BC Managed Care – PPO | Admitting: Physical Therapy

## 2017-11-19 VITALS — BP 120/77

## 2017-11-19 DIAGNOSIS — R29898 Other symptoms and signs involving the musculoskeletal system: Secondary | ICD-10-CM

## 2017-11-19 DIAGNOSIS — M25551 Pain in right hip: Secondary | ICD-10-CM | POA: Diagnosis not present

## 2017-11-19 DIAGNOSIS — M25512 Pain in left shoulder: Secondary | ICD-10-CM

## 2017-11-19 DIAGNOSIS — M6281 Muscle weakness (generalized): Secondary | ICD-10-CM

## 2017-11-19 DIAGNOSIS — R600 Localized edema: Secondary | ICD-10-CM

## 2017-11-19 DIAGNOSIS — G8929 Other chronic pain: Secondary | ICD-10-CM

## 2017-11-19 NOTE — Therapy (Signed)
[place], [place], [address] Phone: [phone]   Fax:  [phone]  Physical Therapy Treatment  Patient Details  Name: [name] MRN: [id number] Date of Birth: 09/20/1950 Referring Provider (PT): Dr Lynne Leader - hip; Dr Victorino December - shoulder    Encounter Date: 11/19/2017  PT End of Session - 11/19/17 1406    Visit Number  15    Number of Visits  24    Date for PT Re-Evaluation  11/23/17    PT Start Time  0850    PT Stop Time  1020    PT Time Calculation (min)  90 min    Activity Tolerance  Patient tolerated treatment well    Behavior During Therapy  [facility] for tasks assessed/performed       Past Medical History:  Diagnosis Date  . ADD (attention deficit disorder)   . Arthritis    "bunion on  right" (08/11/2014)  . Chronic bronchitis ([place])    "plenty; but not q yr" (08/11/2014)  . Depression   . Dyslipidemia   . Environmental allergies   . GERD (gastroesophageal reflux disease)   . Hearing difficulty of left ear    "grew up w/deaf parent; find myself lip reading more recently" (08/11/2014)  . Hepatitis    "exposed in college; rec'd gamma globulin; 6 months later S/S (weakness, lethargy, fevers); ; dx'd infectious hepatitis"  . History of herpes genitalis    "stress induced reoccurance that shows up on my left middle finger" (08/11/2014)  . History of hiatal hernia   . History of stomach ulcers   . Hypercholesterolemia   . Hyperthyroidism 1994   "postpartum only; resolved itself"  . Incisional hernia   . Internal hemorrhoids   . Leg cramping   . Osteopenia   . Seasonal asthma   . Stress incontinence, female   . Ulcer     Past Surgical History:  Procedure Laterality Date  . CESAREAN SECTION  1994  . CHALAZION EXCISION Left ~ 2013  . DILATION AND CURETTAGE OF UTERUS  X 2  . EYE SURGERY    . HERNIA REPAIR  08/11/2014  . INCISIONAL HERNIA REPAIR N/A 08/11/2014   Procedure:  LAPAROSCOPIC INCISIONAL HERNIA REPAIR;  Surgeon: Coralie Keens, MD;  Location: [place];  Service: General;  Laterality: N/A;  . INCONTINENCE SURGERY  2005  . INGUINAL HERNIA REPAIR Left 08/11/2014  . INGUINAL HERNIA REPAIR Left 08/11/2014   Procedure: LEFT INGUINAL HERNIA REPAIR;  Surgeon: Coralie Keens, MD;  Location: [place];  Service: General;  Laterality: Left;  . INSERTION OF MESH Left 08/11/2014   Procedure: INSERTION OF MESH TO LEFT GROIN AND ABDOMEN;  Surgeon: Coralie Keens, MD;  Location: [place];  Service: General;  Laterality: Left;  . LAPAROSCOPIC INCISIONAL / UMBILICAL / VENTRAL HERNIA REPAIR  08/11/2014   IHR  . NASAL POLYP EXCISION  2014  . REPAIR OF PERFORATED ULCER  1985  . TONSILLECTOMY    . TOTAL SHOULDER ARTHROPLASTY Left 09/21/2017   Procedure: LEFT TOTAL SHOULDER ARTHROPLASTY;  Surgeon: Nicholes Stairs, MD;  Location: [place];  Service: Orthopedics;  Laterality: Left;    Vitals:   11/19/17 1126  BP: 120/77    Subjective Assessment - 11/19/17 1127    Subjective  Pt reports her shoulder is doing good as far as pain and she can now wash her hair. She still has nagging pain in her hip.    Pain Score  3  Pain Location  Buttocks    Pain Orientation  Right    Pain Descriptors / Indicators  Sore    Pain Score  0    Pain Location  Shoulder    Pain Orientation  Left                       [facility] Adult PT Treatment/Exercise - 11/19/17 0001      Exercises   Exercises  Shoulder;Knee/Hip      Lumbar Exercises: Stretches   Piriformis Stretch  Right;4 reps;30 seconds   2 reps sitting, 2 reps supine with PT manual stretch     Lumbar Exercises: Supine   Bridge  15 reps;5 seconds      Knee/Hip Exercises: Stretches   Passive Hamstring Stretch  Right;Left;2 reps;30 seconds   1 rep Lt side     Shoulder Exercises: Supine   Flexion  AAROM;10 reps    Flexion Limitations  bench press with cane x 10 reps      Shoulder Exercises: Standing   Internal  Rotation  AAROM;Both;10 reps    Flexion  AROM;Left;10 reps    ABduction  AROM;Left;Right;10 reps    Extension  AAROM;Both;10 reps    Other Standing Exercises  pendulums X 20 ant-post, lateral, circles X 20 ea    Other Standing Exercises  standing P ball rolls up wedge incline from low mat table 5 sec hold X 10 each, flexion and then abduction      Shoulder Exercises: Pulleys   Flexion  2 minutes    Scaption  2 minutes      Shoulder Exercises: Stretch   External Rotation Stretch  5 reps;10 seconds      Modalities   Modalities  Electrical Stimulation;Cryotherapy;Vasopneumatic      Moist Heat Therapy   Number Minutes Moist Heat  --    Moist Heat Location  --      Cryotherapy   Number Minutes Cryotherapy  15 Minutes    Cryotherapy Location  Hip    Type of Cryotherapy  Ice pack      Electrical Stimulation   Electrical Stimulation Location  Rt posterior hip 15 min then Lt shoulder 15 min    Electrical Stimulation Action  IFC    Electrical Stimulation Parameters  tolerance    Electrical Stimulation Goals  Pain;Tone      Vasopneumatic   Number Minutes Vasopneumatic   15 minutes    Vasopnuematic Location   Shoulder    Vasopneumatic Pressure  Low    Vasopneumatic Temperature   34 deg       Manual Therapy   Soft tissue mobilization  STM Lt shoulder around incision                  PT Long Term Goals - 10/29/17 1553      PT LONG TERM GOAL #1   Title  Decrease pain in the Rt posterior hip by 50-75% allowing patient to participate in functional activities with minimal dysfunction or limitations 11/20/17    Time  6    Period  Weeks    Status  Partially Met   50% reduction      PT LONG TERM GOAL #2   Title  Improve hip mobility Rt to equal Lt 11/20/17    Time  6    Period  Weeks    Status  On-going      PT LONG TERM GOAL #3   Title  Independent  in HEP 11/20/17    Time  6    Period  Weeks    Status  On-going      PT LONG TERM GOAL #4   Title   Improve/maintain FOTO to </= 40% limitation 11/20/17    Time  6    Period  Weeks    Status  On-going      PT LONG TERM GOAL #5   Title  improve Lt shoulder ROM to WFL's and within 5-10 deg of AROM Rt shoulder 11/26/17    Time  6    Period  Weeks    Status  On-going      PT LONG TERM GOAL #6   Title  Increase strength Lt UE to 4/5 to 4+/5  throughout 11/26/17    Time  6    Period  Weeks    Status  On-going      PT LONG TERM GOAL #7   Title  Patient to return to normal functional activities using Lt UE 11/26/17    Time  6    Period  Weeks    Status  On-going            Plan - 11/19/17 1408    Clinical Impression Statement  90 minute session today to incorporate Rt hip and Lt shoulder. She was able to progress her therex some today with reps and adding in more shoulder AAROM within protocol. She had good tolerance to sesison today and stayed in pain free ROM. She was treated with one round of ice and TENS for Rt hip followed by another round of vaso and TENS to Lt shoulder all to decrease pain, swelling, inflammaiton and post exercise soreness. Pt is making good progress thus far and showed 135 deg AAROM for flexion on pulleys. PT will continue to progress as able per protocol.     Rehab Potential  Good    PT Frequency  2x / week    PT Duration  6 weeks    PT Treatment/Interventions  Patient/family education;ADLs/Self Care Home Management;Cryotherapy;Electrical Stimulation;Iontophoresis 31m/ml Dexamethasone;Moist Heat;Ultrasound;Dry needling;Manual techniques;Neuromuscular re-education;Therapeutic activities;Therapeutic exercise    PT Next Visit Plan  Progress Lt shoulder rehab per TSA protocol.  continue core/LE strengthening.    Consulted and Agree with Plan of Care  Patient       Patient will benefit from skilled therapeutic intervention in order to improve the following deficits and impairments:  Postural dysfunction, Improper body mechanics, Pain, Increased fascial  restricitons, Increased muscle spasms, Decreased mobility, Decreased range of motion, Decreased activity tolerance  Visit Diagnosis: Chronic left shoulder pain  Muscle weakness (generalized)  Localized edema  Pain in right hip  Other symptoms and signs involving the musculoskeletal system     Problem List Patient Active Problem List   Diagnosis Date Noted  . Chronic musculoskeletal pain 11/13/2017  . 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  . IFG (impaired fasting glucose) 08/03/2017  . Primary osteoarthritis of both knees 07/29/2017  . Osteoarthritis, localized, shoulder, left 07/29/2017  . Sinusitis, chronic   . Asthma 01/08/2012  . Dyslipidemia   . ADD (attention deficit disorder)   . Depression   . Osteopenia   . Incisional hernia   . Stress incontinence, female     BDebbe Odea PT, DPT 11/19/2017, 2:18 PM  [facility] [address]Phone: [phone]  Fax:  [phone] Name: [name]: [id number] of Birth:  [birth year]

## 2017-11-21 ENCOUNTER — Telehealth: Payer: Self-pay | Admitting: *Deleted

## 2017-11-21 NOTE — Telephone Encounter (Addendum)
"  I'm concerned about having the tendon release.  I'm concerned I might be inviting a problem.  I might just want to whittle it down to just the Hammer Toe procedure.  I am just worried about doing surgery again on a toe that I have already had surgery on.  It could cause more problems for me than I am having now.  Is there any way possible that Dr. Paulla Dolly can call me back?"  I will give him your message.  He may answer your questions and have me relay them to you.  "That's fine.  I'd also like to reschedule my surgery to the first Tuesday in December.  I want to wait until after Thanksgiving.  I don't want to be in a boot again during Thanksgiving."  He can do it on December 3.  "Will you go ahead and change that date for me in the meantime?"  Yes, I will get your surgery rescheduled.  I called Caren Griffins and rescheduled the surgery from 11/27/2017 to 01/01/2018.

## 2017-11-22 ENCOUNTER — Encounter: Payer: BC Managed Care – PPO | Admitting: Rehabilitative and Restorative Service Providers"

## 2017-11-23 ENCOUNTER — Ambulatory Visit (INDEPENDENT_AMBULATORY_CARE_PROVIDER_SITE_OTHER): Payer: BC Managed Care – PPO | Admitting: Rehabilitative and Restorative Service Providers"

## 2017-11-23 ENCOUNTER — Encounter: Payer: Self-pay | Admitting: Rehabilitative and Restorative Service Providers"

## 2017-11-23 DIAGNOSIS — R600 Localized edema: Secondary | ICD-10-CM

## 2017-11-23 DIAGNOSIS — M25551 Pain in right hip: Secondary | ICD-10-CM | POA: Diagnosis not present

## 2017-11-23 DIAGNOSIS — R29898 Other symptoms and signs involving the musculoskeletal system: Secondary | ICD-10-CM

## 2017-11-23 DIAGNOSIS — M25512 Pain in left shoulder: Secondary | ICD-10-CM | POA: Diagnosis not present

## 2017-11-23 DIAGNOSIS — G8929 Other chronic pain: Secondary | ICD-10-CM

## 2017-11-23 DIAGNOSIS — M6281 Muscle weakness (generalized): Secondary | ICD-10-CM

## 2017-11-23 NOTE — Therapy (Signed)
[place], [place], [address] Phone: [phone]   Fax:  [phone]  Physical Therapy Treatment  Patient Details  Name: [name] MRN: [id number] Date of Birth: April 12, 1950 Referring Provider (PT): Dr Lynne Leader - hip; Dr Victorino December - shoulder    Encounter Date: 11/23/2017  PT End of Session - 11/23/17 1114    Visit Number  16    Number of Visits  24    Date for PT Re-Evaluation  11/23/17    PT Start Time  1116    PT Stop Time  1232    PT Time Calculation (min)  76 min    Activity Tolerance  Patient tolerated treatment well       Past Medical History:  Diagnosis Date  . ADD (attention deficit disorder)   . Arthritis    "bunion on  right" (08/11/2014)  . Chronic bronchitis ([place])    "plenty; but not q yr" (08/11/2014)  . Depression   . Dyslipidemia   . Environmental allergies   . GERD (gastroesophageal reflux disease)   . Hearing difficulty of left ear    "grew up w/deaf parent; find myself lip reading more recently" (08/11/2014)  . Hepatitis    "exposed in college; rec'd gamma globulin; 6 months later S/S (weakness, lethargy, fevers); ; dx'd infectious hepatitis"  . History of herpes genitalis    "stress induced reoccurance that shows up on my left middle finger" (08/11/2014)  . History of hiatal hernia   . History of stomach ulcers   . Hypercholesterolemia   . Hyperthyroidism 1994   "postpartum only; resolved itself"  . Incisional hernia   . Internal hemorrhoids   . Leg cramping   . Osteopenia   . Seasonal asthma   . Stress incontinence, female   . Ulcer     Past Surgical History:  Procedure Laterality Date  . CESAREAN SECTION  1994  . CHALAZION EXCISION Left ~ 2013  . DILATION AND CURETTAGE OF UTERUS  X 2  . EYE SURGERY    . HERNIA REPAIR  08/11/2014  . INCISIONAL HERNIA REPAIR N/A 08/11/2014   Procedure: LAPAROSCOPIC INCISIONAL HERNIA REPAIR;  Surgeon: Coralie Keens,  MD;  Location: [facility];  Service: General;  Laterality: N/A;  . INCONTINENCE SURGERY  2005  . INGUINAL HERNIA REPAIR Left 08/11/2014  . INGUINAL HERNIA REPAIR Left 08/11/2014   Procedure: LEFT INGUINAL HERNIA REPAIR;  Surgeon: Coralie Keens, MD;  Location: [place];  Service: General;  Laterality: Left;  . INSERTION OF MESH Left 08/11/2014   Procedure: INSERTION OF MESH TO LEFT GROIN AND ABDOMEN;  Surgeon: Coralie Keens, MD;  Location: [place];  Service: General;  Laterality: Left;  . LAPAROSCOPIC INCISIONAL / UMBILICAL / VENTRAL HERNIA REPAIR  08/11/2014   IHR  . NASAL POLYP EXCISION  2014  . REPAIR OF PERFORATED ULCER  1985  . TONSILLECTOMY    . TOTAL SHOULDER ARTHROPLASTY Left 09/21/2017   Procedure: LEFT TOTAL SHOULDER ARTHROPLASTY;  Surgeon: Nicholes Stairs, MD;  Location: [place];  Service: Orthopedics;  Laterality: Left;    There were no vitals filed for this visit.  Subjective Assessment - 11/23/17 1118    Subjective  Was going to have surgery in her Rt foot/toe next week but has decided to cancel that surgery. Shoulder is doing well - still having some nagging pain in the Rt hip when she turns over in bed.     Currently in Pain?  Yes    Pain Score  1     Pain Location  Buttocks    Pain Orientation  Right    Pain Score  0    Pain Location  Shoulder    Pain Orientation  Left         [facility] PT Assessment - 11/23/17 0001      Assessment   Medical Diagnosis  Rt posterior hip pain; Lt TSA     Referring Provider (PT)  Dr Lynne Leader - hip; Dr Victorino December - shoulder     Onset Date/Surgical Date  08/13/17   Lt TSA 09/21/17   Hand Dominance  Right    Next MD Visit  Dr Victorino December 01/04/18      Precautions   Precaution Comments  Lt TSA       AROM   Right Shoulder Extension  60 Degrees    Right Shoulder Flexion  153 Degrees    Right Shoulder ABduction  161 Degrees    Right Shoulder Internal Rotation  28 Degrees    Right Shoulder External Rotation  90 Degrees    Left  Shoulder Extension  36 Degrees    Left Shoulder Flexion  123 Degrees   some elevation of scapula noted on Lt    Left Shoulder ABduction  74 Degrees   in scapular plane - soe abnormal movement patterns      PROM   Left Shoulder Flexion  143 Degrees    Left Shoulder ABduction  138 Degrees   in scapular plane   Left Shoulder Internal Rotation  51 Degrees   in scapular plane    Left Shoulder External Rotation  30 Degrees   in scapular plane      Palpation   Palpation comment  muscular tightness Lt shoulder girdle; Rt posterior hip                    [facility] Adult PT Treatment/Exercise - 11/23/17 0001      Lumbar Exercises: Stretches   Piriformis Stretch  Right;4 reps;30 seconds   supine with PT manual stretch     Lumbar Exercises: Aerobic   Nustep  L5 x 6.5 min LE only       Shoulder Exercises: Supine   Flexion  AAROM;10 reps    Flexion Limitations  bench press x 10 reps    Other Supine Exercises  scapular squeeze with elbows extended at sides 10 sec x 10     Other Supine Exercises  AROM Lt shoulder w/ scapular stabilization 90 deg small range flex/ext; horiz ab/add; circles CW/CCW x 10 each (increased range slightly)      Shoulder Exercises: Standing   Internal Rotation  AAROM;Both;10 reps   assisting Lt IR with Rt UE    Flexion  AROM;Left;15 reps   focus on scapular control    ABduction  AROM;Left;Right;10 reps;20 reps   in scapular plane    Extension  AAROM;Both;10 reps   with cane      Shoulder Exercises: Pulleys   Flexion  --   10 sec hold x 10 reps    Scaption  --   10 sec hold x 8 reps      Cryotherapy   Number Minutes Cryotherapy  15 Minutes    Cryotherapy Location  Hip   Rt   Type of Cryotherapy  Ice pack      Electrical Stimulation   Electrical Stimulation Location  Rt posterior hip 15 min then Lt shoulder  15 min    Electrical Stimulation Action  IFC    Electrical Stimulation Parameters  to tolerance    Electrical Stimulation Goals   Pain;Tone      Vasopneumatic   Number Minutes Vasopneumatic   15 minutes    Vasopnuematic Location   Shoulder    Vasopneumatic Pressure  Low    Vasopneumatic Temperature   34 deg       Manual Therapy   Manual therapy comments  pt supine     Soft tissue mobilization  working through the ant/superior/posterior shoulder girdle musculatrue     Myofascial Release  Lt anterior shoulder     Passive ROM  PROM Lt shoulder in flexion, scaption; IR; ER(rotation in scaption                   PT Long Term Goals - 10/29/17 1553      PT LONG TERM GOAL #1   Title  Decrease pain in the Rt posterior hip by 50-75% allowing patient to participate in functional activities with minimal dysfunction or limitations 11/20/17    Time  6    Period  Weeks    Status  Partially Met   50% reduction      PT LONG TERM GOAL #2   Title  Improve hip mobility Rt to equal Lt 11/20/17    Time  6    Period  Weeks    Status  On-going      PT LONG TERM GOAL #3   Title  Independent in HEP 11/20/17    Time  6    Period  Weeks    Status  On-going      PT LONG TERM GOAL #4   Title  Improve/maintain FOTO to </= 40% limitation 11/20/17    Time  6    Period  Weeks    Status  On-going      PT LONG TERM GOAL #5   Title  improve Lt shoulder ROM to WFL's and within 5-10 deg of AROM Rt shoulder 11/26/17    Time  6    Period  Weeks    Status  On-going      PT LONG TERM GOAL #6   Title  Increase strength Lt UE to 4/5 to 4+/5  throughout 11/26/17    Time  6    Period  Weeks    Status  On-going      PT LONG TERM GOAL #7   Title  Patient to return to normal functional activities using Lt UE 11/26/17    Time  6    Period  Weeks    Status  On-going            Plan - 11/23/17 1117    Clinical Impression Statement  Continued progress with shoulder A/PROM. Progressing well with rehab per protocol. Rt posterior hip is intermittent - note increase in tissue extensibility with passive piriformis stretch.  patient is gaining independence and progressing well toward stated goals of rehab.     Rehab Potential  Good    PT Frequency  2x / week    PT Duration  6 weeks    PT Treatment/Interventions  Patient/family education;ADLs/Self Care Home Management;Cryotherapy;Electrical Stimulation;Iontophoresis 81m/ml Dexamethasone;Moist Heat;Ultrasound;Dry needling;Manual techniques;Neuromuscular re-education;Therapeutic activities;Therapeutic exercise    PT Next Visit Plan  Progress Lt shoulder rehab per TSA protocol.  continue core/LE strengthening.    Consulted and Agree with Plan of Care  Patient  Patient will benefit from skilled therapeutic intervention in order to improve the following deficits and impairments:  Postural dysfunction, Improper body mechanics, Pain, Increased fascial restricitons, Increased muscle spasms, Decreased mobility, Decreased range of motion, Decreased activity tolerance  Visit Diagnosis: Chronic left shoulder pain  Muscle weakness (generalized)  Localized edema  Pain in right hip  Other symptoms and signs involving the musculoskeletal system     Problem List Patient Active Problem List   Diagnosis Date Noted  . Chronic musculoskeletal pain 11/13/2017  . 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  . IFG (impaired fasting glucose) 08/03/2017  . Primary osteoarthritis of both knees 07/29/2017  . Osteoarthritis, localized, shoulder, left 07/29/2017  . Sinusitis, chronic   . Asthma 01/08/2012  . Dyslipidemia   . ADD (attention deficit disorder)   . Depression   . Osteopenia   . Incisional hernia   . Stress incontinence, female     Everardo All PT, MPH  11/23/2017, 12:27 PM  [facility] [place], [place], [address] Phone: [phone]   Fax:  [phone]  Name: [name] MRN: [id number] Date of Birth: 1951/01/23

## 2017-11-26 ENCOUNTER — Encounter: Payer: Self-pay | Admitting: Physical Therapy

## 2017-11-26 ENCOUNTER — Ambulatory Visit (INDEPENDENT_AMBULATORY_CARE_PROVIDER_SITE_OTHER): Payer: BC Managed Care – PPO | Admitting: Physical Therapy

## 2017-11-26 DIAGNOSIS — R29898 Other symptoms and signs involving the musculoskeletal system: Secondary | ICD-10-CM

## 2017-11-26 DIAGNOSIS — R600 Localized edema: Secondary | ICD-10-CM

## 2017-11-26 DIAGNOSIS — M25512 Pain in left shoulder: Secondary | ICD-10-CM | POA: Diagnosis not present

## 2017-11-26 DIAGNOSIS — M25551 Pain in right hip: Secondary | ICD-10-CM

## 2017-11-26 DIAGNOSIS — M6281 Muscle weakness (generalized): Secondary | ICD-10-CM

## 2017-11-26 DIAGNOSIS — G8929 Other chronic pain: Secondary | ICD-10-CM

## 2017-11-26 NOTE — Addendum Note (Signed)
Addended by: Everardo All on: 11/26/2017 05:21 PM   Modules accepted: Orders

## 2017-11-27 NOTE — Therapy (Signed)
[place], [place], [address] Phone: [phone]   Fax:  [phone]  Physical Therapy Treatment  Patient Details  Name: [name] MRN: [id number] Date of Birth: [birth year] Referring Provider (PT): Dr Lynne Leader - hip; Dr Victorino December - shoulder    Encounter Date: 11/26/2017  PT End of Session - 11/26/17 1553    Visit Number  17    Number of Visits  24    Date for PT Re-Evaluation  01/04/18    PT Start Time  1522    PT Stop Time  1615    PT Time Calculation (min)  53 min    Activity Tolerance  Patient tolerated treatment well    Behavior During Therapy  [facility] for tasks assessed/performed       Past Medical History:  Diagnosis Date  . ADD (attention deficit disorder)   . Arthritis    "bunion on  right" (08/11/2014)  . Chronic bronchitis ([place])    "plenty; but not q yr" (08/11/2014)  . Depression   . Dyslipidemia   . Environmental allergies   . GERD (gastroesophageal reflux disease)   . Hearing difficulty of left ear    "grew up w/deaf parent; find myself lip reading more recently" (08/11/2014)  . Hepatitis    "exposed in college; rec'd gamma globulin; 6 months later S/S (weakness, lethargy, fevers); ; dx'd infectious hepatitis"  . History of herpes genitalis    "stress induced reoccurance that shows up on my left middle finger" (08/11/2014)  . History of hiatal hernia   . History of stomach ulcers   . Hypercholesterolemia   . Hyperthyroidism 1994   "postpartum only; resolved itself"  . Incisional hernia   . Internal hemorrhoids   . Leg cramping   . Osteopenia   . Seasonal asthma   . Stress incontinence, female   . Ulcer     Past Surgical History:  Procedure Laterality Date  . CESAREAN SECTION  1994  . CHALAZION EXCISION Left ~ 2013  . DILATION AND CURETTAGE OF UTERUS  X 2  . EYE SURGERY    . HERNIA REPAIR  08/11/2014  . INCISIONAL HERNIA REPAIR N/A 08/11/2014   Procedure:  LAPAROSCOPIC INCISIONAL HERNIA REPAIR;  Surgeon: Coralie Keens, MD;  Location: [facility];  Service: General;  Laterality: N/A;  . INCONTINENCE SURGERY  2005  . INGUINAL HERNIA REPAIR Left 08/11/2014  . INGUINAL HERNIA REPAIR Left 08/11/2014   Procedure: LEFT INGUINAL HERNIA REPAIR;  Surgeon: Coralie Keens, MD;  Location: [facility];  Service: General;  Laterality: Left;  . INSERTION OF MESH Left 08/11/2014   Procedure: INSERTION OF MESH TO LEFT GROIN AND ABDOMEN;  Surgeon: Coralie Keens, MD;  Location: [place];  Service: General;  Laterality: Left;  . LAPAROSCOPIC INCISIONAL / UMBILICAL / VENTRAL HERNIA REPAIR  08/11/2014   IHR  . NASAL POLYP EXCISION  2014  . REPAIR OF PERFORATED ULCER  1985  . TONSILLECTOMY    . TOTAL SHOULDER ARTHROPLASTY Left 09/21/2017   Procedure: LEFT TOTAL SHOULDER ARTHROPLASTY;  Surgeon: Nicholes Stairs, MD;  Location: [facility];  Service: Orthopedics;  Laterality: Left;    There were no vitals filed for this visit.  Subjective Assessment - 11/26/17 1525    Subjective  has been having more leg pain after waking up in the morning taking about an hour for pain to improve.    Pertinent History  Lt shoulder surgery for TSA 09/21/17; removal Lt little  toe phalanx 7/19; bunionectomy 11/16; ingunial and lap hernia repair 6/16; sling proceedure for bladder; sinus surgery; C-section; ulcer repair 1985     Patient Stated Goals  get rid of the hip pain; get shoulder working again                        [facility] Adult PT Treatment/Exercise - 11/26/17 1526      Exercises   Exercises  Shoulder      Lumbar Exercises: Aerobic   Nustep  L5 x 5 min LE only       Shoulder Exercises: Supine   Flexion  AAROM;20 reps    Flexion Limitations  bench press x 20 reps      Shoulder Exercises: Standing   Internal Rotation  AAROM;Both;20 reps   assisting Lt IR with Rt UE    Flexion  AROM;Left;15 reps   focus on scapular control    ABduction  AROM;Left;Right;10 reps;20 reps    in scapular plane    Extension  AAROM;Both;20 reps   with cane    Retraction  Both;15 reps;Theraband    Theraband Level (Shoulder Retraction)  Level 2 (Red)      Electrical Stimulation   Electrical Stimulation Location  Rt shoulder    Electrical Stimulation Action  IFC    Electrical Stimulation Parameters  to tolerance    Electrical Stimulation Goals  Pain;Tone      Vasopneumatic   Number Minutes Vasopneumatic   15 minutes    Vasopnuematic Location   Shoulder    Vasopneumatic Pressure  Low    Vasopneumatic Temperature   34 deg       Manual Therapy   Manual therapy comments  pt supine     Soft tissue mobilization  working through the ant/superior/posterior shoulder girdle musculatrue     Myofascial Release  Lt anterior shoulder     Passive ROM  PROM Lt shoulder in flexion, scaption; IR; ER(rotation in scaption                   PT Long Term Goals - 11/26/17 1714      PT LONG TERM GOAL #1   Title  Decrease pain in the Rt posterior hip by 50-75% allowing patient to participate in functional activities with minimal dysfunction or limitations 01/04/18    Time  12    Period  Weeks    Status  Revised      PT LONG TERM GOAL #2   Title  Improve hip mobility Rt to equal Lt 01/04/18    Time  12    Period  Weeks    Status  Revised      PT LONG TERM GOAL #3   Title  Independent in HEP 01/04/18    Time  12    Period  Weeks    Status  Revised      PT LONG TERM GOAL #4   Title  Improve/maintain FOTO to </= 40% limitation 01/04/18    Time  12    Period  Weeks    Status  Revised      PT LONG TERM GOAL #5   Title  improve Lt shoulder ROM to WFL's and within 5-10 deg of AROM Rt shoulder 01/04/18    Time  12    Period  Weeks    Status  Revised      PT LONG TERM GOAL #6   Title  Increase strength Lt UE to  4/5 to 4+/5  throughout 01/04/18    Time  12    Period  Weeks    Status  Revised      PT LONG TERM GOAL #7   Title  Patient to return to normal functional  activities using Lt UE 01/04/18    Time  12    Period  Weeks    Status  Revised            Plan - 11/27/17 3254    Clinical Impression Statement  Pt tolerated session well today with min c/o pain.  Pt with new onset of bil LE pain which she feels is different than recent hip pain.  Will monitor at this time and address if pain persists.  Continues to need cues for shoulder mechanics, but overall progressing well.    Rehab Potential  Good    PT Frequency  2x / week    PT Duration  6 weeks    PT Treatment/Interventions  Patient/family education;ADLs/Self Care Home Management;Cryotherapy;Electrical Stimulation;Iontophoresis 4mg /ml Dexamethasone;Moist Heat;Ultrasound;Dry needling;Manual techniques;Neuromuscular re-education;Therapeutic activities;Therapeutic exercise    PT Next Visit Plan  Progress Lt shoulder rehab per TSA protocol.  continue core/LE strengthening.    Consulted and Agree with Plan of Care  Patient       Patient will benefit from skilled therapeutic intervention in order to improve the following deficits and impairments:  Postural dysfunction, Improper body mechanics, Pain, Increased fascial restricitons, Increased muscle spasms, Decreased mobility, Decreased range of motion, Decreased activity tolerance  Visit Diagnosis: Chronic left shoulder pain  Muscle weakness (generalized)  Localized edema  Pain in right hip  Other symptoms and signs involving the musculoskeletal system     Problem List Patient Active Problem List   Diagnosis Date Noted  . Chronic musculoskeletal pain 11/13/2017  . 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  . IFG (impaired fasting glucose) 08/03/2017  . Primary osteoarthritis of both knees 07/29/2017  . Osteoarthritis, localized, shoulder, left 07/29/2017  . Sinusitis, chronic   . Asthma 01/08/2012  . Dyslipidemia   . ADD (attention deficit disorder)   . Depression   . Osteopenia   . Incisional hernia   . Stress  incontinence, female       Laureen Abrahams, PT, DPT 11/27/17 8:25 AM    [facility], [place], [address] Phone: [phone]   Fax:  [phone]  Name: [name] MRN: [id number] Date of Birth: 1950-03-28

## 2017-11-28 ENCOUNTER — Encounter: Payer: BC Managed Care – PPO | Admitting: Physical Therapy

## 2017-11-30 ENCOUNTER — Encounter: Payer: Self-pay | Admitting: Physical Therapy

## 2017-11-30 ENCOUNTER — Ambulatory Visit (INDEPENDENT_AMBULATORY_CARE_PROVIDER_SITE_OTHER): Payer: BC Managed Care – PPO | Admitting: Physical Therapy

## 2017-11-30 DIAGNOSIS — M25551 Pain in right hip: Secondary | ICD-10-CM

## 2017-11-30 DIAGNOSIS — M6281 Muscle weakness (generalized): Secondary | ICD-10-CM | POA: Diagnosis not present

## 2017-11-30 DIAGNOSIS — G8929 Other chronic pain: Secondary | ICD-10-CM

## 2017-11-30 DIAGNOSIS — R600 Localized edema: Secondary | ICD-10-CM | POA: Diagnosis not present

## 2017-11-30 DIAGNOSIS — M25512 Pain in left shoulder: Secondary | ICD-10-CM | POA: Diagnosis not present

## 2017-11-30 DIAGNOSIS — R29898 Other symptoms and signs involving the musculoskeletal system: Secondary | ICD-10-CM

## 2017-11-30 NOTE — Therapy (Signed)
[place], [place], [address] Phone: [phone]   Fax:  [phone]  Physical Therapy Treatment  Patient Details  Name: CARISSA MUSICK MRN: [id number] Date of Birth: 03/03/1950 Referring Provider (PT): Dr Lynne Leader - hip; Dr Victorino December - shoulder    Encounter Date: 11/30/2017  PT End of Session - 11/30/17 0927    Visit Number  18    Number of Visits  24    Date for PT Re-Evaluation  01/04/18    PT Start Time  0804    PT Stop Time  0937    PT Time Calculation (min)  93 min    Activity Tolerance  Patient tolerated treatment well    Behavior During Therapy  [facility] for tasks assessed/performed       Past Medical History:  Diagnosis Date  . ADD (attention deficit disorder)   . Arthritis    "bunion on  right" (08/11/2014)  . Chronic bronchitis ([place])    "plenty; but not q yr" (08/11/2014)  . Depression   . Dyslipidemia   . Environmental allergies   . GERD (gastroesophageal reflux disease)   . Hearing difficulty of left ear    "grew up w/deaf parent; find myself lip reading more recently" (08/11/2014)  . Hepatitis    "exposed in college; rec'd gamma globulin; 6 months later S/S (weakness, lethargy, fevers); ; dx'd infectious hepatitis"  . History of herpes genitalis    "stress induced reoccurance that shows up on my left middle finger" (08/11/2014)  . History of hiatal hernia   . History of stomach ulcers   . Hypercholesterolemia   . Hyperthyroidism 1994   "postpartum only; resolved itself"  . Incisional hernia   . Internal hemorrhoids   . Leg cramping   . Osteopenia   . Seasonal asthma   . Stress incontinence, female   . Ulcer     Past Surgical History:  Procedure Laterality Date  . CESAREAN SECTION  1994  . CHALAZION EXCISION Left ~ 2013  . DILATION AND CURETTAGE OF UTERUS  X 2  . EYE SURGERY    . HERNIA REPAIR  08/11/2014  . INCISIONAL HERNIA REPAIR N/A 08/11/2014   Procedure:  LAPAROSCOPIC INCISIONAL HERNIA REPAIR;  Surgeon: Coralie Keens, MD;  Location: [facility];  Service: General;  Laterality: N/A;  . INCONTINENCE SURGERY  2005  . INGUINAL HERNIA REPAIR Left 08/11/2014  . INGUINAL HERNIA REPAIR Left 08/11/2014   Procedure: LEFT INGUINAL HERNIA REPAIR;  Surgeon: Coralie Keens, MD;  Location: [facility];  Service: General;  Laterality: Left;  . INSERTION OF MESH Left 08/11/2014   Procedure: INSERTION OF MESH TO LEFT GROIN AND ABDOMEN;  Surgeon: Coralie Keens, MD;  Location: [facility];  Service: General;  Laterality: Left;  . LAPAROSCOPIC INCISIONAL / UMBILICAL / VENTRAL HERNIA REPAIR  08/11/2014   IHR  . NASAL POLYP EXCISION  2014  . REPAIR OF PERFORATED ULCER  1985  . TONSILLECTOMY    . TOTAL SHOULDER ARTHROPLASTY Left 09/21/2017   Procedure: LEFT TOTAL SHOULDER ARTHROPLASTY;  Surgeon: Nicholes Stairs, MD;  Location: [facility];  Service: Orthopedics;  Laterality: Left;    There were no vitals filed for this visit.  Subjective Assessment - 11/30/17 0807    Subjective  daughter says different parts of Lt arm are swollen; and having increased pain in bil buttocks and legs    Pertinent History  Lt shoulder surgery for TSA 09/21/17; removal Lt little toe phalanx  7/19; bunionectomy 11/16; ingunial and lap hernia repair 6/16; sling proceedure for bladder; sinus surgery; C-section; ulcer repair 1985     Patient Stated Goals  get rid of the hip pain; get shoulder working again     Currently in Pain?  Yes    Pain Score  3    this AM getting up: 8/10   Pain Location  Buttocks    Pain Orientation  Right;Left    Pain Descriptors / Indicators  Sore;Sharp;Constant    Pain Type  Acute pain    Pain Radiating Towards  into bil LEs    Pain Onset  More than a month ago    Pain Frequency  Intermittent    Aggravating Factors   waking up in AM    Pain Relieving Factors  TENS    Pain Score  0         [facility] PT Assessment - 11/30/17 0814      Assessment   Medical Diagnosis  Rt  posterior hip pain; Lt TSA     Referring Provider (PT)  Dr Lynne Leader - hip; Dr Victorino December - shoulder     Onset Date/Surgical Date  08/13/17    Hand Dominance  Right    Next MD Visit  Dr Victorino December 01/04/18      Observation/Other Assessments   Focus on Therapeutic Outcomes (FOTO)   38% limitation                   [facility] Adult PT Treatment/Exercise - 11/30/17 0810      Exercises   Exercises  Shoulder;Lumbar      Lumbar Exercises: Stretches   Passive Hamstring Stretch  Right;Left;2 reps;30 seconds    Prone on Elbows Stretch  3 reps;60 seconds   continuous   Piriformis Stretch  Right;Left;3 reps;30 seconds    Piriformis Stretch Limitations  seated      Lumbar Exercises: Aerobic   Nustep  L5 x 8 min; 4 extremities      Lumbar Exercises: Supine   Bridge  15 reps;5 seconds   with ball squeeze to maintain neutral hip alignment     Lumbar Exercises: Sidelying   Clam  Right;Left;20 reps;3 seconds      Shoulder Exercises: Supine   Flexion  AAROM;20 reps    Flexion Limitations  bench press x 20 reps      Shoulder Exercises: Standing   Protraction  Both;20 reps    Protraction Limitations  cues to decrease shrug    Internal Rotation  AAROM;Both;20 reps   assisting Lt IR with Rt UE    Extension  AAROM;Both;20 reps   with cane    Other Standing Exercises  internal/external rotation walkout with red theraband x 10 on Lt shoulder      Moist Heat Therapy   Number Minutes Moist Heat  15 Minutes    Moist Heat Location  Hip      Electrical Stimulation   Electrical Stimulation Location  bil glutes    Electrical Stimulation Action  pre mod    Electrical Stimulation Parameters  to tolerance x 15 min    Electrical Stimulation Goals  Pain;Tone      Vasopneumatic   Number Minutes Vasopneumatic   15 minutes    Vasopnuematic Location   Shoulder    Vasopneumatic Pressure  Low    Vasopneumatic Temperature   34 deg  PT Long Term Goals -  11/26/17 1714      PT LONG TERM GOAL #1   Title  Decrease pain in the Rt posterior hip by 50-75% allowing patient to participate in functional activities with minimal dysfunction or limitations 01/04/18    Time  12    Period  Weeks    Status  Revised      PT LONG TERM GOAL #2   Title  Improve hip mobility Rt to equal Lt 01/04/18    Time  12    Period  Weeks    Status  Revised      PT LONG TERM GOAL #3   Title  Independent in HEP 01/04/18    Time  12    Period  Weeks    Status  Revised      PT LONG TERM GOAL #4   Title  Improve/maintain FOTO to </= 40% limitation 01/04/18    Time  12    Period  Weeks    Status  Revised      PT LONG TERM GOAL #5   Title  improve Lt shoulder ROM to WFL's and within 5-10 deg of AROM Rt shoulder 01/04/18    Time  12    Period  Weeks    Status  Revised      PT LONG TERM GOAL #6   Title  Increase strength Lt UE to 4/5 to 4+/5  throughout 01/04/18    Time  12    Period  Weeks    Status  Revised      PT LONG TERM GOAL #7   Title  Patient to return to normal functional activities using Lt UE 01/04/18    Time  12    Period  Weeks    Status  Revised            Plan - 11/30/17 1023    Clinical Impression Statement  Pt tolerated session well today without increase in leg or buttock pain, but no significant improvement noted today.  Lt shoulder is progressing well but continues to need cues to decrease shoulder shrug.    Rehab Potential  Good    PT Frequency  2x / week    PT Duration  6 weeks    PT Treatment/Interventions  Patient/family education;ADLs/Self Care Home Management;Cryotherapy;Electrical Stimulation;Iontophoresis 4mg /ml Dexamethasone;Moist Heat;Ultrasound;Dry needling;Manual techniques;Neuromuscular re-education;Therapeutic activities;Therapeutic exercise    PT Next Visit Plan  Progress Lt shoulder rehab per TSA protocol.  continue core/LE strengthening.    Consulted and Agree with Plan of Care  Patient       Patient will benefit  from skilled therapeutic intervention in order to improve the following deficits and impairments:  Postural dysfunction, Improper body mechanics, Pain, Increased fascial restricitons, Increased muscle spasms, Decreased mobility, Decreased range of motion, Decreased activity tolerance  Visit Diagnosis: Chronic left shoulder pain  Muscle weakness (generalized)  Localized edema  Pain in right hip  Other symptoms and signs involving the musculoskeletal system     Problem List Patient Active Problem List   Diagnosis Date Noted  . Chronic musculoskeletal pain 11/13/2017  . 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  . IFG (impaired fasting glucose) 08/03/2017  . Primary osteoarthritis of both knees 07/29/2017  . Osteoarthritis, localized, shoulder, left 07/29/2017  . Sinusitis, chronic   . Asthma 01/08/2012  . Dyslipidemia   . ADD (attention deficit disorder)   . Depression   . Osteopenia   . Incisional hernia   . Stress  incontinence, female       Laureen Abrahams, PT, DPT 11/30/17 10:26 AM     [facility] [place], [place], [address] Phone: [phone]   Fax:  [phone]  Name: NAOMIA LENDERMAN MRN: [id number] Date of Birth: [birth year]

## 2017-12-04 ENCOUNTER — Ambulatory Visit: Payer: BC Managed Care – PPO | Admitting: Family Medicine

## 2017-12-04 ENCOUNTER — Encounter: Payer: Self-pay | Admitting: Rehabilitative and Restorative Service Providers"

## 2017-12-04 ENCOUNTER — Telehealth: Payer: Self-pay | Admitting: Family Medicine

## 2017-12-04 ENCOUNTER — Ambulatory Visit (INDEPENDENT_AMBULATORY_CARE_PROVIDER_SITE_OTHER): Payer: BC Managed Care – PPO | Admitting: Rehabilitative and Restorative Service Providers"

## 2017-12-04 DIAGNOSIS — M6281 Muscle weakness (generalized): Secondary | ICD-10-CM | POA: Diagnosis not present

## 2017-12-04 DIAGNOSIS — R29898 Other symptoms and signs involving the musculoskeletal system: Secondary | ICD-10-CM

## 2017-12-04 DIAGNOSIS — M25512 Pain in left shoulder: Secondary | ICD-10-CM | POA: Diagnosis not present

## 2017-12-04 DIAGNOSIS — M25551 Pain in right hip: Secondary | ICD-10-CM

## 2017-12-04 DIAGNOSIS — R600 Localized edema: Secondary | ICD-10-CM | POA: Diagnosis not present

## 2017-12-04 DIAGNOSIS — G8929 Other chronic pain: Secondary | ICD-10-CM

## 2017-12-04 DIAGNOSIS — R059 Cough, unspecified: Secondary | ICD-10-CM

## 2017-12-04 DIAGNOSIS — R05 Cough: Secondary | ICD-10-CM

## 2017-12-04 MED ORDER — ALBUTEROL SULFATE HFA 108 (90 BASE) MCG/ACT IN AERS: 2.0000 | INHALATION_SPRAY | Freq: Four times a day (QID) | RESPIRATORY_TRACT | 0 refills | Status: DC | PRN

## 2017-12-04 MED ORDER — MIRABEGRON ER 50 MG PO TB24: 50.0000 mg | ORAL_TABLET | Freq: Every day | ORAL | 1 refills | Status: DC

## 2017-12-04 MED ORDER — AMPHETAMINE-DEXTROAMPHETAMINE 20 MG PO TABS: 20.0000 mg | ORAL_TABLET | Freq: Two times a day (BID) | ORAL | 0 refills | Status: DC | PRN

## 2017-12-04 NOTE — Therapy (Signed)
[place], [place], [address] Phone: [phone]   Fax:  [phone]  Physical Therapy Treatment  Patient Details  Name: [name] MRN: [id number] Date of Birth: 1950-04-17 Referring Provider (PT): Dr Lynne Leader - hip; Dr Victorino December - shoulder    Encounter Date: 12/04/2017  PT End of Session - 12/04/17 0944    Visit Number  19    Number of Visits  24    Date for PT Re-Evaluation  01/04/18    PT Start Time  0938    PT Stop Time  1100    PT Time Calculation (min)  82 min    Activity Tolerance  Patient tolerated treatment well       Past Medical History:  Diagnosis Date  . ADD (attention deficit disorder)   . Arthritis    "bunion on  right" (08/11/2014)  . Chronic bronchitis ([place])    "plenty; but not q yr" (08/11/2014)  . Depression   . Dyslipidemia   . Environmental allergies   . GERD (gastroesophageal reflux disease)   . Hearing difficulty of left ear    "grew up w/deaf parent; find myself lip reading more recently" (08/11/2014)  . Hepatitis    "exposed in college; rec'd gamma globulin; 6 months later S/S (weakness, lethargy, fevers); ; dx'd infectious hepatitis"  . History of herpes genitalis    "stress induced reoccurance that shows up on my left middle finger" (08/11/2014)  . History of hiatal hernia   . History of stomach ulcers   . Hypercholesterolemia   . Hyperthyroidism 1994   "postpartum only; resolved itself"  . Incisional hernia   . Internal hemorrhoids   . Leg cramping   . Osteopenia   . Seasonal asthma   . Stress incontinence, female   . Ulcer     Past Surgical History:  Procedure Laterality Date  . CESAREAN SECTION  1994  . CHALAZION EXCISION Left ~ 2013  . DILATION AND CURETTAGE OF UTERUS  X 2  . EYE SURGERY    . HERNIA REPAIR  08/11/2014  . INCISIONAL HERNIA REPAIR N/A 08/11/2014   Procedure: LAPAROSCOPIC INCISIONAL HERNIA REPAIR;  Surgeon: Coralie Keens,  MD;  Location: [facility];  Service: General;  Laterality: N/A;  . INCONTINENCE SURGERY  2005  . INGUINAL HERNIA REPAIR Left 08/11/2014  . INGUINAL HERNIA REPAIR Left 08/11/2014   Procedure: LEFT INGUINAL HERNIA REPAIR;  Surgeon: Coralie Keens, MD;  Location: [facility];  Service: General;  Laterality: Left;  . INSERTION OF MESH Left 08/11/2014   Procedure: INSERTION OF MESH TO LEFT GROIN AND ABDOMEN;  Surgeon: Coralie Keens, MD;  Location: [facility];  Service: General;  Laterality: Left;  . LAPAROSCOPIC INCISIONAL / UMBILICAL / VENTRAL HERNIA REPAIR  08/11/2014   IHR  . NASAL POLYP EXCISION  2014  . REPAIR OF PERFORATED ULCER  1985  . TONSILLECTOMY    . TOTAL SHOULDER ARTHROPLASTY Left 09/21/2017   Procedure: LEFT TOTAL SHOULDER ARTHROPLASTY;  Surgeon: Nicholes Stairs, MD;  Location: [facility];  Service: Orthopedics;  Laterality: Left;    There were no vitals filed for this visit.  Subjective Assessment - 12/04/17 0949    Subjective  Making progress. C/O pain in both legs. She is using Lt UE for more functional activities. She has done less work on the exercises.     Currently in Pain?  No/denies    Pain Location  Buttocks    Pain Orientation  Right;Left    Pain Score  0    Pain Location  Shoulder         [facility] PT Assessment - 12/04/17 0001      Assessment   Medical Diagnosis  Rt posterior hip pain; Lt TSA     Referring Provider (PT)  Dr Lynne Leader - hip; Dr Victorino December - shoulder     Onset Date/Surgical Date  08/13/17    Hand Dominance  Right    Next MD Visit  Dr Victorino December 01/04/18      Precautions   Precaution Comments  Lt TSA       AROM   Right Shoulder Extension  60 Degrees    Right Shoulder Flexion  153 Degrees    Right Shoulder ABduction  161 Degrees    Right Shoulder Internal Rotation  28 Degrees    Right Shoulder External Rotation  90 Degrees    Left Shoulder Extension  50 Degrees    Left Shoulder Flexion  130 Degrees   improved mvt pattern   Left Shoulder ABduction   79 Degrees   in scapular plane-elevation of scapula/poor movement pattern                  [facility] Adult PT Treatment/Exercise - 12/04/17 0001      Lumbar Exercises: Stretches   Piriformis Stretch  Right;Left;3 reps;30 seconds    Piriformis Stretch Limitations  seated    Passive Hamstring Stretch  Right;Left;2 reps;30 seconds   trial of HS stretch in sitting      Lumbar Exercises: Aerobic   Nustep  L5 x 7 min      Lumbar Exercises: Standing   Wall Slides  10 reps;3 seconds   ball on wall    Other Standing Lumbar Exercises  kegel standing with hips slightly flexed forward; LE's in IR - kegel 10 sec x 5       Lumbar Exercises: Supine   Bridge  10 reps;5 seconds      Knee/Hip Exercises: Standing   Hip Abduction  Stengthening;Both;10 reps;Knee straight;2 sets    Hip Extension  Stengthening;Both;2 sets;10 reps;Knee straight    Forward Step Up  Right;Left;20 reps;Hand Hold: 0;Step Height: 6"      Shoulder Exercises: Standing   External Rotation  Strengthening;Right;20 reps;Theraband   side steps holding elbow 90 deg flexion shd adducted   Theraband Level (Shoulder External Rotation)  Level 2 (Red)    Internal Rotation  Strengthening;Left;20 reps;Theraband   side steps holding UE in elbow flexion/shd adduction at side   Theraband Level (Shoulder Internal Rotation)  Level 2 (Red)    Flexion  AROM;Left;15 reps   focus on scapular control    ABduction  AROM;Left;Right;10 reps;20 reps   in scapuar plane    Extension  Strengthening;Both;20 reps;Theraband    Theraband Level (Shoulder Extension)  Level 2 (Red)    Row  Strengthening;Both;20 reps;Theraband    Theraband Level (Shoulder Row)  Level 2 (Red)      Shoulder Exercises: Pulleys   Flexion  2 minutes    Scaption  2 minutes      Shoulder Exercises: Therapy Ball   Other Therapy Ball Exercises  scapular depression pressing into ball at side - shoulder in neutral position at side 5 sec hold 10 reps x 2 sets        Shoulder Exercises: Isometric Strengthening   Flexion Limitations  10x5"    Extension Limitations  10x5"    External Rotation Limitations  10x5'    Internal Rotation Limitations  10x5"    ABduction Limitations  10x5'      Moist Heat Therapy   Number Minutes Moist Heat  20 Minutes    Moist Heat Location  Hip   bilat      Electrical Stimulation   Electrical Stimulation Location  bil glutes    Electrical Stimulation Action  pre mod    Electrical Stimulation Parameters  to tolerance     Electrical Stimulation Goals  Pain;Tone      Vasopneumatic   Number Minutes Vasopneumatic   15 minutes    Vasopnuematic Location   Shoulder    Vasopneumatic Pressure  Low    Vasopneumatic Temperature   34 deg              PT Education - 12/04/17 1108    Education Details  attempted to wrap Lt UE at patient's request - using 2 inch ace wrap     Person(s) Educated  Patient    Methods  Explanation;Demonstration    Comprehension  Verbalized understanding          PT Long Term Goals - 11/26/17 1714      PT LONG TERM GOAL #1   Title  Decrease pain in the Rt posterior hip by 50-75% allowing patient to participate in functional activities with minimal dysfunction or limitations 01/04/18    Time  12    Period  Weeks    Status  Revised      PT LONG TERM GOAL #2   Title  Improve hip mobility Rt to equal Lt 01/04/18    Time  12    Period  Weeks    Status  Revised      PT LONG TERM GOAL #3   Title  Independent in HEP 01/04/18    Time  12    Period  Weeks    Status  Revised      PT LONG TERM GOAL #4   Title  Improve/maintain FOTO to </= 40% limitation 01/04/18    Time  12    Period  Weeks    Status  Revised      PT LONG TERM GOAL #5   Title  improve Lt shoulder ROM to WFL's and within 5-10 deg of AROM Rt shoulder 01/04/18    Time  12    Period  Weeks    Status  Revised      PT LONG TERM GOAL #6   Title  Increase strength Lt UE to 4/5 to 4+/5  throughout 01/04/18    Time  12     Period  Weeks    Status  Revised      PT LONG TERM GOAL #7   Title  Patient to return to normal functional activities using Lt UE 01/04/18    Time  12    Period  Weeks    Status  Revised            Plan - 12/04/17 0956    Clinical Impression Statement  [name] continues to have pain in the LE's - not so much in the Rt buttocks - now having pain in the posterior hips/hamstirns which is worse in the mornings when she gets out of bed. She worked on strengthening and stabilization today without increase in symptoms. Patient progressed with shoulder stabilization and strengthening per protocol. She continues to demonstrate increase in ROM and reports improved functional activities with Lt UE. She is progressing well  toward stated goals of therapy.     Rehab Potential  Good    PT Frequency  2x / week    PT Duration  6 weeks    PT Treatment/Interventions  Patient/family education;ADLs/Self Care Home Management;Cryotherapy;Electrical Stimulation;Iontophoresis 4mg /ml Dexamethasone;Moist Heat;Ultrasound;Dry needling;Manual techniques;Neuromuscular re-education;Therapeutic activities;Therapeutic exercise    PT Next Visit Plan  Progress Lt shoulder rehab per TSA protocol.  continue core/LE strengthening.    Consulted and Agree with Plan of Care  Patient       Patient will benefit from skilled therapeutic intervention in order to improve the following deficits and impairments:  Postural dysfunction, Improper body mechanics, Pain, Increased fascial restricitons, Increased muscle spasms, Decreased mobility, Decreased range of motion, Decreased activity tolerance  Visit Diagnosis: Chronic left shoulder pain  Muscle weakness (generalized)  Localized edema  Pain in right hip  Other symptoms and signs involving the musculoskeletal system     Problem List Patient Active Problem List   Diagnosis Date Noted  . Chronic musculoskeletal pain 11/13/2017  . History of left shoulder replacement  09/21/2017  . IFG (impaired fasting glucose) 08/03/2017  . Primary osteoarthritis of both knees 07/29/2017  . Osteoarthritis, localized, shoulder, left 07/29/2017  . Sinusitis, chronic   . Asthma 01/08/2012  . Dyslipidemia   . ADD (attention deficit disorder)   . Depression   . Osteopenia   . Incisional hernia   . Stress incontinence, female     Everardo All PT, MPH  12/04/2017, 11:09 AM  [facility] [place], [place], [address] Phone: [phone]   Fax:  [phone]  Name: [name] MRN: [id number] Date of Birth: 1950/11/27

## 2017-12-04 NOTE — Telephone Encounter (Signed)
She came in 20 minutes late for her appointment she has rescheduled for November 18.  But in the meantime needs refills for albuterol and her Adderall.  Prescriptions sent to her mail order.  She also wanted to go ahead and try bumping up for me over track from 25 mg to 50 mg to see if this helps with her overactive bladder symptoms.  New Myrbetrek prescription sent to [facility] [address].  Please notify patient that these prescriptions have been sent.

## 2017-12-04 NOTE — Telephone Encounter (Signed)
Pt informed.[name] [name]  

## 2017-12-07 ENCOUNTER — Encounter: Payer: Self-pay | Admitting: Rehabilitative and Restorative Service Providers"

## 2017-12-07 ENCOUNTER — Ambulatory Visit (INDEPENDENT_AMBULATORY_CARE_PROVIDER_SITE_OTHER): Payer: BC Managed Care – PPO | Admitting: Rehabilitative and Restorative Service Providers"

## 2017-12-07 DIAGNOSIS — R29898 Other symptoms and signs involving the musculoskeletal system: Secondary | ICD-10-CM

## 2017-12-07 DIAGNOSIS — R600 Localized edema: Secondary | ICD-10-CM

## 2017-12-07 DIAGNOSIS — M25512 Pain in left shoulder: Secondary | ICD-10-CM | POA: Diagnosis not present

## 2017-12-07 DIAGNOSIS — M25551 Pain in right hip: Secondary | ICD-10-CM

## 2017-12-07 DIAGNOSIS — M6281 Muscle weakness (generalized): Secondary | ICD-10-CM | POA: Diagnosis not present

## 2017-12-07 DIAGNOSIS — G8929 Other chronic pain: Secondary | ICD-10-CM

## 2017-12-07 NOTE — Therapy (Signed)
[place], [place], [address] Phone: [phone]   Fax:  [phone]  Physical Therapy Treatment  Patient Details  Name: [name] MRN: [id number] Date of Birth: 12-07-1950 Referring Provider (PT): Dr Lynne Leader - hip; Dr Victorino December - shoulder    Encounter Date: 12/07/2017  PT End of Session - 12/07/17 0814    Visit Number  20    Number of Visits  24    Date for PT Re-Evaluation  01/04/18    PT Start Time  0810   10 min late for appt    PT Stop Time  0925    PT Time Calculation (min)  75 min    Activity Tolerance  Patient tolerated treatment well       Past Medical History:  Diagnosis Date  . ADD (attention deficit disorder)   . Arthritis    "bunion on  right" (08/11/2014)  . Chronic bronchitis ([place])    "plenty; but not q yr" (08/11/2014)  . Depression   . Dyslipidemia   . Environmental allergies   . GERD (gastroesophageal reflux disease)   . Hearing difficulty of left ear    "grew up w/deaf parent; find myself lip reading more recently" (08/11/2014)  . Hepatitis    "exposed in college; rec'd gamma globulin; 6 months later S/S (weakness, lethargy, fevers); ; dx'd infectious hepatitis"  . History of herpes genitalis    "stress induced reoccurance that shows up on my left middle finger" (08/11/2014)  . History of hiatal hernia   . History of stomach ulcers   . Hypercholesterolemia   . Hyperthyroidism 1994   "postpartum only; resolved itself"  . Incisional hernia   . Internal hemorrhoids   . Leg cramping   . Osteopenia   . Seasonal asthma   . Stress incontinence, female   . Ulcer     Past Surgical History:  Procedure Laterality Date  . CESAREAN SECTION  1994  . CHALAZION EXCISION Left ~ 2013  . DILATION AND CURETTAGE OF UTERUS  X 2  . EYE SURGERY    . HERNIA REPAIR  08/11/2014  . INCISIONAL HERNIA REPAIR N/A 08/11/2014   Procedure: LAPAROSCOPIC INCISIONAL HERNIA REPAIR;   Surgeon: Coralie Keens, MD;  Location: [facility];  Service: General;  Laterality: N/A;  . INCONTINENCE SURGERY  2005  . INGUINAL HERNIA REPAIR Left 08/11/2014  . INGUINAL HERNIA REPAIR Left 08/11/2014   Procedure: LEFT INGUINAL HERNIA REPAIR;  Surgeon: Coralie Keens, MD;  Location: [facility];  Service: General;  Laterality: Left;  . INSERTION OF MESH Left 08/11/2014   Procedure: INSERTION OF MESH TO LEFT GROIN AND ABDOMEN;  Surgeon: Coralie Keens, MD;  Location: [facility];  Service: General;  Laterality: Left;  . LAPAROSCOPIC INCISIONAL / UMBILICAL / VENTRAL HERNIA REPAIR  08/11/2014   IHR  . NASAL POLYP EXCISION  2014  . REPAIR OF PERFORATED ULCER  1985  . TONSILLECTOMY    . TOTAL SHOULDER ARTHROPLASTY Left 09/21/2017   Procedure: LEFT TOTAL SHOULDER ARTHROPLASTY;  Surgeon: Nicholes Stairs, MD;  Location: [facility];  Service: Orthopedics;  Laterality: Left;    There were no vitals filed for this visit.  Subjective Assessment - 12/07/17 0815    Subjective  Patient reports that she is doing OK today. HA yesterday but better today. She is improving. Having some pain in the Lt knee. Having "serious agony" in the Lt knee when she gets out of bed in the morning.  She has intermittent pain in both legs in the back of the knee - hip to knee.     Currently in Pain?  No/denies    Pain Location  Buttocks    Pain Orientation  Right;Left    Pain Score  0                       [facility] Adult PT Treatment/Exercise - 12/07/17 0001      Lumbar Exercises: Aerobic   Nustep  L5 L/UE's (arm 10) x 10 min      Lumbar Exercises: Standing   Wall Slides  10 reps;3 seconds   2 sets      Knee/Hip Exercises: Stretches   Piriformis Stretch  Right;Left;2 reps;20 seconds    Gastroc Stretch  Right;Left;2 reps;30 seconds      Knee/Hip Exercises: Standing   Hip Abduction  Stengthening;Both;10 reps;Knee straight;2 sets    Hip Extension  Stengthening;Both;2 sets;10 reps;Knee straight    Forward Step Up   Right;Left;20 reps;Hand Hold: 0;Step Height: 6"      Shoulder Exercises: Standing   External Rotation  Strengthening;Right;20 reps;Theraband   side steps holding elbow 90 deg flexion shd adducted   Theraband Level (Shoulder External Rotation)  Level 2 (Red)    Internal Rotation  Strengthening;Left;20 reps;Theraband   side steps holding UE in elbow flexion/shd adduction at side   Theraband Level (Shoulder Internal Rotation)  Level 2 (Red)    Flexion  AROM;Left;15 reps   focus on scapular control    ABduction  AROM;Left;Right;10 reps;20 reps   in scapuar plane    Extension  Strengthening;Both;20 reps;Theraband    Theraband Level (Shoulder Extension)  Level 2 (Red)    Row  Strengthening;Both;20 reps;Theraband    Theraband Level (Shoulder Row)  Level 2 (Red)    Other Standing Exercises  reverse wall push up x 10 reps x 2 sets      Shoulder Exercises: Therapy Ball   Flexion  Right;Left;10 reps   rolling ball up wall    Other Therapy Ball Exercises  scapular depression pressing into ball at side - shoulder in neutral position at side 5 sec hold 10 reps x 2 sets     Other Therapy Ball Exercises  shoulder flexion with ball on wall ~ 110-115 deg flexion holding hand on wall moving hands on ball 30-60 sec x 3 reps       Cryotherapy   Number Minutes Cryotherapy  15 Minutes    Cryotherapy Location  Knee   Lt   Type of Cryotherapy  Ice pack      Electrical Stimulation   Electrical Stimulation Location  medial/lateral Kt knee     Electrical Stimulation Action  TENS    Electrical Stimulation Parameters  to tolerance    Electrical Stimulation Goals  Pain;Tone      Vasopneumatic   Number Minutes Vasopneumatic   15 minutes    Vasopnuematic Location   Shoulder    Vasopneumatic Pressure  Low    Vasopneumatic Temperature   34 deg                   PT Long Term Goals - 11/26/17 1714      PT LONG TERM GOAL #1   Title  Decrease pain in the Rt posterior hip by 50-75% allowing  patient to participate in functional activities with minimal dysfunction or limitations 01/04/18    Time  12    Period  Weeks  Status  Revised      PT LONG TERM GOAL #2   Title  Improve hip mobility Rt to equal Lt 01/04/18    Time  12    Period  Weeks    Status  Revised      PT LONG TERM GOAL #3   Title  Independent in HEP 01/04/18    Time  12    Period  Weeks    Status  Revised      PT LONG TERM GOAL #4   Title  Improve/maintain FOTO to </= 40% limitation 01/04/18    Time  12    Period  Weeks    Status  Revised      PT LONG TERM GOAL #5   Title  improve Lt shoulder ROM to WFL's and within 5-10 deg of AROM Rt shoulder 01/04/18    Time  12    Period  Weeks    Status  Revised      PT LONG TERM GOAL #6   Title  Increase strength Lt UE to 4/5 to 4+/5  throughout 01/04/18    Time  12    Period  Weeks    Status  Revised      PT LONG TERM GOAL #7   Title  Patient to return to normal functional activities using Lt UE 01/04/18    Time  12    Period  Weeks    Status  Revised            Plan - 12/07/17 0815    Clinical Impression Statement  No Lt shoulder pain or Rt hip pain. She has pain in the Lt knee today. [place] continues to work well in the clinic adding exercises for U/LE's. Note improveing shoulder mechanics with elevation Lt shoulder. Progressing toward stated goals of therapy.     Rehab Potential  Good    PT Frequency  2x / week    PT Duration  6 weeks    PT Treatment/Interventions  Patient/family education;ADLs/Self Care Home Management;Cryotherapy;Electrical Stimulation;Iontophoresis 4mg /ml Dexamethasone;Moist Heat;Ultrasound;Dry needling;Manual techniques;Neuromuscular re-education;Therapeutic activities;Therapeutic exercise    PT Next Visit Plan  Progress Lt shoulder rehab per TSA protocol.  continue core/LE strengthening.    Consulted and Agree with Plan of Care  Patient       Patient will benefit from skilled therapeutic intervention in order to improve the  following deficits and impairments:  Postural dysfunction, Improper body mechanics, Pain, Increased fascial restricitons, Increased muscle spasms, Decreased mobility, Decreased range of motion, Decreased activity tolerance  Visit Diagnosis: Chronic left shoulder pain  Muscle weakness (generalized)  Localized edema  Pain in right hip  Other symptoms and signs involving the musculoskeletal system     Problem List Patient Active Problem List   Diagnosis Date Noted  . Chronic musculoskeletal pain 11/13/2017  . 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  . IFG (impaired fasting glucose) 08/03/2017  . Primary osteoarthritis of both knees 07/29/2017  . Osteoarthritis, localized, shoulder, left 07/29/2017  . Sinusitis, chronic   . Asthma 01/08/2012  . Dyslipidemia   . ADD (attention deficit disorder)   . Depression   . Osteopenia   . Incisional hernia   . Stress incontinence, female     Everardo All PT, MPH  12/07/2017, 9:16 AM  [facility] [place], [place], [address] Phone: [phone]   Fax:  [phone]  Name: DEMI TRIEU MRN: [id number] Date of Birth: October 08, 1950

## 2017-12-11 ENCOUNTER — Encounter: Payer: BC Managed Care – PPO | Admitting: Physical Therapy

## 2017-12-12 ENCOUNTER — Ambulatory Visit (INDEPENDENT_AMBULATORY_CARE_PROVIDER_SITE_OTHER): Payer: BC Managed Care – PPO | Admitting: Rehabilitative and Restorative Service Providers"

## 2017-12-12 ENCOUNTER — Encounter: Payer: Self-pay | Admitting: Rehabilitative and Restorative Service Providers"

## 2017-12-12 DIAGNOSIS — M25512 Pain in left shoulder: Secondary | ICD-10-CM | POA: Diagnosis not present

## 2017-12-12 DIAGNOSIS — M25551 Pain in right hip: Secondary | ICD-10-CM | POA: Diagnosis not present

## 2017-12-12 DIAGNOSIS — M6281 Muscle weakness (generalized): Secondary | ICD-10-CM | POA: Diagnosis not present

## 2017-12-12 DIAGNOSIS — R600 Localized edema: Secondary | ICD-10-CM

## 2017-12-12 DIAGNOSIS — R29898 Other symptoms and signs involving the musculoskeletal system: Secondary | ICD-10-CM

## 2017-12-12 DIAGNOSIS — G8929 Other chronic pain: Secondary | ICD-10-CM

## 2017-12-12 NOTE — Therapy (Addendum)
[place], [place], [address] Phone: [phone]   Fax:  [phone]  Physical Therapy Treatment  Patient Details  Name: [name] MRN: [id number] Date of Birth: 12-19-1950 Referring Provider (PT): Dr Lynne Leader - hip; Dr Victorino December - shoulder    Encounter Date: 12/12/2017  PT End of Session - 12/12/17 1412    Visit Number  21    Number of Visits  24    Date for PT Re-Evaluation  01/04/18    PT Start Time  9767    PT Stop Time  1530    PT Time Calculation (min)  78 min    Activity Tolerance  Patient tolerated treatment well       Past Medical History:  Diagnosis Date  . ADD (attention deficit disorder)   . Arthritis    "bunion on  right" (08/11/2014)  . Chronic bronchitis ([place])    "plenty; but not q yr" (08/11/2014)  . Depression   . Dyslipidemia   . Environmental allergies   . GERD (gastroesophageal reflux disease)   . Hearing difficulty of left ear    "grew up w/deaf parent; find myself lip reading more recently" (08/11/2014)  . Hepatitis    "exposed in college; rec'd gamma globulin; 6 months later S/S (weakness, lethargy, fevers); ; dx'd infectious hepatitis"  . History of herpes genitalis    "stress induced reoccurance that shows up on my left middle finger" (08/11/2014)  . History of hiatal hernia   . History of stomach ulcers   . Hypercholesterolemia   . Hyperthyroidism 1994   "postpartum only; resolved itself"  . Incisional hernia   . Internal hemorrhoids   . Leg cramping   . Osteopenia   . Seasonal asthma   . Stress incontinence, female   . Ulcer     Past Surgical History:  Procedure Laterality Date  . CESAREAN SECTION  1994  . CHALAZION EXCISION Left ~ 2013  . DILATION AND CURETTAGE OF UTERUS  X 2  . EYE SURGERY    . HERNIA REPAIR  08/11/2014  . INCISIONAL HERNIA REPAIR N/A 08/11/2014   Procedure: LAPAROSCOPIC INCISIONAL HERNIA REPAIR;  Surgeon: Coralie Keens,  MD;  Location: [facility];  Service: General;  Laterality: N/A;  . INCONTINENCE SURGERY  2005  . INGUINAL HERNIA REPAIR Left 08/11/2014  . INGUINAL HERNIA REPAIR Left 08/11/2014   Procedure: LEFT INGUINAL HERNIA REPAIR;  Surgeon: Coralie Keens, MD;  Location: [facility];  Service: General;  Laterality: Left;  . INSERTION OF MESH Left 08/11/2014   Procedure: INSERTION OF MESH TO LEFT GROIN AND ABDOMEN;  Surgeon: Coralie Keens, MD;  Location: [facility];  Service: General;  Laterality: Left;  . LAPAROSCOPIC INCISIONAL / UMBILICAL / VENTRAL HERNIA REPAIR  08/11/2014   IHR  . NASAL POLYP EXCISION  2014  . REPAIR OF PERFORATED ULCER  1985  . TONSILLECTOMY    . TOTAL SHOULDER ARTHROPLASTY Left 09/21/2017   Procedure: LEFT TOTAL SHOULDER ARTHROPLASTY;  Surgeon: Nicholes Stairs, MD;  Location: [facility];  Service: Orthopedics;  Laterality: Left;    There were no vitals filed for this visit.  Subjective Assessment - 12/12/17 1415    Subjective  Exercises for bladder and pelvic floor dysfunction have increased her Rt buttock pain. She is working on the exercises at home. Shoulder is doing well. Patient reports that she had both knees yesterday (2nd round of injections for knees)     Currently in Pain?  Yes    Pain Score  2     Pain Location  Buttocks    Pain Orientation  Right    Pain Descriptors / Indicators  Aching;Sore    Pain Score  0         [facility] PT Assessment - 12/12/17 0001      Assessment   Medical Diagnosis  Rt posterior hip pain; Lt TSA     Referring Provider (PT)  Dr Lynne Leader - hip; Dr Victorino December - shoulder     Onset Date/Surgical Date  08/13/17    Hand Dominance  Right    Next MD Visit  Dr Victorino December 01/04/18      Precautions   Precaution Comments  Lt TSA       AROM   Right Shoulder Extension  60 Degrees    Right Shoulder Flexion  153 Degrees    Right Shoulder ABduction  161 Degrees    Right Shoulder Internal Rotation  28 Degrees    Right Shoulder External Rotation  90  Degrees    Left Shoulder Extension  50 Degrees    Left Shoulder Flexion  131 Degrees    Left Shoulder ABduction  94 Degrees   poor movement pattern - some discomfort      PROM   Left Shoulder Flexion  143 Degrees    Left Shoulder ABduction  138 Degrees    Left Shoulder Internal Rotation  52 Degrees   in scapular plane    Left Shoulder External Rotation  30 Degrees   in scapuar plane                   [facility] Adult PT Treatment/Exercise - 12/12/17 0001      Lumbar Exercises: Aerobic   Nustep  L5 L/UE's (arm 10) x 10 min      Lumbar Exercises: Standing   Wall Slides  10 reps;5 seconds   2 sets      Knee/Hip Exercises: Standing   Hip Abduction  Stengthening;Both;3 sets;10 reps;Knee straight    Hip Extension  Stengthening;Both;2 sets;10 reps;Knee straight    Forward Step Up  Right;Left;20 reps;Hand Hold: 1;Step Height: 6"    Functional Squat  10 reps;3 seconds      Shoulder Exercises: Standing   External Rotation  Strengthening;Right;20 reps;Theraband   side steps holding elbow 90 deg flexion shd adducted   Theraband Level (Shoulder External Rotation)  Level 3 (Green)    Internal Rotation  Strengthening;Left;20 reps;Theraband   side steps holding UE in elbow flexion/shd adduction at side   Theraband Level (Shoulder Internal Rotation)  Level 3 (Green)    Flexion  AROM;Strengthening;Left;Right;15 reps;Weights   focus on scapular control -    Shoulder Flexion Weight (lbs)  1    ABduction  AROM;20 reps;Strengthening;Right;Left;Weights   in scapuar plane - through partial range ~ 45 deg scaption   Shoulder ABduction Weight (lbs)  1    Extension  Strengthening;Both;20 reps;Theraband    Theraband Level (Shoulder Extension)  Level 3 (Green)    Row  Strengthening;Both;20 reps;Theraband    Theraband Level (Shoulder Row)  Level 3 (Green)    Other Standing Exercises  reverse wall push up x 10 reps x 2 sets      Shoulder Exercises: Pulleys   Flexion  2 minutes    Scaption   2 minutes      Shoulder Exercises: Therapy Ball   Flexion  Right;Left;10 reps   rolling ball up wall  Other Therapy Ball Exercises  scapular depression pressing into ball at side - shoulder in neutral position at side 5 sec hold 10 reps x 2 sets     Other Therapy Ball Exercises  shoulder flexion with ball on wall ~ 110-115 deg flexion holding hand on wall moving hands on ball 30-60 sec x 3 reps         Treatment was concluded with: Vaso to Lt shoulder x 15 min low pressure 34 deg   TENs bilat hips/buttocks x 15 min to pt tolerance  Moist heat to bilat lumbar/hip area x 15 min      PT Education - 12/12/17 1504    Education Details  will progress to green TB for UE strengthening and add 1# weight for scaption and flexion    Person(s) Educated  Patient    Methods  Explanation    Comprehension  Verbalized understanding          PT Long Term Goals - 11/26/17 1714      PT LONG TERM GOAL #1   Title  Decrease pain in the Rt posterior hip by 50-75% allowing patient to participate in functional activities with minimal dysfunction or limitations 01/04/18    Time  12    Period  Weeks    Status  Revised      PT LONG TERM GOAL #2   Title  Improve hip mobility Rt to equal Lt 01/04/18    Time  12    Period  Weeks    Status  Revised      PT LONG TERM GOAL #3   Title  Independent in HEP 01/04/18    Time  12    Period  Weeks    Status  Revised      PT LONG TERM GOAL #4   Title  Improve/maintain FOTO to </= 40% limitation 01/04/18    Time  12    Period  Weeks    Status  Revised      PT LONG TERM GOAL #5   Title  improve Lt shoulder ROM to WFL's and within 5-10 deg of AROM Rt shoulder 01/04/18    Time  12    Period  Weeks    Status  Revised      PT LONG TERM GOAL #6   Title  Increase strength Lt UE to 4/5 to 4+/5  throughout 01/04/18    Time  12    Period  Weeks    Status  Revised      PT LONG TERM GOAL #7   Title  Patient to return to normal functional activities using  Lt UE 01/04/18    Time  12    Period  Weeks    Status  Revised            Plan - 12/12/17 1415    Clinical Impression Statement  No shoulder pain. She is increasing functional activities with Lt UE. ROM is increasing and patient increased resistance for UE strengthening exercises. Juliahna reports that she hsa been having pain in the Rt buttocks since beginning pelvic floor exercises. She continues to have pain in knees; feet; etc. Injections have helped with knee pain. Patient is progressing well with rehab.     Rehab Potential  Good    PT Frequency  2x / week    PT Duration  6 weeks    PT Treatment/Interventions  Patient/family education;ADLs/Self Care Home Management;Cryotherapy;Electrical Stimulation;Iontophoresis 4mg /ml Dexamethasone;Moist Heat;Ultrasound;Dry needling;Manual techniques;Neuromuscular re-education;Therapeutic activities;Therapeutic  exercise    PT Next Visit Plan  Progress Lt shoulder rehab per TSA protocol.  continue core/LE strengthening. Will schedule for 2x/wk next week then place patient on hold pending MD visit vs one additional visit prior to MD visit 01/04/18. Anticipate d/c  to independent HEP at that time.     Consulted and Agree with Plan of Care  Patient       Patient will benefit from skilled therapeutic intervention in order to improve the following deficits and impairments:  Postural dysfunction, Improper body mechanics, Pain, Increased fascial restricitons, Increased muscle spasms, Decreased mobility, Decreased range of motion, Decreased activity tolerance  Visit Diagnosis: Chronic left shoulder pain  Muscle weakness (generalized)  Localized edema  Pain in right hip  Other symptoms and signs involving the musculoskeletal system     Problem List Patient Active Problem List   Diagnosis Date Noted  . Chronic musculoskeletal pain 11/13/2017  . 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  . IFG (impaired fasting glucose) 08/03/2017  . Primary  osteoarthritis of both knees 07/29/2017  . Osteoarthritis, localized, shoulder, left 07/29/2017  . Sinusitis, chronic   . Asthma 01/08/2012  . Dyslipidemia   . ADD (attention deficit disorder)   . Depression   . Osteopenia   . Incisional hernia   . Stress incontinence, female     Everardo All PT, MPH  12/12/2017, 3:32 PM  [facility] [place], [place], [address] Phone: [phone]   Fax:  [phone]  Name: TATISHA CERINO MRN: [id number] Date of Birth: Nov 30, 1950

## 2017-12-14 ENCOUNTER — Encounter: Payer: Self-pay | Admitting: Gastroenterology

## 2017-12-14 ENCOUNTER — Ambulatory Visit (INDEPENDENT_AMBULATORY_CARE_PROVIDER_SITE_OTHER): Payer: BC Managed Care – PPO | Admitting: Physical Therapy

## 2017-12-14 DIAGNOSIS — R600 Localized edema: Secondary | ICD-10-CM

## 2017-12-14 DIAGNOSIS — R29898 Other symptoms and signs involving the musculoskeletal system: Secondary | ICD-10-CM

## 2017-12-14 DIAGNOSIS — G8929 Other chronic pain: Secondary | ICD-10-CM

## 2017-12-14 DIAGNOSIS — M25512 Pain in left shoulder: Secondary | ICD-10-CM | POA: Diagnosis not present

## 2017-12-14 DIAGNOSIS — M25551 Pain in right hip: Secondary | ICD-10-CM

## 2017-12-14 DIAGNOSIS — M6281 Muscle weakness (generalized): Secondary | ICD-10-CM | POA: Diagnosis not present

## 2017-12-14 NOTE — Patient Instructions (Signed)
  Quads / HF, Prone KNEE: Quadriceps - Prone    Place strap around ankle. Bring ankle toward buttocks. Press hip into surface. Hold 30 seconds. Repeat 3 times per session. Do 2-3 sessions per day.

## 2017-12-14 NOTE — Therapy (Signed)
[place], [place], [address] Phone: [phone]   Fax:  [phone]  Physical Therapy Treatment  Patient Details  Name: [name] MRN: [id number] Date of Birth: December 09, 1950 Referring Provider (PT): Dr Lynne Leader - hip; Dr Victorino December - shoulder    Encounter Date: 12/14/2017  PT End of Session - 12/14/17 0826    Visit Number  22    Number of Visits  24    Date for PT Re-Evaluation  01/04/18    PT Start Time  0820   Pt arrived late and had phone call.    PT Stop Time  0921    PT Time Calculation (min)  61 min    Activity Tolerance  Patient tolerated treatment well;No increased pain    Behavior During Therapy  WFL for tasks assessed/performed       Past Medical History:  Diagnosis Date  . ADD (attention deficit disorder)   . Arthritis    "bunion on  right" (08/11/2014)  . Chronic bronchitis ([place])    "plenty; but not q yr" (08/11/2014)  . Depression   . Dyslipidemia   . Environmental allergies   . GERD (gastroesophageal reflux disease)   . Hearing difficulty of left ear    "grew up w/deaf parent; find myself lip reading more recently" (08/11/2014)  . Hepatitis    "exposed in college; rec'd gamma globulin; 6 months later S/S (weakness, lethargy, fevers); ; dx'd infectious hepatitis"  . History of herpes genitalis    "stress induced reoccurance that shows up on my left middle finger" (08/11/2014)  . History of hiatal hernia   . History of stomach ulcers   . Hypercholesterolemia   . Hyperthyroidism 1994   "postpartum only; resolved itself"  . Incisional hernia   . Internal hemorrhoids   . Leg cramping   . Osteopenia   . Seasonal asthma   . Stress incontinence, female   . Ulcer     Past Surgical History:  Procedure Laterality Date  . CESAREAN SECTION  1994  . CHALAZION EXCISION Left ~ 2013  . DILATION AND CURETTAGE OF UTERUS  X 2  . EYE SURGERY    . HERNIA REPAIR  08/11/2014  .  INCISIONAL HERNIA REPAIR N/A 08/11/2014   Procedure: LAPAROSCOPIC INCISIONAL HERNIA REPAIR;  Surgeon: Coralie Keens, MD;  Location: [facility];  Service: General;  Laterality: N/A;  . INCONTINENCE SURGERY  2005  . INGUINAL HERNIA REPAIR Left 08/11/2014  . INGUINAL HERNIA REPAIR Left 08/11/2014   Procedure: LEFT INGUINAL HERNIA REPAIR;  Surgeon: Coralie Keens, MD;  Location: [facility];  Service: General;  Laterality: Left;  . INSERTION OF MESH Left 08/11/2014   Procedure: INSERTION OF MESH TO LEFT GROIN AND ABDOMEN;  Surgeon: Coralie Keens, MD;  Location: [facility];  Service: General;  Laterality: Left;  . LAPAROSCOPIC INCISIONAL / UMBILICAL / VENTRAL HERNIA REPAIR  08/11/2014   IHR  . NASAL POLYP EXCISION  2014  . REPAIR OF PERFORATED ULCER  1985  . TONSILLECTOMY    . TOTAL SHOULDER ARTHROPLASTY Left 09/21/2017   Procedure: LEFT TOTAL SHOULDER ARTHROPLASTY;  Surgeon: Nicholes Stairs, MD;  Location: [facility];  Service: Orthopedics;  Laterality: Left;    There were no vitals filed for this visit.  Subjective Assessment - 12/14/17 0823    Subjective  "I think I'm doing good."  She reports her hip is not bothering her as much when transitioning from supine to sitting.  She  is sleeping better.  Main concern is that her LE are sore and she worries she won't be able to get to bathhroom in enough time.      Patient Stated Goals  get rid of the hip pain; get shoulder working again     Currently in Pain?  No/denies         [facility] PT Assessment - 12/14/17 0001      Assessment   Medical Diagnosis  Rt posterior hip pain; Lt TSA     Referring Provider (PT)  Dr Lynne Leader - hip; Dr Victorino December - shoulder     Onset Date/Surgical Date  08/13/17    Hand Dominance  Right    Next MD Visit  Dr Victorino December 01/04/18        [facility] Adult PT Treatment/Exercise - 12/14/17 0001      Exercises   Exercises  Shoulder;Elbow;Knee/Hip;Lumbar      Elbow Exercises   Elbow Flexion  Both;10 reps    Bar  Weights/Barbell (Elbow Flexion)  4 lbs    Elbow Flexion Limitations  2nd set with mini squat x 10       Lumbar Exercises: Stretches   Quad Stretch  Right;Left;30 seconds   prone with strap   Piriformis Stretch  Right;Left;3 reps;30 seconds    Piriformis Stretch Limitations  seated      Shoulder Exercises: Standing   External Rotation  Strengthening;Right;20 reps;Theraband   side steps holding elbow 90 deg flexion shd adducted   Theraband Level (Shoulder External Rotation)  Level 3 (Green)    Internal Rotation  Strengthening;Left;20 reps;Theraband   side steps holding UE in elbow flexion/shd adduction at side   Theraband Level (Shoulder Internal Rotation)  Level 3 (Green)    Other Standing Exercises  reverse wall push up x 10 reps    Other Standing Exercises  Lt shoulder flexion (110 deg) with 1# wt to eye level shelf x 5, 2# x 10       Shoulder Exercises: Pulleys   Flexion  2 minutes    Scaption  2 minutes      Shoulder Exercises: ROM/Strengthening   UBE (Upper Arm Bike)  L2: 1 min forward/ 1 min backward       Moist Heat Therapy   Number Minutes Moist Heat  15 Minutes    Moist Heat Location  --   bilat thigh      Vasopneumatic   Number Minutes Vasopneumatic   15 minutes    Vasopnuematic Location   Shoulder    Vasopneumatic Pressure  Low    Vasopneumatic Temperature   34 deg              PT Education - 12/14/17 0922    Education Details  Added quad stretch to HEP    Person(s) Educated  Patient    Methods  Explanation;Handout;Verbal cues;Demonstration    Comprehension  Verbalized understanding          PT Long Term Goals - 11/26/17 1714      PT LONG TERM GOAL #1   Title  Decrease pain in the Rt posterior hip by 50-75% allowing patient to participate in functional activities with minimal dysfunction or limitations 01/04/18    Time  12    Period  Weeks    Status  Revised      PT LONG TERM GOAL #2   Title  Improve hip mobility Rt to equal Lt 01/04/18     Time  12  Period  Weeks    Status  Revised      PT LONG TERM GOAL #3   Title  Independent in HEP 01/04/18    Time  12    Period  Weeks    Status  Revised      PT LONG TERM GOAL #4   Title  Improve/maintain FOTO to </= 40% limitation 01/04/18    Time  12    Period  Weeks    Status  Revised      PT LONG TERM GOAL #5   Title  improve Lt shoulder ROM to WFL's and within 5-10 deg of AROM Rt shoulder 01/04/18    Time  12    Period  Weeks    Status  Revised      PT LONG TERM GOAL #6   Title  Increase strength Lt UE to 4/5 to 4+/5  throughout 01/04/18    Time  12    Period  Weeks    Status  Revised      PT LONG TERM GOAL #7   Title  Patient to return to normal functional activities using Lt UE 01/04/18    Time  12    Period  Weeks    Status  Revised            Plan - 12/14/17 0914    Clinical Impression Statement  Pt reporting 50% improvement in Rt hip/LE pain.  She tolerated all exercises well, without increase in symptoms.  She has partially met her goals and is nearing d/c.      PT Frequency  2x / week    PT Duration  6 weeks    PT Treatment/Interventions  Patient/family education;ADLs/Self Care Home Management;Cryotherapy;Electrical Stimulation;Iontophoresis 72m/ml Dexamethasone;Moist Heat;Ultrasound;Dry needling;Manual techniques;Neuromuscular re-education;Therapeutic activities;Therapeutic exercise    PT Next Visit Plan  Progress Lt shoulder rehab per TSA protocol.  continue core/LE strengthening. Will schedule for 2x/wk next week then place patient on hold pending MD visit vs one additional visit prior to MD visit 01/04/18. Anticipate d/c  to independent HEP at that time.     Consulted and Agree with Plan of Care  Patient    Family Member Consulted  --       Patient will benefit from skilled therapeutic intervention in order to improve the following deficits and impairments:  Postural dysfunction, Improper body mechanics, Pain, Increased fascial restricitons, Increased  muscle spasms, Decreased mobility, Decreased range of motion, Decreased activity tolerance  Visit Diagnosis: Chronic left shoulder pain  Muscle weakness (generalized)  Localized edema  Pain in right hip  Other symptoms and signs involving the musculoskeletal system     Problem List Patient Active Problem List   Diagnosis Date Noted  . Chronic musculoskeletal pain 11/13/2017  . 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  . IFG (impaired fasting glucose) 08/03/2017  . Primary osteoarthritis of both knees 07/29/2017  . Osteoarthritis, localized, shoulder, left 07/29/2017  . Sinusitis, chronic   . Asthma 01/08/2012  . Dyslipidemia   . ADD (attention deficit disorder)   . Depression   . Osteopenia   . Incisional hernia   . Stress incontinence, female    JKerin Perna PDelaware11/15/19 9:27 AM  [place] [facility] [place] [address]Phone: [phone]  Fax:  [phone] Name: SELHAM FINIMRN: [id number] of Birth: 907/03/1950

## 2017-12-17 ENCOUNTER — Ambulatory Visit (INDEPENDENT_AMBULATORY_CARE_PROVIDER_SITE_OTHER): Payer: BC Managed Care – PPO | Admitting: Rehabilitative and Restorative Service Providers"

## 2017-12-17 ENCOUNTER — Encounter: Payer: Self-pay | Admitting: Family Medicine

## 2017-12-17 ENCOUNTER — Ambulatory Visit (INDEPENDENT_AMBULATORY_CARE_PROVIDER_SITE_OTHER): Payer: BC Managed Care – PPO | Admitting: Family Medicine

## 2017-12-17 VITALS — BP 132/75 | HR 87 | Ht 61.5 in | Wt 193.0 lb

## 2017-12-17 DIAGNOSIS — R600 Localized edema: Secondary | ICD-10-CM

## 2017-12-17 DIAGNOSIS — F901 Attention-deficit hyperactivity disorder, predominantly hyperactive type: Secondary | ICD-10-CM

## 2017-12-17 DIAGNOSIS — K2100 Gastro-esophageal reflux disease with esophagitis, without bleeding: Secondary | ICD-10-CM

## 2017-12-17 DIAGNOSIS — Z1283 Encounter for screening for malignant neoplasm of skin: Secondary | ICD-10-CM

## 2017-12-17 DIAGNOSIS — M25512 Pain in left shoulder: Secondary | ICD-10-CM

## 2017-12-17 DIAGNOSIS — M25551 Pain in right hip: Secondary | ICD-10-CM | POA: Diagnosis not present

## 2017-12-17 DIAGNOSIS — K449 Diaphragmatic hernia without obstruction or gangrene: Secondary | ICD-10-CM | POA: Diagnosis not present

## 2017-12-17 DIAGNOSIS — G4701 Insomnia due to medical condition: Secondary | ICD-10-CM | POA: Diagnosis not present

## 2017-12-17 DIAGNOSIS — M6281 Muscle weakness (generalized): Secondary | ICD-10-CM | POA: Diagnosis not present

## 2017-12-17 DIAGNOSIS — R29898 Other symptoms and signs involving the musculoskeletal system: Secondary | ICD-10-CM

## 2017-12-17 DIAGNOSIS — R7301 Impaired fasting glucose: Secondary | ICD-10-CM

## 2017-12-17 DIAGNOSIS — G8929 Other chronic pain: Secondary | ICD-10-CM

## 2017-12-17 LAB — POCT GLYCOSYLATED HEMOGLOBIN (HGB A1C): Hemoglobin A1C: 5.9 % — AB (ref 4.0–5.6)

## 2017-12-17 NOTE — Therapy (Addendum)
[place], [place], [address] Phone: [phone]   Fax:  [phone]  Physical Therapy Treatment  Patient Details  Name: [name] MRN: [id number] Date of Birth: 1950-09-27 Referring Provider (PT): Dr Lynne Leader - hip; Dr Victorino December - shoulder    Encounter Date: 12/17/2017  PT End of Session - 12/17/17 0913    Visit Number  23    Number of Visits  24    Date for PT Re-Evaluation  01/04/18    PT Start Time  8242    PT Stop Time  1012    PT Time Calculation (min)  78 min    Activity Tolerance  Patient tolerated treatment well       Past Medical History:  Diagnosis Date  . ADD (attention deficit disorder)   . Arthritis    "bunion on  right" (08/11/2014)  . Chronic bronchitis ([place])    "plenty; but not q yr" (08/11/2014)  . Depression   . Dyslipidemia   . Environmental allergies   . GERD (gastroesophageal reflux disease)   . Hearing difficulty of left ear    "grew up w/deaf parent; find myself lip reading more recently" (08/11/2014)  . Hepatitis    "exposed in college; rec'd gamma globulin; 6 months later S/S (weakness, lethargy, fevers); ; dx'd infectious hepatitis"  . History of herpes genitalis    "stress induced reoccurance that shows up on my left middle finger" (08/11/2014)  . History of hiatal hernia   . History of stomach ulcers   . Hypercholesterolemia   . Hyperthyroidism 1994   "postpartum only; resolved itself"  . Incisional hernia   . Internal hemorrhoids   . Leg cramping   . Osteopenia   . Seasonal asthma   . Stress incontinence, female   . Ulcer     Past Surgical History:  Procedure Laterality Date  . CESAREAN SECTION  1994  . CHALAZION EXCISION Left ~ 2013  . DILATION AND CURETTAGE OF UTERUS  X 2  . EYE SURGERY    . HERNIA REPAIR  08/11/2014  . INCISIONAL HERNIA REPAIR N/A 08/11/2014   Procedure: LAPAROSCOPIC INCISIONAL HERNIA REPAIR;  Surgeon: Coralie Keens,  MD;  Location: [facility];  Service: General;  Laterality: N/A;  . INCONTINENCE SURGERY  2005  . INGUINAL HERNIA REPAIR Left 08/11/2014  . INGUINAL HERNIA REPAIR Left 08/11/2014   Procedure: LEFT INGUINAL HERNIA REPAIR;  Surgeon: Coralie Keens, MD;  Location: [facility];  Service: General;  Laterality: Left;  . INSERTION OF MESH Left 08/11/2014   Procedure: INSERTION OF MESH TO LEFT GROIN AND ABDOMEN;  Surgeon: Coralie Keens, MD;  Location: [facility];  Service: General;  Laterality: Left;  . LAPAROSCOPIC INCISIONAL / UMBILICAL / VENTRAL HERNIA REPAIR  08/11/2014   IHR  . NASAL POLYP EXCISION  2014  . REPAIR OF PERFORATED ULCER  1985  . TONSILLECTOMY    . TOTAL SHOULDER ARTHROPLASTY Left 09/21/2017   Procedure: LEFT TOTAL SHOULDER ARTHROPLASTY;  Surgeon: Nicholes Stairs, MD;  Location: [facility];  Service: Orthopedics;  Laterality: Left;    There were no vitals filed for this visit.  Subjective Assessment - 12/17/17 0910    Subjective  Cleaned out her panrty and did some plumbing work changing out her toilet lid. She did well. She is doing well. She is working on [employer]. Doing well.     Currently in Pain?  No/denies    Pain Location  Buttocks  Pain Orientation  Right    Pain Descriptors / Indicators  Aching;Sore    Pain Type  Chronic pain;Surgical pain    Multiple Pain Sites  No    Pain Location  Shoulder    Pain Orientation  Left                       [facility] Adult PT Treatment/Exercise - 12/17/17 0001      Elbow Exercises   Elbow Flexion  Left;10 reps    Bar Weights/Barbell (Elbow Flexion)  4 lbs    Elbow Flexion Limitations  2nd set with mini wall slide x 10       Lumbar Exercises: Stretches   Quad Stretch  Right;Left;30 seconds   prone with strap   Piriformis Stretch  Right;Left;3 reps;30 seconds    Piriformis Stretch Limitations  seated      Lumbar Exercises: Aerobic   Nustep  L5 L/UE's (arm 10) x 7 min      Lumbar Exercises: Standing   Wall Slides  10 reps;5  seconds   ball at wall     Lumbar Exercises: Supine   Bridge  10 reps;5 seconds      Knee/Hip Exercises: Standing   Forward Step Up  Right;Left;1 set;Step Height: 6";5 reps   pain Lt knee     Shoulder Exercises: Standing   External Rotation  Strengthening;Right;20 reps;Theraband   side steps holding elbow 90 deg flexion shd adducted   Theraband Level (Shoulder External Rotation)  Level 3 (Green)    Internal Rotation  Strengthening;Left;20 reps;Theraband   side steps holding UE in elbow flexion/shd adduction at side   Theraband Level (Shoulder Internal Rotation)  Level 3 (Green)    Flexion  AROM;Strengthening;Left;Right;15 reps;Weights    Shoulder Flexion Weight (lbs)  2    ABduction  AROM;Strengthening;Right;Left;Weights;10 reps   scaption    Shoulder ABduction Weight (lbs)  2    Extension  Strengthening;Both;20 reps;Theraband    Theraband Level (Shoulder Extension)  Level 3 (Green)    Row  Strengthening;Both;20 reps;Theraband    Theraband Level (Shoulder Row)  Level 3 (Green)    Other Standing Exercises  wall push up x 10 reps; reverse wall push up x10    Other Standing Exercises  rings over and back head height 10-12 rings moving Rt to Lt then Lt to Rt x 1 rep       Shoulder Exercises: Pulleys   Flexion  2 minutes    Scaption  2 minutes      Shoulder Exercises: Therapy Ball   Other Therapy Ball Exercises  scapular depression pressing into ball at side - shoulder in neutral position at side 5 sec hold 10 reps x 2 sets     Other Therapy Ball Exercises  shoulder flexion with ball on wall ~ 110-115 deg flexion holding hand on wall moving hands on ball 30-60 sec x 3 reps       Shoulder Exercises: ROM/Strengthening   UBE (Upper Arm Bike)  L1: 2 min forward/ 35min backward       Shoulder Exercises: Isometric Strengthening   External Rotation Limitations  isometric hold for step in x 10 green TB     Internal Rotation Limitations  isometric hold for step out x 10 green TB        Moist Heat Therapy   Number Minutes Moist Heat  15 Minutes    Moist Heat Location  --   bilat thigh  Cryotherapy   Number Minutes Cryotherapy  15 Minutes    Cryotherapy Location  Hip   bilat    Type of Cryotherapy  Ice pack      Vasopneumatic   Number Minutes Vasopneumatic   15 minutes    Vasopnuematic Location   Shoulder    Vasopneumatic Pressure  Low    Vasopneumatic Temperature   34 deg                   PT Long Term Goals - 11/26/17 1714      PT LONG TERM GOAL #1   Title  Decrease pain in the Rt posterior hip by 50-75% allowing patient to participate in functional activities with minimal dysfunction or limitations 01/04/18    Time  12    Period  Weeks    Status  Revised      PT LONG TERM GOAL #2   Title  Improve hip mobility Rt to equal Lt 01/04/18    Time  12    Period  Weeks    Status  Revised      PT LONG TERM GOAL #3   Title  Independent in HEP 01/04/18    Time  12    Period  Weeks    Status  Revised      PT LONG TERM GOAL #4   Title  Improve/maintain FOTO to </= 40% limitation 01/04/18    Time  12    Period  Weeks    Status  Revised      PT LONG TERM GOAL #5   Title  improve Lt shoulder ROM to WFL's and within 5-10 deg of AROM Rt shoulder 01/04/18    Time  12    Period  Weeks    Status  Revised      PT LONG TERM GOAL #6   Title  Increase strength Lt UE to 4/5 to 4+/5  throughout 01/04/18    Time  12    Period  Weeks    Status  Revised      PT LONG TERM GOAL #7   Title  Patient to return to normal functional activities using Lt UE 01/04/18    Time  12    Period  Weeks    Status  Revised            Plan - 12/17/17 0915    Clinical Impression Statement  Continued progress with ROM; strength; functional abilities Lt shoulder/UE. Hip and LE pain is intermittent and may be related to new exercises for pelvic floor dysfunction. Working toward d/c.     PT Frequency  2x / week    PT Duration  6 weeks    PT Treatment/Interventions   Patient/family education;ADLs/Self Care Home Management;Cryotherapy;Electrical Stimulation;Iontophoresis 4mg /ml Dexamethasone;Moist Heat;Ultrasound;Dry needling;Manual techniques;Neuromuscular re-education;Therapeutic activities;Therapeutic exercise    PT Next Visit Plan  Progress Lt shoulder rehab per TSA protocol.  continue core/LE strengthening. Will continue 2x/wk this week then place patient on hold pending MD visit vs one additional visit prior to MD visit 01/04/18. Anticipate d/c  to independent HEP at that time.     Consulted and Agree with Plan of Care  Patient       Patient will benefit from skilled therapeutic intervention in order to improve the following deficits and impairments:  Postural dysfunction, Improper body mechanics, Pain, Increased fascial restricitons, Increased muscle spasms, Decreased mobility, Decreased range of motion, Decreased activity tolerance  Visit Diagnosis: Chronic left shoulder pain  Muscle  weakness (generalized)  Localized edema  Pain in right hip  Other symptoms and signs involving the musculoskeletal system     Problem List Patient Active Problem List   Diagnosis Date Noted  . Chronic musculoskeletal pain 11/13/2017  . 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  . IFG (impaired fasting glucose) 08/03/2017  . Primary osteoarthritis of both knees 07/29/2017  . Osteoarthritis, localized, shoulder, left 07/29/2017  . Sinusitis, chronic   . Asthma 01/08/2012  . Dyslipidemia   . ADD (attention deficit disorder)   . Depression   . Osteopenia   . Incisional hernia   . Stress incontinence, female     Everardo All PT, MPH 12/17/2017, 10:45 AM  [facility] [place], [place], [address] Phone: [phone]   Fax:  [phone]  Name: [name] MRN: [id number] Date of Birth: [birth year]

## 2017-12-17 NOTE — Progress Notes (Signed)
Subjective:    PI:RJJOACZY concerns.     HPI:  Impaired fasting glucose-no increased thirst or urination. No symptoms consistent with hypoglycemia.  Her  Dermatology appt is not until February.  She wants to know if she can get in somewhere sooner.    She hasn't been sleeping well with her inc urination ans she has been getting intense leg pain in both anterior thighs. Notices it more right after she gets up from laying down and when bending over to get things about of the fridge.   She did speak to her orth about his and they are getting an xray of her low back tomorrow.  They think the leg pain could be coming from her back even though she is ot having any major back pain.    ADD - Reports symptoms are well controlled on current regime. Denies any problems with insomnia, chest pain, palpitations, or SOB.    Osteoarthritis-for her joint pain she wanted to let me know that she is been taking MSM.  She said when she runs out she is actually can switch back to glucosamine/chondroitin.  Shoulder pain status post surgery-she is doing much better and is actually completed physical therapy they are going to release her.  She is primary just using Tylenol as needed for pain.  She still has some decreased range of motion, but overall is happy with the results.  She was upset that she was not called for her GI referral.  Referral was placed 4 weeks ago and says she had to call on her own on Friday to see why they had not scheduled her an appointment.  Past medical history, Surgical history, Family history not pertinant except as noted below, Social history, Allergies, and medications have been entered into the medical record, reviewed, and corrections made.   Review of Systems: No fevers, chills, night sweats, weight loss, chest pain, or shortness of breath.   Objective:    General: Well Developed, well nourished, and in no acute distress.  Neuro: Alert and oriented x3, extra-ocular muscles intact,  sensation grossly intact.  HEENT: Normocephalic, atraumatic  Skin: Warm and dry, no rashes. Cardiac: Regular rate and rhythm, no murmurs rubs or gallops, no lower extremity edema.  Respiratory: Clear to auscultation bilaterally. Not using accessory muscles, speaking in full sentences.   Impression and Recommendations:   IFG - Well controlled. Continue current regimen. Follow up in  6 months.    Lab Results  Component Value Date   HGBA1C 5.9 (A) 12/17/2017    Insomnia -at this point I do not want to look at any type of sleep aids etc. until the urinary frequency and pain improves which is the major cause of her difficulty with sleep right now.  She is currently enrolled in physical therapy with her urology and she has an x-ray with Ortho tomorrow.  ADD -at some point she would like to maybe consider trying a different type of stimulant but says right now she is okay with what she is using.  She is also used and vitamin salts in the past.  Osteoarthritis-recommended a trial of turmeric.  GI referral-she is scheduled for early December did encourage her to call to see if they have a cancellation and see if she can get in sooner.  She will be traveling out of town in December and wants to really try to get it done before then.

## 2017-12-20 ENCOUNTER — Other Ambulatory Visit: Payer: Self-pay | Admitting: Orthopedic Surgery

## 2017-12-20 ENCOUNTER — Encounter: Payer: BC Managed Care – PPO | Admitting: Physical Therapy

## 2017-12-20 DIAGNOSIS — M545 Low back pain, unspecified: Secondary | ICD-10-CM

## 2017-12-20 DIAGNOSIS — G8929 Other chronic pain: Secondary | ICD-10-CM

## 2017-12-21 ENCOUNTER — Ambulatory Visit (INDEPENDENT_AMBULATORY_CARE_PROVIDER_SITE_OTHER): Payer: BC Managed Care – PPO | Admitting: Physical Therapy

## 2017-12-21 DIAGNOSIS — M6281 Muscle weakness (generalized): Secondary | ICD-10-CM

## 2017-12-21 DIAGNOSIS — M25512 Pain in left shoulder: Secondary | ICD-10-CM

## 2017-12-21 DIAGNOSIS — R600 Localized edema: Secondary | ICD-10-CM

## 2017-12-21 DIAGNOSIS — G8929 Other chronic pain: Secondary | ICD-10-CM

## 2017-12-21 DIAGNOSIS — M25551 Pain in right hip: Secondary | ICD-10-CM

## 2017-12-21 DIAGNOSIS — R29898 Other symptoms and signs involving the musculoskeletal system: Secondary | ICD-10-CM

## 2017-12-21 NOTE — Therapy (Addendum)
[facility] [place], [place], [address] Phone: [phone]   Fax:  [phone]  Physical Therapy Treatment  Patient Details  Name: [name] MRN: [id number] Date of Birth: Nov 26, 1950 Referring Provider (PT): Dr Lynne Leader - hip; Dr Victorino December - shoulder    Encounter Date: 12/21/2017  PT End of Session - 12/21/17 0815    Visit Number  24    Number of Visits  24    Date for PT Re-Evaluation  01/04/18    PT Start Time  9150   pt arrived late   PT Stop Time  0920    PT Time Calculation (min)  66 min    Activity Tolerance  Patient tolerated treatment well;No increased pain    Behavior During Therapy  WFL for tasks assessed/performed       Past Medical History:  Diagnosis Date  . ADD (attention deficit disorder)   . Arthritis    "bunion on  right" (08/11/2014)  . Chronic bronchitis ([place])    "plenty; but not q yr" (08/11/2014)  . Depression   . Dyslipidemia   . Environmental allergies   . GERD (gastroesophageal reflux disease)   . Hearing difficulty of left ear    "grew up w/deaf parent; find myself lip reading more recently" (08/11/2014)  . Hepatitis    "exposed in college; rec'd gamma globulin; 6 months later S/S (weakness, lethargy, fevers); ; dx'd infectious hepatitis"  . History of herpes genitalis    "stress induced reoccurance that shows up on my left middle finger" (08/11/2014)  . History of hiatal hernia   . History of stomach ulcers   . Hypercholesterolemia   . Hyperthyroidism 1994   "postpartum only; resolved itself"  . Incisional hernia   . Internal hemorrhoids   . Leg cramping   . Osteopenia   . Seasonal asthma   . Stress incontinence, female   . Ulcer     Past Surgical History:  Procedure Laterality Date  . CESAREAN SECTION  1994  . CHALAZION EXCISION Left ~ 2013  . DILATION AND CURETTAGE OF UTERUS  X 2  . EYE SURGERY    . HERNIA REPAIR  08/11/2014  . INCISIONAL HERNIA  REPAIR N/A 08/11/2014   Procedure: LAPAROSCOPIC INCISIONAL HERNIA REPAIR;  Surgeon: Coralie Keens, MD;  Location: [facility];  Service: General;  Laterality: N/A;  . INCONTINENCE SURGERY  2005  . INGUINAL HERNIA REPAIR Left 08/11/2014  . INGUINAL HERNIA REPAIR Left 08/11/2014   Procedure: LEFT INGUINAL HERNIA REPAIR;  Surgeon: Coralie Keens, MD;  Location: [facility];  Service: General;  Laterality: Left;  . INSERTION OF MESH Left 08/11/2014   Procedure: INSERTION OF MESH TO LEFT GROIN AND ABDOMEN;  Surgeon: Coralie Keens, MD;  Location: [facility];  Service: General;  Laterality: Left;  . LAPAROSCOPIC INCISIONAL / UMBILICAL / VENTRAL HERNIA REPAIR  08/11/2014   IHR  . NASAL POLYP EXCISION  2014  . REPAIR OF PERFORATED ULCER  1985  . TONSILLECTOMY    . TOTAL SHOULDER ARTHROPLASTY Left 09/21/2017   Procedure: LEFT TOTAL SHOULDER ARTHROPLASTY;  Surgeon: Nicholes Stairs, MD;  Location: [facility];  Service: Orthopedics;  Laterality: Left;    There were no vitals filed for this visit.  Subjective Assessment - 12/21/17 0816    Subjective  "I have a new development. I have scoliosis." Pt presented pictures of her latest xray of her back.  Pt reports her pelvic floor exercises were agrivating her piriformis  muscle; has modified her exercises.  Overall she is happy with her progress.     Patient Stated Goals  get rid of the hip pain; get shoulder working again     Currently in Pain?  No/denies    Pain Score  0-No pain         [facility] PT Assessment - 12/21/17 0001      Assessment   Medical Diagnosis  Rt posterior hip pain; Lt TSA     Referring Provider (PT)  Dr Lynne Leader - hip; Dr Victorino December - shoulder     Onset Date/Surgical Date  08/13/17    Hand Dominance  Right    Next MD Visit  Dr Victorino December 01/04/18      AROM   Right/Left Shoulder  Left    Right Shoulder Extension  54 Degrees    Right Shoulder Flexion  150 Degrees    Right Shoulder ABduction  153 Degrees    Right Shoulder External  Rotation  90 Degrees    Left Shoulder Extension  40 Degrees    Left Shoulder Flexion  135 Degrees    Left Shoulder ABduction  124 Degrees   some scapular hiking   Left Shoulder External Rotation  85 Degrees      Strength   Right/Left Shoulder  Left    Left Shoulder Flexion  3+/5    Left Shoulder Extension  5/5    Left Shoulder ABduction  3+/5    Left Shoulder Internal Rotation  4/5    Left Shoulder External Rotation  4/5      [facility] Adult PT Treatment/Exercise - 12/21/17 0001      Lumbar Exercises: Supine   Clam  10 reps;2 seconds   alternate knees - green theraband    Bridge  10 reps;5 seconds   with green band around thighs, core engaged     Shoulder Exercises: Supine   Other Supine Exercises  sash with LUE with yellow band x 20      Shoulder Exercises: Standing   Other Standing Exercises  D1 ext and flex with red band x 10 each (LUE)    Other Standing Exercises  rings over and back medium height 10-12 rings moving Rt to Lt then Lt to Rt x 1 rep       Shoulder Exercises: Pulleys   Flexion  2 minutes    Scaption  2 minutes      Shoulder Exercises: ROM/Strengthening   UBE (Upper Arm Bike)  L1: 2 min forward/ 40mn backward       Shoulder Exercises: Isometric Strengthening   External Rotation Limitations  isometric hold for step in x 15 green TB     Internal Rotation Limitations  isometric hold for step out x 15 green TB       Shoulder Exercises: Stretch   Other Shoulder Stretches  low doorway stretch x 20 sec x 3 reps       Cryotherapy   Number Minutes Cryotherapy  15 Minutes    Cryotherapy Location  Hip   bilat    Type of Cryotherapy  Ice pack      Vasopneumatic   Number Minutes Vasopneumatic   15 minutes    Vasopnuematic Location   Shoulder    Vasopneumatic Pressure  Low    Vasopneumatic Temperature   34 deg       Verbally reviewed HEP; pt verbalized understanding of exercises to continue performing.     PT Long Term Goals -  12/21/17 0822      PT LONG  TERM GOAL #1   Title  Decrease pain in the Rt posterior hip by 50-75% allowing patient to participate in functional activities with minimal dysfunction or limitations 01/04/18    Baseline  85% reduction of pain.     Time  12    Period  Weeks    Status  Achieved      PT LONG TERM GOAL #2   Title  Improve hip mobility Rt to equal Lt 01/04/18    Time  12    Period  Weeks    Status  On-going      PT LONG TERM GOAL #3   Title  Independent in HEP 01/04/18    Time  12    Period  Weeks    Status  On-going      PT LONG TERM GOAL #4   Title  Improve/maintain FOTO to </= 40% limitation 01/04/18    Time  12    Period  Weeks    Status  On-going      PT LONG TERM GOAL #5   Title  improve Lt shoulder ROM to WFL's and within 5-10 deg of AROM Rt shoulder 01/04/18    Time  12    Period  Weeks    Status  On-going      PT LONG TERM GOAL #6   Title  Increase strength Lt UE to 4/5 to 4+/5  throughout 01/04/18    Time  12    Period  Weeks    Status  Partially Met      PT LONG TERM GOAL #7   Title  Patient to return to normal functional activities using Lt UE 01/04/18    Time  12    Period  Weeks    Status  Achieved            Plan - 12/21/17 0177    Clinical Impression Statement  Pt demonstrated improved Lt shoulder strength and ROM.  She is reporting greater ease with LUE and less pain in her hip.  She has partially met her goals.  Pt is agreeable to hold therapy until follow up appt with MD in early Dec.     Rehab Potential  Good    PT Frequency  2x / week    PT Duration  6 weeks    PT Treatment/Interventions  Patient/family education;ADLs/Self Care Home Management;Cryotherapy;Electrical Stimulation;Iontophoresis 6m/ml Dexamethasone;Moist Heat;Ultrasound;Dry needling;Manual techniques;Neuromuscular re-education;Therapeutic activities;Therapeutic exercise    PT Next Visit Plan  patient on hold pending MD visit 01/04/18. Anticipate d/c  to independent HEP at that time.     Consulted and  Agree with Plan of Care  Patient       Patient will benefit from skilled therapeutic intervention in order to improve the following deficits and impairments:  Postural dysfunction, Improper body mechanics, Pain, Increased fascial restricitons, Increased muscle spasms, Decreased mobility, Decreased range of motion, Decreased activity tolerance  Visit Diagnosis: Chronic left shoulder pain  Muscle weakness (generalized)  Localized edema  Pain in right hip  Other symptoms and signs involving the musculoskeletal system     Problem List Patient Active Problem List   Diagnosis Date Noted  . Chronic musculoskeletal pain 11/13/2017  . 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  . IFG (impaired fasting glucose) 08/03/2017  . Primary osteoarthritis of both knees 07/29/2017  . Osteoarthritis, localized, shoulder, left 07/29/2017  . Sinusitis, chronic   . Asthma 01/08/2012  .  Dyslipidemia   . ADD (attention deficit disorder)   . Depression   . Osteopenia   . Incisional hernia   . Stress incontinence, female    Kerin Perna, [place] 12/21/17 9:58 AM  [place], [place], [address] Phone: [phone]   Fax:  [phone]  Name: [name] MRN: [id number] Date of Birth: 1950-02-26  PHYSICAL THERAPY DISCHARGE SUMMARY  Visits from Start of Care: 24  Current functional level related to goals / functional outcomes: See last progress note for discharge status   Remaining deficits: Needs to continue to work consistently on HEP and be active   Education / Equipment: Extensive HEP for hip and shoulder  Plan: Patient agrees to discharge.  Patient goals were not met. Patient is being discharged due to meeting the stated rehab goals.  ?????     Celyn P. Helene Kelp PT, MPH 01/10/18 3:48 PM

## 2017-12-24 ENCOUNTER — Encounter: Payer: BC Managed Care – PPO | Admitting: Rehabilitative and Restorative Service Providers"

## 2017-12-24 ENCOUNTER — Encounter: Payer: Self-pay | Admitting: Nurse Practitioner

## 2017-12-24 ENCOUNTER — Encounter

## 2017-12-24 ENCOUNTER — Ambulatory Visit (INDEPENDENT_AMBULATORY_CARE_PROVIDER_SITE_OTHER): Payer: BC Managed Care – PPO | Admitting: Nurse Practitioner

## 2017-12-24 VITALS — BP 110/78 | HR 80 | Ht 61.0 in | Wt 193.2 lb

## 2017-12-24 DIAGNOSIS — R142 Eructation: Secondary | ICD-10-CM

## 2017-12-24 DIAGNOSIS — R194 Change in bowel habit: Secondary | ICD-10-CM | POA: Diagnosis not present

## 2017-12-24 DIAGNOSIS — R1010 Upper abdominal pain, unspecified: Secondary | ICD-10-CM | POA: Diagnosis not present

## 2017-12-24 MED ORDER — NA SULFATE-K SULFATE-MG SULF 17.5-3.13-1.6 GM/177ML PO SOLN: ORAL | 0 refills | Status: DC

## 2017-12-24 NOTE — Patient Instructions (Signed)
If you are age 67 or older, your body mass index should be between 23-30. Your Body mass index is 36.5 kg/m. If this is out of the aforementioned range listed, please consider follow up with your Primary Care Provider.  If you are age 63 or younger, your body mass index should be between 19-25. Your Body mass index is 36.5 kg/m. If this is out of the aformentioned range listed, please consider follow up with your Primary Care Provider.   You have been scheduled for an endoscopy and colonoscopy. Please follow the written instructions given to you at your visit today. Please pick up your prep supplies at the pharmacy within the next 1-3 days. If you use inhalers (even only as needed), please bring them with you on the day of your procedure. Your physician has requested that you go to www.startemmi.com and enter the access code given to you at your visit today. This web site gives a general overview about your procedure. However, you should still follow specific instructions given to you by our office regarding your preparation for the procedure.  We have sent the following medications to your pharmacy for you to pick up at your convenience: Suprep  Thank you for choosing me and [facility].   Tye Savoy, NP

## 2017-12-25 ENCOUNTER — Telehealth: Payer: Self-pay | Admitting: *Deleted

## 2017-12-25 ENCOUNTER — Encounter: Payer: BC Managed Care – PPO | Admitting: Physical Therapy

## 2017-12-25 NOTE — Telephone Encounter (Signed)
Dr. Paulla Dolly returned her call.

## 2017-12-25 NOTE — Telephone Encounter (Signed)
"   Print Notes   Date  Time  Staff  Action  12/25/2017  11:48:30  Rhetta Mura  Progress Note, cancelled, emailed cs  Patient wants to be rescheduled. Can you have the office call her today? She wants to try to get back in before the end of the year.. Thanks,  Rhetta Mura, RN, BSN  Pre Anesthesia Assessment Nurse [facility]  I received a message from the surgical center that you wanted to reschedule your surgery.  Do you have a date in mind? "I guess Dr. Paulla Dolly didn't inform you.  I had told him I needed to schedule some other procedures so I would need to hold off on having this surgery until I got the dates for the other two.  I have a procedure scheduled on December 10 and a Colonoscopy scheduled for December 23.  When can he do my surgery?  I need to have it done by the end of the year."  He can't do it on December 24 because that will be anesthesia two days in a role.  "I figured that, that's what Dr. Collene Mares had told me."  He can possibly do it on December 16 at lunch time.  I would have to see if it's okay with Dr. Paulla Dolly.  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 Paulla Dolly will be out of the office the last few days of December.  "I didn't realize it gets so busy during this time of the year.  How about December 3, can he do it then?"  That is the date we already have you scheduled for, that's next week.  You said you could not do it then.  "Oh, that is right.  I'll be coming back in town by train and I won't get here until midnight the night before.  Do you think it would be okay to have it the following day?"  Yes, I think it will be fine.  "I better not do that because I have the other surgery on December 10.  Put me down tentatively for December 16 and ask Dr. Paulla Dolly if he can do it then.  I will call my doctor and see if he can extend my leave and I will ask  him if it's okay to do it then.  I'll give you a call back on Monday, December 3, how about that?"  Okay I will put you down tentatively for December 16.

## 2017-12-26 NOTE — Telephone Encounter (Signed)
I will reschedule her postop appointments once the surgery date is confirmed.

## 2017-12-26 NOTE — Telephone Encounter (Signed)
Postop appointments are getting rescheduled.

## 2017-12-31 ENCOUNTER — Encounter: Payer: Self-pay | Admitting: Nurse Practitioner

## 2017-12-31 NOTE — Progress Notes (Signed)
ASSESSMENT      PLAN:    18. 67 yo female with remote hx of PUD / perforation. No details available. She has had similar stabbing upper abdominal pain for several months. Pain much better but not resolved on PPI.  -Doubt recurrent ulcer but she is very anxious about the possibility -doesn't use nsaids.  -Patient will be scheduled for EGD for further evaluation. The risks and benefits of EGD were discussed and the patient agrees to proceed.   2. Colon cancer screening. Last colonoscopy in 2009. Some recent bowel changes but no blood in stool, anemia, unexplained weight loss or other concerning features -The risks and benefits of colonoscopy with possible polypectomy were discussed and the patient agrees to proceed.   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 Patient gives a hx of a perforated gastric ulcer in 1985. She says H.pylori was negative and she wasn't taking NSAIDS. In mid 2000's she had similar pain, underwent EGD but no recurrent ulcers found. She has had the same intermittent stabbing upper abdominal pain for months. Started Nexium several months ago and it has helped the pain but she still gets it occasionally. She has very loud intestinal gurgling at times. People standing beside her can hear the gurgling.  She also complains of excessive belching. She had left should surgery and says that pre-op xrays picked up a hiatal hernia. No nausea. No significant GERD symptoms.   In additional to the abdominal pain described above patient has experienced some bowel changes. She used to have normal bowel movements but over the last 6 months has been having frequent problems with  straining and passage of "pellet' type stools. She took pain meds for two weeks following shoulder surgery in Aug but none since. No rectal bleeding. She started  Mybetriq a month ago but no med changes prior to onset of constipation. [place]  Data reviewed:  Labs 11/13/17 Normal CBC - hgb 13.7 BMET normal except mild elevated in glucose hgb A1c 5.9 on 12/17/17   Past Medical History:  Diagnosis Date  . ADD (attention deficit disorder)   . Arthritis    "bunion on  right" (08/11/2014)  . Chronic bronchitis ([place])    "plenty; but not q yr" (08/11/2014)  . Depression   . Dyslipidemia   . Environmental allergies   . GERD (gastroesophageal reflux disease)   . Hearing difficulty of left ear    "grew up w/deaf parent; find myself lip reading more recently" (08/11/2014)  . Hepatitis    "exposed in college; rec'd gamma globulin; 6 months later S/S (weakness, lethargy, fevers); ; dx'd infectious hepatitis"  . History of herpes genitalis    "stress induced reoccurance that shows up on my left middle finger" (08/11/2014)  . History of hiatal hernia   . History of stomach ulcers   . Hypercholesterolemia   . Hyperthyroidism 1994   "postpartum only; resolved itself"  . Incisional hernia   . Internal hemorrhoids   . Leg cramping   . Osteopenia   . Seasonal asthma   . Stress incontinence, female   . Ulcer      Past Surgical History:  Procedure Laterality Date  . CESAREAN SECTION  1994  .  CHALAZION EXCISION Left ~ 2013  . DILATION AND CURETTAGE OF UTERUS  X 2  . EYE SURGERY    . HERNIA REPAIR  08/11/2014  . INCISIONAL HERNIA REPAIR N/A 08/11/2014   Procedure: LAPAROSCOPIC INCISIONAL HERNIA REPAIR;  Surgeon: Coralie Keens, MD;  Location: [facility];  Service: General;  Laterality: N/A;  . INCONTINENCE SURGERY  2005  . INGUINAL HERNIA REPAIR Left 08/11/2014  . INGUINAL HERNIA REPAIR Left 08/11/2014   Procedure: LEFT INGUINAL HERNIA REPAIR;  Surgeon: Coralie Keens, MD;  Location: [facility];  Service: General;  Laterality: Left;  . INSERTION OF MESH Left 08/11/2014   Procedure: INSERTION OF MESH TO LEFT GROIN AND  ABDOMEN;  Surgeon: Coralie Keens, MD;  Location: [facility];  Service: General;  Laterality: Left;  . LAPAROSCOPIC INCISIONAL / UMBILICAL / VENTRAL HERNIA REPAIR  08/11/2014   [facility]  . NASAL POLYP EXCISION  2014  . REPAIR OF PERFORATED ULCER  1985  . TONSILLECTOMY    . TOTAL SHOULDER ARTHROPLASTY Left 09/21/2017   Procedure: LEFT TOTAL SHOULDER ARTHROPLASTY;  Surgeon: Nicholes Stairs, MD;  Location: [facility];  Service: Orthopedics;  Laterality: Left;   Family History  Problem Relation Age of Onset  . Arthritis Mother   . Heart disease Mother        CABG @ 73  . Diabetes Mother   . Osteoporosis Mother   . Hyperlipidemia Mother   . Arthritis Father   . Lymphoma Father 19       chemo  . Cancer Father        lymphoma  . Lymphoma Paternal Grandmother   . Diabetes Maternal Grandmother    Social History   Tobacco Use  . Smoking status: Never Smoker  . Smokeless tobacco: Never Used  Substance Use Topics  . Alcohol use: Yes    Alcohol/week: 2.0 standard drinks    Types: 1 Glasses of wine, 1 Shots of liquor per week    Comment: 08/11/2014 "0-2 glasses of wine or a mixed drink q wk"  . Drug use: No   Current Outpatient Medications  Medication Sig Dispense Refill  . acetaminophen (TYLENOL) 650 MG CR tablet Take 1,300 mg by mouth See admin instructions. Take 1,300 mg by mouth at bedtime and may also take an additional 1,300 mg one to two times a day as needed for pain    . albuterol (PROAIR HFA) 108 (90 Base) MCG/ACT inhaler Inhale 2 puffs into the lungs every 6 (six) hours as needed for wheezing or shortness of breath. 3 Inhaler 0  . amphetamine-dextroamphetamine (ADDERALL) 20 MG tablet Take 1 tablet (20 mg total) by mouth 2 (two) times daily as needed. 180 tablet 0  . cyclobenzaprine (FLEXERIL) 10 MG tablet Take 10 mg by mouth at bedtime as needed (for sleep or pain).     . DULoxetine (CYMBALTA) 30 MG capsule Take 30 mg by mouth every evening.    . DULoxetine (CYMBALTA) 60 MG capsule  Take 1 capsule (60 mg total) by mouth every morning. 90 capsule 3  . ipratropium (ATROVENT) 0.03 % nasal spray Place 2 sprays into the nose 4 (four) times daily. Use as needed for runny nose and congestion 30 mL 1  . loratadine (CLARITIN) 10 MG tablet Take 1 tablet (10 mg total) by mouth daily. 90 tablet 3  . Methylsulfonylmethane (MSM PO) Take 1 capsule by mouth See admin instructions. Take 1 capsule by mouth two to three times a day with food    . mirabegron  ER (MYRBETRIQ) 50 MG TB24 tablet Take 1 tablet (50 mg total) by mouth daily. 30 tablet 1  . mometasone (NASONEX) 50 MCG/ACT nasal spray INHALE 2 SPRAYS INTO THE NOSE DAILY AS NEEDED (Patient taking differently: Place 2 sprays into the nose daily as needed (for inflammation). ) 17 g 5  . Multiple Vitamins-Minerals (ONE-A-DAY WOMENS 50 PLUS) TABS Take 1 tablet by mouth daily.    . simvastatin (ZOCOR) 40 MG tablet Take 1 tablet (40 mg total) by mouth daily. (Patient taking differently: Take 40 mg by mouth at bedtime. ) 90 tablet 1  . UNABLE TO FIND Bone Strength [place] calcium tablets: Take 3 tablets by mouth daily    . esomeprazole (NEXIUM) 40 MG capsule Take 1 capsule (40 mg total) by mouth daily at 12 noon. (Patient taking differently: Take 40 mg by mouth daily. ) 90 capsule 1  . glucosamine-chondroitin 500-400 MG tablet Take 1 tablet by mouth 3 (three) times daily.    . Na Sulfate-K Sulfate-Mg Sulf 17.5-3.13-1.6 GM/177ML SOLN Suprep-Use as directed 354 mL 0   No current facility-administered medications for this visit.    Allergies  Allergen Reactions  . Aspirin Other (See Comments)    GI Intolerance; History of ulcers; GI BLEED  . Bee Venom Swelling    Any insects that bite or stings- swelling at site of sting or cellulitis can develop  . Nsaids Other (See Comments)    History of ulcers     Review of Systems: Positive for allery , arthritis, back pain, hearing problems, itching, sob, sleep problems, swelling of feet, urine  leakage. All other systems reviewed and negative except where noted in HPI.   Creatinine clearance cannot be calculated (Patient's most recent lab result is older than the maximum 21 days allowed.)   Physical Exam:    Wt Readings from Last 3 Encounters:  12/24/17 193 lb 3.2 oz (87.6 kg)  12/17/17 193 lb (87.5 kg)  11/13/17 192 lb (87.1 kg)    BP 110/78   Pulse 80   Ht 5\' 1"  (1.549 m)   Wt 193 lb 3.2 oz (87.6 kg)   LMP 08/30/2008   BMI 36.50 kg/m  Constitutional:  Pleasant female in no acute distress. Psychiatric: Normal mood and affect. Behavior is normal. EENT: Pupils normal.  Conjunctivae are normal. No scleral icterus. Neck supple.  Cardiovascular: Normal rate, regular rhythm. No edema Pulmonary/chest: Effort normal and breath sounds normal. No wheezing, rales or rhonchi. Abdominal: Soft, nondistended, nontender. Bowel sounds active throughout. There are no masses palpable. No hepatomegaly. Neurological: Alert and oriented to person place and time. Skin: Skin is warm and dry. No rashes noted.  Tye Savoy, NP  12/31/2017, 11:02 AM

## 2018-01-01 ENCOUNTER — Encounter: Payer: BC Managed Care – PPO | Admitting: Physical Therapy

## 2018-01-01 ENCOUNTER — Ambulatory Visit: Payer: BC Managed Care – PPO | Admitting: Gastroenterology

## 2018-01-01 NOTE — Progress Notes (Signed)
Agree with assessment and plan as outlined.  

## 2018-01-02 ENCOUNTER — Other Ambulatory Visit: Payer: BC Managed Care – PPO

## 2018-01-07 ENCOUNTER — Telehealth: Payer: Self-pay

## 2018-01-07 ENCOUNTER — Other Ambulatory Visit: Payer: BC Managed Care – PPO

## 2018-01-07 NOTE — Telephone Encounter (Signed)
NO, just hold meds for the 48 hours before the myelogram and then ok to hold cymbalta before colonoscopy if needed,  But I find that really unusual. We may need to call her GI to confirm.  I am not sure why she has to hold her statin for either procedure and why she has to hold her cymbalta before colonoscopy.

## 2018-01-07 NOTE — Telephone Encounter (Signed)
Called and advised pt that I received her msg and have sent this to Dr Madilyn Fireman. Pt states that for her Myelogram she only has to hold medication for 48 hours prior.. She was wanting to see if she could get in earlier for a cancellation and was planning to hold her meds from now until the 18th..  I advised pt to NOT hold her meds for longer than the required time for procedure. Pt agrees to continue medications until required hold time prior to procedures.   Pt still wants OK from Dr. Madilyn Fireman to make sure she can hold Cymbalta for 5 days prior to colonoscopy

## 2018-01-07 NOTE — Telephone Encounter (Signed)
Pt calls with some concerns about holding medications.  Pt states she has a myelogram scheduled for 12/18 and a colonoscopy scheduled for 12/23. Pt concerned because she states she was advised to stop her Cymbalta, Adderall, and Simvastatin for 5 days prior to both procedures. She is unsure about being off of her medications for this long.   Please advise if any recommendations for pt .[name]

## 2018-01-08 ENCOUNTER — Telehealth: Payer: Self-pay | Admitting: Gastroenterology

## 2018-01-08 ENCOUNTER — Other Ambulatory Visit: Payer: BC Managed Care – PPO

## 2018-01-08 NOTE — Telephone Encounter (Signed)
Reached out to pt, advised her that Dr Madilyn Fireman strongly agrees, she does NOT need to hold medications longer than required.   I am going to reach out to pt's GI and confirm when she is to stop Cymbalta for colonoscopy

## 2018-01-08 NOTE — Telephone Encounter (Signed)
The pt is not on any blood thinners and does not have any medications she needs to stop 5 days prior to her procedure.  She is aware and all questions answered.

## 2018-01-08 NOTE — Telephone Encounter (Signed)
Called and spoke with [name] at [facility], she confirmed with me that pt does NOT need to hold Cymbalta for 5 days prior.. Pt is able to take medications up until 12 noon the day of appt.   I have called and advised pt of this and recommended if she has any other questions concerning meds to hold for procedure, she should contact [facility]. Pt understands and very appreciative. No further needs at this time.    FYI to Dr Madilyn Fireman

## 2018-01-09 ENCOUNTER — Telehealth: Payer: Self-pay | Admitting: Podiatry

## 2018-01-09 NOTE — Telephone Encounter (Signed)
I explained to pt to the best of my ability that if her procedure on the 01/16/2018 required her to be off of certain medications and not up on her feet for more than 10 minutes/hour and the doctors performing the procedure on 01/16/2018 did not have a concern with the anesthesia Dr. Paulla Dolly was giving her on 01/14/2018, it would be fine to have the procedure on 01/14/2018, he could not perform surgery 01/18/2018. I told pt she would be in a surgical shoe post op and could not drive home. Pt states she will take Melburn Popper to the surgery center and have her dtr take an Melburn Popper to the surgery center to take her home. I told pt that sounded like a workable plan for her family.

## 2018-01-09 NOTE — Telephone Encounter (Signed)
I'm scheduled for surgery on Monday, 16 December with Dr. Paulla Dolly. I need to know the time of my surgery so I can coordinate with my daughter today as I cannot be released to ub[employer]. Please call me back when you get this message at [phone].

## 2018-01-09 NOTE — Telephone Encounter (Signed)
I called pt and told her I'm waiting to hear from Dr. Paulla Dolly if he can do her surgery on 24 December. Pt stated she would be able to then, although she is having a colonoscopy & endoscopy on Monday, 23 December. Pt wanted to know if she is able to keep her surgery on Monday, 16 December, how long would it be before she could drive. I told the pt once I heard from Dr. Paulla Dolly I would contact her back.

## 2018-01-09 NOTE — Telephone Encounter (Signed)
Hi [name] I left a message earlier and was told you are filling in for Delydia this week. I need you to call me back ASAP. I have another procedure scheduled on 18 December and I don't know if I will be able to have this procedure with Dr. Paulla Dolly on Monday, 16 December. I can be reached at [phone].

## 2018-01-09 NOTE — Telephone Encounter (Signed)
Called pt back to let her know Dr. Paulla Dolly could not do surgery on 24 December. Pt wants to know if there was any chance she can have the surgery before then but that she could not have any surgery on 19 December because she is having a procedure on 18 December where she cannot move for 24 hours. Pt wants to know if she should stop taking any medications prior to her surgery and also if she will be non weight baring for a certain amount of time. I told her I would send the nurse a message about that as I'm not clinical and could not say. I told the pt I would be back in touch with her about Friday 20 December for possible surgery or not.

## 2018-01-14 ENCOUNTER — Encounter: Payer: Self-pay | Admitting: Podiatry

## 2018-01-14 ENCOUNTER — Encounter: Payer: Self-pay | Admitting: Gastroenterology

## 2018-01-14 ENCOUNTER — Other Ambulatory Visit: Payer: Self-pay | Admitting: Podiatry

## 2018-01-14 ENCOUNTER — Telehealth: Payer: Self-pay | Admitting: Podiatry

## 2018-01-14 DIAGNOSIS — M2041 Other hammer toe(s) (acquired), right foot: Secondary | ICD-10-CM

## 2018-01-14 DIAGNOSIS — M7751 Other enthesopathy of right foot: Secondary | ICD-10-CM

## 2018-01-14 MED ORDER — HYDROCODONE-ACETAMINOPHEN 10-325 MG PO TABS: 1.0000 | ORAL_TABLET | Freq: Three times a day (TID) | ORAL | 0 refills | Status: DC | PRN

## 2018-01-14 NOTE — Telephone Encounter (Signed)
I informed pt's dtr, Eliezer Lofts of the vicodin prescription and she states she would like it sent to the [facility] in [place].

## 2018-01-14 NOTE — Telephone Encounter (Signed)
Surgical Center called stating that prescription sent in for patient has her last name spelt incorrectly. Prescription will need to be resent over with corrected last name. Please give surgical center a call once updated.

## 2018-01-14 NOTE — Telephone Encounter (Addendum)
[facility] nurse - Jocelyn Lamer states pt has left, will need to escribe the medications to pt's pharmacy.

## 2018-01-14 NOTE — Telephone Encounter (Signed)
I called pt's [name], and spoke with dtr and pt and informed that Dr. Paulla Dolly had called in the vicodin.

## 2018-01-14 NOTE — Telephone Encounter (Addendum)
I called pt's dtr, Eliezer Lofts states she is at work and pt is going to take the pain medication prescribed by Dr. Paulla Dolly at her last surgery, hydrocodone. Eliezer Lofts request a call back with the name of the prescription.

## 2018-01-14 NOTE — Telephone Encounter (Signed)
[name] states the prescription was for Vicodin 10/325.

## 2018-01-16 ENCOUNTER — Ambulatory Visit
Admission: RE | Admit: 2018-01-16 | Discharge: 2018-01-16 | Disposition: A | Discharge status: Home / Self Care | Payer: BC Managed Care – PPO | Source: Ambulatory Visit | Attending: Orthopedic Surgery | Admitting: Orthopedic Surgery

## 2018-01-16 DIAGNOSIS — M545 Low back pain, unspecified: Secondary | ICD-10-CM

## 2018-01-16 DIAGNOSIS — G8929 Other chronic pain: Secondary | ICD-10-CM

## 2018-01-16 MED ORDER — ONDANSETRON HCL 4 MG/2ML IJ SOLN: 4.0000 mg | Freq: Four times a day (QID) | INTRAMUSCULAR | Status: DC | PRN

## 2018-01-16 MED ORDER — IOPAMIDOL (ISOVUE-M 200) INJECTION 41%
18.0000 mL | Freq: Once | INTRAMUSCULAR | Status: AC
  Administered 2018-01-16: 18 mL via INTRATHECAL

## 2018-01-16 MED ORDER — DIAZEPAM 5 MG PO TABS
5.0000 mg | ORAL_TABLET | Freq: Once | ORAL | Status: AC
  Administered 2018-01-16: 5 mg via ORAL

## 2018-01-16 NOTE — Discharge Instructions (Signed)
Myelogram Discharge Instructions  1. Go home and rest quietly for the next 24 hours.  It is important to lie flat for the next 24 hours.  Get up only to go to the restroom.  You may lie in the bed or on a couch on your back, your stomach, your left side or your right side.  You may have one pillow under your head.  You may have pillows between your knees while you are on your side or under your knees while you are on your back.  2. DO NOT drive today.  Recline the seat as far back as it will go, while still wearing your seat belt, on the way home.  3. You may get up to go to the bathroom as needed.  You may sit up for 10 minutes to eat.  You may resume your normal diet and medications unless otherwise indicated.  Drink lots of extra fluids today and tomorrow.  4. The incidence of headache, nausea, or vomiting is about 5% (one in 20 patients).  If you develop a headache, lie flat and drink plenty of fluids until the headache goes away.  Caffeinated beverages may be helpful.  If you develop severe nausea and vomiting or a headache that does not go away with flat bed rest, call [phone].  5. You may resume normal activities after your 24 hours of bed rest is over; however, do not exert yourself strongly or do any heavy lifting tomorrow. If when you get up you have a headache when standing, go back to bed and force fluids for another 24 hours.  6. Call your physician for a follow-up appointment.  The results of your myelogram will be sent directly to your physician by the following day.  7. If you have any questions or if complications develop after you arrive home, please call [phone].  Discharge instructions have been explained to the patient.  The patient, or the person responsible for the patient, fully understands these instructions.  YOU MAY RESTART YOUR ADDERALL AND CYMBALTA TOMORROW 01/17/2018 AT 10:30AM.

## 2018-01-16 NOTE — Progress Notes (Signed)
Patient states she has been off Cymbalta and Adderall for at least the past two days.

## 2018-01-21 ENCOUNTER — Encounter: Payer: BC Managed Care – PPO | Admitting: Gastroenterology

## 2018-01-21 ENCOUNTER — Telehealth: Payer: Self-pay

## 2018-01-21 ENCOUNTER — Encounter: Payer: BC Managed Care – PPO | Admitting: Podiatry

## 2018-01-21 ENCOUNTER — Encounter

## 2018-01-21 ENCOUNTER — Encounter: Payer: Self-pay | Admitting: Gastroenterology

## 2018-01-21 ENCOUNTER — Ambulatory Visit (AMBULATORY_SURGERY_CENTER): Payer: BC Managed Care – PPO | Admitting: Gastroenterology

## 2018-01-21 VITALS — BP 123/71 | HR 82 | Temp 97.8°F | Resp 19 | Ht 61.0 in | Wt 193.0 lb

## 2018-01-21 DIAGNOSIS — R1010 Upper abdominal pain, unspecified: Secondary | ICD-10-CM | POA: Diagnosis not present

## 2018-01-21 DIAGNOSIS — D125 Benign neoplasm of sigmoid colon: Secondary | ICD-10-CM

## 2018-01-21 DIAGNOSIS — D123 Benign neoplasm of transverse colon: Secondary | ICD-10-CM

## 2018-01-21 DIAGNOSIS — Z1211 Encounter for screening for malignant neoplasm of colon: Secondary | ICD-10-CM | POA: Diagnosis not present

## 2018-01-21 DIAGNOSIS — D120 Benign neoplasm of cecum: Secondary | ICD-10-CM | POA: Diagnosis not present

## 2018-01-21 DIAGNOSIS — K449 Diaphragmatic hernia without obstruction or gangrene: Secondary | ICD-10-CM

## 2018-01-21 DIAGNOSIS — D122 Benign neoplasm of ascending colon: Secondary | ICD-10-CM | POA: Diagnosis not present

## 2018-01-21 MED ORDER — SODIUM CHLORIDE 0.9 % IV SOLN: 500.0000 mL | Freq: Once | INTRAVENOUS | Status: DC

## 2018-01-21 NOTE — Op Note (Signed)
[facility] Patient Name: [name] Procedure Date: 01/21/2018 3:40 PM MRN: [id number] Endoscopist: Remo Lipps P. Havery Moros , MD Age: 67 Referring MD:  Date of Birth: 1950/12/24 Gender: Female Account #: [id number] Procedure:                Upper GI endoscopy Indications:              Epigastric abdominal pain, history of belching -                            improved on nexium in the past, currently not on                            any medication Medicines:                Monitored Anesthesia Care Procedure:                Pre-Anesthesia Assessment:                           - Prior to the procedure, a History and Physical                            was performed, and patient medications and                            allergies were reviewed. The patient's tolerance of                            previous anesthesia was also reviewed. The risks                            and benefits of the procedure and the sedation                            options and risks were discussed with the patient.                            All questions were answered, and informed consent                            was obtained. Prior Anticoagulants: The patient has                            taken no previous anticoagulant or antiplatelet                            agents. ASA Grade Assessment: III - A patient with                            severe systemic disease. After reviewing the risks                            and benefits, the patient was deemed in  satisfactory condition to undergo the procedure.                           After obtaining informed consent, the endoscope was                            passed under direct vision. Throughout the                            procedure, the patient's blood pressure, pulse, and                            oxygen saturations were monitored continuously. The                            Model GIF-HQ190 [id number]) scope  was introduced                            through the mouth, and advanced to the second part                            of duodenum. The upper GI endoscopy was                            accomplished without difficulty. The patient                            tolerated the procedure well. Scope In: Scope Out: Findings:                 Esophagogastric landmarks were identified: the                            Z-line was found at 33 cm, the gastroesophageal                            junction was found at 33 cm and the upper extent of                            the gastric folds was found at 39 cm from the                            incisors.                           A 6 cm hiatal hernia was present.                           The exam of the esophagus was otherwise normal.                           The entire examined stomach was normal. Biopsies                            were taken with  a cold forceps for Helicobacter                            pylori testing.                           The duodenal bulb and second portion of the                            duodenum were normal. Complications:            No immediate complications. Estimated blood loss:                            Minimal. Estimated Blood Loss:     Estimated blood loss was minimal. Impression:               - Esophagogastric landmarks identified.                           - 6 cm hiatal hernia.                           - Normal esophagus otherwise                           - Normal stomach. Biopsied.                           - Normal duodenal bulb and second portion of the                            duodenum.                           Symptoms may likely be due to hiatal hernia /                            reflux, would continue nexium / PPI as needed                            moving forward. Recommendation:           - Patient has a contact number available for                            emergencies. The signs and  symptoms of potential                            delayed complications were discussed with the                            patient. Return to normal activities tomorrow.                            Written discharge instructions were provided to the  patient.                           - Resume previous diet.                           - Continue present medications.                           - Await pathology results. Remo Lipps P. Jamarii Banks, MD 01/21/2018 4:36:24 PM This report has been signed electronically.

## 2018-01-21 NOTE — Progress Notes (Signed)
Pt's states no medical or surgical changes since previsit or office visit. Pt did not tolerate the prep very well due to physical problems with her foot following recent foot surgery and discomfort. She also "must've had a reaction to the prep as my bottom has been on fire since I started the prep." She is also addiment that she take her antibiotic prophylactic prior to any procedure. Notified Dr. Havery Moros and he will discuss with the patient. SM

## 2018-01-21 NOTE — Progress Notes (Signed)
Called to room to assist during endoscopic procedure.  Patient ID and intended procedure confirmed with present staff. Received instructions for my participation in the procedure from the performing physician.  

## 2018-01-21 NOTE — Patient Instructions (Signed)
Information on polyps, hemorrhoids, and hiatal hernias given to you today.  Await pathology results.  YOU HAD AN ENDOSCOPIC PROCEDURE TODAY AT [facility]:   Refer to the procedure report that was given to you for any specific questions about what was found during the examination.  If the procedure report does not answer your questions, please call your gastroenterologist to clarify.  If you requested that your care partner not be given the details of your procedure findings, then the procedure report has been included in a sealed envelope for you to review at your convenience later.  YOU SHOULD EXPECT: Some feelings of bloating in the abdomen. Passage of more gas than usual.  Walking can help get rid of the air that was put into your GI tract during the procedure and reduce the bloating. If you had a lower endoscopy (such as a colonoscopy or flexible sigmoidoscopy) you may notice spotting of blood in your stool or on the toilet paper. If you underwent a bowel prep for your procedure, you may not have a normal bowel movement for a few days.  Please Note:  You might notice some irritation and congestion in your nose or some drainage.  This is from the oxygen used during your procedure.  There is no need for concern and it should clear up in a day or so.  SYMPTOMS TO REPORT IMMEDIATELY:   Following lower endoscopy (colonoscopy or flexible sigmoidoscopy):  Excessive amounts of blood in the stool  Significant tenderness or worsening of abdominal pains  Swelling of the abdomen that is new, acute  Fever of 100F or higher   Following upper endoscopy (EGD)  Vomiting of blood or coffee ground material  New chest pain or pain under the shoulder blades  Painful or persistently difficult swallowing  New shortness of breath  Fever of 100F or higher  Black, tarry-looking stools  For urgent or emergent issues, a gastroenterologist can be reached at any hour by calling [phone]  [phone].   DIET:  We do recommend a small meal at first, but then you may proceed to your regular diet.  Drink plenty of fluids but you should avoid alcoholic beverages for 24 hours.  ACTIVITY:  You should plan to take it easy for the rest of today and you should NOT DRIVE or use heavy machinery until tomorrow (because of the sedation medicines used during the test).    FOLLOW UP: Our staff will call the number listed on your records the next business day following your procedure to check on you and address any questions or concerns that you may have regarding the information given to you following your procedure. If we do not reach you, we will leave a message.  However, if you are feeling well and you are not experiencing any problems, there is no need to return our call.  We will assume that you have returned to your regular daily activities without incident.  If any biopsies were taken you will be contacted by phone or by letter within the next 1-3 weeks.  Please call us at [phone] if you have not heard about the biopsies in 3 weeks.    SIGNATURES/CONFIDENTIALITY: You and/or your care partner have signed paperwork which will be entered into your electronic medical record.  These signatures attest to the fact that that the information above on your After Visit Summary has been reviewed and is understood.  Full responsibility of the confidentiality of this discharge information lies with  you and/or your care-partner.

## 2018-01-21 NOTE — Progress Notes (Signed)
PT taken to PACU. Monitors in place. VSS. Report given to RN. 

## 2018-01-21 NOTE — Telephone Encounter (Signed)
Patient called in this morning complaining of weakness, shakiness, and some nausea that started immediately after starting her Suprep last night.  Patient has been constantly going to the bathroom and her stools are now watery but brown.  Spoke with Dr. Havery Moros and advised client to continue gatorade and to sttart her second bottle of prep very slowly.  If can not tolerate the second part of her prep then she can come in and have the upper endoscopy only.  Client verbalizes understanding of instructions.

## 2018-01-21 NOTE — Op Note (Signed)
[name]: [name] Procedure Date: 01/21/2018 3:40 PM MRN: [id number] Endoscopist: Remo Lipps P. Havery Moros , MD Age: 67 Referring MD:  Date of Birth: Nov 13, 1950 Gender: Female Account #: [id number] Procedure:                Colonoscopy Indications:              Screening for colorectal malignant neoplasm Medicines:                Monitored Anesthesia Care Procedure:                Pre-Anesthesia Assessment:                           - Prior to the procedure, a History and Physical                            was performed, and patient medications and                            allergies were reviewed. The patient's tolerance of                            previous anesthesia was also reviewed. The risks                            and benefits of the procedure and the sedation                            options and risks were discussed with the patient.                            All questions were answered, and informed consent                            was obtained. Prior Anticoagulants: The patient has                            taken no previous anticoagulant or antiplatelet                            agents. ASA Grade Assessment: III - A patient with                            severe systemic disease. After reviewing the risks                            and benefits, the patient was deemed in                            satisfactory condition to undergo the procedure.                           After obtaining informed consent, the colonoscope  was passed under direct vision. Throughout the                            procedure, the patient's blood pressure, pulse, and                            oxygen saturations were monitored continuously. The                            Colonoscope was introduced through the anus and                            advanced to the the terminal ileum, with                            identification of  the appendiceal orifice and IC                            valve. The colonoscopy was performed without                            difficulty. The patient tolerated the procedure                            well. The quality of the bowel preparation was                            adequate. The terminal ileum, ileocecal valve,                            appendiceal orifice, and rectum were photographed. Scope In: 3:53:57 PM Scope Out: 4:24:03 PM Scope Withdrawal Time: 0 hours 18 minutes 23 seconds  Total Procedure Duration: 0 hours 30 minutes 6 seconds  Findings:                 The perianal and digital rectal examinations were                            normal.                           The terminal ileum appeared normal.                           Two sessile polyps were found in the cecum. The                            polyps were 5 to 6 mm in size. These polyps were                            removed with a cold snare. Resection and retrieval                            were complete.  A 4 mm polyp was found in the ascending colon. The                            polyp was sessile. The polyp was removed with a                            cold snare. Resection and retrieval were complete.                           A 3 mm polyp was found in the transverse colon. The                            polyp was sessile. The polyp was removed with a                            cold snare. Resection and retrieval were complete.                           A 4 mm polyp was found in the splenic flexure. The                            polyp was sessile. The polyp was removed with a                            cold snare. Resection and retrieval were complete.                           Two sessile polyps were found in the sigmoid colon.                            The polyps were 3 mm in size. These polyps were                            removed with a cold snare. Resection and retrieval                             were complete.                           Internal hemorrhoids were found during retroflexion.                           The exam was otherwise without abnormality. Complications:            No immediate complications. Estimated blood loss:                            Minimal. Estimated Blood Loss:     Estimated blood loss was minimal. Impression:               - The examined portion of the ileum was normal.                           -  Two 5 to 6 mm polyps in the cecum, removed with a                            cold snare. Resected and retrieved.                           - One 4 mm polyp in the ascending colon, removed                            with a cold snare. Resected and retrieved.                           - One 3 mm polyp in the transverse colon, removed                            with a cold snare. Resected and retrieved.                           - One 4 mm polyp at the splenic flexure, removed                            with a cold snare. Resected and retrieved.                           - Two 3 mm polyps in the sigmoid colon, removed                            with a cold snare. Resected and retrieved.                           - Internal hemorrhoids.                           - The examination was otherwise normal. Recommendation:           - Patient has a contact number available for                            emergencies. The signs and symptoms of potential                            delayed complications were discussed with the                            patient. Return to normal activities tomorrow.                            Written discharge instructions were provided to the                            patient.                           - Resume previous diet.                           -  Continue present medications.                           - Await pathology results. Remo Lipps P. Nyko Gell, MD 01/21/2018 4:31:41 PM This report has been signed  electronically.

## 2018-01-22 ENCOUNTER — Telehealth: Payer: Self-pay | Admitting: *Deleted

## 2018-01-22 NOTE — Telephone Encounter (Signed)
  Follow up Call-  Call back number 01/21/2018  Post procedure Call Back phone  # [phone]  Permission to leave phone message Yes  Some recent data might be hidden     Patient questions:  Do you have a fever, pain , or abdominal swelling? No. Pain Score  0 *  Have you tolerated food without any problems? Yes.    Have you been able to return to your normal activities? Yes.    Do you have any questions about your discharge instructions: Diet   No. Medications  No. Follow up visit  No.  Do you have questions or concerns about your Care? No.  Actions: * If pain score is 4 or above: No action needed, pain <4.

## 2018-01-24 ENCOUNTER — Ambulatory Visit (INDEPENDENT_AMBULATORY_CARE_PROVIDER_SITE_OTHER): Payer: Self-pay | Admitting: Podiatry

## 2018-01-24 ENCOUNTER — Ambulatory Visit (INDEPENDENT_AMBULATORY_CARE_PROVIDER_SITE_OTHER): Payer: BC Managed Care – PPO

## 2018-01-24 VITALS — BP 118/83 | HR 92 | Temp 98.6°F

## 2018-01-24 DIAGNOSIS — M2041 Other hammer toe(s) (acquired), right foot: Secondary | ICD-10-CM

## 2018-01-24 NOTE — Progress Notes (Addendum)
This patient presents the office for an evaluation of her right foot following surgery by Dr. Paulla Dolly.  Surgery was performed 10 days ago by Dr. Paulla Dolly.  The surgery performed was a hammertoe repair at the DIPJ third toe right foot and and MPJ release the second MPJ right foot.  Patient states that she does not experience much pain and discomfort and does experience occasional itching.  Patient presents the office today wearing bandage that was applied at the surgical center.  She presents the office for an evaluation of her right surgical foot.  Objective neurovascular status intact good wound coaptation noted at the DIPJ of the third toe right foot.  There does appear to be redness and swelling at the incision site which could be a reaction to the sutures.  There is no evidence of any infection noted.  There is no redness swelling or inflammation noted over the second MPJ of the right foot where the release of the MPJ was performed.   S/P Foot surgery right foot.  POV # 1.   X-rays taken reveal arthritic problems noted in the first MPJ of the right foot.  Absence of the proximal phalanx of the fifth digit right foot.  Absence of the head of the middle phalanx of the third digit right foot.  The second and third toe are both in rectus positions.  Healing is noted over the second MPJ but I chose to keep the sutures intact until the sutures are removed from the third digit.  Dry sterile dressing and a bandage was applied over the second MPJ.  I am still concerned about the redness in the third digit right foot.  This appears to be localized at the incision site therefore I believe this was due to a suture reaction.  I felt that the sutures needed another week to be in place so I recommended she return to the office in 1 week.  Neosporin dry sterile dressing was applied solely to the third toe right foot.  RTC 1 week.. Patient is to continue to walk with her surgical shoe.  Call the office as needed.   Gardiner Barefoot DPM

## 2018-01-29 ENCOUNTER — Encounter: Payer: Self-pay | Admitting: Gastroenterology

## 2018-02-05 ENCOUNTER — Encounter: Payer: Self-pay | Admitting: Family Medicine

## 2018-02-05 ENCOUNTER — Ambulatory Visit (INDEPENDENT_AMBULATORY_CARE_PROVIDER_SITE_OTHER): Payer: BC Managed Care – PPO | Admitting: Family Medicine

## 2018-02-05 VITALS — BP 128/64 | HR 98 | Ht 61.0 in | Wt 194.0 lb

## 2018-02-05 DIAGNOSIS — J4550 Severe persistent asthma, uncomplicated: Secondary | ICD-10-CM | POA: Diagnosis not present

## 2018-02-05 DIAGNOSIS — K449 Diaphragmatic hernia without obstruction or gangrene: Secondary | ICD-10-CM | POA: Diagnosis not present

## 2018-02-05 DIAGNOSIS — N393 Stress incontinence (female) (male): Secondary | ICD-10-CM

## 2018-02-05 DIAGNOSIS — F901 Attention-deficit hyperactivity disorder, predominantly hyperactive type: Secondary | ICD-10-CM

## 2018-02-05 DIAGNOSIS — M79676 Pain in unspecified toe(s): Secondary | ICD-10-CM

## 2018-02-05 NOTE — Progress Notes (Signed)
Subjective:    CC: Asthma  HPI: ADD - Reports symptoms are well controlled on current regime. Denies any problems with insomnia, chest pain, palpitations, or SOB.    F/U Asthma -does look in the last couple weeks since having anesthesia couple times in a row for her foot surgery and then for her scope that she is had more wheezing and extra noises in her chest.  No cough or fever.  Been using her albuterol at least daily for the last couple of weeks.  She is not on any type of controller right now.  She reports her daughter has noticed that she gasps in her sleep.  No history of sleep apnea.  She just noticed that her diaphragm feels like it almost spasms.  She also wanted to give me some updates.  She did go see podiatry and had her toe surgery done in the middle of December.  She had of follow-up about 10 days ago.  It is still been swollen and red and wanted me to look at it today because she is worried it could be infected.  Her regular follow-up is on Thursday.  She has been buddy taping the 2 toes as recommended by 1 of her podiatrist partners when she went for her follow-up. No fever.  He has been trying to keep her foot elevated but it is painful to walk.  She also recently had an endoscopy.  She was told that she had a hiatal hernia.  She does have very loud forceful belches it times.  She reports a Dr. Havery Moros so there really was not a whole lot that could be done about it unless the hiatal hernia becomes worse or causes more severe symptoms.  She is weaning off of her Nexium and will just start using a PPI as needed over-the-counter.   Incontinence-she came off of the mid upper tract and is trying 8 mg of Toviaz.  It has been giving her a little bit dry mouth and scratchy voice.  She is also been working on weaning down her Cymbalta and is now just taking 30 mg at night.  He recently restarted her glucosamine/chondroitin and also started turmeric about 1 week ago.  Past medical  history, Surgical history, Family history not pertinant except as noted below, Social history, Allergies, and medications have been entered into the medical record, reviewed, and corrections made.   Review of Systems: No fevers, chills, night sweats, weight loss, chest pain, or shortness of breath.   Objective:    General: Well Developed, well nourished, and in no acute distress.  Neuro: Alert and oriented x3, extra-ocular muscles intact, sensation grossly intact.  HEENT: Normocephalic, atraumatic  Skin: Warm and dry, no rashes. Cardiac: Regular rate and rhythm, no murmurs rubs or gallops, no lower extremity edema.  Respiratory: Clear to auscultation bilaterally. Not using accessory muscles, speaking in full sentences. MSK: Her right middle toe is quite swollen.  Incision itself looks like it is healing well.  Sutures in place.  No active drainage.   Impression and Recommendations:    ADD -stable.  Last refill was in November for 90-day supply.  Repeat blood pressure at goal but will monitor carefully.  Asthma -uncontrolled, severe persistent.  I really want to teach her how to use a peak flow meter.  We gave her 1 today and set it for her.  She is going to 3 trials and take the best to see if she actually needs to use her albuterol.  Toe pain and swelling status post surgery-has follow-up with podiatry on Thursday.  It is swollen and a little bit pink but I do not think that it is infected.  Okay to keep her follow-up on Thursday.  Hiatal hernia-she had some questions about this so I explained to her what a hiatal hernia is and what can happen what symptoms are possible.  Right now she is actually been weaning off of her PPI and has been doing well.  Incontinence - she is trying Lisbeth Ply now through [facility].   Time spent 40 minutes, greater than 50% of time spent face-to-face discussing ADD, asthma, type pinkeye, hiatal hernia, and incontinence.

## 2018-02-06 ENCOUNTER — Ambulatory Visit (INDEPENDENT_AMBULATORY_CARE_PROVIDER_SITE_OTHER): Payer: BC Managed Care – PPO

## 2018-02-06 ENCOUNTER — Ambulatory Visit (INDEPENDENT_AMBULATORY_CARE_PROVIDER_SITE_OTHER): Payer: BC Managed Care – PPO | Admitting: Podiatry

## 2018-02-06 ENCOUNTER — Encounter: Payer: Self-pay | Admitting: Podiatry

## 2018-02-06 DIAGNOSIS — M205X1 Other deformities of toe(s) (acquired), right foot: Secondary | ICD-10-CM | POA: Diagnosis not present

## 2018-02-06 DIAGNOSIS — M2041 Other hammer toe(s) (acquired), right foot: Secondary | ICD-10-CM

## 2018-02-06 DIAGNOSIS — M2042 Other hammer toe(s) (acquired), left foot: Secondary | ICD-10-CM

## 2018-02-06 NOTE — Progress Notes (Signed)
Subjective:   Patient ID: [name], female   DOB: 68 y.o.   MRN: [id number]   HPI Patient states overall doing very well with swelling still the third toe stitches in place very happy with position of the second   ROS      Objective:  Physical Exam  Neurovascular status intact negative Homans sign noted with patient second toe in a much better position third toe mildly swollen at the distal phalangeal joint but into much better structural position with stitches in place     Assessment:  Doing well post arthroplasty June 3 right tenotomy second digit right     Plan:  H&P x-ray reviewed stitches removed wound edges well coapted advised on gradual return to soft shoe wear that swelling will persist for several months and will be seen back to recheck  X-ray indicates satisfactory resection of bone with no other pathology

## 2018-02-15 ENCOUNTER — Ambulatory Visit: Payer: BC Managed Care – PPO | Admitting: Family Medicine

## 2018-03-06 DIAGNOSIS — M545 Low back pain, unspecified: Secondary | ICD-10-CM | POA: Insufficient documentation

## 2018-03-07 ENCOUNTER — Encounter: Payer: Self-pay | Admitting: Family Medicine

## 2018-03-07 ENCOUNTER — Ambulatory Visit (INDEPENDENT_AMBULATORY_CARE_PROVIDER_SITE_OTHER): Payer: 59 | Admitting: Family Medicine

## 2018-03-07 VITALS — BP 126/68 | HR 88 | Ht 61.02 in | Wt 193.0 lb

## 2018-03-07 DIAGNOSIS — J4520 Mild intermittent asthma, uncomplicated: Secondary | ICD-10-CM

## 2018-03-07 DIAGNOSIS — F901 Attention-deficit hyperactivity disorder, predominantly hyperactive type: Secondary | ICD-10-CM | POA: Diagnosis not present

## 2018-03-07 DIAGNOSIS — N393 Stress incontinence (female) (male): Secondary | ICD-10-CM

## 2018-03-07 DIAGNOSIS — N3281 Overactive bladder: Secondary | ICD-10-CM | POA: Diagnosis not present

## 2018-03-07 DIAGNOSIS — J3089 Other allergic rhinitis: Secondary | ICD-10-CM

## 2018-03-07 NOTE — Patient Instructions (Signed)
Try the Cymbalta 30 mg twice a day.  If you like this better than the Cymbalta 60 mg once a day then please just let me know and we will send a new prescription.  You can try calling your insurance to see if you can qualify for a tier exemption on the [place].

## 2018-03-07 NOTE — Progress Notes (Signed)
Subjective:    CC:   HPI:  F/U Asthma -he is actually doing well she still feels like she runs out of breath sometimes but has been using her peak flow meter.  She says sometimes she can get right to 300 Mark but almost never gets above it.  She said she is only had to use her albuterol once and that was when she was only able to get to about 230 on the peak flow meter.  It is been challenging to use it but says it has been helpful.  F/U OAB - she has seen Urology earlier this week. She is doing well on Toviaz.  Myrbetriq did not work as well.  They also discussed the possibility of doing percutaneous tibial nerve stimulation treatments as well but it would take 12 weeks and typically the benefit is not seen until almost the 10 to 12-week mark.  She did see ortho for her low back and buttock pain - he dx her with stenosis at the L4-5 as well as a herniated disc.  Has been taking 2 tylenol arthritis TID.  She uses flexeril at bedtime.  She is also taking Cymbalta 60 mg once today but wants to try may be splitting it to 30 mg twice a way to see if this helps with her pain especially through the night.  In regards to her nasal congestion and allergies she notices that it is worse in the evenings but she does take her Claritin first thing in the morning and does her mometasone nasal spray at bedtime.  She also wanted to let me know that she went from her full-time position and has stepped back to part-time.  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  She says this is really helped free her up and has been happy about the decision.  Past medical history, Surgical history, Family history not pertinant except as noted below, Social history, Allergies, and medications have been entered into the medical record, reviewed, and corrections made.   Review of Systems: No fevers, chills, night sweats, weight loss, chest pain, or shortness of breath.    Objective:    General: Well Developed, well nourished, and in no acute distress.  Neuro: Alert and oriented x3, extra-ocular muscles intact, sensation grossly intact.  HEENT: Normocephalic, atraumatic  Skin: Warm and dry, no rashes. Cardiac: Regular rate and rhythm, no murmurs rubs or gallops, no lower extremity edema.  Respiratory: Clear to auscultation bilaterally. Not using accessory muscles, speaking in full sentences.   Impression and Recommendations:    Asthma - Mild intermittant.  She is only had to use her albuterol once based on her peak flows.  I think this is a great success and it sounds like she is doing great.  She still having some intermittent shortness of breath but I think getting more active and starting to exercise will help.  She is also gained some weight this year and that may even be contributing to some of her deconditioning and shortness of breath.  OAB - doing well on Toviaz. Cost may be an issue later on.   She can call her insurance and ask for a Licensed conveyancer.    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  recently diagnosed with stenosis at L3-4, herniated disc and scoliosis she will start PT with Dr. Charlestine Night office, the orthopedist.  Hopefully this will help her and provide some  relief.  We did discuss cutting back on her Tylenol and not going above 3000 mg/day.  Continue with Flexeril at bedtime since this does seem to help her.  Allergic rhinitis-continue with Claritin and mometasone nasal spray.  She wanted to know if she could try taking a Zyrtec at bedtime.  She certainly can but I did warn about the potential for sedation and dry mouth by adding antihistamines together and cautioned her to be careful about that.  I even want to consider dust mite covers on her pillowcase since her nasal congestion tends to get worse at night.  ADD-she is doing well.  Now that she is only working part-time she is not necessarily taking her  medication daily she does not need any refills today.  Time spent 45 min, greater than 50% of time spent face-to-face discussing her asthma, overactive bladder, chronic low back pain, allergic rhinitis.

## 2018-03-12 ENCOUNTER — Ambulatory Visit (INDEPENDENT_AMBULATORY_CARE_PROVIDER_SITE_OTHER): Payer: 59

## 2018-03-12 ENCOUNTER — Ambulatory Visit (INDEPENDENT_AMBULATORY_CARE_PROVIDER_SITE_OTHER): Payer: 59 | Admitting: Family Medicine

## 2018-03-12 ENCOUNTER — Encounter: Payer: Self-pay | Admitting: Family Medicine

## 2018-03-12 VITALS — BP 119/72 | HR 107 | Temp 98.8°F | Wt 194.0 lb

## 2018-03-12 DIAGNOSIS — R05 Cough: Secondary | ICD-10-CM

## 2018-03-12 DIAGNOSIS — J4541 Moderate persistent asthma with (acute) exacerbation: Secondary | ICD-10-CM

## 2018-03-12 DIAGNOSIS — R0989 Other specified symptoms and signs involving the circulatory and respiratory systems: Secondary | ICD-10-CM | POA: Diagnosis not present

## 2018-03-12 DIAGNOSIS — R059 Cough, unspecified: Secondary | ICD-10-CM

## 2018-03-12 MED ORDER — BUDESONIDE-FORMOTEROL FUMARATE 160-4.5 MCG/ACT IN AERO: 2.0000 | INHALATION_SPRAY | Freq: Two times a day (BID) | RESPIRATORY_TRACT | 3 refills | Status: DC

## 2018-03-12 MED ORDER — HYDROCOD POLST-CPM POLST ER 10-8 MG/5ML PO SUER: 5.0000 mL | Freq: Two times a day (BID) | ORAL | 0 refills | Status: DC | PRN

## 2018-03-12 NOTE — Progress Notes (Signed)
[name] is a 68 y.o. female who presents to [facility] today for cough.  [name] has a several day history of cough.  Symptoms occurred shortly after her last visit on February 6.  She denies significant runny nose or congestion.  She notes a bit of wheezing.  She notes that her peak flow is less than usual.  She is in the low 200s where she is typically in the low 300s.  She is not tried using her albuterol inhaler yet.  She is tried over-the-counter medications for cough as well as [employer] which have not helped much.  No fevers chills nausea vomiting or diarrhea.   ROS as above:  Exam:  BP 119/72   Pulse (!) 107   Temp 98.8 F (37.1 C) (Oral)   Wt 194 lb (88 kg)   LMP 08/30/2008   SpO2 98%   BMI 36.63 kg/m  Wt Readings from Last 5 Encounters:  03/12/18 194 lb (88 kg)  03/07/18 193 lb (87.5 kg)  02/05/18 194 lb (88 kg)  01/21/18 193 lb (87.5 kg)  12/24/17 193 lb 3.2 oz (87.6 kg)    Gen: Well NAD HEENT: EOMI,  MMM Lungs: Normal work of breathing.  Rhonchi present left lower lung field.  Otherwise prolonged expiratory phase present throughout. Heart: RRR no MRG heart rate 90 bpm per my check Abd: NABS, Soft. Nondistended, Nontender Exts: Brisk capillary refill, warm and well perfused.   Lab and Radiology Results Two-view chest x-ray images personally independently reviewed Hiatal hernia present.  No acute infiltrate visible per my read.  Slight change in appearance from chest x-ray January 2018. Await formal radiology review  Assessment and Plan: 68 y.o. female with cough.  Likely asthma exacerbation due to viral URI.  Formal x-ray read is however pending.  Will treat symptomatically with continued over-the-counter medications.  Additionally will use codeine-based cough syrup as well as Symbicort inhaler.  Continue albuterol as needed.  Recheck  if not improving.  Follow-up with PCP in the near future.  PDMP reviewed during this encounter. Orders Placed This Encounter  Procedures  . DG Chest 2 View    Order Specific Question:   Reason for exam:    Answer:   Cough, assess intra-thoracic pathology    Order Specific Question:   Preferred imaging location?    Answer:   [facility]   Meds ordered this encounter  Medications  . chlorpheniramine-HYDROcodone (TUSSIONEX) 10-8 MG/5ML SUER    Sig: Take 5 mLs by mouth every 12 (twelve) hours as needed for cough (cough, will cause drowsiness.).    Dispense:  120 mL    Refill:  0  . budesonide-formoterol (SYMBICORT) 160-4.5 MCG/ACT inhaler    Sig: Inhale 2 puffs into the lungs 2 (two) times daily.    Dispense:  1 Inhaler    Refill:  3     Historical information moved to improve visibility of documentation.  Past Medical History:  Diagnosis Date  . ADD (attention deficit disorder)   . Arthritis    "bunion on  right" (08/11/2014)  . Chronic bronchitis ([place])    "plenty; but not q yr" (08/11/2014)  . Depression   . Dyslipidemia   . Environmental allergies   . GERD (gastroesophageal reflux disease)   . Hearing difficulty of left ear    "grew up w/deaf parent; find myself lip reading more recently" (08/11/2014)  . Hepatitis    "exposed  in college; rec'd gamma globulin; 6 months later S/S (weakness, lethargy, fevers); ; dx'd infectious hepatitis"  . History of herpes genitalis    "stress induced reoccurance that shows up on my left middle finger" (08/11/2014)  . History of hiatal hernia   . History of stomach ulcers   . Hypercholesterolemia   . Hyperthyroidism 1994   "postpartum only; resolved itself"  . Incisional hernia   . Internal hemorrhoids   . Leg cramping   . Osteopenia   . Seasonal asthma   . Stress incontinence, female   . Ulcer    Past Surgical History:  Procedure Laterality Date  . CESAREAN SECTION  1994  . CHALAZION EXCISION Left ~ 2013  . DILATION  AND CURETTAGE OF UTERUS  X 2  . EYE SURGERY    . HERNIA REPAIR  08/11/2014  . INCISIONAL HERNIA REPAIR N/A 08/11/2014   Procedure: LAPAROSCOPIC INCISIONAL HERNIA REPAIR;  Surgeon: Coralie Keens, MD;  Location: [facility];  Service: General;  Laterality: N/A;  . INCONTINENCE SURGERY  2005  . INGUINAL HERNIA REPAIR Left 08/11/2014  . INGUINAL HERNIA REPAIR Left 08/11/2014   Procedure: LEFT INGUINAL HERNIA REPAIR;  Surgeon: Coralie Keens, MD;  Location: [facility];  Service: General;  Laterality: Left;  . INSERTION OF MESH Left 08/11/2014   Procedure: INSERTION OF MESH TO LEFT GROIN AND ABDOMEN;  Surgeon: Coralie Keens, MD;  Location: [facility];  Service: General;  Laterality: Left;  . LAPAROSCOPIC INCISIONAL / UMBILICAL / VENTRAL HERNIA REPAIR  08/11/2014   [facility]  . NASAL POLYP EXCISION  2014  . REPAIR OF PERFORATED ULCER  1985  . TONSILLECTOMY    . TOTAL SHOULDER ARTHROPLASTY Left 09/21/2017   Procedure: LEFT TOTAL SHOULDER ARTHROPLASTY;  Surgeon: Nicholes Stairs, MD;  Location: [facility];  Service: Orthopedics;  Laterality: Left;   Social History   Tobacco Use  . Smoking status: Never Smoker  . Smokeless tobacco: Never Used  Substance Use Topics  . Alcohol use: Yes    Alcohol/week: 2.0 standard drinks    Types: 1 Glasses of wine, 1 Shots of liquor per week    Comment: 08/11/2014 "0-2 glasses of wine or a mixed drink q wk"   family history includes Arthritis in her father and mother; Cancer in her father; Diabetes in her maternal grandmother and mother; Heart disease in her mother; Hyperlipidemia in her mother; Lymphoma in her paternal grandmother; Lymphoma (age of onset: 43) in her father; Osteoporosis in her mother.  Medications: Current Outpatient Medications  Medication Sig Dispense Refill  . acetaminophen (TYLENOL) 650 MG CR tablet Take 1,300 mg by mouth See admin instructions. Take 1,300 mg by mouth at bedtime and may also take an additional 1,300 mg one to two times a day as needed for  pain    . albuterol (PROAIR HFA) 108 (90 Base) MCG/ACT inhaler Inhale 2 puffs into the lungs every 6 (six) hours as needed for wheezing or shortness of breath. 3 Inhaler 0  . amphetamine-dextroamphetamine (ADDERALL) 20 MG tablet Take 1 tablet (20 mg total) by mouth 2 (two) times daily as needed. 180 tablet 0  . Biotin 5 MG TABS Take 1 tablet by mouth daily.    . Calcium Carb-Cholecalciferol (CALCIUM+D3 PO) Take 1 tablet by mouth daily.    . cyclobenzaprine (FLEXERIL) 10 MG tablet Take 10 mg by mouth at bedtime as needed (for sleep or pain).     . DULoxetine (CYMBALTA) 60 MG capsule Take 1 capsule (60 mg total) by  mouth every morning. 90 capsule 3  . fesoterodine (TOVIAZ) 8 MG TB24 tablet Take 8 mg by mouth daily.    Marland Kitchen ipratropium (ATROVENT) 0.03 % nasal spray Place 2 sprays into the nose 4 (four) times daily. Use as needed for runny nose and congestion 30 mL 1  . loratadine (CLARITIN) 10 MG tablet Take 1 tablet (10 mg total) by mouth daily. 90 tablet 3  . Misc Natural Products (GLUCOSAMINE CHONDROITIN MSM PO) Take 1,500 mg by mouth daily.    . mometasone (NASONEX) 50 MCG/ACT nasal spray INHALE 2 SPRAYS INTO THE NOSE DAILY AS NEEDED (Patient taking differently: Place 2 sprays into the nose daily as needed (for inflammation). ) 17 g 5  . Multiple Vitamins-Minerals (ONE-A-DAY WOMENS 50 PLUS) TABS Take 1 tablet by mouth daily.    . simvastatin (ZOCOR) 40 MG tablet Take 1 tablet (40 mg total) by mouth daily. (Patient taking differently: Take 40 mg by mouth at bedtime. ) 90 tablet 1  . Turmeric Curcumin 500 MG CAPS Take 1 capsule by mouth daily.    . budesonide-formoterol (SYMBICORT) 160-4.5 MCG/ACT inhaler Inhale 2 puffs into the lungs 2 (two) times daily. 1 Inhaler 3  . chlorpheniramine-HYDROcodone (TUSSIONEX) 10-8 MG/5ML SUER Take 5 mLs by mouth every 12 (twelve) hours as needed for cough (cough, will cause drowsiness.). 120 mL 0   No current facility-administered medications for this visit.     Allergies  Allergen Reactions  . Aspirin Other (See Comments)    GI Intolerance; History of ulcers; GI BLEED  . Bee Venom Swelling    Any insects that bite or stings- swelling at site of sting or cellulitis can develop  . Nsaids Other (See Comments)    History of ulcers     Discussed warning signs or symptoms. Please see discharge instructions. Patient expresses understanding.

## 2018-03-12 NOTE — Patient Instructions (Signed)
Thank you for coming in today.  Use the albuterol as needed.  Continue over the counter medicines as needed.  Continue tessalon as needed.  Add tessinex with hydrocodone as needed for cough.  Use the Symbicort inhaler twice daily for a few weeks.  Get xray.  If not improving next step is prednisone.  Call triage if needed.     Cough, Adult  Coughing is a reflex that clears your throat and your airways. Coughing helps to heal and protect your lungs. It is normal to cough occasionally, but a cough that happens with other symptoms or lasts a long time may be a sign of a condition that needs treatment. A cough may last only 2-3 weeks (acute), or it may last longer than 8 weeks (chronic). What are the causes? Coughing is commonly caused by:  Breathing in substances that irritate your lungs.  A viral or bacterial respiratory infection.  Allergies.  Asthma.  Postnasal drip.  Smoking.  Acid backing up from the stomach into the esophagus (gastroesophageal reflux).  Certain medicines.  Chronic lung problems, including COPD (or rarely, lung cancer).  Other medical conditions such as heart failure. Follow these instructions at home: Pay attention to any changes in your symptoms. Take these actions to help with your discomfort:  Take medicines only as told by your health care provider. ? If you were prescribed an antibiotic medicine, take it as told by your health care provider. Do not stop taking the antibiotic even if you start to feel better. ? Talk with your health care provider before you take a cough suppressant medicine.  Drink enough fluid to keep your urine clear or pale yellow.  If the air is dry, use a cold steam vaporizer or humidifier in your bedroom or your home to help loosen secretions.  Avoid anything that causes you to cough at work or at home.  If your cough is worse at night, try sleeping in a semi-upright position.  Avoid cigarette smoke. If you smoke, quit  smoking. If you need help quitting, ask your health care provider.  Avoid caffeine.  Avoid alcohol.  Rest as needed. Contact a health care provider if:  You have new symptoms.  You cough up pus.  Your cough does not get better after 2-3 weeks, or your cough gets worse.  You cannot control your cough with suppressant medicines and you are losing sleep.  You develop pain that is getting worse or pain that is not controlled with pain medicines.  You have a fever.  You have unexplained weight loss.  You have night sweats. Get help right away if:  You cough up blood.  You have difficulty breathing.  Your heartbeat is very fast. This information is not intended to replace advice given to you by your health care provider. Make sure you discuss any questions you have with your health care provider. Document Released: 07/15/2010 Document Revised: 06/24/2015 Document Reviewed: 03/25/2014 Elsevier Interactive Patient Education  2019 [employer].

## 2018-03-14 DIAGNOSIS — N3941 Urge incontinence: Secondary | ICD-10-CM | POA: Diagnosis not present

## 2018-03-14 DIAGNOSIS — M62838 Other muscle spasm: Secondary | ICD-10-CM | POA: Diagnosis not present

## 2018-03-14 DIAGNOSIS — M6281 Muscle weakness (generalized): Secondary | ICD-10-CM | POA: Diagnosis not present

## 2018-03-14 DIAGNOSIS — M6289 Other specified disorders of muscle: Secondary | ICD-10-CM | POA: Diagnosis not present

## 2018-03-14 DIAGNOSIS — R351 Nocturia: Secondary | ICD-10-CM | POA: Diagnosis not present

## 2018-03-14 DIAGNOSIS — R32 Unspecified urinary incontinence: Secondary | ICD-10-CM | POA: Diagnosis not present

## 2018-03-19 DIAGNOSIS — M545 Low back pain: Secondary | ICD-10-CM | POA: Diagnosis not present

## 2018-03-25 DIAGNOSIS — M545 Low back pain: Secondary | ICD-10-CM | POA: Diagnosis not present

## 2018-04-03 DIAGNOSIS — M545 Low back pain: Secondary | ICD-10-CM | POA: Diagnosis not present

## 2018-04-10 DIAGNOSIS — M545 Low back pain: Secondary | ICD-10-CM | POA: Diagnosis not present

## 2018-04-15 DIAGNOSIS — M545 Low back pain: Secondary | ICD-10-CM | POA: Diagnosis not present

## 2018-04-16 ENCOUNTER — Telehealth: Payer: Self-pay | Admitting: Family Medicine

## 2018-04-16 NOTE — Telephone Encounter (Signed)
[name] was seen by you in February for cough, and other symptoms. She asked, "Did she have mild case of Coronavirus when her symptoms came up?"  Please Advise

## 2018-04-17 NOTE — Telephone Encounter (Signed)
Probably should did not.  It is very hard to tell in retrospect as we do not have a test to tell us if you had coronavirus.  Test will probably be developed in the near future however though

## 2018-04-17 NOTE — Telephone Encounter (Signed)
Left message for return call.

## 2018-04-18 DIAGNOSIS — M19019 Primary osteoarthritis, unspecified shoulder: Secondary | ICD-10-CM | POA: Insufficient documentation

## 2018-04-18 DIAGNOSIS — M25511 Pain in right shoulder: Secondary | ICD-10-CM | POA: Diagnosis not present

## 2018-04-19 DIAGNOSIS — M545 Low back pain: Secondary | ICD-10-CM | POA: Diagnosis not present

## 2018-04-23 NOTE — Telephone Encounter (Signed)
ERROR

## 2018-04-26 NOTE — Telephone Encounter (Signed)
Left message for pt with advice.

## 2018-04-29 DIAGNOSIS — M545 Low back pain: Secondary | ICD-10-CM | POA: Diagnosis not present

## 2018-05-06 ENCOUNTER — Telehealth: Payer: Self-pay | Admitting: Family Medicine

## 2018-05-06 NOTE — Telephone Encounter (Signed)
Pt wants her visit with Dr Georgina Snell on 2/11 reviewed to see if she if she may have had early signs of COVID-19.  Thank you.

## 2018-05-06 NOTE — Telephone Encounter (Signed)
Pt advised. Verbalized understanding.

## 2018-05-06 NOTE — Telephone Encounter (Signed)
It probably was not COVID-19 as there was not a lot of COVID-19 in the [place] then.  Unless you know of travel to an area that was having COVID-19 cases in early February I think it is unlikely.  Unfortunately we do not have a good test to tell us if you did have COVID-19 however that test will probably be developed in the next few months and we could certainly test you at that point.

## 2018-05-07 NOTE — Telephone Encounter (Signed)
Thank you :)

## 2018-05-09 ENCOUNTER — Ambulatory Visit: Payer: BC Managed Care – PPO | Admitting: Family Medicine

## 2018-06-18 ENCOUNTER — Telehealth: Payer: Self-pay | Admitting: Family Medicine

## 2018-06-18 ENCOUNTER — Telehealth: Payer: Self-pay | Admitting: *Deleted

## 2018-06-18 ENCOUNTER — Telehealth: Payer: Self-pay

## 2018-06-18 DIAGNOSIS — R7301 Impaired fasting glucose: Secondary | ICD-10-CM

## 2018-06-18 DIAGNOSIS — E785 Hyperlipidemia, unspecified: Secondary | ICD-10-CM

## 2018-06-18 MED ORDER — DULOXETINE HCL 30 MG PO CPEP: ORAL_CAPSULE | ORAL | 1 refills | Status: DC

## 2018-06-18 NOTE — Telephone Encounter (Signed)
Approved today (Duloxetine) PA Case: [id number], Status: Approved, Coverage Starts on: 01/30/2018 12:00:00 AM, Coverage Ends on: 01/30/2019 12:00:00 AM. Questions? Contact [phone]. Pharmacy aware.

## 2018-06-18 NOTE — Telephone Encounter (Signed)
appt scheduled. Pt would like to go have labs done prior.Marland KitchenMarland KitchenElouise Holloway, [name]

## 2018-06-18 NOTE — Telephone Encounter (Signed)
[name] called and left a message. She is requesting a change in the Duloxetine. She would like Duloxetine 30 mg to take 2 in the am and 1 in the pm. Taking 60 mg in the morning and 30 mg in the evening. Please advise. She would like it sent to [facility].

## 2018-06-18 NOTE — Telephone Encounter (Signed)
Patient advised. She did received a message from the pharmacy stating there is a problem with the prescription. She will call us back if needed.

## 2018-06-18 NOTE — Telephone Encounter (Signed)
lvm asking that she rtn call to schedule a Virtual visit (pt will need a 40 min time slot) for abnormal glucose.[name], [name], CMA

## 2018-06-18 NOTE — Telephone Encounter (Signed)
OK, new rx sent to [facility].

## 2018-06-19 DIAGNOSIS — R7301 Impaired fasting glucose: Secondary | ICD-10-CM | POA: Diagnosis not present

## 2018-06-19 DIAGNOSIS — E785 Hyperlipidemia, unspecified: Secondary | ICD-10-CM | POA: Diagnosis not present

## 2018-06-20 LAB — COMPLETE METABOLIC PANEL WITH GFR
AG Ratio: 1.5 (calc) (ref 1.0–2.5)
ALT: 17 U/L (ref 6–29)
AST: 22 U/L (ref 10–35)
Albumin: 4.1 g/dL (ref 3.6–5.1)
Alkaline phosphatase (APISO): 94 U/L (ref 37–153)
BUN: 17 mg/dL (ref 7–25)
CO2: 26 mmol/L (ref 20–32)
Calcium: 9.6 mg/dL (ref 8.6–10.4)
Chloride: 105 mmol/L (ref 98–110)
Creat: 0.82 mg/dL (ref 0.50–0.99)
GFR, Est African American: 86 mL/min/{1.73_m2} (ref >=60)
GFR, Est Non African American: 74 mL/min/{1.73_m2} (ref >=60)
Globulin: 2.7 g/dL (calc) (ref 1.9–3.7)
Glucose, Bld: 91 mg/dL (ref 65–99)
Potassium: 4.7 mmol/L (ref 3.5–5.3)
Sodium: 140 mmol/L (ref 135–146)
Total Bilirubin: 0.5 mg/dL (ref 0.2–1.2)
Total Protein: 6.8 g/dL (ref 6.1–8.1)

## 2018-06-20 LAB — LIPID PANEL
Cholesterol: 192 mg/dL (ref <200)
HDL: 65 mg/dL (ref >=50)
LDL Cholesterol (Calc): 105 mg/dL (calc) — ABNORMAL HIGH
Non-HDL Cholesterol (Calc): 127 mg/dL (calc) (ref <130)
Total CHOL/HDL Ratio: 3 (calc) (ref <5.0)
Triglycerides: 128 mg/dL (ref <150)

## 2018-06-20 LAB — HEMOGLOBIN A1C
Hgb A1c MFr Bld: 6.1 % of total Hgb — ABNORMAL HIGH (ref <5.7)
Mean Plasma Glucose: 128 (calc)
eAG (mmol/L): 7.1 (calc)

## 2018-06-20 LAB — TSH: TSH: 0.9 mIU/L (ref 0.40–4.50)

## 2018-06-21 ENCOUNTER — Encounter: Payer: Self-pay | Admitting: Family Medicine

## 2018-06-21 ENCOUNTER — Ambulatory Visit (INDEPENDENT_AMBULATORY_CARE_PROVIDER_SITE_OTHER): Payer: Medicare HMO | Admitting: Family Medicine

## 2018-06-21 VITALS — HR 100 | Temp 99.7°F | Ht 61.02 in | Wt 188.7 lb

## 2018-06-21 DIAGNOSIS — J4541 Moderate persistent asthma with (acute) exacerbation: Secondary | ICD-10-CM

## 2018-06-21 DIAGNOSIS — J4530 Mild persistent asthma, uncomplicated: Secondary | ICD-10-CM

## 2018-06-21 DIAGNOSIS — M545 Low back pain: Secondary | ICD-10-CM

## 2018-06-21 DIAGNOSIS — J302 Other seasonal allergic rhinitis: Secondary | ICD-10-CM | POA: Diagnosis not present

## 2018-06-21 DIAGNOSIS — F901 Attention-deficit hyperactivity disorder, predominantly hyperactive type: Secondary | ICD-10-CM

## 2018-06-21 DIAGNOSIS — G8929 Other chronic pain: Secondary | ICD-10-CM | POA: Diagnosis not present

## 2018-06-21 DIAGNOSIS — R7301 Impaired fasting glucose: Secondary | ICD-10-CM

## 2018-06-21 NOTE — Progress Notes (Signed)
Virtual Visit via Video Note  I connected with [name] on 06/21/18 at  3:00 PM EDT by a video enabled telemedicine application and verified that I am speaking with the correct person using two identifiers.   I discussed the limitations of evaluation and management by telemedicine and the availability of in person appointments. The patient expressed understanding and agreed to proceed.  Subjective:    CC: ADD  HPI:  ADD - Reports symptoms are well controlled on current regime. Denies any problems with insomnia, chest pain, palpitations, or SOB.  She has been taking 1.5 tabs most days. She doesn't need a refill yet.   Impaired fasting glucose-no increased thirst or urination. No symptoms consistent with hypoglycemia. Lab Results  Component Value Date   HGBA1C 6.1 (H) 06/19/2018    She would like to being tested to covid as she was sick in Feb and saw one of my partners.  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   She has been having occ sweats when indoor. Fells like it is from her scalp down her body.  Face feels warm to touch.   Asthma - has been using her albuterol more often the last couple of weeks. Hasn't been on her Symbicort.    Seasonal allergies - says she in aM ony blows a green/grey blob with some blood out of her nose.  She has not had any fevers or chills she just wants to make sure this is okay.  She is not currently using any nasal sprays  Past medical history, Surgical history, Family history not pertinant except as noted below, Social history, Allergies, and medications have been entered into the medical record, reviewed, and corrections made.   Review of Systems: No fevers, chills, night sweats, weight loss, chest pain, or shortness of breath.   Objective:    General: Speaking clearly in complete sentences without any shortness of breath.  Alert and oriented x3.  Normal judgment. No apparent acute distress.    Impression and Recommendations:    IFG -  stable.  Globin A1c up slighlty from previous. Recheck on 6 months.  Continue to work on diet and increasing activity.  She admits she is been pretty sedentary over the last couple of months.  ADD - well controlled on current regimen though she is taking half a tab.  Does not need a refill quite yet but okay to refill when the pharmacy requests.  Asthma - needs to start her Symbicort back for the next 2 months. If doing well at that oint then can discontinue it and just use her albuterol PRN.    Temperature change -states was normal on recent labs.  Likely from being post menopausal right now.     Low back pain-has seen Dr. Gladstone Lighter in the past.  She actually did physical therapy back in February and March.  More recently she sort of fallen off with her exercises so just encouraged her to get back on track with those and that should hopefully help.  She feels like the Cymbalta has been helpful as well so we will continue that.  Seasonal allergies it sounds like she is probably getting a little bit of a mucous plug with a little bit of blood that loosens in the morning when she clears out her nose.  Recommend a trial of nasal saline irrigation at bedtime to see if this helps.   I discussed the assessment and treatment plan with the patient. The patient was provided an  opportunity to ask questions and all were answered. The patient agreed with the plan and demonstrated an understanding of the instructions.  Her low back has been bothering her more lately. She has seen Dr. Cay Schillings in the past and diagnosed with spinal stenosi.  Has been using Tylenol at night but more recently has been trying to back off the tylenol.  Seasonal allergies - says she in aM ony blows a green/grey blob with some blood out of her nose.   Time spent 40 minutes, greater than 50% time spent in counseling about back pain, seasonal allergies, temperature change/hot flashes, impaired fasting glucose, moderate persistent asthma,  and ADD   The patient was advised to call back or seek an in-person evaluation if the symptoms worsen or if the condition fails to improve as anticipated.   Beatrice Lecher, MD

## 2018-07-18 ENCOUNTER — Encounter: Payer: Self-pay | Admitting: Family Medicine

## 2018-07-18 ENCOUNTER — Ambulatory Visit (INDEPENDENT_AMBULATORY_CARE_PROVIDER_SITE_OTHER): Payer: Medicare HMO | Admitting: Family Medicine

## 2018-07-18 VITALS — BP 128/71 | HR 95 | Ht 61.0 in | Wt 189.0 lb

## 2018-07-18 DIAGNOSIS — M7918 Myalgia, other site: Secondary | ICD-10-CM

## 2018-07-18 DIAGNOSIS — R400 Somnolence: Secondary | ICD-10-CM | POA: Diagnosis not present

## 2018-07-18 DIAGNOSIS — E785 Hyperlipidemia, unspecified: Secondary | ICD-10-CM | POA: Diagnosis not present

## 2018-07-18 DIAGNOSIS — F901 Attention-deficit hyperactivity disorder, predominantly hyperactive type: Secondary | ICD-10-CM

## 2018-07-18 DIAGNOSIS — G8929 Other chronic pain: Secondary | ICD-10-CM

## 2018-07-18 DIAGNOSIS — L659 Nonscarring hair loss, unspecified: Secondary | ICD-10-CM | POA: Insufficient documentation

## 2018-07-18 DIAGNOSIS — R7301 Impaired fasting glucose: Secondary | ICD-10-CM | POA: Diagnosis not present

## 2018-07-18 DIAGNOSIS — R748 Abnormal levels of other serum enzymes: Secondary | ICD-10-CM | POA: Diagnosis not present

## 2018-07-18 DIAGNOSIS — N3281 Overactive bladder: Secondary | ICD-10-CM | POA: Diagnosis not present

## 2018-07-18 MED ORDER — AMPHETAMINE-DEXTROAMPHETAMINE 20 MG PO TABS: 20.0000 mg | ORAL_TABLET | Freq: Two times a day (BID) | ORAL | 0 refills | Status: DC | PRN

## 2018-07-18 MED ORDER — CLINDAMYCIN PHOSPHATE 1 % EX SOLN: Freq: Two times a day (BID) | CUTANEOUS | 0 refills | Status: DC

## 2018-07-18 NOTE — Assessment & Plan Note (Addendum)
Continue to avoid caffeine and bladder irritants.Working with urology on this.  She is quiet tried a couple of the other medications for this but the one that she feels the best on is Toviaz.  She actually had itching with Vesicare.  It caused itchy ears and eyes.

## 2018-07-18 NOTE — Progress Notes (Signed)
Acute Office Visit  Subjective:    Patient ID: [name], female    DOB: Jun 01, 1950, 68 y.o.   MRN: [id number]  Chief Complaint  Patient presents with  . Alopecia    HPI Patient is in today for Hair loss and bumps on scalp.  She said she noticed that she was getting a few little bumps on the back of her scalp.  Not so much on the top of her head.  She says she would then pick at them and squeeze it them and they form scabs.  Sometimes they can get a little itchy.  She actually just bought some tea tree oil shampoo.  She also tried using a facial scrub that has some charcoal in it and that actually seemed to help the bumps.  He is also tried Neosporin and that sometimes helps.  She notes that she also sometimes gets some ingrown hairs on her chin that she did not used to get.  Again she will try to squeeze at those and sometimes get something out of it.  She also noticed that she has some occasional bumps particularly on the right outer upper arm.  She never seems to notice them quite so much on her left arm.  She says it started not to pick and scratch at them.  She also reports that she has been noticing that she is getting more thin hair on the top of her head.  It does not seem to be coming out and just single spots or chunks.  It is just overall thinning on the top of the scalp.  She wanted to know if it would be okay to take a supplement called visical.  She also currently has a small bump on that left inner ear.  She also has overactive bladder and unfortunately has tried a couple different medications and has not tolerated them well.  They more recently put her on Toviaz and it seemed to work well but unfortunately her co-pay is quite expensive and she is hoping to get a tier exception.  She is also worked on cutting back on her caffeine and soda intake to see if that would help as well.  She also wanted to let me know that she did cut back on her Cymbalta.  She is just doing 2  caps in the morning.  She got rid of the evening dose because she felt like it really was not making a big difference.  Instead she has been doing 2 Tylenol twice a day and that does seem to be helping.  In regards to her ADHD-she does need her Adderall sent to the pharmacy.  She has a new mail order pharmacy and has not set up her account yet.  She is also concerned because she feels like she is been sleeping a little too much during the daytime.  Past Medical History:  Diagnosis Date  . ADD (attention deficit disorder)   . Arthritis    "bunion on  right" (08/11/2014)  . Chronic bronchitis ([place])    "plenty; but not q yr" (08/11/2014)  . Depression   . Dyslipidemia   . Environmental allergies   . GERD (gastroesophageal reflux disease)   . Hearing difficulty of left ear    "grew up w/deaf parent; find myself lip reading more recently" (08/11/2014)  . Hepatitis    "exposed in college; rec'd gamma globulin; 6 months later S/S (weakness, lethargy, fevers); ; dx'd infectious hepatitis"  . History of herpes  genitalis    "stress induced reoccurance that shows up on my left middle finger" (08/11/2014)  . History of hiatal hernia   . History of stomach ulcers   . Hypercholesterolemia   . Hyperthyroidism 1994   "postpartum only; resolved itself"  . Incisional hernia   . Internal hemorrhoids   . Leg cramping   . Osteopenia   . Seasonal asthma   . Stress incontinence, female   . Ulcer     Past Surgical History:  Procedure Laterality Date  . CESAREAN SECTION  1994  . CHALAZION EXCISION Left ~ 2013  . DILATION AND CURETTAGE OF UTERUS  X 2  . EYE SURGERY    . HERNIA REPAIR  08/11/2014  . INCISIONAL HERNIA REPAIR N/A 08/11/2014   Procedure: LAPAROSCOPIC INCISIONAL HERNIA REPAIR;  Surgeon: Coralie Keens, MD;  Location: [place] [facility];  Service: General;  Laterality: N/A;  . INCONTINENCE SURGERY  2005  . INGUINAL HERNIA REPAIR Left 08/11/2014  . INGUINAL HERNIA REPAIR Left 08/11/2014   Procedure:  LEFT INGUINAL HERNIA REPAIR;  Surgeon: Coralie Keens, MD;  Location: [facility];  Service: General;  Laterality: Left;  . INSERTION OF MESH Left 08/11/2014   Procedure: INSERTION OF MESH TO LEFT GROIN AND ABDOMEN;  Surgeon: Coralie Keens, MD;  Location: [facility];  Service: General;  Laterality: Left;  . LAPAROSCOPIC INCISIONAL / UMBILICAL / VENTRAL HERNIA REPAIR  08/11/2014   [facility]  . NASAL POLYP EXCISION  2014  . REPAIR OF PERFORATED ULCER  1985  . TONSILLECTOMY    . TOTAL SHOULDER ARTHROPLASTY Left 09/21/2017   Procedure: LEFT TOTAL SHOULDER ARTHROPLASTY;  Surgeon: Nicholes Stairs, MD;  Location: [place];  Service: Orthopedics;  Laterality: Left;    Family History  Problem Relation Age of Onset  . Arthritis Mother   . Heart disease Mother        CABG @ 69  . Diabetes Mother   . Osteoporosis Mother   . Hyperlipidemia Mother   . Arthritis Father   . Lymphoma Father 5       chemo  . Cancer Father        lymphoma  . Lymphoma Paternal Grandmother   . Diabetes Maternal Grandmother   . Colon cancer Neg Hx   . Rectal cancer Neg Hx   . Esophageal cancer Neg Hx     Social History   Socioeconomic History  . Marital status: Divorced    Spouse name: Not on file  . Number of children: Not on file  . Years of education: Not on file  . Highest education level: Not on file  Occupational History  . Occupation: Consulting civil engineer    Comment: Full time; Barista  Social Needs  . Financial resource strain: Not on file  . Food insecurity    Worry: Not on file    Inability: Not on file  . Transportation needs    Medical: Not on file    Non-medical: Not on file  Tobacco Use  . Smoking status: Never Smoker  . Smokeless tobacco: Never Used  Substance and Sexual Activity  . Alcohol use: Yes    Alcohol/week: 2.0 standard drinks    Types: 1 Glasses of wine, 1 Shots of liquor per week    Comment: 08/11/2014 "0-2 glasses of wine or a mixed drink q wk"  . Drug use: No   . Sexual activity: Never    Birth control/protection: Post-menopausal  Lifestyle  . Physical activity    Days per  week: Not on file    Minutes per session: Not on file  . Stress: Not on file  Relationships  . Social Herbalist on phone: Not on file    Gets together: Not on file    Attends religious service: Not on file    Active member of club or organization: Not on file    Attends meetings of clubs or organizations: Not on file    Relationship status: Not on file  . Intimate partner violence    Fear of current or ex partner: Not on file    Emotionally abused: Not on file    Physically abused: Not on file    Forced sexual activity: Not on file  Other Topics Concern  . Not on file  Social History Narrative   Marital status: divorced; not dating      Children: 1 daughter; no grandchildren      Lives:        Employment: Optometrist      Tobacco:       Alcohol:      Drugs:       Exercise:     Outpatient Medications Prior to Visit  Medication Sig Dispense Refill  . acetaminophen (TYLENOL) 650 MG CR tablet Take 1,300 mg by mouth See admin instructions. Take 1,300 mg by mouth at bedtime and may also take an additional 1,300 mg one to two times a day as needed for pain    . albuterol (PROAIR HFA) 108 (90 Base) MCG/ACT inhaler Inhale 2 puffs into the lungs every 6 (six) hours as needed for wheezing or shortness of breath. 3 Inhaler 0  . Biotin 5000 MCG CAPS Take 1 tablet by mouth daily.     . Calcium Carb-Cholecalciferol (CALCIUM+D3 PO) Take 2 tablets by mouth daily.     . cyclobenzaprine (FLEXERIL) 10 MG tablet Take 10 mg by mouth at bedtime as needed (for sleep or pain).     . DULoxetine (CYMBALTA) 30 MG capsule 2 caps orally in AM and 1 in PM 270 capsule 1  . ipratropium (ATROVENT) 0.03 % nasal spray Place 2 sprays into the nose 4 (four) times daily. Use as needed for runny nose and congestion 30 mL 1  . loratadine (CLARITIN) 10 MG tablet Take 1 tablet (10 mg  total) by mouth daily. 90 tablet 3  . Misc Natural Products (GLUCOSAMINE CHONDROITIN MSM PO) Take 1,500 mg by mouth daily.    . mometasone (NASONEX) 50 MCG/ACT nasal spray INHALE 2 SPRAYS INTO THE NOSE DAILY AS NEEDED (Patient taking differently: Place 2 sprays into the nose daily as needed (for inflammation). ) 17 g 5  . Multiple Vitamins-Minerals (ONE-A-DAY WOMENS 50 PLUS) TABS Take 1 tablet by mouth daily.    . simvastatin (ZOCOR) 40 MG tablet Take 1 tablet (40 mg total) by mouth daily. (Patient taking differently: Take 40 mg by mouth at bedtime. ) 90 tablet 1  . Turmeric Curcumin 500 MG CAPS Take 1 capsule by mouth daily.    Marland Kitchen amphetamine-dextroamphetamine (ADDERALL) 20 MG tablet Take 1 tablet (20 mg total) by mouth 2 (two) times daily as needed. 180 tablet 0  . solifenacin (VESICARE) 10 MG tablet Take 1 tablet by mouth daily.    . budesonide-formoterol (SYMBICORT) 160-4.5 MCG/ACT inhaler Inhale 2 puffs into the lungs 2 (two) times daily. (Patient not taking: Reported on 06/21/2018) 1 Inhaler 3   No facility-administered medications prior to visit.     Allergies  Allergen  Reactions  . Aspirin Other (See Comments)    GI Intolerance; History of ulcers; GI BLEED  . Bee Venom Swelling    Any insects that bite or stings- swelling at site of sting or cellulitis can develop  . Nsaids Other (See Comments)    History of ulcers  . Vesicare [Solifenacin] Itching    ROS     Objective:    Physical Exam  BP 128/71   Pulse 95   Ht 5\' 1"  (1.549 m)   Wt 189 lb (85.7 kg)   LMP 08/30/2008   SpO2 100%   BMI 35.71 kg/m  Wt Readings from Last 3 Encounters:  07/18/18 189 lb (85.7 kg)  06/21/18 188 lb 11.2 oz (85.6 kg)  03/12/18 194 lb (88 kg)    There are no preventive care reminders to display for this patient.  There are no preventive care reminders to display for this patient.   Lab Results  Component Value Date   TSH 0.90 06/19/2018   Lab Results  Component Value Date   WBC  7.4 11/13/2017   HGB 13.7 11/13/2017   HCT 41.7 11/13/2017   MCV 83.4 11/13/2017   PLT 418 (H) 11/13/2017   Lab Results  Component Value Date   NA 140 06/19/2018   K 4.7 06/19/2018   CO2 26 06/19/2018   GLUCOSE 91 06/19/2018   BUN 17 06/19/2018   CREATININE 0.82 06/19/2018   BILITOT 0.5 06/19/2018   ALKPHOS 104 07/02/2017   AST 22 06/19/2018   ALT 17 06/19/2018   PROT 6.8 06/19/2018   ALBUMIN 4.3 07/02/2017   CALCIUM 9.6 06/19/2018   ANIONGAP 7 09/22/2017   GFR 74.97 03/19/2013   Lab Results  Component Value Date   CHOL 192 06/19/2018   Lab Results  Component Value Date   HDL 65 06/19/2018   Lab Results  Component Value Date   LDLCALC 105 (H) 06/19/2018   Lab Results  Component Value Date   TRIG 128 06/19/2018   Lab Results  Component Value Date   CHOLHDL 3.0 06/19/2018   Lab Results  Component Value Date   HGBA1C 6.1 (H) 06/19/2018       Assessment & Plan:   Problem List Items Addressed This Visit      Genitourinary   OAB (overactive bladder)    Continue to avoid caffeine and bladder irritants.Working with urology on this.  She is quiet tried a couple of the other medications for this but the one that she feels the best on is Toviaz.  She actually had itching with Vesicare.  It caused itchy ears and eyes.      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  Discussed with her that we can consider treatments such as minoxidil.  She would like to try the supplement that she mentioned.  I am not exactly sure what is in it but if she does try it and finds that it is not helping I will be happy to discuss the  minoxidil again.  Relevant Orders   COMPLETE METABOLIC PANEL WITH GFR   Lipid panel   TSH   B12 and Folate Panel   Vitamin B6   Vitamin B1   Zinc   Magnesium   Chronic musculoskeletal pain    Doing okay with just 2 tabs of the Cymbalta in the morning.  Dropped off the evening dose and has been doing well with just taking some Tylenol.      ADD (attention deficit disorder)    Will send over 30-day supply to local pharmacy and she will see if she can get set up with her mail order and hopefully we can order a 90-day supply after that.       Other Visit Diagnoses    Daytime somnolence       Relevant Orders   COMPLETE METABOLIC PANEL WITH GFR   Lipid panel   TSH   B12 and Folate Panel   Vitamin B6   Vitamin B1   Zinc   Magnesium     Rash on scalp-suspect that these are probably just plugged follicles.  She does pick at them and scratch at them until they are sores.  We discussed trying not to touch them if at all possible and I do think a tea tree oil shampoo would be helpful.  I am also going to send over topical clindamycin gel that she can apply to the lesions.  Encouraged her not to squeeze it them.  Keratosis Pilaris-I think the bumps on her upper outer arm are most consistent with keratosis Pilar's and again just encouraged her not to pick and scratch.  She can exfoliate and use a good moisturizer and that should help.  Meds ordered this encounter  Medications  . amphetamine-dextroamphetamine (ADDERALL) 20 MG tablet    Sig: Take 1 tablet (20 mg total) by mouth 2 (two) times daily as needed.    Dispense:  60 tablet    Refill:  0  . clindamycin (CLEOCIN-T) 1 % external solution    Sig: Apply topically 2 (two) times daily. To affected area/bumps    Dispense:  30 mL    Refill:  0     Beatrice Lecher, MD

## 2018-07-18 NOTE — Assessment & Plan Note (Signed)
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  Discussed with her that we can consider treatments such as minoxidil.  She would like to try the supplement that she mentioned.  I am not exactly sure what is in it but if she does try it and finds that it is not helping I will be happy to discuss the minoxidil again.

## 2018-07-18 NOTE — Assessment & Plan Note (Signed)
Will send over 30-day supply to local pharmacy and she will see if she can get set up with her mail order and hopefully we can order a 90-day supply after that.

## 2018-07-18 NOTE — Assessment & Plan Note (Signed)
Doing okay with just 2 tabs of the Cymbalta in the morning.  Dropped off the evening dose and has been doing well with just taking some Tylenol.

## 2018-07-21 LAB — COMPLETE METABOLIC PANEL WITH GFR
AG Ratio: 1.8 (calc) (ref 1.0–2.5)
ALT: 20 U/L (ref 6–29)
AST: 20 U/L (ref 10–35)
Albumin: 4.4 g/dL (ref 3.6–5.1)
Alkaline phosphatase (APISO): 79 U/L (ref 37–153)
BUN: 18 mg/dL (ref 7–25)
CO2: 30 mmol/L (ref 20–32)
Calcium: 9.5 mg/dL (ref 8.6–10.4)
Chloride: 106 mmol/L (ref 98–110)
Creat: 0.81 mg/dL (ref 0.50–0.99)
GFR, Est African American: 87 mL/min/{1.73_m2} (ref >=60)
GFR, Est Non African American: 75 mL/min/{1.73_m2} (ref >=60)
Globulin: 2.5 g/dL (calc) (ref 1.9–3.7)
Glucose, Bld: 104 mg/dL — ABNORMAL HIGH (ref 65–99)
Potassium: 4.8 mmol/L (ref 3.5–5.3)
Sodium: 142 mmol/L (ref 135–146)
Total Bilirubin: 0.3 mg/dL (ref 0.2–1.2)
Total Protein: 6.9 g/dL (ref 6.1–8.1)

## 2018-07-21 LAB — B12 AND FOLATE PANEL
Folate: 21.4 ng/mL
Vitamin B-12: 599 pg/mL (ref 200–1100)

## 2018-07-21 LAB — LIPID PANEL
Cholesterol: 206 mg/dL — ABNORMAL HIGH (ref <200)
HDL: 64 mg/dL (ref >=50)
LDL Cholesterol (Calc): 120 mg/dL (calc) — ABNORMAL HIGH
Non-HDL Cholesterol (Calc): 142 mg/dL (calc) — ABNORMAL HIGH (ref <130)
Total CHOL/HDL Ratio: 3.2 (calc) (ref <5.0)
Triglycerides: 116 mg/dL (ref <150)

## 2018-07-21 LAB — ZINC: Zinc: 74 ug/dL (ref 60–130)

## 2018-07-21 LAB — VITAMIN B1: Vitamin B1 (Thiamine): 35 nmol/L — ABNORMAL HIGH (ref 8–30)

## 2018-07-21 LAB — MAGNESIUM: Magnesium: 2.3 mg/dL (ref 1.5–2.5)

## 2018-07-21 LAB — TSH: TSH: 0.84 mIU/L (ref 0.40–4.50)

## 2018-07-21 LAB — VITAMIN B6: Vitamin B6: 46.5 ng/mL — ABNORMAL HIGH (ref 2.1–21.7)

## 2018-07-24 ENCOUNTER — Other Ambulatory Visit: Payer: Self-pay | Admitting: *Deleted

## 2018-07-24 MED ORDER — SIMVASTATIN 40 MG PO TABS: 40.0000 mg | ORAL_TABLET | Freq: Every day | ORAL | 4 refills | Status: DC

## 2018-07-29 ENCOUNTER — Encounter: Payer: Self-pay | Admitting: Family Medicine

## 2018-07-31 NOTE — Telephone Encounter (Signed)
I called pt and offered her an appointment with Dr. Madilyn Fireman but she feels like the bump has gone down and she does not need an appointment at this time. Thanks

## 2018-09-04 NOTE — Progress Notes (Unsigned)
Subjective:   [name] is a 68 y.o. female who presents for an Initial Medicare Annual Wellness Visit.  Review of Systems    No ROS.  Medicare Wellness Virtual Visit.  Visual/audio telehealth visit, UTA vital signs.   See social history for additional risk factors.       Sleep patterns: {SX; SLEEP PATTERNS:18802::"feels rested on waking","does not get up to void","gets up *** times nightly to void","sleeps *** hours nightly"}.   Home Safety/Smoke Alarms: Feels safe in home. Smoke alarms in place.  Living environment; residence and Firearm Safety: {Rehab home environment / accessibility:30080::"no firearms","firearms stored safely"}. Seat Belt Safety/Bike Helmet: Wears seat belt.   Female:   Pap-  Aged out     Mammo- UTD      Dexa scan-        CCS- UTD     Objective:    There were no vitals filed for this visit. There is no height or weight on file to calculate BMI.  Advanced Directives 10/08/2017 09/21/2017 09/10/2017 08/12/2014 08/11/2014 08/10/2014  Does Patient Have a Medical Advance Directive? Yes Yes No No No No  Type of Advance Directive Healthcare Power of [place]  Does patient want to make changes to medical advance directive? - Yes (Inpatient - patient requests chaplain consult to change a medical advance directive) - - - -  Copy of [place] in Chart? Yes Yes - - - -  Would patient like information on creating a medical advance directive? Yes (Inpatient - patient requests chaplain consult to create a medical advance directive) Yes (Inpatient - patient requests chaplain consult to create a medical advance directive) No - Patient declined Yes - Educational materials given;No - patient declined information Yes - Spiritual care consult ordered Yes - Educational materials given    Current Medications (verified) Outpatient Encounter Medications as of 09/09/2018  Medication Sig  . acetaminophen (TYLENOL) 650 MG CR  tablet Take 1,300 mg by mouth See admin instructions. Take 1,300 mg by mouth at bedtime and may also take an additional 1,300 mg one to two times a day as needed for pain  . albuterol (PROAIR HFA) 108 (90 Base) MCG/ACT inhaler Inhale 2 puffs into the lungs every 6 (six) hours as needed for wheezing or shortness of breath.  . amphetamine-dextroamphetamine (ADDERALL) 20 MG tablet Take 1 tablet (20 mg total) by mouth 2 (two) times daily as needed.  . Biotin 5000 MCG CAPS Take 1 tablet by mouth daily.   . budesonide-formoterol (SYMBICORT) 160-4.5 MCG/ACT inhaler Inhale 2 puffs into the lungs 2 (two) times daily. (Patient not taking: Reported on 06/21/2018)  . Calcium Carb-Cholecalciferol (CALCIUM+D3 PO) Take 2 tablets by mouth daily.   . clindamycin (CLEOCIN-T) 1 % external solution Apply topically 2 (two) times daily. To affected area/bumps  . cyclobenzaprine (FLEXERIL) 10 MG tablet Take 10 mg by mouth at bedtime as needed (for sleep or pain).   . DULoxetine (CYMBALTA) 30 MG capsule 2 caps orally in AM and 1 in PM  . ipratropium (ATROVENT) 0.03 % nasal spray Place 2 sprays into the nose 4 (four) times daily. Use as needed for runny nose and congestion  . loratadine (CLARITIN) 10 MG tablet Take 1 tablet (10 mg total) by mouth daily.  . Misc Natural Products (GLUCOSAMINE CHONDROITIN MSM PO) Take 1,500 mg by mouth daily.  . mometasone (NASONEX) 50 MCG/ACT nasal spray INHALE 2 SPRAYS INTO THE NOSE DAILY AS NEEDED (  Patient taking differently: Place 2 sprays into the nose daily as needed (for inflammation). )  . Multiple Vitamins-Minerals (ONE-A-DAY WOMENS 50 PLUS) TABS Take 1 tablet by mouth daily.  . simvastatin (ZOCOR) 40 MG tablet Take 1 tablet (40 mg total) by mouth at bedtime.  . Turmeric Curcumin 500 MG CAPS Take 1 capsule by mouth daily.   No facility-administered encounter medications on file as of 09/09/2018.     Allergies (verified) Aspirin, Bee venom, Nsaids, and Vesicare [solifenacin]    History: Past Medical History:  Diagnosis Date  . ADD (attention deficit disorder)   . Arthritis    "bunion on  right" (08/11/2014)  . Chronic bronchitis ([place])    "plenty; but not q yr" (08/11/2014)  . Depression   . Dyslipidemia   . Environmental allergies   . GERD (gastroesophageal reflux disease)   . Hearing difficulty of left ear    "grew up w/deaf parent; find myself lip reading more recently" (08/11/2014)  . Hepatitis    "exposed in college; rec'd gamma globulin; 6 months later S/S (weakness, lethargy, fevers); ; dx'd infectious hepatitis"  . History of herpes genitalis    "stress induced reoccurance that shows up on my left middle finger" (08/11/2014)  . History of hiatal hernia   . History of stomach ulcers   . Hypercholesterolemia   . Hyperthyroidism 1994   "postpartum only; resolved itself"  . Incisional hernia   . Internal hemorrhoids   . Leg cramping   . Osteopenia   . Seasonal asthma   . Stress incontinence, female   . Ulcer    Past Surgical History:  Procedure Laterality Date  . CESAREAN SECTION  1994  . CHALAZION EXCISION Left ~ 2013  . DILATION AND CURETTAGE OF UTERUS  X 2  . EYE SURGERY    . HERNIA REPAIR  08/11/2014  . INCISIONAL HERNIA REPAIR N/A 08/11/2014   Procedure: LAPAROSCOPIC INCISIONAL HERNIA REPAIR;  Surgeon: Coralie Keens, MD;  Location: [facility];  Service: General;  Laterality: N/A;  . INCONTINENCE SURGERY  2005  . INGUINAL HERNIA REPAIR Left 08/11/2014  . INGUINAL HERNIA REPAIR Left 08/11/2014   Procedure: LEFT INGUINAL HERNIA REPAIR;  Surgeon: Coralie Keens, MD;  Location: [facility];  Service: General;  Laterality: Left;  . INSERTION OF MESH Left 08/11/2014   Procedure: INSERTION OF MESH TO LEFT GROIN AND ABDOMEN;  Surgeon: Coralie Keens, MD;  Location: [facility];  Service: General;  Laterality: Left;  . LAPAROSCOPIC INCISIONAL / UMBILICAL / VENTRAL HERNIA REPAIR  08/11/2014   [facility]  . NASAL POLYP EXCISION  2014  . REPAIR OF PERFORATED ULCER   1985  . TONSILLECTOMY    . TOTAL SHOULDER ARTHROPLASTY Left 09/21/2017   Procedure: LEFT TOTAL SHOULDER ARTHROPLASTY;  Surgeon: Nicholes Stairs, MD;  Location: [facility];  Service: Orthopedics;  Laterality: Left;   Family History  Problem Relation Age of Onset  . Arthritis Mother   . Heart disease Mother        CABG @ 59  . Diabetes Mother   . Osteoporosis Mother   . Hyperlipidemia Mother   . Arthritis Father   . Lymphoma Father 24       chemo  . Cancer Father        lymphoma  . Lymphoma Paternal Grandmother   . Diabetes Maternal Grandmother   . Colon cancer Neg Hx   . Rectal cancer Neg Hx   . Esophageal cancer Neg Hx    Social History  Socioeconomic History  . Marital status: Divorced    Spouse name: Not on file  . Number of children: Not on file  . Years of education: Not on file  . Highest education level: Not on file  Occupational History  . Occupation: Consulting civil engineer    Comment: Full time; Barista  Social Needs  . Financial resource strain: Not on file  . Food insecurity    Worry: Not on file    Inability: Not on file  . Transportation needs    Medical: Not on file    Non-medical: Not on file  Tobacco Use  . Smoking status: Never Smoker  . Smokeless tobacco: Never Used  Substance and Sexual Activity  . Alcohol use: Yes    Alcohol/week: 2.0 standard drinks    Types: 1 Glasses of wine, 1 Shots of liquor per week    Comment: 08/11/2014 "0-2 glasses of wine or a mixed drink q wk"  . Drug use: No  . Sexual activity: Never    Birth control/protection: Post-menopausal  Lifestyle  . Physical activity    Days per week: Not on file    Minutes per session: Not on file  . Stress: Not on file  Relationships  . Social Herbalist on phone: Not on file    Gets together: Not on file    Attends religious service: Not on file    Active member of club or organization: Not on file    Attends meetings of clubs or organizations: Not  on file    Relationship status: Not on file  Other Topics Concern  . Not on file  Social History Narrative   Marital status: divorced; not dating      Children: 1 daughter; no grandchildren      Lives:        Employment: Optometrist      Tobacco:       Alcohol:      Drugs:       Exercise:     Tobacco Counseling Counseling given: Not Answered   Clinical Intake:                        Activities of Daily Living In your present state of health, do you have any difficulty performing the following activities: 09/22/2017 09/10/2017  Hearing? N N  Vision? N N  Difficulty concentrating or making decisions? N N  Walking or climbing stairs? N N  Dressing or bathing? N N  Doing errands, shopping? N -  Some recent data might be hidden     Immunizations and Health Maintenance Immunization History  Administered Date(s) Administered  . Influenza Inj Mdck Quad With Preservative 10/24/2016  . Influenza Split 12/01/2010  . Influenza, High Dose Seasonal PF 10/02/2017  . Influenza,inj,Quad PF,6+ Mos 11/29/2015  . Influenza-Unspecified 09/30/2012, 10/08/2013, 10/31/2014  . Pneumococcal Conjugate-13 11/29/2015  . Pneumococcal Polysaccharide-23 05/28/2017  . Tdap 12/30/2009  . Zoster 09/30/2012  . Zoster Recombinat (Shingrix) 11/13/2017   Health Maintenance Due  Topic Date Due  . INFLUENZA VACCINE  08/31/2018    Patient Care Team: Hali Marry, MD as PCP - General (Family Medicine) Woodroe Mode, MD (Obstetrics and Gynecology) Thornell Sartorius, MD (Otolaryngology) Nicholaus Bloom, MD (Psychiatry)  Indicate any recent Medical Services you may have received from other than [facility] providers in the past year (date may be approximate).     Assessment:   This is a  routine wellness examination for [name].Physical assessment deferred to PCP.   Hearing/Vision screen  Hearing Screening   125Hz  250Hz  500Hz  1000Hz  2000Hz  3000Hz  4000Hz  6000Hz  8000Hz   Right  ear:           Left ear:           Comments: Hearing test not done, visit done via telephone due to [place] pandemic  Vision Screening Comments: Vision test not done, visit done via telephone due to [place] pandemic  Dietary issues and exercise activities discussed:   Diet Breakfast: Lunch:  Dinner:       Goals   None    Depression Screen PHQ 2/9 Scores 06/21/2018 02/05/2018 12/17/2017 08/03/2017 07/02/2017 05/28/2017 01/11/2017  PHQ - 2 Score 0 0 0 0 0 0 0  PHQ- 9 Score - 5 4 2  - - -    Fall Risk Fall Risk  07/02/2017 05/28/2017 01/11/2017 12/09/2016 12/09/2016  Falls in the past year? No No No No No  Number falls in past yr: - - - - -  Injury with Fall? - - - - -    Is the patient's home free of loose throw rugs in walkways, pet beds, electrical cords, etc?   {Blank single:19197::"yes","no"}      Grab bars in the bathroom? {Blank single:19197::"yes","no"}      Handrails on the stairs?   {Blank single:19197::"yes","no"}      Adequate lighting?   {Blank single:19197::"yes","no"}   Cognitive Function:        Screening Tests Health Maintenance  Topic Date Due  . INFLUENZA VACCINE  08/31/2018  . MAMMOGRAM  08/15/2019  . TETANUS/TDAP  12/31/2019  . COLONOSCOPY  01/21/2021  . DEXA SCAN  Completed  . Hepatitis C Screening  Completed  . PNA vac Low Risk Adult  Completed       Plan:   ***  I have personally reviewed and noted the following in the patient's chart:   . Medical and social history . Use of alcohol, tobacco or illicit drugs  . Current medications and supplements . Functional ability and status . Nutritional status . Physical activity . Advanced directives . List of other physicians . Hospitalizations, surgeries, and ER visits in previous 12 months . Vitals . Screenings to include cognitive, depression, and falls . Referrals and appointments  In addition, I have reviewed and discussed with patient certain preventive protocols, quality metrics, and best  practice recommendations. A written personalized care plan for preventive services as well as general preventive health recommendations were provided to patient.     Joanne Chars, LPN   09/09/9145

## 2018-09-09 ENCOUNTER — Ambulatory Visit: Payer: Medicare HMO

## 2018-09-11 ENCOUNTER — Ambulatory Visit: Payer: Medicare HMO

## 2018-09-12 ENCOUNTER — Encounter: Payer: Self-pay | Admitting: Family Medicine

## 2018-09-17 NOTE — Progress Notes (Signed)
Subjective:   [name] is a 68 y.o. female who presents for an Initial Medicare Annual Wellness Visit.  Review of Systems    No ROS.  Medicare Wellness Virtual Visit.  Visual/audio telehealth visit, UTA vital signs.   See social history for additional risk factors.     Cardiac Risk Factors include: advanced age (>93men, >62 women);sedentary lifestyle Sleep patterns: Getting 4 hours of sleep a night. Wakes up 3-4 times a night to void. Wakes up feeling refreshed. Home Safety/Smoke Alarms: Feels safe in home. Smoke alarms in place.  Living environment; Lives with daughter in 1 story apartment. Stairs have hand rails on them and they have an elevator. Shower is a walk in shower  With grab bars in place. Seat Belt Safety/Bike Helmet: Wears seat belt.   Female:   Pap- Aged out      Mammo- UTD      Dexa scan-  UTD      CCS- UTD     Objective:    Today's Vitals   09/25/18 1148  BP: 118/69  Pulse: 88  SpO2: 98%  Weight: 193 lb (87.5 kg)   Body mass index is 36.47 kg/m.  Advanced Directives 09/25/2018 10/08/2017 09/21/2017 09/10/2017 08/12/2014 08/11/2014 08/10/2014  Does Patient Have a Medical Advance Directive? No Yes Yes No No No No  Type of Advance Directive - Healthcare Power of [place] Number Two  Does patient want to make changes to medical advance directive? - - Yes (Inpatient - patient requests chaplain consult to change a medical advance directive) - - - -  Copy of [place] in Chart? - Yes Yes - - - -  Would patient like information on creating a medical advance directive? Yes (MAU/Ambulatory/Procedural Areas - Information given) Yes (Inpatient - patient requests chaplain consult to create a medical advance directive) Yes (Inpatient - patient requests chaplain consult to create a medical advance directive) No - Patient declined Yes - Educational materials given;No - patient declined information Yes - Spiritual care consult  ordered Yes - Educational materials given    Current Medications (verified) Outpatient Encounter Medications as of 09/25/2018  Medication Sig  . acetaminophen (TYLENOL) 650 MG CR tablet Take 1,300 mg by mouth See admin instructions. Take 1,300 mg by mouth at bedtime and may also take an additional 1,300 mg one to two times a day as needed for pain  . albuterol (PROAIR HFA) 108 (90 Base) MCG/ACT inhaler Inhale 2 puffs into the lungs every 6 (six) hours as needed for wheezing or shortness of breath.  . amphetamine-dextroamphetamine (ADDERALL) 20 MG tablet Take 1 tablet (20 mg total) by mouth 2 (two) times daily as needed.  . Biotin 5000 MCG CAPS Take 1 tablet by mouth daily.   . budesonide-formoterol (SYMBICORT) 160-4.5 MCG/ACT inhaler Inhale 2 puffs into the lungs 2 (two) times daily.  . Calcium Carb-Cholecalciferol (CALCIUM+D3 PO) Take 2 tablets by mouth daily.   . clindamycin (CLEOCIN-T) 1 % external solution Apply topically 2 (two) times daily. To affected area/bumps  . cyclobenzaprine (FLEXERIL) 10 MG tablet Take 10 mg by mouth at bedtime as needed (for sleep or pain).   . DULoxetine (CYMBALTA) 30 MG capsule 2 caps orally in AM and 1 in PM  . ipratropium (ATROVENT) 0.03 % nasal spray Place 2 sprays into the nose 4 (four) times daily. Use as needed for runny nose and congestion  . loratadine (CLARITIN) 10 MG tablet Take 1 tablet (  10 mg total) by mouth daily.  . Misc Natural Products (GLUCOSAMINE CHONDROITIN MSM PO) Take 1,500 mg by mouth daily.  . mometasone (NASONEX) 50 MCG/ACT nasal spray INHALE 2 SPRAYS INTO THE NOSE DAILY AS NEEDED (Patient taking differently: Place 2 sprays into the nose daily as needed (for inflammation). )  . Multiple Vitamins-Minerals (ONE-A-DAY WOMENS 50 PLUS) TABS Take 1 tablet by mouth daily.  . simvastatin (ZOCOR) 40 MG tablet Take 1 tablet (40 mg total) by mouth at bedtime.  . Turmeric Curcumin 500 MG CAPS Take 1 capsule by mouth daily.   No facility-administered  encounter medications on file as of 09/25/2018.     Allergies (verified) Aspirin, Bee venom, Nsaids, and Vesicare [solifenacin]   History: Past Medical History:  Diagnosis Date  . ADD (attention deficit disorder)   . Arthritis    "bunion on  right" (08/11/2014)  . Chronic bronchitis ([place])    "plenty; but not q yr" (08/11/2014)  . Depression   . Dyslipidemia   . Environmental allergies   . GERD (gastroesophageal reflux disease)   . Hearing difficulty of left ear    "grew up w/deaf parent; find myself lip reading more recently" (08/11/2014)  . Hepatitis    "exposed in college; rec'd gamma globulin; 6 months later S/S (weakness, lethargy, fevers); ; dx'd infectious hepatitis"  . History of herpes genitalis    "stress induced reoccurance that shows up on my left middle finger" (08/11/2014)  . History of hiatal hernia   . History of stomach ulcers   . Hypercholesterolemia   . Hyperthyroidism 1994   "postpartum only; resolved itself"  . Incisional hernia   . Internal hemorrhoids   . Leg cramping   . Osteopenia   . Seasonal asthma   . Stress incontinence, female   . Ulcer    Past Surgical History:  Procedure Laterality Date  . CESAREAN SECTION  1994  . CHALAZION EXCISION Left ~ 2013  . DILATION AND CURETTAGE OF UTERUS  X 2  . EYE SURGERY    . HERNIA REPAIR  08/11/2014  . INCISIONAL HERNIA REPAIR N/A 08/11/2014   Procedure: LAPAROSCOPIC INCISIONAL HERNIA REPAIR;  Surgeon: Coralie Keens, MD;  Location: [facility];  Service: General;  Laterality: N/A;  . INCONTINENCE SURGERY  2005  . INGUINAL HERNIA REPAIR Left 08/11/2014  . INGUINAL HERNIA REPAIR Left 08/11/2014   Procedure: LEFT INGUINAL HERNIA REPAIR;  Surgeon: Coralie Keens, MD;  Location: [facility];  Service: General;  Laterality: Left;  . INSERTION OF MESH Left 08/11/2014   Procedure: INSERTION OF MESH TO LEFT GROIN AND ABDOMEN;  Surgeon: Coralie Keens, MD;  Location: [facility];  Service: General;  Laterality: Left;  . LAPAROSCOPIC  INCISIONAL / UMBILICAL / VENTRAL HERNIA REPAIR  08/11/2014   [facility]  . NASAL POLYP EXCISION  2014  . REPAIR OF PERFORATED ULCER  1985  . TONSILLECTOMY    . TOTAL SHOULDER ARTHROPLASTY Left 09/21/2017   Procedure: LEFT TOTAL SHOULDER ARTHROPLASTY;  Surgeon: Nicholes Stairs, MD;  Location: [facility];  Service: Orthopedics;  Laterality: Left;   Family History  Problem Relation Age of Onset  . Arthritis Mother   . Heart disease Mother        CABG @ 84  . Diabetes Mother   . Osteoporosis Mother   . Hyperlipidemia Mother   . Arthritis Father   . Lymphoma Father 88       chemo  . Cancer Father        lymphoma  .  Lymphoma Paternal Grandmother   . Diabetes Maternal Grandmother   . Colon cancer Neg Hx   . Rectal cancer Neg Hx   . Esophageal cancer Neg Hx    Social History   Socioeconomic History  . Marital status: Divorced    Spouse name: Not on file  . Number of children: 1  . Years of education: 66  . Highest education level: Bachelor's degree (e.g., BA, AB, BS)  Occupational History  . Occupation: Consulting civil engineer    Comment: Full time; Barista  Social Needs  . Financial resource strain: Not hard at all  . Food insecurity    Worry: Never true    Inability: Never true  . Transportation needs    Medical: No    Non-medical: No  Tobacco Use  . Smoking status: Never Smoker  . Smokeless tobacco: Never Used  Substance and Sexual Activity  . Alcohol use: Yes    Alcohol/week: 2.0 standard drinks    Types: 1 Glasses of wine, 1 Shots of liquor per week    Comment: 08/11/2014 "0-2 glasses of wine or a mixed drink q wk"  . Drug use: No  . Sexual activity: Not Currently    Birth control/protection: Post-menopausal  Lifestyle  . Physical activity    Days per week: 0 days    Minutes per session: 0 min  . Stress: Only a little  Relationships  . Social connections    Talks on phone: More than three times a week    Gets together: Never    Attends religious  service: More than 4 times per year    Active member of club or organization: No    Attends meetings of clubs or organizations: Never    Relationship status: Divorced  Other Topics Concern  . Not on file  Social History Narrative   Marital status: divorced; not dating      Children: 1 daughter; no grandchildren      Lives:        Employment: Optometrist      Tobacco:       Alcohol:      Drugs:       Exercise:     Tobacco Counseling Counseling given: Not Answered   Clinical Intake:  Pre-visit preparation completed: Yes  Pain : No/denies pain     Nutritional Risks: None Diabetes: No  How often do you need to have someone help you when you read instructions, pamphlets, or other written materials from your doctor or pharmacy?: 1 - Never What is the last grade level you completed in school?: 16  Interpreter Needed?: No  Information entered by :: Orlie Dakin, LPN   Activities of Daily Living In your present state of health, do you have any difficulty performing the following activities: 09/25/2018  Hearing? N  Vision? N  Difficulty concentrating or making decisions? Y  Walking or climbing stairs? N  Dressing or bathing? N  Doing errands, shopping? N  Preparing Food and eating ? N  Using the Toilet? N  In the past six months, have you accidently leaked urine? Y  Comment wears a pad  Do you have problems with loss of bowel control? N  Managing your Medications? N  Managing your Finances? N  Housekeeping or managing your Housekeeping? N  Some recent data might be hidden     Immunizations and Health Maintenance Immunization History  Administered Date(s) Administered  . Influenza Inj Mdck Quad With Preservative 10/24/2016  . Influenza  Split 12/01/2010  . Influenza, High Dose Seasonal PF 10/02/2017  . Influenza,inj,Quad PF,6+ Mos 11/29/2015  . Influenza-Unspecified 09/30/2012, 10/08/2013, 10/31/2014  . Pneumococcal Conjugate-13 11/29/2015  . Pneumococcal  Polysaccharide-23 05/28/2017  . Tdap 12/30/2009  . Zoster 09/30/2012  . Zoster Recombinat (Shingrix) 11/13/2017   Health Maintenance Due  Topic Date Due  . INFLUENZA VACCINE  08/31/2018    Patient Care Team: Hali Marry, MD as PCP - General (Family Medicine) Woodroe Mode, MD (Obstetrics and Gynecology) Thornell Sartorius, MD (Otolaryngology) Nicholaus Bloom, MD (Psychiatry)  Indicate any recent Medical Services you may have received from other than [facility] providers in the past year (date may be approximate).     Assessment:   This is a routine wellness examination for Jemina.Physical assessment deferred to PCP.   Hearing/Vision screen No exam data present  Dietary issues and exercise activities discussed: Current Exercise Habits: The patient does not participate in regular exercise at present, Exercise limited by: None identified Diet eats a salad every day. Breakfast: Patient states she is a compulsive eater. Has a frozen fruit with spinach and milk. Lunch:  Salad or leftovers Dinner:  Whole wheat pasta and ribs and fish     Goals    . Patient Stated     Patient states would like to start exercising and eating better      Depression Screen PHQ 2/9 Scores 09/25/2018 06/21/2018 02/05/2018 12/17/2017 08/03/2017 07/02/2017 05/28/2017  PHQ - 2 Score 0 0 0 0 0 0 0  PHQ- 9 Score - - 5 4 2  - -    Fall Risk Fall Risk  09/25/2018 07/02/2017 05/28/2017 01/11/2017 12/09/2016  Falls in the past year? 1 No No No No  Number falls in past yr: 0 - - - -  Injury with Fall? 0 - - - -  Risk for fall due to : Impaired balance/gait - - - -  Follow up Falls prevention discussed - - - -    Is the patient's home free of loose throw rugs in walkways, pet beds, electrical cords, etc?   yes      Grab bars in the bathroom? yes      Handrails on the stairs?   yes      Adequate lighting?   yes   Cognitive Function:     6CIT Screen 09/25/2018  What Year? 0 points  What month? 0 points   What time? 0 points  Count back from 20 0 points  Months in reverse 0 points  Repeat phrase 0 points  Total Score 0    Screening Tests Health Maintenance  Topic Date Due  . INFLUENZA VACCINE  08/31/2018  . MAMMOGRAM  08/15/2019  . TETANUS/TDAP  12/31/2019  . COLONOSCOPY  01/21/2021  . DEXA SCAN  Completed  . Hepatitis C Screening  Completed  . PNA vac Low Risk Adult  Completed      Plan:      Ms. Broome , Thank you for taking time to come for your Medicare Wellness Visit. I appreciate your ongoing commitment to your health goals. Please review the following plan we discussed and let me know if I can assist you in the future.  Please schedule your next medicare wellness visit with me in 1 yr. Bring a copy of your living will and/or healthcare power of attorney to your next office visit. Continue doing brain stimulating activities (puzzles, reading, adult coloring books, staying active) to keep memory sharp.    These  are the goals we discussed: Goals    . Patient Stated     Patient states would like to start exercising and eating better       This is a list of the screening recommended for you and due dates:  Health Maintenance  Topic Date Due  . Flu Shot  08/31/2018  . Mammogram  08/15/2019  . Tetanus Vaccine  12/31/2019  . Colon Cancer Screening  01/21/2021  . DEXA scan (bone density measurement)  Completed  .  Hepatitis C: One time screening is recommended by Center for Disease Control  (CDC) for  adults born from [birth year] through 1965.   Completed  . Pneumonia vaccines  Completed     I have personally reviewed and noted the following in the patient's chart:   . Medical and social history . Use of alcohol, tobacco or illicit drugs  . Current medications and supplements . Functional ability and status . Nutritional status . Physical activity . Advanced directives . List of other physicians . Hospitalizations, surgeries, and ER visits in previous 12  months . Vitals . Screenings to include cognitive, depression, and falls . Referrals and appointments  In addition, I have reviewed and discussed with patient certain preventive protocols, quality metrics, and best practice recommendations. A written personalized care plan for preventive services as well as general preventive health recommendations were provided to patient.     Joanne Chars, LPN   [id number]/[id number]

## 2018-09-25 ENCOUNTER — Ambulatory Visit (INDEPENDENT_AMBULATORY_CARE_PROVIDER_SITE_OTHER): Payer: Medicare HMO | Admitting: *Deleted

## 2018-09-25 ENCOUNTER — Telehealth: Payer: Self-pay | Admitting: *Deleted

## 2018-09-25 ENCOUNTER — Other Ambulatory Visit: Payer: Self-pay

## 2018-09-25 ENCOUNTER — Other Ambulatory Visit: Payer: Self-pay | Admitting: Family Medicine

## 2018-09-25 VITALS — BP 118/69 | HR 88 | Wt 193.0 lb

## 2018-09-25 DIAGNOSIS — I10 Essential (primary) hypertension: Secondary | ICD-10-CM | POA: Diagnosis not present

## 2018-09-25 DIAGNOSIS — E1159 Type 2 diabetes mellitus with other circulatory complications: Secondary | ICD-10-CM | POA: Diagnosis not present

## 2018-09-25 DIAGNOSIS — I152 Hypertension secondary to endocrine disorders: Secondary | ICD-10-CM

## 2018-09-25 DIAGNOSIS — Z0000 Encounter for general adult medical examination without abnormal findings: Secondary | ICD-10-CM | POA: Diagnosis not present

## 2018-09-25 MED ORDER — AMPHETAMINE-DEXTROAMPHETAMINE 20 MG PO TABS: 20.0000 mg | ORAL_TABLET | Freq: Two times a day (BID) | ORAL | 0 refills | Status: DC | PRN

## 2018-09-25 MED ORDER — CYCLOBENZAPRINE HCL 10 MG PO TABS: 10.0000 mg | ORAL_TABLET | Freq: Every evening | ORAL | 0 refills | Status: DC | PRN

## 2018-09-25 NOTE — Patient Instructions (Signed)
[name] , Thank you for taking time to come for your Medicare Wellness Visit. I appreciate your ongoing commitment to your health goals. Please review the following plan we discussed and let me know if I can assist you in the future.  Please schedule your next medicare wellness visit with me in 1 yr. Bring a copy of your living will and/or healthcare power of attorney to your next office visit. Continue doing brain stimulating activities (puzzles, reading, adult coloring books, staying active) to keep memory sharp.  These are the goals we discussed: Goals    . Patient Stated     Patient states would like to start exercising and eating better

## 2018-09-25 NOTE — Addendum Note (Signed)
Addended by: Teddy Spike on: 09/25/2018 12:25 PM   Modules accepted: Orders

## 2018-09-26 NOTE — Progress Notes (Signed)
All labs are normal. 

## 2018-09-27 ENCOUNTER — Encounter: Payer: Self-pay | Admitting: Family Medicine

## 2018-09-27 ENCOUNTER — Other Ambulatory Visit: Payer: Self-pay | Admitting: *Deleted

## 2018-09-27 LAB — BASIC METABOLIC PANEL
BUN: 17 mg/dL (ref 7–25)
CO2: 27 mmol/L (ref 20–32)
Calcium: 9.7 mg/dL (ref 8.6–10.4)
Chloride: 104 mmol/L (ref 98–110)
Creat: 0.85 mg/dL (ref 0.50–0.99)
Glucose, Bld: 101 mg/dL — ABNORMAL HIGH (ref 65–99)
Potassium: 5.1 mmol/L (ref 3.5–5.3)
Sodium: 142 mmol/L (ref 135–146)

## 2018-09-27 LAB — LIPID PANEL
Cholesterol: 209 mg/dL — ABNORMAL HIGH (ref <200)
HDL: 61 mg/dL (ref >=50)
LDL Cholesterol (Calc): 123 mg/dL (calc) — ABNORMAL HIGH
Non-HDL Cholesterol (Calc): 148 mg/dL (calc) — ABNORMAL HIGH (ref <130)
Total CHOL/HDL Ratio: 3.4 (calc) (ref <5.0)
Triglycerides: 137 mg/dL (ref <150)

## 2018-09-27 LAB — TSH: TSH: 1.73 mIU/L (ref 0.40–4.50)

## 2018-09-27 MED ORDER — AMPHETAMINE-DEXTROAMPHETAMINE 20 MG PO TABS: 20.0000 mg | ORAL_TABLET | Freq: Two times a day (BID) | ORAL | 0 refills | Status: DC | PRN

## 2018-09-27 NOTE — Telephone Encounter (Signed)
Error

## 2018-10-03 ENCOUNTER — Ambulatory Visit (INDEPENDENT_AMBULATORY_CARE_PROVIDER_SITE_OTHER): Payer: Medicare HMO | Admitting: Family Medicine

## 2018-10-03 ENCOUNTER — Other Ambulatory Visit: Payer: Self-pay

## 2018-10-03 ENCOUNTER — Encounter: Payer: Self-pay | Admitting: Family Medicine

## 2018-10-03 VITALS — BP 123/71 | HR 94 | Temp 99.2°F | Ht 61.0 in | Wt 191.2 lb

## 2018-10-03 DIAGNOSIS — N3281 Overactive bladder: Secondary | ICD-10-CM

## 2018-10-03 DIAGNOSIS — Z23 Encounter for immunization: Secondary | ICD-10-CM

## 2018-10-03 DIAGNOSIS — L299 Pruritus, unspecified: Secondary | ICD-10-CM

## 2018-10-03 DIAGNOSIS — J3089 Other allergic rhinitis: Secondary | ICD-10-CM | POA: Insufficient documentation

## 2018-10-03 MED ORDER — AMBULATORY NON FORMULARY MEDICATION: 0 refills | Status: DC

## 2018-10-03 MED ORDER — TOVIAZ 8 MG PO TB24: 8.0000 mg | ORAL_TABLET | Freq: Every day | ORAL | 3 refills | Status: DC

## 2018-10-03 NOTE — Assessment & Plan Note (Signed)
She doesn't like the salt combo.  She would like to consider generic Ritalin when she is do for a refill.

## 2018-10-03 NOTE — Progress Notes (Signed)
Subjective:     [name] is a 68 y.o. female and is here for a comprehensive physical exam. The patient reports problems - ear itching. She has had sneezing and ear itching she takes her Claritin once a day.   She has started exercising doing Yoga.  Not really getting out to walk for exercise. Has been hard staying in for the last 6 months. .    She got generic adder all salts form her mail order and feels like it is not working well.  Feels likes she is not sleeping well. Had HA when she first started it.  Just doesn't feel like it is not effective. She check with her formulary and is interested in changing to Generic Ritalin when she completes her 90 day supply.    She is trying to get her Lisbeth Ply covered through [employer].  She has been out of the medicine bc she can't afford it and and has been waking up every 2 hours to urinate.      Social History   Socioeconomic History  . Marital status: Divorced    Spouse name: Not on file  . Number of children: 1  . Years of education: 44  . Highest education level: Bachelor's degree (e.g., BA, AB, BS)  Occupational History  . Occupation: Consulting civil engineer    Comment: Full time; Barista  Social Needs  . Financial resource strain: Not hard at all  . Food insecurity    Worry: Never true    Inability: Never true  . Transportation needs    Medical: No    Non-medical: No  Tobacco Use  . Smoking status: Never Smoker  . Smokeless tobacco: Never Used  Substance and Sexual Activity  . Alcohol use: Yes    Alcohol/week: 2.0 standard drinks    Types: 1 Glasses of wine, 1 Shots of liquor per week    Comment: 08/11/2014 "0-2 glasses of wine or a mixed drink q wk"  . Drug use: No  . Sexual activity: Not Currently    Birth control/protection: Post-menopausal  Lifestyle  . Physical activity    Days per week: 0 days    Minutes per session: 0 min  . Stress: Only a little  Relationships  . Social  connections    Talks on phone: More than three times a week    Gets together: Never    Attends religious service: More than 4 times per year    Active member of club or organization: No    Attends meetings of clubs or organizations: Never    Relationship status: Divorced  . Intimate partner violence    Fear of current or ex partner: No    Emotionally abused: No    Physically abused: No    Forced sexual activity: No  Other Topics Concern  . Not on file  Social History Narrative   Marital status: divorced; not dating      Children: 1 daughter; no grandchildren      Lives:        Employment: Optometrist      Tobacco:       Alcohol:      Drugs:       Exercise:    Health Maintenance  Topic Date Due  . URINE MICROALBUMIN  09/30/1960  . INFLUENZA VACCINE  08/31/2018  . MAMMOGRAM  08/15/2019  . TETANUS/TDAP  12/31/2019  . COLONOSCOPY  01/21/2021  . DEXA SCAN  Completed  . Hepatitis  C Screening  Completed  . PNA vac Low Risk Adult  Completed    The following portions of the patient's history were reviewed and updated as appropriate: allergies, current medications, past family history, past medical history, past social history, past surgical history and problem list.  Review of Systems A comprehensive review of systems was negative.   Objective:    BP 123/71   Pulse 94   Temp 99.2 F (37.3 C)   Ht 5\' 1"  (1.549 m)   Wt 191 lb 3.2 oz (86.7 kg)   LMP 08/30/2008   SpO2 97%   BMI 36.13 kg/m  General appearance: alert, cooperative and appears stated age Head: Normocephalic, without obvious abnormality, atraumatic Eyes: conj clear, EOMI, PEERLA Ears: normal TM's and external ear canals both ears Nose: Nares normal. Septum midline. Mucosa normal. No drainage or sinus tenderness. Throat: lips, mucosa, and tongue normal; teeth and gums normal Neck: no adenopathy, no carotid bruit, no JVD, supple, symmetrical, trachea midline and thyroid not enlarged, symmetric, no  tenderness/mass/nodules Back: symmetric, no curvature. ROM normal. No CVA tenderness. Lungs: clear to auscultation bilaterally Breasts: normal appearance, no masses or tenderness Heart: regular rate and rhythm, S1, S2 normal, no murmur, click, rub or gallop Abdomen: soft, non-tender; bowel sounds normal; no masses,  no organomegaly Extremities: extremities normal, atraumatic, no cyanosis or edema Pulses: 2+ and symmetric Skin: Skin color, texture, turgor normal. No rashes or lesions Lymph nodes: Cervical, supraclavicular, and axillary nodes normal. Neurologic: Alert and oriented X 3, normal strength and tone. Normal symmetric reflexes. Normal coordination and gait    Assessment:    Healthy female exam.      Plan:     See After Visit Summary for Counseling Recommendations    Keep up a regular exercise program and make sure you are eating a healthy diet Try to eat 4 servings of dairy a day, or if you are lactose intolerant take a calcium with vitamin D daily.  Your vaccines are up to date. Flu vaccine given.   Needs to get her second shingles vaccine.  Given rx to take to the pharmacy.   ADD (attention deficit disorder) She doesn't like the salt combo.  She would like to consider generic Ritalin when she is do for a refill.    OAB (overactive bladder) Completed forms for drug assistance.   Non-seasonal allergic rhinitis Add Zyrtec at bedtime for a few days and see if helps w/ ear itching and sneezing.  Also discussed using nasal steroid spray.

## 2018-10-03 NOTE — Assessment & Plan Note (Signed)
Add Zyrtec at bedtime for a few days and see if helps w/ ear itching and sneezing.  Also discussed using nasal steroid spray.

## 2018-10-03 NOTE — Patient Instructions (Signed)
Preventive Care 68 Years and Older, Female Preventive care refers to lifestyle choices and visits with your health care provider that can promote health and wellness. This includes:  A yearly physical exam. This is also called an annual well check.  Regular dental and eye exams.  Immunizations.  Screening for certain conditions.  Healthy lifestyle choices, such as diet and exercise. What can I expect for my preventive care visit? Physical exam Your health care provider will check:  Height and weight. These may be used to calculate body mass index (BMI), which is a measurement that tells if you are at a healthy weight.  Heart rate and blood pressure.  Your skin for abnormal spots. Counseling Your health care provider may ask you questions about:  Alcohol, tobacco, and drug use.  Emotional well-being.  Home and relationship well-being.  Sexual activity.  Eating habits.  History of falls.  Memory and ability to understand (cognition).  Work and work Statistician.  Pregnancy and menstrual history. What immunizations do I need?  Influenza (flu) vaccine  This is recommended every year. Tetanus, diphtheria, and pertussis (Tdap) vaccine  You may need a Td booster every 10 years. Varicella (chickenpox) vaccine  You may need this vaccine if you have not already been vaccinated. Zoster (shingles) vaccine  You may need this after age 33. Pneumococcal conjugate (PCV13) vaccine  One dose is recommended after age 33. Pneumococcal polysaccharide (PPSV23) vaccine  One dose is recommended after age 72. Measles, mumps, and rubella (MMR) vaccine  You may need at least one dose of MMR if you were born in 1957 or later. You may also need a second dose. Meningococcal conjugate (MenACWY) vaccine  You may need this if you have certain conditions. Hepatitis A vaccine  You may need this if you have certain conditions or if you travel or work in places where you may be exposed  to hepatitis A. Hepatitis B vaccine  You may need this if you have certain conditions or if you travel or work in places where you may be exposed to hepatitis B. Haemophilus influenzae type b (Hib) vaccine  You may need this if you have certain conditions. You may receive vaccines as individual doses or as more than one vaccine together in one shot (combination vaccines). Talk with your health care provider about the risks and benefits of combination vaccines. What tests do I need? Blood tests  Lipid and cholesterol levels. These may be checked every 5 years, or more frequently depending on your overall health.  Hepatitis C test.  Hepatitis B test. Screening  Lung cancer screening. You may have this screening every year starting at age 39 if you have a 30-pack-year history of smoking and currently smoke or have quit within the past 15 years.  Colorectal cancer screening. All adults should have this screening starting at age 36 and continuing until age 15. Your health care provider may recommend screening at age 23 if you are at increased risk. You will have tests every 1-10 years, depending on your results and the type of screening test.  Diabetes screening. This is done by checking your blood sugar (glucose) after you have not eaten for a while (fasting). You may have this done every 1-3 years.  Mammogram. This may be done every 1-2 years. Talk with your health care provider about how often you should have regular mammograms.  BRCA-related cancer screening. This may be done if you have a family history of breast, ovarian, tubal, or peritoneal cancers.  Other tests  Sexually transmitted disease (STD) testing.  Bone density scan. This is done to screen for osteoporosis. You may have this done starting at age 76. Follow these instructions at home: Eating and drinking  Eat a diet that includes fresh fruits and vegetables, whole grains, lean protein, and low-fat dairy products. Limit  your intake of foods with high amounts of sugar, saturated fats, and salt.  Take vitamin and mineral supplements as recommended by your health care provider.  Do not drink alcohol if your health care provider tells you not to drink.  If you drink alcohol: ? Limit how much you have to 0-1 drink a day. ? Be aware of how much alcohol is in your drink. In the [place] one drink equals one 12 oz bottle of beer (355 mL), one 5 oz glass of wine (148 mL), or one 1 oz glass of hard liquor (44 mL). Lifestyle  Take daily care of your teeth and gums.  Stay active. Exercise for at least 30 minutes on 5 or more days each week.  Do not use any products that contain nicotine or tobacco, such as cigarettes, e-cigarettes, and chewing tobacco. If you need help quitting, ask your health care provider.  If you are sexually active, practice safe sex. Use a condom or other form of protection in order to prevent STIs (sexually transmitted infections).  Talk with your health care provider about taking a low-dose aspirin or statin. What's next?  Go to your health care provider once a year for a well check visit.  Ask your health care provider how often you should have your eyes and teeth checked.  Stay up to date on all vaccines. This information is not intended to replace advice given to you by your health care provider. Make sure you discuss any questions you have with your health care provider. Document Released: 02/12/2015 Document Revised: 01/10/2018 Document Reviewed: 01/10/2018 Elsevier Patient Education  2020 [employer].

## 2018-10-03 NOTE — Assessment & Plan Note (Signed)
Completed forms for drug assistance.

## 2018-10-04 DIAGNOSIS — Z23 Encounter for immunization: Secondary | ICD-10-CM

## 2018-10-04 DIAGNOSIS — J3089 Other allergic rhinitis: Secondary | ICD-10-CM | POA: Diagnosis not present

## 2018-10-04 DIAGNOSIS — N3281 Overactive bladder: Secondary | ICD-10-CM | POA: Diagnosis not present

## 2018-10-04 DIAGNOSIS — L299 Pruritus, unspecified: Secondary | ICD-10-CM | POA: Diagnosis not present

## 2018-10-09 ENCOUNTER — Encounter: Payer: Self-pay | Admitting: Family Medicine

## 2018-10-09 DIAGNOSIS — Z1231 Encounter for screening mammogram for malignant neoplasm of breast: Secondary | ICD-10-CM | POA: Diagnosis not present

## 2018-10-30 DIAGNOSIS — M545 Low back pain: Secondary | ICD-10-CM | POA: Diagnosis not present

## 2018-11-28 DIAGNOSIS — M51369 Other intervertebral disc degeneration, lumbar region without mention of lumbar back pain or lower extremity pain: Secondary | ICD-10-CM | POA: Insufficient documentation

## 2018-11-28 DIAGNOSIS — M5136 Other intervertebral disc degeneration, lumbar region: Secondary | ICD-10-CM | POA: Diagnosis not present

## 2018-12-16 ENCOUNTER — Telehealth: Payer: Self-pay | Admitting: Family Medicine

## 2018-12-16 NOTE — Telephone Encounter (Signed)
Patient called and she is having some sinus pressure and has been taking some Zrytec in the evening and Claritin in the morning which helps some. She is going to call back if she has more symptoms and make a virtual with PCP.

## 2018-12-24 DIAGNOSIS — M5136 Other intervertebral disc degeneration, lumbar region: Secondary | ICD-10-CM | POA: Diagnosis not present

## 2019-01-23 DIAGNOSIS — M5416 Radiculopathy, lumbar region: Secondary | ICD-10-CM | POA: Diagnosis not present

## 2019-01-27 ENCOUNTER — Other Ambulatory Visit: Payer: Self-pay | Admitting: *Deleted

## 2019-01-27 MED ORDER — CYCLOBENZAPRINE HCL 10 MG PO TABS: 10.0000 mg | ORAL_TABLET | Freq: Every evening | ORAL | 0 refills | Status: DC | PRN

## 2019-02-24 ENCOUNTER — Encounter: Payer: Self-pay | Admitting: Family Medicine

## 2019-02-24 ENCOUNTER — Other Ambulatory Visit: Payer: Self-pay | Admitting: *Deleted

## 2019-02-24 DIAGNOSIS — N3281 Overactive bladder: Secondary | ICD-10-CM

## 2019-02-24 MED ORDER — DULOXETINE HCL 30 MG PO CPEP: ORAL_CAPSULE | ORAL | 1 refills | Status: DC

## 2019-02-24 MED ORDER — TOVIAZ 8 MG PO TB24: 8.0000 mg | ORAL_TABLET | Freq: Every day | ORAL | 3 refills | Status: DC

## 2019-04-08 ENCOUNTER — Other Ambulatory Visit: Payer: Self-pay

## 2019-04-08 MED ORDER — BUDESONIDE-FORMOTEROL FUMARATE 160-4.5 MCG/ACT IN AERO: 2.0000 | INHALATION_SPRAY | Freq: Two times a day (BID) | RESPIRATORY_TRACT | 0 refills | Status: DC

## 2019-04-11 NOTE — Telephone Encounter (Signed)
No action needed

## 2019-04-14 DIAGNOSIS — M25562 Pain in left knee: Secondary | ICD-10-CM | POA: Diagnosis not present

## 2019-04-14 DIAGNOSIS — M25561 Pain in right knee: Secondary | ICD-10-CM | POA: Diagnosis not present

## 2019-04-14 DIAGNOSIS — M170 Bilateral primary osteoarthritis of knee: Secondary | ICD-10-CM | POA: Diagnosis not present

## 2019-04-17 ENCOUNTER — Encounter: Payer: Self-pay | Admitting: Family Medicine

## 2019-04-17 ENCOUNTER — Other Ambulatory Visit: Payer: Self-pay

## 2019-04-17 ENCOUNTER — Ambulatory Visit (INDEPENDENT_AMBULATORY_CARE_PROVIDER_SITE_OTHER): Payer: Medicare HMO | Admitting: Family Medicine

## 2019-04-17 VITALS — BP 118/65 | HR 94 | Ht 61.0 in | Wt 191.0 lb

## 2019-04-17 DIAGNOSIS — R195 Other fecal abnormalities: Secondary | ICD-10-CM | POA: Diagnosis not present

## 2019-04-17 DIAGNOSIS — R1013 Epigastric pain: Secondary | ICD-10-CM

## 2019-04-17 MED ORDER — OMEPRAZOLE 40 MG PO CPDR: 40.0000 mg | DELAYED_RELEASE_CAPSULE | Freq: Every day | ORAL | 3 refills | Status: DC

## 2019-04-17 NOTE — Progress Notes (Signed)
Established Patient Office Visit  Subjective:  Patient ID: [name], female    DOB: 08/19/1950  Age: 69 y.o. MRN: [id number]  CC:  Chief Complaint  Patient presents with  . Abdominal Pain    HPI [name] presents for bloating and dull pain in her epigastric area.  She has a hx of hiatal hernia.  She occ hears a gurgling noise but that is not new.  She says her has regular bowel movement but they are pebble like. But she doesn't strain or have pain with BM  She eats fruit and veggies daily.    She has been belching more.   She has had some weight gain with Covid.  She does still drink a little bit of caffeine but not a lot.  She does eat citrus fruits and tomatoes.  Past Medical History:  Diagnosis Date  . ADD (attention deficit disorder)   . Arthritis    "bunion on  right" (08/11/2014)  . Chronic bronchitis ([place])    "plenty; but not q yr" (08/11/2014)  . Depression   . Dyslipidemia   . Environmental allergies   . GERD (gastroesophageal reflux disease)   . Hearing difficulty of left ear    "grew up w/deaf parent; find myself lip reading more recently" (08/11/2014)  . Hepatitis    "exposed in college; rec'd gamma globulin; 6 months later S/S (weakness, lethargy, fevers); ; dx'd infectious hepatitis"  . History of herpes genitalis    "stress induced reoccurance that shows up on my left middle finger" (08/11/2014)  . History of hiatal hernia   . History of stomach ulcers   . Hypercholesterolemia   . Hyperthyroidism 1994   "postpartum only; resolved itself"  . Incisional hernia   . Internal hemorrhoids   . Leg cramping   . Osteopenia   . Seasonal asthma   . Stress incontinence, female   . Ulcer     Past Surgical History:  Procedure Laterality Date  . CESAREAN SECTION  1994  . CHALAZION EXCISION Left ~ 2013  . DILATION AND CURETTAGE OF UTERUS  X 2  . EYE SURGERY    . HERNIA REPAIR  08/11/2014  . INCISIONAL HERNIA REPAIR N/A 08/11/2014   Procedure: LAPAROSCOPIC  INCISIONAL HERNIA REPAIR;  Surgeon: Coralie Keens, MD;  Location: [facility];  Service: General;  Laterality: N/A;  . INCONTINENCE SURGERY  2005  . INGUINAL HERNIA REPAIR Left 08/11/2014  . INGUINAL HERNIA REPAIR Left 08/11/2014   Procedure: LEFT INGUINAL HERNIA REPAIR;  Surgeon: Coralie Keens, MD;  Location: [facility];  Service: General;  Laterality: Left;  . INSERTION OF MESH Left 08/11/2014   Procedure: INSERTION OF MESH TO LEFT GROIN AND ABDOMEN;  Surgeon: Coralie Keens, MD;  Location: [facility];  Service: General;  Laterality: Left;  . LAPAROSCOPIC INCISIONAL / UMBILICAL / VENTRAL HERNIA REPAIR  08/11/2014   [facility]  . NASAL POLYP EXCISION  2014  . REPAIR OF PERFORATED ULCER  1985  . TONSILLECTOMY    . TOTAL SHOULDER ARTHROPLASTY Left 09/21/2017   Procedure: LEFT TOTAL SHOULDER ARTHROPLASTY;  Surgeon: Nicholes Stairs, MD;  Location: [facility];  Service: Orthopedics;  Laterality: Left;    Family History  Problem Relation Age of Onset  . Arthritis Mother   . Heart disease Mother        CABG @ 34  . Diabetes Mother   . Osteoporosis Mother   . Hyperlipidemia Mother   . Arthritis Father   . Lymphoma Father  81       chemo  . Cancer Father        lymphoma  . Lymphoma Paternal Grandmother   . Diabetes Maternal Grandmother   . Colon cancer Neg Hx   . Rectal cancer Neg Hx   . Esophageal cancer Neg Hx     Social History   Socioeconomic History  . Marital status: Divorced    Spouse name: Not on file  . Number of children: 1  . Years of education: 77  . Highest education level: Bachelor's degree (e.g., BA, AB, BS)  Occupational History  . Occupation: Consulting civil engineer    Comment: Full time; Barista  Tobacco Use  . Smoking status: Never Smoker  . Smokeless tobacco: Never Used  Substance and Sexual Activity  . Alcohol use: Yes    Alcohol/week: 2.0 standard drinks    Types: 1 Glasses of wine, 1 Shots of liquor per week    Comment: 08/11/2014 "0-2 glasses of wine  or a mixed drink q wk"  . Drug use: No  . Sexual activity: Not Currently    Birth control/protection: Post-menopausal  Other Topics Concern  . Not on file  Social History Narrative   Marital status: divorced; not dating      Children: 1 daughter; no grandchildren      Lives:        Employment: Optometrist      Tobacco:       Alcohol:      Drugs:       Exercise:    Social Determinants of Radio broadcast assistant Strain: Low Risk   . Difficulty of Paying Living Expenses: Not hard at all  Food Insecurity: No Food Insecurity  . Worried About Charity fundraiser in the Last Year: Never true  . Ran Out of Food in the Last Year: Never true  Transportation Needs: No Transportation Needs  . Lack of Transportation (Medical): No  . Lack of Transportation (Non-Medical): No  Physical Activity: Inactive  . Days of Exercise per Week: 0 days  . Minutes of Exercise per Session: 0 min  Stress: No Stress Concern Present  . Feeling of Stress : Only a little  Social Connections: Somewhat Isolated  . Frequency of Communication with Friends and Family: More than three times a week  . Frequency of Social Gatherings with Friends and Family: Never  . Attends Religious Services: More than 4 times per year  . Active Member of Clubs or Organizations: No  . Attends Archivist Meetings: Never  . Marital Status: Divorced  Human resources officer Violence: Not At Risk  . Fear of Current or Ex-Partner: No  . Emotionally Abused: No  . Physically Abused: No  . Sexually Abused: No    Outpatient Medications Prior to Visit  Medication Sig Dispense Refill  . acetaminophen (TYLENOL) 650 MG CR tablet Take 1,300 mg by mouth See admin instructions. Take 1,300 mg by mouth at bedtime and may also take an additional 1,300 mg one to two times a day as needed for pain    . albuterol (PROAIR HFA) 108 (90 Base) MCG/ACT inhaler Inhale 2 puffs into the lungs every 6 (six) hours as needed for wheezing or  shortness of breath. 3 Inhaler 0  . amphetamine-dextroamphetamine (ADDERALL) 20 MG tablet Take 1 tablet (20 mg total) by mouth 2 (two) times daily as needed. 180 tablet 0  . Biotin 5000 MCG CAPS Take 1 tablet by mouth daily.     Marland Kitchen  budesonide-formoterol (SYMBICORT) 160-4.5 MCG/ACT inhaler Inhale 2 puffs into the lungs 2 (two) times daily. 1 Inhaler 0  . Calcium Carb-Cholecalciferol (CALCIUM+D3 PO) Take 2 tablets by mouth daily.     . clindamycin (CLEOCIN-T) 1 % external solution Apply topically 2 (two) times daily. To affected area/bumps 30 mL 0  . cyclobenzaprine (FLEXERIL) 10 MG tablet Take 1 tablet (10 mg total) by mouth at bedtime as needed (for sleep or pain). 30 tablet 0  . DULoxetine (CYMBALTA) 30 MG capsule 2 caps orally in AM and 1 in PM 270 capsule 1  . fesoterodine (TOVIAZ) 8 MG TB24 tablet Take 1 tablet (8 mg total) by mouth daily. 90 tablet 3  . Glucos-Chond-Hyal Ac-Ca Fructo (MOVE FREE JOINT HEALTH ADVANCE) TABS Take 2 tablets by mouth daily. Move free joint health Advanced formula with MSM/Vit D3 glucosamine and Chondroitin    . ipratropium (ATROVENT) 0.03 % nasal spray Place 2 sprays into the nose 4 (four) times daily. Use as needed for runny nose and congestion 30 mL 1  . loratadine (CLARITIN) 10 MG tablet Take 1 tablet (10 mg total) by mouth daily. 90 tablet 3  . Multiple Vitamins-Minerals (ONE-A-DAY WOMENS 50 PLUS) TABS Take 1 tablet by mouth daily.    . NON FORMULARY Take 1 tablet by mouth daily. Viviscal    . simvastatin (ZOCOR) 40 MG tablet Take 1 tablet (40 mg total) by mouth at bedtime. 90 tablet 4  . Turmeric Curcumin 500 MG CAPS Take 1 capsule by mouth daily.    . AMBULATORY NON FORMULARY MEDICATION Medication Name: Shingrix IM x 1 1 vial 0  . Glucosamine-Chondroitin (GLUCOSAMINE CHONDR COMPLEX PO) Take 1,500 mg by mouth daily.    . mometasone (NASONEX) 50 MCG/ACT nasal spray INHALE 2 SPRAYS INTO THE NOSE DAILY AS NEEDED (Patient taking differently: Place 2 sprays into the  nose daily as needed (for inflammation). ) 17 g 5   No facility-administered medications prior to visit.    Allergies  Allergen Reactions  . Aspirin Other (See Comments)    GI Intolerance; History of ulcers; GI BLEED  . Bee Venom Swelling    Any insects that bite or stings- swelling at site of sting or cellulitis can develop  . Nsaids Other (See Comments)    History of ulcers  . Vesicare [Solifenacin] Itching    ROS Review of Systems    Objective:    Physical Exam  BP 118/65   Pulse 94   Ht 5\' 1"  (1.549 m)   Wt 191 lb (86.6 kg)   LMP 08/30/2008   SpO2 100%   BMI 36.09 kg/m  Wt Readings from Last 3 Encounters:  04/17/19 191 lb (86.6 kg)  10/03/18 191 lb 3.2 oz (86.7 kg)  09/25/18 193 lb (87.5 kg)     Health Maintenance Due  Topic Date Due  . URINE MICROALBUMIN  Never done    There are no preventive care reminders to display for this patient.  Lab Results  Component Value Date   TSH 1.73 09/25/2018   Lab Results  Component Value Date   WBC 7.4 11/13/2017   HGB 13.7 11/13/2017   HCT 41.7 11/13/2017   MCV 83.4 11/13/2017   PLT 418 (H) 11/13/2017   Lab Results  Component Value Date   NA 142 09/25/2018   K 5.1 09/25/2018   CO2 27 09/25/2018   GLUCOSE 101 (H) 09/25/2018   BUN 17 09/25/2018   CREATININE 0.85 09/25/2018   BILITOT 0.3 07/18/2018  ALKPHOS 104 07/02/2017   AST 20 07/18/2018   ALT 20 07/18/2018   PROT 6.9 07/18/2018   ALBUMIN 4.3 07/02/2017   CALCIUM 9.7 09/25/2018   ANIONGAP 7 09/22/2017   GFR 74.97 03/19/2013   Lab Results  Component Value Date   CHOL 209 (H) 09/25/2018   Lab Results  Component Value Date   HDL 61 09/25/2018   Lab Results  Component Value Date   LDLCALC 123 (H) 09/25/2018   Lab Results  Component Value Date   TRIG 137 09/25/2018   Lab Results  Component Value Date   CHOLHDL 3.4 09/25/2018   Lab Results  Component Value Date   HGBA1C 6.1 (H) 06/19/2018      Assessment & Plan:   Problem List  Items Addressed This Visit    None    Visit Diagnoses    Epigastric pain    -  Primary   Change in stool       Dyspepsia         Dyspepsia-sounds like it could be GERD versus gastritis.  Discussed eating a reflux diet and avoiding greasy, spicy, acidic foods avoiding caffeine.  She does have a hiatal hernia which could certainly aggravate her symptoms.  Also work on keeping upright in posture after eating.  Weight loss would also help.  Discussed getting on a PPI over the next couple weeks to see if this is helpful in controlling symptoms.  Epigastric pain-suspect related to the dyspepsia.  It does not sound consistent with pancreatitis or gallbladder disease at this point.  But we will continue to monitor for new or worsening symptoms.  Change in stools-even though she is having normal regular bowel movements they are more pebble-like.  Did encourage her to get on a softener.  This may help move things through a little bit more easily and also reduce some of the gas and bloating that she has been experiencing.  Meds ordered this encounter  Medications  . omeprazole (PRILOSEC) 40 MG capsule    Sig: Take 1 capsule (40 mg total) by mouth daily.    Dispense:  30 capsule    Refill:  3    Follow-up: No follow-ups on file.    Beatrice Lecher, MD

## 2019-04-17 NOTE — Patient Instructions (Signed)
Food Choices for Gastroesophageal Reflux Disease, Adult When you have gastroesophageal reflux disease (GERD), the foods you eat and your eating habits are very important. Choosing the right foods can help ease the discomfort of GERD. Consider working with a diet and nutrition specialist (dietitian) to help you make healthy food choices. What general guidelines should I follow?  Eating plan  Choose healthy foods low in fat, such as fruits, vegetables, whole grains, low-fat dairy products, and lean meat, fish, and poultry.  Eat frequent, small meals instead of three large meals each day. Eat your meals slowly, in a relaxed setting. Avoid bending over or lying down until 2-3 hours after eating.  Limit high-fat foods such as fatty meats or fried foods.  Limit your intake of oils, butter, and shortening to less than 8 teaspoons each day.  Avoid the following: ? Foods that cause symptoms. These may be different for different people. Keep a food diary to keep track of foods that cause symptoms. ? Alcohol. ? Drinking large amounts of liquid with meals. ? Eating meals during the 2-3 hours before bed.  Cook foods using methods other than frying. This may include baking, grilling, or broiling. Lifestyle  Maintain a healthy weight. Ask your health care provider what weight is healthy for you. If you need to lose weight, work with your health care provider to do so safely.  Exercise for at least 30 minutes on 5 or more days each week, or as told by your health care provider.  Avoid wearing clothes that fit tightly around your waist and chest.  Do not use any products that contain nicotine or tobacco, such as cigarettes and e-cigarettes. If you need help quitting, ask your health care provider.  Sleep with the head of your bed raised. Use a wedge under the mattress or blocks under the bed frame to raise the head of the bed. What foods are not recommended? The items listed may not be a complete  list. Talk with your dietitian about what dietary choices are best for you. Grains Pastries or quick breads with added fat. French toast. Vegetables Deep fried vegetables. French fries. Any vegetables prepared with added fat. Any vegetables that cause symptoms. For some people this may include tomatoes and tomato products, chili peppers, onions and garlic, and horseradish. Fruits Any fruits prepared with added fat. Any fruits that cause symptoms. For some people this may include citrus fruits, such as oranges, grapefruit, pineapple, and lemons. Meats and other protein foods High-fat meats, such as fatty beef or pork, hot dogs, ribs, ham, sausage, salami and bacon. Fried meat or protein, including fried fish and fried chicken. Nuts and nut butters. Dairy Whole milk and chocolate milk. Sour cream. Cream. Ice cream. Cream cheese. Milk shakes. Beverages Coffee and tea, with or without caffeine. Carbonated beverages. Sodas. Energy drinks. Fruit juice made with acidic fruits (such as orange or grapefruit). Tomato juice. Alcoholic drinks. Fats and oils Butter. Margarine. Shortening. Ghee. Sweets and desserts Chocolate and cocoa. Donuts. Seasoning and other foods Pepper. Peppermint and spearmint. Any condiments, herbs, or seasonings that cause symptoms. For some people, this may include curry, hot sauce, or vinegar-based salad dressings. Summary  When you have gastroesophageal reflux disease (GERD), food and lifestyle choices are very important to help ease the discomfort of GERD.  Eat frequent, small meals instead of three large meals each day. Eat your meals slowly, in a relaxed setting. Avoid bending over or lying down until 2-3 hours after eating.  Limit high-fat   foods such as fatty meat or fried foods. This information is not intended to replace advice given to you by your health care provider. Make sure you discuss any questions you have with your health care provider. Document Revised:  05/09/2018 Document Reviewed: 01/18/2016 Elsevier Patient Education  2020 [employer]  

## 2019-04-18 NOTE — Telephone Encounter (Signed)
No action needed

## 2019-04-21 DIAGNOSIS — M170 Bilateral primary osteoarthritis of knee: Secondary | ICD-10-CM | POA: Diagnosis not present

## 2019-04-21 DIAGNOSIS — M13862 Other specified arthritis, left knee: Secondary | ICD-10-CM | POA: Diagnosis not present

## 2019-04-22 DIAGNOSIS — M5416 Radiculopathy, lumbar region: Secondary | ICD-10-CM | POA: Diagnosis not present

## 2019-04-24 ENCOUNTER — Telehealth: Payer: Self-pay

## 2019-04-24 ENCOUNTER — Other Ambulatory Visit: Payer: Self-pay

## 2019-04-24 MED ORDER — AMPHETAMINE-DEXTROAMPHETAMINE 20 MG PO TABS: 20.0000 mg | ORAL_TABLET | Freq: Two times a day (BID) | ORAL | 0 refills | Status: DC | PRN

## 2019-04-24 NOTE — Telephone Encounter (Signed)
[name] requests a 90 day of Adderall to [facility] and a 30 day to [facility]. I did send Dr Madilyn Fireman a refill request for the 90 day of Adderall to [facility].

## 2019-04-25 ENCOUNTER — Telehealth: Payer: Self-pay | Admitting: Family Medicine

## 2019-04-25 MED ORDER — AMPHETAMINE-DEXTROAMPHETAMINE 20 MG PO TABS: 20.0000 mg | ORAL_TABLET | Freq: Two times a day (BID) | ORAL | 0 refills | Status: DC | PRN

## 2019-04-25 NOTE — Telephone Encounter (Signed)
Call pharmacy, they need to run her generic symbicort as brand symbicort per letter we received from [facility]

## 2019-04-25 NOTE — Telephone Encounter (Signed)
Patient advised.

## 2019-04-25 NOTE — Telephone Encounter (Signed)
Mail order sent.  And I just sent a 2-week supply for local pharmacy which should give her plenty of time to get the Adderall and from mail order.

## 2019-04-28 DIAGNOSIS — M170 Bilateral primary osteoarthritis of knee: Secondary | ICD-10-CM | POA: Diagnosis not present

## 2019-04-28 DIAGNOSIS — M13862 Other specified arthritis, left knee: Secondary | ICD-10-CM | POA: Diagnosis not present

## 2019-04-29 NOTE — Telephone Encounter (Signed)
Pharmacy did run the prescription for brand.

## 2019-05-15 ENCOUNTER — Ambulatory Visit (INDEPENDENT_AMBULATORY_CARE_PROVIDER_SITE_OTHER): Payer: Medicare HMO | Admitting: Family Medicine

## 2019-05-15 ENCOUNTER — Encounter: Payer: Self-pay | Admitting: Family Medicine

## 2019-05-15 ENCOUNTER — Other Ambulatory Visit: Payer: Self-pay

## 2019-05-15 VITALS — BP 137/75 | HR 98 | Ht 61.0 in | Wt 195.0 lb

## 2019-05-15 DIAGNOSIS — L814 Other melanin hyperpigmentation: Secondary | ICD-10-CM

## 2019-05-15 DIAGNOSIS — R1013 Epigastric pain: Secondary | ICD-10-CM | POA: Diagnosis not present

## 2019-05-15 DIAGNOSIS — L820 Inflamed seborrheic keratosis: Secondary | ICD-10-CM | POA: Diagnosis not present

## 2019-05-15 DIAGNOSIS — B079 Viral wart, unspecified: Secondary | ICD-10-CM

## 2019-05-15 DIAGNOSIS — L700 Acne vulgaris: Secondary | ICD-10-CM | POA: Diagnosis not present

## 2019-05-15 NOTE — Progress Notes (Signed)
Established Patient Office Visit  Subjective:  Patient ID: [name], female    DOB: 1950/07/11  Age: 69 y.o. MRN: [id number]  CC:  Chief Complaint  Patient presents with  . Follow-up    HPI [name] presents for skin lesion.  She has one on her left outer thigh that she thinks is a wart.  She also has a 2 on the left side of her abdomen 1 on each side of her neck and 1 on her left breast just above the areola around the 12 o'clock position that she would like me to look at today.  She also has lots of "old lady spots" on her arms and legs and upper chest and upper back that she would like me to look at today as well.  She also reports that she gets gets a lot of little bumps underneath the skin on her face that she squeezes and is able to get things out of.  She has several lesions on her chest and upper back that are similar.   GERD-she wanted to let me know that the omeprazole really seems to be helping she just wanted to discuss optimal timing of taking the medication.  Past Medical History:  Diagnosis Date  . ADD (attention deficit disorder)   . Arthritis    "bunion on  right" (08/11/2014)  . Chronic bronchitis ([place])    "plenty; but not q yr" (08/11/2014)  . Depression   . Dyslipidemia   . Environmental allergies   . GERD (gastroesophageal reflux disease)   . Hearing difficulty of left ear    "grew up w/deaf parent; find myself lip reading more recently" (08/11/2014)  . Hepatitis    "exposed in college; rec'd gamma globulin; 6 months later S/S (weakness, lethargy, fevers); ; dx'd infectious hepatitis"  . History of herpes genitalis    "stress induced reoccurance that shows up on my left middle finger" (08/11/2014)  . History of hiatal hernia   . History of stomach ulcers   . Hypercholesterolemia   . Hyperthyroidism 1994   "postpartum only; resolved itself"  . Incisional hernia   . Internal hemorrhoids   . Leg cramping   . Osteopenia   . Seasonal asthma   .  Stress incontinence, female   . Ulcer     Past Surgical History:  Procedure Laterality Date  . CESAREAN SECTION  1994  . CHALAZION EXCISION Left ~ 2013  . DILATION AND CURETTAGE OF UTERUS  X 2  . EYE SURGERY    . HERNIA REPAIR  08/11/2014  . INCISIONAL HERNIA REPAIR N/A 08/11/2014   Procedure: LAPAROSCOPIC INCISIONAL HERNIA REPAIR;  Surgeon: Coralie Keens, MD;  Location: [facility];  Service: General;  Laterality: N/A;  . INCONTINENCE SURGERY  2005  . INGUINAL HERNIA REPAIR Left 08/11/2014  . INGUINAL HERNIA REPAIR Left 08/11/2014   Procedure: LEFT INGUINAL HERNIA REPAIR;  Surgeon: Coralie Keens, MD;  Location: [facility];  Service: General;  Laterality: Left;  . INSERTION OF MESH Left 08/11/2014   Procedure: INSERTION OF MESH TO LEFT GROIN AND ABDOMEN;  Surgeon: Coralie Keens, MD;  Location: [facility];  Service: General;  Laterality: Left;  . LAPAROSCOPIC INCISIONAL / UMBILICAL / VENTRAL HERNIA REPAIR  08/11/2014   [facility]  . NASAL POLYP EXCISION  2014  . REPAIR OF PERFORATED ULCER  1985  . TONSILLECTOMY    . TOTAL SHOULDER ARTHROPLASTY Left 09/21/2017   Procedure: LEFT TOTAL SHOULDER ARTHROPLASTY;  Surgeon: Nicholes Stairs, MD;  Location: [facility];  Service: Orthopedics;  Laterality: Left;    Family History  Problem Relation Age of Onset  . Arthritis Mother   . Heart disease Mother        CABG @ 1  . Diabetes Mother   . Osteoporosis Mother   . Hyperlipidemia Mother   . Arthritis Father   . Lymphoma Father 78       chemo  . Cancer Father        lymphoma  . Lymphoma Paternal Grandmother   . Diabetes Maternal Grandmother   . Colon cancer Neg Hx   . Rectal cancer Neg Hx   . Esophageal cancer Neg Hx     Social History   Socioeconomic History  . Marital status: Divorced    Spouse name: Not on file  . Number of children: 1  . Years of education: 88  . Highest education level: Bachelor's degree (e.g., BA, AB, BS)  Occupational History  . Occupation: Consulting civil engineer     Comment: Full time; Barista  Tobacco Use  . Smoking status: Never Smoker  . Smokeless tobacco: Never Used  Substance and Sexual Activity  . Alcohol use: Yes    Alcohol/week: 2.0 standard drinks    Types: 1 Glasses of wine, 1 Shots of liquor per week    Comment: 08/11/2014 "0-2 glasses of wine or a mixed drink q wk"  . Drug use: No  . Sexual activity: Not Currently    Birth control/protection: Post-menopausal  Other Topics Concern  . Not on file  Social History Narrative   Marital status: divorced; not dating      Children: 1 daughter; no grandchildren      Lives:        Employment: Optometrist      Tobacco:       Alcohol:      Drugs:       Exercise:    Social Determinants of Radio broadcast assistant Strain: Low Risk   . Difficulty of Paying Living Expenses: Not hard at all  Food Insecurity: No Food Insecurity  . Worried About Charity fundraiser in the Last Year: Never true  . Ran Out of Food in the Last Year: Never true  Transportation Needs: No Transportation Needs  . Lack of Transportation (Medical): No  . Lack of Transportation (Non-Medical): No  Physical Activity: Inactive  . Days of Exercise per Week: 0 days  . Minutes of Exercise per Session: 0 min  Stress: No Stress Concern Present  . Feeling of Stress : Only a little  Social Connections: Somewhat Isolated  . Frequency of Communication with Friends and Family: More than three times a week  . Frequency of Social Gatherings with Friends and Family: Never  . Attends Religious Services: More than 4 times per year  . Active Member of Clubs or Organizations: No  . Attends Archivist Meetings: Never  . Marital Status: Divorced  Human resources officer Violence: Not At Risk  . Fear of Current or Ex-Partner: No  . Emotionally Abused: No  . Physically Abused: No  . Sexually Abused: No    Outpatient Medications Prior to Visit  Medication Sig Dispense Refill  . acetaminophen  (TYLENOL) 650 MG CR tablet Take 1,300 mg by mouth See admin instructions. Take 1,300 mg by mouth at bedtime and may also take an additional 1,300 mg one to two times a day as needed for pain    . albuterol (PROAIR  HFA) 108 (90 Base) MCG/ACT inhaler Inhale 2 puffs into the lungs every 6 (six) hours as needed for wheezing or shortness of breath. 3 Inhaler 0  . amphetamine-dextroamphetamine (ADDERALL) 20 MG tablet Take 1 tablet (20 mg total) by mouth 2 (two) times daily as needed. 30 tablet 0  . Biotin 5000 MCG CAPS Take 1 tablet by mouth daily.     . budesonide-formoterol (SYMBICORT) 160-4.5 MCG/ACT inhaler Inhale 2 puffs into the lungs 2 (two) times daily. 1 Inhaler 0  . Calcium Carb-Cholecalciferol (CALCIUM+D3 PO) Take 2 tablets by mouth daily.     . clindamycin (CLEOCIN-T) 1 % external solution Apply topically 2 (two) times daily. To affected area/bumps 30 mL 0  . cyclobenzaprine (FLEXERIL) 10 MG tablet Take 1 tablet (10 mg total) by mouth at bedtime as needed (for sleep or pain). 30 tablet 0  . DULoxetine (CYMBALTA) 30 MG capsule 2 caps orally in AM and 1 in PM 270 capsule 1  . fesoterodine (TOVIAZ) 8 MG TB24 tablet Take 1 tablet (8 mg total) by mouth daily. 90 tablet 3  . Glucos-Chond-Hyal Ac-Ca Fructo (MOVE FREE JOINT HEALTH ADVANCE) TABS Take 2 tablets by mouth daily. Move free joint health Advanced formula with MSM/Vit D3 glucosamine and Chondroitin    . ipratropium (ATROVENT) 0.03 % nasal spray Place 2 sprays into the nose 4 (four) times daily. Use as needed for runny nose and congestion 30 mL 1  . loratadine (CLARITIN) 10 MG tablet Take 1 tablet (10 mg total) by mouth daily. 90 tablet 3  . Multiple Vitamins-Minerals (ONE-A-DAY WOMENS 50 PLUS) TABS Take 1 tablet by mouth daily.    . NON FORMULARY Take 1 tablet by mouth daily. Viviscal    . omeprazole (PRILOSEC) 40 MG capsule Take 1 capsule (40 mg total) by mouth daily. 30 capsule 3  . simvastatin (ZOCOR) 40 MG tablet Take 1 tablet (40 mg  total) by mouth at bedtime. 90 tablet 4  . Turmeric Curcumin 500 MG CAPS Take 1 capsule by mouth daily.     No facility-administered medications prior to visit.    Allergies  Allergen Reactions  . Aspirin Other (See Comments)    GI Intolerance; History of ulcers; GI BLEED  . Bee Venom Swelling    Any insects that bite or stings- swelling at site of sting or cellulitis can develop  . Nsaids Other (See Comments)    History of ulcers  . Vesicare [Solifenacin] Itching    ROS Review of Systems    Objective:    Physical Exam  BP 137/75   Pulse 98   Ht 5\' 1"  (1.549 m)   Wt 195 lb (88.5 kg)   LMP 08/30/2008   SpO2 99%   BMI 36.84 kg/m  Wt Readings from Last 3 Encounters:  05/15/19 195 lb (88.5 kg)  04/17/19 191 lb (86.6 kg)  10/03/18 191 lb 3.2 oz (86.7 kg)     Health Maintenance Due  Topic Date Due  . URINE MICROALBUMIN  Never done    There are no preventive care reminders to display for this patient.  Lab Results  Component Value Date   TSH 1.73 09/25/2018   Lab Results  Component Value Date   WBC 7.4 11/13/2017   HGB 13.7 11/13/2017   HCT 41.7 11/13/2017   MCV 83.4 11/13/2017   PLT 418 (H) 11/13/2017   Lab Results  Component Value Date   NA 142 09/25/2018   K 5.1 09/25/2018   CO2 27 09/25/2018  GLUCOSE 101 (H) 09/25/2018   BUN 17 09/25/2018   CREATININE 0.85 09/25/2018   BILITOT 0.3 07/18/2018   ALKPHOS 104 07/02/2017   AST 20 07/18/2018   ALT 20 07/18/2018   PROT 6.9 07/18/2018   ALBUMIN 4.3 07/02/2017   CALCIUM 9.7 09/25/2018   ANIONGAP 7 09/22/2017   GFR 74.97 03/19/2013   Lab Results  Component Value Date   CHOL 209 (H) 09/25/2018   Lab Results  Component Value Date   HDL 61 09/25/2018   Lab Results  Component Value Date   LDLCALC 123 (H) 09/25/2018   Lab Results  Component Value Date   TRIG 137 09/25/2018   Lab Results  Component Value Date   CHOLHDL 3.4 09/25/2018   Lab Results  Component Value Date   HGBA1C 6.1 (H)  06/19/2018      Assessment & Plan:   Problem List Items Addressed This Visit      Other   Dyspepsia    Feels like the omeprazole has been working really well.  Continue with current regimen.  Discussed timing options for taking the medication.       Other Visit Diagnoses    Seborrheic keratoses, inflamed    -  Primary   Viral warts, unspecified type       Comedone       Solar lentigo         Seborrheic keratoses-we discussed treatment options including cryotherapy.  She reports that they get irritated and rub on her close.  Patient would like to have those treated today.  Wart on the left outer thigh-again discussed treatment with cryotherapy.  Patient would like to proceed.  Solar lentigo-she has a lot of very large approximately 0.5-1 and half centimeter sized oval-shaped lentigo on her legs arms upper chest and upper back area.  Comedone/epidermal cyst on her face-recommend a trial of Differin.  Did discuss how to gradually increase frequency of use of the medication.  Cryotherapy Procedure Note  Pre-operative Diagnosis: Seborrheic keratoses and wart  Post-operative Diagnosis: same  Locations: She has a wart on her left outer thigh.  She has 2 seborrheic keratoses on her left side, one on her left breast, one on each side of her neck 1.   Indications: Irritation.   Procedure Details  Patient informed of risks (permanent scarring, infection, light or dark discoloration, bleeding, infection, weakness, numbness and recurrence of the lesion) and benefits of the procedure and verbal informed consent obtained.  The areas are treated with liquid nitrogen therapy, frozen until ice ball extended 1-2 mm beyond lesion, allowed to thaw, and treated again. The patient tolerated procedure well.  The patient was instructed on post-op care, warned that there may be blister formation, redness and pain. Recommend OTC analgesia as needed for  pain.  Condition: Stable  Complications: none.  Plan: 1. Instructed to keep the area dry and covered for 24-48h and clean thereafter. 2. Warning signs of infection were reviewed.   3. Recommended that the patient use OTC acetaminophen as needed for pain.  4. Return PRN.     No orders of the defined types were placed in this encounter.   Follow-up: Return if symptoms worsen or fail to improve.    Beatrice Lecher, MD

## 2019-05-15 NOTE — Assessment & Plan Note (Addendum)
Feels like the omeprazole has been working really well.  Continue with current regimen.  Discussed timing options for taking the medication.

## 2019-05-15 NOTE — Patient Instructions (Signed)
Okay to wash the areas normally in the shower.  Do not scrub at them.  Just gentle soap and water.  Pat dry.  Once the lesions start to peel off then please apply Vaseline twice a day.  Again do not peel the skin or pick at them.  Recommend a trial of an over-the-counter Differin cream.  Is usually found in the acne section.  Is a little bit expensive but can be applied 3 times a week.  It can cause some redness, flaking and tenderness of the skin.  If this occurs then you may need to space the days in between use a little bit more.  If after a month you are tolerating it well then you can increase to every other day and then gradually build up to daily as your skin tolerates it.  It should help with some of the blackheads and comedones that you are getting on your face.

## 2019-05-15 NOTE — Addendum Note (Signed)
Addended by: Teddy Spike on: 05/15/2019 02:40 PM   Modules accepted: Orders

## 2019-06-12 DIAGNOSIS — M25562 Pain in left knee: Secondary | ICD-10-CM | POA: Diagnosis not present

## 2019-06-12 DIAGNOSIS — M25561 Pain in right knee: Secondary | ICD-10-CM | POA: Diagnosis not present

## 2019-07-02 ENCOUNTER — Other Ambulatory Visit: Payer: Self-pay

## 2019-07-02 MED ORDER — BUDESONIDE-FORMOTEROL FUMARATE 160-4.5 MCG/ACT IN AERO: 2.0000 | INHALATION_SPRAY | Freq: Two times a day (BID) | RESPIRATORY_TRACT | 0 refills | Status: DC

## 2019-07-07 ENCOUNTER — Telehealth: Payer: Self-pay | Admitting: *Deleted

## 2019-07-07 ENCOUNTER — Encounter: Payer: Self-pay | Admitting: Family Medicine

## 2019-07-07 ENCOUNTER — Other Ambulatory Visit: Payer: Self-pay

## 2019-07-07 ENCOUNTER — Ambulatory Visit (INDEPENDENT_AMBULATORY_CARE_PROVIDER_SITE_OTHER): Payer: Medicare HMO | Admitting: Family Medicine

## 2019-07-07 VITALS — BP 135/78 | HR 98 | Ht 61.0 in | Wt 186.0 lb

## 2019-07-07 DIAGNOSIS — F901 Attention-deficit hyperactivity disorder, predominantly hyperactive type: Secondary | ICD-10-CM | POA: Diagnosis not present

## 2019-07-07 DIAGNOSIS — R252 Cramp and spasm: Secondary | ICD-10-CM | POA: Insufficient documentation

## 2019-07-07 DIAGNOSIS — R21 Rash and other nonspecific skin eruption: Secondary | ICD-10-CM | POA: Diagnosis not present

## 2019-07-07 DIAGNOSIS — J4541 Moderate persistent asthma with (acute) exacerbation: Secondary | ICD-10-CM | POA: Diagnosis not present

## 2019-07-07 DIAGNOSIS — R7301 Impaired fasting glucose: Secondary | ICD-10-CM | POA: Diagnosis not present

## 2019-07-07 DIAGNOSIS — K5909 Other constipation: Secondary | ICD-10-CM | POA: Diagnosis not present

## 2019-07-07 LAB — POCT GLYCOSYLATED HEMOGLOBIN (HGB A1C): Hemoglobin A1C: 6 % — AB (ref 4.0–5.6)

## 2019-07-07 MED ORDER — LISDEXAMFETAMINE DIMESYLATE 30 MG PO CAPS: 30.0000 mg | ORAL_CAPSULE | Freq: Every day | ORAL | 0 refills | Status: DC

## 2019-07-07 NOTE — Telephone Encounter (Signed)
LVM asking that she come in earlier today.

## 2019-07-07 NOTE — Patient Instructions (Addendum)
You can try B6 30mg  daily for leg cramps. Hydrate well. If not helping then can try the magnesium ( it can help you bowels move as well).   Your A1C looks good.  Still in the prediabetes range but stable.  Just continue to work on good healthy food choices and regular exercise.  If you would like to try Vyvanse when you are due for your refill on her ADD medication just let me know we can send over new prescription to the pharmacy and try it for 30 days and see what you think.  If the nausea continues after you stop the calcium and get your bowels moving more regularly then please let me know.  We may need to work it up further.  Okay to change Symbicort to 1 puff twice a day.

## 2019-07-07 NOTE — Assessment & Plan Note (Signed)
Gust options.  We certainly could try Vyvanse.  Can always send over prescription for when her current regimen runs out.  Or we could even consider something like methylphenidate as an option just for comparison.  Can also see if there is less effect on her sleep and she just feels like it is working a little better.  She does take it first thing in the morning on an empty stomach but says she does not always get up at the same time.

## 2019-07-07 NOTE — Assessment & Plan Note (Signed)
She recently started a probiotic.  Discussed still think she would benefit from being on a stool softener daily I think this can make a big difference in helping to move things through her colon might even help with some of her nausea and belching and fullness that she has been experiencing.  We also discussed maybe holding off on the calcium for now that can also be little constipating to see if that is helpful.  Can always try restarting it later

## 2019-07-07 NOTE — Assessment & Plan Note (Signed)
Using Symbicort 2 puffs in the mornings.  She does feel like this has been really helpful she has not had to use her albuterol recently we discussed stepup and stepdown therapy with asthma.  She would like to try doing 1 puff twice a day instead of 2 puffs in the morning which I think is perfectly fine I think she wants to do that through the summer but at some point it may be worth trying to decrease her medication down to just inhaled corticosteroid as needed.

## 2019-07-07 NOTE — Assessment & Plan Note (Signed)
Once he is 6.0 today which is fantastic it was 6.1 a year ago continue work on healthy diet and regular exercise and plan to follow-up in 6 months.  Lab Results  Component Value Date   HGBA1C 6.0 (A) 07/07/2019

## 2019-07-07 NOTE — Progress Notes (Signed)
Established Patient Office Visit  Subjective:  Patient ID: [name], female    DOB: Nov 24, 1950  Age: 69 y.o. MRN: [id number]  CC:  Chief Complaint  Patient presents with   Follow-up    HPI [name] presents for waves of nausea. She is on PPI, omeprazole.  She feels like overall she is doing well but more recently has been having some problem with waves of nausea that tend to hit to the point where she feels like she is going to vomit but has not actually vomited.  She says she will notice she will belch more and feels full and bloated.  She recently had stopped all of her supplements for about 6 weeks and then restarted them.  More recently she had restarted her calcium.  She wonders if that could be the culprit.  She still feels like her bowels are moving slowly.  ADD-she wonders if she could try something like Vyvanse. She has plenty of her current medication. She feels like it is not working like it once was.    Follow-up asthma-she is currently on Symbicort she is been doing 2 puffs in the morning.  She has not had to use her rescue inhaler she is actually been doing well overall.  He does have a couple of bumps on the back of her scalp she says the clindamycin does not really seem to be helping she is also been trying antidandruff shampoo.   She also wants me look at the skin lesions where we did cryotherapy.  Some of them have a purplish discoloration and some of them still have a scab in place.  We did the cryotherapy almost 8 weeks ago.  She also complains of leg cramps.  She says she does not go around barefoot she wears good supportive shoes.  She does hydrate.  Past Medical History:  Diagnosis Date   ADD (attention deficit disorder)    Arthritis    "bunion on  right" (08/11/2014)   Chronic bronchitis (English)    "plenty; but not q yr" (08/11/2014)   Depression    Dyslipidemia    Environmental allergies    GERD (gastroesophageal reflux disease)    Hearing  difficulty of left ear    "grew up w/deaf parent; find myself lip reading more recently" (08/11/2014)   Hepatitis    "exposed in college; rec'd gamma globulin; 6 months later S/S (weakness, lethargy, fevers); ; dx'd infectious hepatitis"   History of herpes genitalis    "stress induced reoccurance that shows up on my left middle finger" (08/11/2014)   History of hiatal hernia    History of stomach ulcers    Hypercholesterolemia    Hyperthyroidism 1994   "postpartum only; resolved itself"   Incisional hernia    Internal hemorrhoids    Leg cramping    Osteopenia    Seasonal asthma    Stress incontinence, female    Ulcer     Past Surgical History:  Procedure Laterality Date   CESAREAN SECTION  1994   CHALAZION EXCISION Left ~ 2013   DILATION AND CURETTAGE OF UTERUS  X 2   EYE SURGERY     HERNIA REPAIR  08/11/2014   INCISIONAL HERNIA REPAIR N/A 08/11/2014   Procedure: LAPAROSCOPIC INCISIONAL HERNIA REPAIR;  Surgeon: Coralie Keens, MD;  Location: [facility];  Service: General;  Laterality: N/A;   INCONTINENCE SURGERY  2005   INGUINAL HERNIA REPAIR Left 08/11/2014   INGUINAL HERNIA REPAIR Left 08/11/2014  Procedure: LEFT INGUINAL HERNIA REPAIR;  Surgeon: Coralie Keens, MD;  Location: [facility];  Service: General;  Laterality: Left;   INSERTION OF MESH Left 08/11/2014   Procedure: INSERTION OF MESH TO LEFT GROIN AND ABDOMEN;  Surgeon: Coralie Keens, MD;  Location: [facility];  Service: General;  Laterality: Left;   LAPAROSCOPIC INCISIONAL / UMBILICAL / [facility]  08/11/2014   IHR   NASAL POLYP EXCISION  2014   REPAIR OF PERFORATED ULCER  1985   TONSILLECTOMY     TOTAL SHOULDER ARTHROPLASTY Left 09/21/2017   Procedure: LEFT TOTAL SHOULDER ARTHROPLASTY;  Surgeon: Nicholes Stairs, MD;  Location: [facility];  Service: Orthopedics;  Laterality: Left;    Family History  Problem Relation Age of Onset   Arthritis Mother    Heart disease Mother         CABG @ 60   Diabetes Mother    Osteoporosis Mother    Hyperlipidemia Mother    Arthritis Father    Lymphoma Father 27       chemo   Cancer Father        lymphoma   Lymphoma Paternal Grandmother    Diabetes Maternal Grandmother    Colon cancer Neg Hx    Rectal cancer Neg Hx    Esophageal cancer Neg Hx     Social History   Socioeconomic History   Marital status: Divorced    Spouse name: Not on file   Number of children: 1   Years of education: 16   Highest education level: Bachelor's degree (e.g., BA, AB, BS)  Occupational History   Occupation: Consulting civil engineer    Comment: Full time; Barista  Tobacco Use   Smoking status: Never Smoker   Smokeless tobacco: Never Used  Substance and Sexual Activity   Alcohol use: Yes    Alcohol/week: 2.0 standard drinks    Types: 1 Glasses of wine, 1 Shots of liquor per week    Comment: 08/11/2014 "0-2 glasses of wine or a mixed drink q wk"   Drug use: No   Sexual activity: Not Currently    Birth control/protection: Post-menopausal  Other Topics Concern   Not on file  Social History Narrative   Marital status: divorced; not dating      Children: 1 daughter; no grandchildren      Lives:        Employment: Optometrist      Tobacco:       Alcohol:      Drugs:       Exercise:    Social Determinants of Radio broadcast assistant Strain: Low Risk    Difficulty of Paying Living Expenses: Not hard at all  Food Insecurity: No Food Insecurity   Worried About Charity fundraiser in the Last Year: Never true   Arboriculturist in the Last Year: Never true  Transportation Needs: No Transportation Needs   Lack of Transportation (Medical): No   Lack of Transportation (Non-Medical): No  Physical Activity: Inactive   Days of Exercise per Week: 0 days   Minutes of Exercise per Session: 0 min  Stress: No Stress Concern Present   Feeling of Stress : Only a little  Social Connections:  Somewhat Isolated   Frequency of Communication with Friends and Family: More than three times a week   Frequency of Social Gatherings with Friends and Family: Never   Attends Religious Services: More than 4 times per year   Active  Member of Clubs or Organizations: No   Attends Music therapist: Never   Marital Status: Divorced  Human resources officer Violence: Not At Risk   Fear of Current or Ex-Partner: No   Emotionally Abused: No   Physically Abused: No   Sexually Abused: No    Outpatient Medications Prior to Visit  Medication Sig Dispense Refill   acetaminophen (TYLENOL) 650 MG CR tablet Take 1,300 mg by mouth See admin instructions. Take 1,300 mg by mouth at bedtime and may also take an additional 1,300 mg one to two times a day as needed for pain     albuterol (PROAIR HFA) 108 (90 Base) MCG/ACT inhaler Inhale 2 puffs into the lungs every 6 (six) hours as needed for wheezing or shortness of breath. 3 Inhaler 0   budesonide-formoterol (SYMBICORT) 160-4.5 MCG/ACT inhaler Inhale 2 puffs into the lungs 2 (two) times daily. 1 Inhaler 0   Calcium Carb-Cholecalciferol (CALCIUM+D3 PO) Take 2 tablets by mouth daily.      clindamycin (CLEOCIN-T) 1 % external solution Apply topically 2 (two) times daily. To affected area/bumps 30 mL 0   cyclobenzaprine (FLEXERIL) 10 MG tablet Take 1 tablet (10 mg total) by mouth at bedtime as needed (for sleep or pain). 30 tablet 0   DULoxetine (CYMBALTA) 30 MG capsule 2 caps orally in AM and 1 in PM 270 capsule 1   fesoterodine (TOVIAZ) 8 MG TB24 tablet Take 1 tablet (8 mg total) by mouth daily. 90 tablet 3   Glucos-Chond-Hyal Ac-Ca Fructo (MOVE FREE JOINT HEALTH ADVANCE) TABS Take 2 tablets by mouth daily. Move free joint health Advanced formula with MSM/Vit D3 glucosamine and Chondroitin     loratadine (CLARITIN) 10 MG tablet Take 1 tablet (10 mg total) by mouth daily. 90 tablet 3   Multiple Vitamins-Minerals (ONE-A-DAY WOMENS 50  PLUS) TABS Take 1 tablet by mouth daily.     NON FORMULARY Take 1 tablet by mouth daily. Viviscal     omeprazole (PRILOSEC) 40 MG capsule Take 1 capsule (40 mg total) by mouth daily. 30 capsule 3   simvastatin (ZOCOR) 40 MG tablet Take 1 tablet (40 mg total) by mouth at bedtime. 90 tablet 4   Turmeric Curcumin 500 MG CAPS Take 1 capsule by mouth daily.     amphetamine-dextroamphetamine (ADDERALL) 20 MG tablet Take 1 tablet (20 mg total) by mouth 2 (two) times daily as needed. 30 tablet 0   No facility-administered medications prior to visit.    Allergies  Allergen Reactions   Aspirin Other (See Comments)    GI Intolerance; History of ulcers; GI BLEED   Bee Venom Swelling    Any insects that bite or stings- swelling at site of sting or cellulitis can develop   Nsaids Other (See Comments)    History of ulcers   Vesicare [Solifenacin] Itching    ROS Review of Systems    Objective:    Physical Exam  Constitutional: She is oriented to person, place, and time. She appears well-developed and well-nourished.  HENT:  Head: Normocephalic and atraumatic.  Cardiovascular: Normal rate, regular rhythm and normal heart sounds.  Pulmonary/Chest: Effort normal and breath sounds normal.  Neurological: She is alert and oriented to person, place, and time.  Skin: Skin is warm and dry.  The areas that we froze do have a purplish discoloration to them the one on the left breast looks like it is getting a little bit of white scarring in the center.  And if you look like  they still have a dry crusting scab in the center.  Psychiatric: She has a normal mood and affect. Her behavior is normal.    BP 135/78    Pulse 98    Ht 5\' 1"  (1.549 m)    Wt 186 lb (84.4 kg)    LMP 08/30/2008    SpO2 98%    BMI 35.14 kg/m  Wt Readings from Last 3 Encounters:  07/07/19 186 lb (84.4 kg)  05/15/19 195 lb (88.5 kg)  04/17/19 191 lb (86.6 kg)     There are no preventive care reminders to display for  this patient.  There are no preventive care reminders to display for this patient.  Lab Results  Component Value Date   TSH 1.73 09/25/2018   Lab Results  Component Value Date   WBC 7.4 11/13/2017   HGB 13.7 11/13/2017   HCT 41.7 11/13/2017   MCV 83.4 11/13/2017   PLT 418 (H) 11/13/2017   Lab Results  Component Value Date   NA 142 09/25/2018   K 5.1 09/25/2018   CO2 27 09/25/2018   GLUCOSE 101 (H) 09/25/2018   BUN 17 09/25/2018   CREATININE 0.85 09/25/2018   BILITOT 0.3 07/18/2018   ALKPHOS 104 07/02/2017   AST 20 07/18/2018   ALT 20 07/18/2018   PROT 6.9 07/18/2018   ALBUMIN 4.3 07/02/2017   CALCIUM 9.7 09/25/2018   ANIONGAP 7 09/22/2017   GFR 74.97 03/19/2013   Lab Results  Component Value Date   CHOL 209 (H) 09/25/2018   Lab Results  Component Value Date   HDL 61 09/25/2018   Lab Results  Component Value Date   LDLCALC 123 (H) 09/25/2018   Lab Results  Component Value Date   TRIG 137 09/25/2018   Lab Results  Component Value Date   CHOLHDL 3.4 09/25/2018   Lab Results  Component Value Date   HGBA1C 6.0 (A) 07/07/2019      Assessment & Plan:   Problem List Items Addressed This Visit      Respiratory   Asthma    Using Symbicort 2 puffs in the mornings.  She does feel like this has been really helpful she has not had to use her albuterol recently we discussed stepup and stepdown therapy with asthma.  She would like to try doing 1 puff twice a day instead of 2 puffs in the morning which I think is perfectly fine I think she wants to do that through the summer but at some point it may be worth trying to decrease her medication down to just inhaled corticosteroid as needed.        Digestive   Chronic constipation    She recently started a probiotic.  Discussed still think she would benefit from being on a stool softener daily I think this can make a big difference in helping to move things through her colon might even help with some of her nausea  and belching and fullness that she has been experiencing.  We also discussed maybe holding off on the calcium for now that can also be little constipating to see if that is helpful.  Can always try restarting it later        Endocrine   IFG (impaired fasting glucose)    Once he is 6.0 today which is fantastic it was 6.1 a year ago continue work on healthy diet and regular exercise and plan to follow-up in 6 months.  Lab Results  Component Value Date  HGBA1C 6.0 (A) 07/07/2019         Relevant Orders   POCT glycosylated hemoglobin (Hb A1C) (Completed)     Other   Leg cramps    Leg cramps.  Recommend a trial of B6, if not helping, ok to restart magnesium.        ADD (attention deficit disorder)    Gust options.  We certainly could try Vyvanse.  Can always send over prescription for when her current regimen runs out.  Or we could even consider something like methylphenidate as an option just for comparison.  Can also see if there is less effect on her sleep and she just feels like it is working a little better.  She does take it first thing in the morning on an empty stomach but says she does not always get up at the same time.       Other Visit Diagnoses    Rash    -  Primary     Rash-she gets occasional bumps on the back of her scalp.  And occasionally ones on the top.  Unclear if this could just be folliculitis or if they could actually be some seborrheic keratoses unfortunately she does scratch and pick at them so it makes it difficult to ascertain the true nature of the lesions.  Skin where we did the cryotherapy does have a purplish discoloration a couple of the areas have some crusting to them.  Avoid picking and scratching at the areas recommend applying Vaseline twice a day to the areas for healing the discoloration should fade over the next several months.  She also asked if I would heard anything about taking not turmeric daily.  As far as I am aware it is safe to use  regularly I had not heard of any studies etc. that showed potential harm.  Meds ordered this encounter  Medications   lisdexamfetamine (VYVANSE) 30 MG capsule    Sig: Take 1 capsule (30 mg total) by mouth daily.    Dispense:  30 capsule    Refill:  0    Follow-up: Return in about 4 months (around 11/06/2019) for ADD medication/Asthma.   Spent 42 minutes in encounter.  Beatrice Lecher, MD

## 2019-07-07 NOTE — Assessment & Plan Note (Signed)
Leg cramps.  Recommend a trial of B6, if not helping, ok to restart magnesium.

## 2019-07-08 ENCOUNTER — Telehealth: Payer: Self-pay | Admitting: Family Medicine

## 2019-07-08 NOTE — Telephone Encounter (Signed)
Call pt: Vyvanse is not covered on insurance. They will cover methylphenidate, atomoxetine, or  The adderall.

## 2019-07-08 NOTE — Telephone Encounter (Signed)
Received fax for [place] on Vyvanse sent through cover my meds waiting on determination. - CF 

## 2019-07-08 NOTE — Telephone Encounter (Signed)
Received fax from [facility] they denied coverage on Vyvanse due to drug is non formulary. I am placing in providers box for review. - CF

## 2019-07-09 ENCOUNTER — Encounter: Payer: Self-pay | Admitting: Family Medicine

## 2019-07-09 NOTE — Telephone Encounter (Signed)
[name] will call us back when she has more information.

## 2019-07-22 MED ORDER — ATOMOXETINE HCL 40 MG PO CAPS: 40.0000 mg | ORAL_CAPSULE | Freq: Every day | ORAL | 1 refills | Status: DC

## 2019-07-22 NOTE — Telephone Encounter (Signed)
Patient states she has tried dexmethylphenidate >10 years ago and it did not work (short term helped but stopped working). Adderall is what she was on and states that she had to take so much of it for it to work it then kept her up at night.   She is OK to try a 30 day supply of atomoxetine. Requests this be sent to [facility]

## 2019-07-22 NOTE — Telephone Encounter (Signed)
New rx sent for atomoxetine. We can f/u in 4 weeks to make sure doing ok on new med.

## 2019-07-22 NOTE — Addendum Note (Signed)
Addended by: Beatrice Lecher D on: 07/22/2019 07:22 PM   Modules accepted: Orders

## 2019-07-23 NOTE — Telephone Encounter (Signed)
Patient advised and scheduled.  

## 2019-08-08 ENCOUNTER — Encounter: Payer: Self-pay | Admitting: Medical-Surgical

## 2019-08-08 ENCOUNTER — Telehealth (INDEPENDENT_AMBULATORY_CARE_PROVIDER_SITE_OTHER): Payer: Medicare HMO | Admitting: Medical-Surgical

## 2019-08-08 VITALS — Temp 99.2°F

## 2019-08-08 DIAGNOSIS — H1033 Unspecified acute conjunctivitis, bilateral: Secondary | ICD-10-CM | POA: Diagnosis not present

## 2019-08-08 NOTE — Progress Notes (Signed)
Virtual Visit via Video Note  I connected with [name] on 08/08/19 at  4:00 PM EDT by a video enabled telemedicine application and verified that I am speaking with the correct person using two identifiers.   I discussed the limitations of evaluation and management by telemedicine and the availability of in person appointments. The patient expressed understanding and agreed to proceed.  Patient location: home Provider locations: office  Subjective:    CC: eye redness/irritation  HPI: 70 year old female presenting via [facility] [place] video visit for reports of left eye irritation and redness that she noticed this morning on awakening.  She noted last night that she was having to wipe her left eye frequently due to a filmy buildup at the corner.  Her eyelashes along the outer corner of the left eye are stuck together today.  She reports that the left eye feels swollen and has been tearing. Discharge is clear/filmy white.  She also notes that her right eye was somewhat sore to touch over the eyelid yesterday.  Both eyes have been itching but the left is greater than the right.  She does wear contacts and glasses but has not been wearing her contacts since the symptoms started yesterday.  Denies fever/chills, headache, blurred vision, purulent eye drainage.  Has now been taking Benadryl but does endorse taking Claritin every morning.  Does not wear make-up.  No new chemical/cosmetic/medication exposures.  Notes that she and her daughter have been vacuuming quite frequently lately and wonders if, while emptying the vacuum, some particulate matter may have gotten into her eyes.  Past medical history, Surgical history, Family history not pertinant except as noted below, Social history, Allergies, and medications have been entered into the medical record, reviewed, and corrections made.   Review of Systems: See HPI for pertinent positives and negatives.   Objective:    General: Speaking clearly in  complete sentences without any shortness of breath.  Alert and oriented x3.  Normal judgment. No apparent acute distress.  Bilateral sclera white with mild-moderate injection noted.  Mild puffiness to the left lower eyelid.  Impression and Recommendations:    1. Acute conjunctivitis of both eyes, unspecified acute conjunctivitis type Symptoms suspicious for allergic conjunctivitis but cannot rule out viral conjunctivitis.  Discussed avoidance of known allergens and maintenance of adequate hygiene to prevent spread if this does happen to be viral.  Patient doing very well overall with handwashing/precautions.  As her insurance does not cover it, recommend starting twice daily olpatadine 0.1% eyedrops bilaterally.  May use warm/cool compresses as tolerated.  Avoid touching the eyes as much as possible.  If itching continues, may consider adding Benadryl every 6 hours as needed.  If no improvement in symptoms by Monday, advised her to reach out to the office for further evaluation/advice.  Return if symptoms worsen or fail to improve.  30 minutes of non-face-to-face time was provided during this encounter.  I discussed the assessment and treatment plan with the patient. The patient was provided an opportunity to ask questions and all were answered. The patient agreed with the plan and demonstrated an understanding of the instructions.   The patient was advised to call back or seek an in-person evaluation if the symptoms worsen or if the condition fails to improve as anticipated.  Clearnce Sorrel, DNP, APRN, FNP-BC [facility]

## 2019-08-11 ENCOUNTER — Telehealth: Payer: Medicare HMO | Admitting: Medical-Surgical

## 2019-08-18 ENCOUNTER — Other Ambulatory Visit: Payer: Self-pay

## 2019-08-18 MED ORDER — CYCLOBENZAPRINE HCL 10 MG PO TABS: 10.0000 mg | ORAL_TABLET | Freq: Every evening | ORAL | 0 refills | Status: DC | PRN

## 2019-08-21 DIAGNOSIS — M5136 Other intervertebral disc degeneration, lumbar region: Secondary | ICD-10-CM | POA: Diagnosis not present

## 2019-08-22 ENCOUNTER — Encounter: Payer: Self-pay | Admitting: Family Medicine

## 2019-08-22 ENCOUNTER — Ambulatory Visit (INDEPENDENT_AMBULATORY_CARE_PROVIDER_SITE_OTHER): Payer: Medicare HMO | Admitting: Family Medicine

## 2019-08-22 VITALS — BP 111/60 | HR 87 | Ht 61.0 in | Wt 187.0 lb

## 2019-08-22 DIAGNOSIS — M7918 Myalgia, other site: Secondary | ICD-10-CM | POA: Diagnosis not present

## 2019-08-22 DIAGNOSIS — G8929 Other chronic pain: Secondary | ICD-10-CM | POA: Diagnosis not present

## 2019-08-22 DIAGNOSIS — F428 Other obsessive-compulsive disorder: Secondary | ICD-10-CM

## 2019-08-22 DIAGNOSIS — F5101 Primary insomnia: Secondary | ICD-10-CM | POA: Diagnosis not present

## 2019-08-22 DIAGNOSIS — N3281 Overactive bladder: Secondary | ICD-10-CM

## 2019-08-22 DIAGNOSIS — F901 Attention-deficit hyperactivity disorder, predominantly hyperactive type: Secondary | ICD-10-CM | POA: Diagnosis not present

## 2019-08-22 MED ORDER — CYCLOBENZAPRINE HCL 10 MG PO TABS: 10.0000 mg | ORAL_TABLET | Freq: Every evening | ORAL | 3 refills | Status: DC | PRN

## 2019-08-22 NOTE — Assessment & Plan Note (Signed)
She wants to finish out the Strattera that she has for the remaining week.  And then she still has some of the amphetamine salts and says we will go back to trying those and let me know which she would like to do.  For now I am just going to remove the Strattera from her medication list.  Again the Vyvanse was not covered by insurance

## 2019-08-22 NOTE — Progress Notes (Signed)
Established Patient Office Visit  Subjective:  Patient ID: [name], female    DOB: 1950/05/25  Age: 69 y.o. MRN: [id number]  CC: No chief complaint on file.   HPI [name] presents for follow-up of ADD.  We decided to try switching her to Vyvanse she felt like her previous medication just was not as effective as it had been previously.  Unfortunately the Vyvanse was not covered.  She decided to try a nonstimulant so we sent over prescription for Strattera.  She is been on it for almost a month not quite.  She says she really has not felt like it is been all that effective and she still staying up late at night which is one of the reasons that she wanted to come off of the Adderall.  So she feels like its not really impacted that in a positive way.  Today is a 6-week follow-up.  Lately she has been falling asleep around 4 or 5:00 in the morning.  She says really just comes all over the place.  She tries to get at least 6 to 6-1/2 hours of sleep.  Part of the issue to is that she feels her OCD has really kicked in and social start doing something like cleaning up the kitchen for hours before she will finally go to bed.  She also feels like her bladder has been impacting her sleep.  She says when she does not take her Lisbeth Ply she has urinate about 1-1/2 hours at night while asleep but on the Lisbeth Ply it is more like every 3 hours but she still having incontinence and still feels like it is waking her up frequently that it is bothersome.  She has been having some constipation issues has been taking a stool softener at least 6 days/week.  But that does seem to help some.  But she is still passing balls of stool.  Please see note below in regards to her overactive bladder.  Currently on Toviaz but planning on getting back in with urology because she is not quite satisfied with the results.  Is been using the Flexeril fairly regularly.  She usually takes 1 in the early morning before going to bed  to help with some of her chronic musculoskeletal pain it does work well.  Also seems to help her rest and sleep well.  Past Medical History:  Diagnosis Date  . ADD (attention deficit disorder)   . Arthritis    "bunion on  right" (08/11/2014)  . Chronic bronchitis ([place])    "plenty; but not q yr" (08/11/2014)  . Depression   . Dyslipidemia   . Environmental allergies   . GERD (gastroesophageal reflux disease)   . Hearing difficulty of left ear    "grew up w/deaf parent; find myself lip reading more recently" (08/11/2014)  . Hepatitis    "exposed in college; rec'd gamma globulin; 6 months later S/S (weakness, lethargy, fevers); ; dx'd infectious hepatitis"  . History of herpes genitalis    "stress induced reoccurance that shows up on my left middle finger" (08/11/2014)  . History of hiatal hernia   . History of stomach ulcers   . Hypercholesterolemia   . Hyperthyroidism 1994   "postpartum only; resolved itself"  . Incisional hernia   . Internal hemorrhoids   . Leg cramping   . Osteopenia   . Seasonal asthma   . Stress incontinence, female   . Ulcer     Past Surgical History:  Procedure Laterality  Date  . [place]  . CHALAZION EXCISION Left ~ 2013  . DILATION AND CURETTAGE OF UTERUS  X 2  . EYE SURGERY    . HERNIA REPAIR  08/11/2014  . INCISIONAL HERNIA REPAIR N/A 08/11/2014   Procedure: LAPAROSCOPIC INCISIONAL HERNIA REPAIR;  Surgeon: Coralie Keens, MD;  Location: [facility];  Service: General;  Laterality: N/A;  . INCONTINENCE SURGERY  2005  . INGUINAL HERNIA REPAIR Left 08/11/2014  . INGUINAL HERNIA REPAIR Left 08/11/2014   Procedure: LEFT INGUINAL HERNIA REPAIR;  Surgeon: Coralie Keens, MD;  Location: [facility];  Service: General;  Laterality: Left;  . INSERTION OF MESH Left 08/11/2014   Procedure: INSERTION OF MESH TO LEFT GROIN AND ABDOMEN;  Surgeon: Coralie Keens, MD;  Location: [facility];  Service: General;  Laterality: Left;  . LAPAROSCOPIC INCISIONAL / UMBILICAL  / VENTRAL HERNIA REPAIR  08/11/2014   [facility]  . NASAL POLYP EXCISION  2014  . REPAIR OF PERFORATED ULCER  1985  . TONSILLECTOMY    . TOTAL SHOULDER ARTHROPLASTY Left 09/21/2017   Procedure: LEFT TOTAL SHOULDER ARTHROPLASTY;  Surgeon: Nicholes Stairs, MD;  Location: [facility];  Service: Orthopedics;  Laterality: Left;    Family History  Problem Relation Age of Onset  . Arthritis Mother   . Heart disease Mother        CABG @ 34  . Diabetes Mother   . Osteoporosis Mother   . Hyperlipidemia Mother   . Arthritis Father   . Lymphoma Father 101       chemo  . Cancer Father        lymphoma  . Lymphoma Paternal Grandmother   . Diabetes Maternal Grandmother   . Colon cancer Neg Hx   . Rectal cancer Neg Hx   . Esophageal cancer Neg Hx     Social History   Socioeconomic History  . Marital status: Divorced    Spouse name: Not on file  . Number of children: 1  . Years of education: 1  . Highest education level: Bachelor's degree (e.g., BA, AB, BS)  Occupational History  . Occupation: Consulting civil engineer    Comment: Full time; Barista  Tobacco Use  . Smoking status: Never Smoker  . Smokeless tobacco: Never Used  Vaping Use  . Vaping Use: Never used  Substance and Sexual Activity  . Alcohol use: Yes    Alcohol/week: 2.0 standard drinks    Types: 1 Glasses of wine, 1 Shots of liquor per week    Comment: 08/11/2014 "0-2 glasses of wine or a mixed drink q wk"  . Drug use: No  . Sexual activity: Not Currently    Birth control/protection: Post-menopausal  Other Topics Concern  . Not on file  Social History Narrative   Marital status: divorced; not dating      Children: 1 daughter; no grandchildren      Lives:        Employment: Optometrist      Tobacco:       Alcohol:      Drugs:       Exercise:    Social Determinants of Radio broadcast assistant Strain: Low Risk   . Difficulty of Paying Living Expenses: Not hard at all  Food Insecurity: No  Food Insecurity  . Worried About Charity fundraiser in the Last Year: Never true  . Ran Out of Food in the Last Year: Never true  Transportation Needs: No Transportation Needs  .  Lack of Transportation (Medical): No  . Lack of Transportation (Non-Medical): No  Physical Activity: Inactive  . Days of Exercise per Week: 0 days  . Minutes of Exercise per Session: 0 min  Stress: No Stress Concern Present  . Feeling of Stress : Only a little  Social Connections: Moderately Isolated  . Frequency of Communication with Friends and Family: More than three times a week  . Frequency of Social Gatherings with Friends and Family: Never  . Attends Religious Services: More than 4 times per year  . Active Member of Clubs or Organizations: No  . Attends Archivist Meetings: Never  . Marital Status: Divorced  Human resources officer Violence: Not At Risk  . Fear of Current or Ex-Partner: No  . Emotionally Abused: No  . Physically Abused: No  . Sexually Abused: No    Outpatient Medications Prior to Visit  Medication Sig Dispense Refill  . acetaminophen (TYLENOL) 650 MG CR tablet Take 1,300 mg by mouth See admin instructions. Take 1,300 mg by mouth at bedtime and may also take an additional 1,300 mg one to two times a day as needed for pain    . budesonide-formoterol (SYMBICORT) 160-4.5 MCG/ACT inhaler Inhale 2 puffs into the lungs 2 (two) times daily. (Patient taking differently: Inhale 2 puffs into the lungs 2 (two) times daily. AM ONLY) 1 Inhaler 0  . Calcium Carb-Cholecalciferol (CALCIUM+D3 PO) Take 2 tablets by mouth daily.     Mariane Baumgarten Calcium (STOOL SOFTENER PO) Take 2 tablets by mouth in the morning and at bedtime.    . DULoxetine (CYMBALTA) 30 MG capsule 2 caps orally in AM and 1 in PM 270 capsule 1  . fesoterodine (TOVIAZ) 8 MG TB24 tablet Take 1 tablet (8 mg total) by mouth daily. (Patient taking differently: Take 8 mg by mouth every evening. ) 90 tablet 3  . loratadine (CLARITIN) 10 MG  tablet Take 1 tablet (10 mg total) by mouth daily. 90 tablet 3  . Multiple Vitamins-Minerals (ONE-A-DAY WOMENS 50 PLUS) TABS Take 1 tablet by mouth daily.    . simvastatin (ZOCOR) 40 MG tablet Take 1 tablet (40 mg total) by mouth at bedtime. 90 tablet 4  . Turmeric Curcumin 500 MG CAPS Take 1,500 mg by mouth daily.     Marland Kitchen albuterol (PROAIR HFA) 108 (90 Base) MCG/ACT inhaler Inhale 2 puffs into the lungs every 6 (six) hours as needed for wheezing or shortness of breath. 3 Inhaler 0  . atomoxetine (STRATTERA) 40 MG capsule Take 1 capsule (40 mg total) by mouth daily. Ok to increase to 2 caps daily after 2 weeks. 60 capsule 1  . clindamycin (CLEOCIN-T) 1 % external solution Apply topically 2 (two) times daily. To affected area/bumps 30 mL 0  . cyclobenzaprine (FLEXERIL) 10 MG tablet Take 1 tablet (10 mg total) by mouth at bedtime as needed (for sleep or pain). 30 tablet 0  . Glucos-Chond-Hyal Ac-Ca Fructo (MOVE FREE JOINT HEALTH ADVANCE) TABS Take 2 tablets by mouth daily. Move free joint health Advanced formula with MSM/Vit D3 glucosamine and Chondroitin (Patient not taking: Reported on 08/08/2019)    . lisdexamfetamine (VYVANSE) 30 MG capsule Take 1 capsule (30 mg total) by mouth daily. (Patient not taking: Reported on 08/08/2019) 30 capsule 0  . NON FORMULARY Take 1 tablet by mouth daily. Viviscal (Patient not taking: Reported on 08/08/2019)    . omeprazole (PRILOSEC) 40 MG capsule Take 1 capsule (40 mg total) by mouth daily. (Patient not taking: Reported on  08/22/2019) 30 capsule 3   No facility-administered medications prior to visit.    Allergies  Allergen Reactions  . Aspirin Other (See Comments)    GI Intolerance; History of ulcers; GI BLEED  . Bee Venom Swelling    Any insects that bite or stings- swelling at site of sting or cellulitis can develop  . Nsaids Other (See Comments)    History of ulcers  . Vesicare [Solifenacin] Itching    ROS Review of Systems    Objective:    Physical  Exam  BP (!) 111/60   Pulse 87   Ht 5\' 1"  (1.549 m)   Wt 187 lb (84.8 kg)   LMP 08/30/2008   SpO2 98%   BMI 35.33 kg/m  Wt Readings from Last 3 Encounters:  08/22/19 187 lb (84.8 kg)  07/07/19 186 lb (84.4 kg)  05/15/19 195 lb (88.5 kg)     There are no preventive care reminders to display for this patient.  There are no preventive care reminders to display for this patient.  Lab Results  Component Value Date   TSH 1.73 09/25/2018   Lab Results  Component Value Date   WBC 7.4 11/13/2017   HGB 13.7 11/13/2017   HCT 41.7 11/13/2017   MCV 83.4 11/13/2017   PLT 418 (H) 11/13/2017   Lab Results  Component Value Date   NA 142 09/25/2018   K 5.1 09/25/2018   CO2 27 09/25/2018   GLUCOSE 101 (H) 09/25/2018   BUN 17 09/25/2018   CREATININE 0.85 09/25/2018   BILITOT 0.3 07/18/2018   ALKPHOS 104 07/02/2017   AST 20 07/18/2018   ALT 20 07/18/2018   PROT 6.9 07/18/2018   ALBUMIN 4.3 07/02/2017   CALCIUM 9.7 09/25/2018   ANIONGAP 7 09/22/2017   GFR 74.97 03/19/2013   Lab Results  Component Value Date   CHOL 209 (H) 09/25/2018   Lab Results  Component Value Date   HDL 61 09/25/2018   Lab Results  Component Value Date   LDLCALC 123 (H) 09/25/2018   Lab Results  Component Value Date   TRIG 137 09/25/2018   Lab Results  Component Value Date   CHOLHDL 3.4 09/25/2018   Lab Results  Component Value Date   HGBA1C 6.0 (A) 07/07/2019      Assessment & Plan:   Problem List Items Addressed This Visit      Genitourinary   OAB (overactive bladder)    Actually planning on getting back in with urology she just has not been quite as satisfied with the results with the [place].  Though it has doubled her time to be able to hold her urine at night.  She says she really does not have a lot of frequency during the daytime.  We did review discussed avoiding those irritants to the bladder such as caffeine and alcohol etc.        Other   Primary insomnia    We  discussed strategies around try to get her sleep cycle back on track.  Moving the bedtime forward about 15 minutes every week or even every couple of weeks.  And really working on setting a very discrete bedtime and wake time.  And trying to really stick to that.  We may need to address the OCD separately.  She is currently on Cymbalta for more for chronic musculoskeletal pain.  But its not very effective for OCD.  But then we would potentially have to take her off the Cymbalta if we  wanted to treat for the OCD which makes it a little bit more complicated.      Obsessional thoughts   Chronic musculoskeletal pain   Relevant Medications   cyclobenzaprine (FLEXERIL) 10 MG tablet   ADD (attention deficit disorder) - Primary    She wants to finish out the Strattera that she has for the remaining week.  And then she still has some of the amphetamine salts and says we will go back to trying those and let me know which she would like to do.  For now I am just going to remove the Strattera from her medication list.  Again the Vyvanse was not covered by insurance         Meds ordered this encounter  Medications  . cyclobenzaprine (FLEXERIL) 10 MG tablet    Sig: Take 1 tablet (10 mg total) by mouth at bedtime as needed (for sleep or pain).    Dispense:  30 tablet    Refill:  3    Follow-up: Return if symptoms worsen or fail to improve.    Beatrice Lecher, MD

## 2019-08-22 NOTE — Assessment & Plan Note (Signed)
Actually planning on getting back in with urology she just has not been quite as satisfied with the results with the [place].  Though it has doubled her time to be able to hold her urine at night.  She says she really does not have a lot of frequency during the daytime.  We did review discussed avoiding those irritants to the bladder such as caffeine and alcohol etc.

## 2019-08-22 NOTE — Assessment & Plan Note (Signed)
We discussed strategies around try to get her sleep cycle back on track.  Moving the bedtime forward about 15 minutes every week or even every couple of weeks.  And really working on setting a very discrete bedtime and wake time.  And trying to really stick to that.  We may need to address the OCD separately.  She is currently on Cymbalta for more for chronic musculoskeletal pain.  But its not very effective for OCD.  But then we would potentially have to take her off the Cymbalta if we wanted to treat for the OCD which makes it a little bit more complicated.

## 2019-09-17 DIAGNOSIS — M5136 Other intervertebral disc degeneration, lumbar region: Secondary | ICD-10-CM | POA: Diagnosis not present

## 2019-09-17 DIAGNOSIS — M4310 Spondylolisthesis, site unspecified: Secondary | ICD-10-CM | POA: Diagnosis not present

## 2019-09-23 DIAGNOSIS — M545 Low back pain: Secondary | ICD-10-CM | POA: Diagnosis not present

## 2019-09-24 ENCOUNTER — Other Ambulatory Visit: Payer: Self-pay | Admitting: *Deleted

## 2019-09-24 DIAGNOSIS — M7918 Myalgia, other site: Secondary | ICD-10-CM

## 2019-09-24 MED ORDER — BUDESONIDE-FORMOTEROL FUMARATE 160-4.5 MCG/ACT IN AERO: 2.0000 | INHALATION_SPRAY | Freq: Every day | RESPIRATORY_TRACT | 1 refills | Status: DC

## 2019-09-24 MED ORDER — CYCLOBENZAPRINE HCL 10 MG PO TABS: 10.0000 mg | ORAL_TABLET | Freq: Every evening | ORAL | 1 refills | Status: DC | PRN

## 2019-09-25 ENCOUNTER — Encounter: Payer: Self-pay | Admitting: Family Medicine

## 2019-09-25 ENCOUNTER — Telehealth: Payer: Self-pay | Admitting: Family Medicine

## 2019-09-25 ENCOUNTER — Encounter: Payer: Self-pay | Admitting: Nurse Practitioner

## 2019-09-25 DIAGNOSIS — Z13220 Encounter for screening for lipoid disorders: Secondary | ICD-10-CM

## 2019-09-25 DIAGNOSIS — E785 Hyperlipidemia, unspecified: Secondary | ICD-10-CM

## 2019-09-25 DIAGNOSIS — Z0000 Encounter for general adult medical examination without abnormal findings: Secondary | ICD-10-CM

## 2019-09-25 DIAGNOSIS — R7301 Impaired fasting glucose: Secondary | ICD-10-CM

## 2019-09-25 DIAGNOSIS — Z1329 Encounter for screening for other suspected endocrine disorder: Secondary | ICD-10-CM

## 2019-09-25 DIAGNOSIS — I10 Essential (primary) hypertension: Secondary | ICD-10-CM

## 2019-09-25 NOTE — Telephone Encounter (Signed)
Labs pended that patient had last year. Please review.

## 2019-09-25 NOTE — Telephone Encounter (Signed)
Patient has requested that her lab orders for her yearly regular physical be sent to lab so she can go by there before her September Physical with Dr. Madilyn Fireman

## 2019-09-26 NOTE — Telephone Encounter (Signed)
Patient had TSH, lipid, cmp, and CBC just done. I dont know of anything additional?

## 2019-09-29 ENCOUNTER — Encounter: Payer: Self-pay | Admitting: Nurse Practitioner

## 2019-09-29 ENCOUNTER — Telehealth (INDEPENDENT_AMBULATORY_CARE_PROVIDER_SITE_OTHER): Payer: Medicare HMO | Admitting: Nurse Practitioner

## 2019-09-29 ENCOUNTER — Ambulatory Visit: Payer: Medicare HMO

## 2019-09-29 DIAGNOSIS — Z23 Encounter for immunization: Secondary | ICD-10-CM

## 2019-09-29 DIAGNOSIS — Z0000 Encounter for general adult medical examination without abnormal findings: Secondary | ICD-10-CM

## 2019-09-29 NOTE — Telephone Encounter (Signed)
Yes, looks like they were ordered on the 26 she just needs to go and have them drawn.

## 2019-09-29 NOTE — Progress Notes (Signed)
VIRTUAL VISIT DUE TO COVID-19 PRECAUTIONS VIRTUAL VIDEO VISIT PERFORMED  Subjective:   [name] is a 69 y.o. female who presents for Medicare Annual (Subsequent) preventive examination.  Review of Systems    Denies shortness of breath, chest pain, cough Endorses balance issues  Denies vision changes, headaches, dizziness Endorses back pain       Objective:    Today's Vitals   09/29/19 1400 09/29/19 1406  Temp: 99 F (37.2 C)   TempSrc: Oral   Weight: 181 lb (82.1 kg)   Height: 5\' 2"  (1.575 m)   PainSc:  2    Body mass index is 33.11 kg/m.  Advanced Directives 09/29/2019 09/25/2018 10/08/2017 09/21/2017 09/10/2017 08/12/2014 08/11/2014  Does Patient Have a Medical Advance Directive? No No Yes Yes No No No  Type of Advance Directive - Programmer, multimedia of Attorney - - -  Does patient want to make changes to medical advance directive? - - - Yes (Inpatient - patient requests chaplain consult to change a medical advance directive) - - -  Copy of [place] in Chart? - - Yes Yes - - -  Would patient like information on creating a medical advance directive? No - Patient declined Yes (MAU/Ambulatory/Procedural Areas - Information given) Yes (Inpatient - patient requests chaplain consult to create a medical advance directive) Yes (Inpatient - patient requests chaplain consult to create a medical advance directive) No - Patient declined Yes - Educational materials given;No - patient declined information Yes - Spiritual care consult ordered    Current Medications (verified) Outpatient Encounter Medications as of 09/29/2019  Medication Sig  . acetaminophen (TYLENOL) 650 MG CR tablet Take 1,300 mg by mouth See admin instructions. Take 1,300 mg by mouth at bedtime and may also take an additional 1,300 mg one to two times a day as needed for pain  . budesonide-formoterol (SYMBICORT) 160-4.5 MCG/ACT inhaler Inhale 2 puffs into the lungs daily.  .  cyclobenzaprine (FLEXERIL) 10 MG tablet Take 1 tablet (10 mg total) by mouth at bedtime as needed (for sleep or pain).  . DULoxetine (CYMBALTA) 30 MG capsule 2 caps orally in AM and 1 in PM  . loratadine (CLARITIN) 10 MG tablet Take 1 tablet (10 mg total) by mouth daily.  . Multiple Vitamins-Minerals (ONE-A-DAY WOMENS 50 PLUS) TABS Take 1 tablet by mouth daily.  . simvastatin (ZOCOR) 40 MG tablet Take 1 tablet (40 mg total) by mouth at bedtime.  . Turmeric Curcumin 500 MG CAPS Take 1,500 mg by mouth daily.   . [DISCONTINUED] Calcium Carb-Cholecalciferol (CALCIUM+D3 PO) Take 2 tablets by mouth daily.  (Patient not taking: Reported on 09/29/2019)  . [DISCONTINUED] Docusate Calcium (STOOL SOFTENER PO) Take 2 tablets by mouth in the morning and at bedtime. (Patient not taking: Reported on 09/29/2019)  . [DISCONTINUED] fesoterodine (TOVIAZ) 8 MG TB24 tablet Take 1 tablet (8 mg total) by mouth daily. (Patient not taking: Reported on 09/29/2019)   No facility-administered encounter medications on file as of 09/29/2019.    Allergies (verified) Aspirin, Bee venom, Nsaids, and Vesicare [solifenacin]   History: Past Medical History:  Diagnosis Date  . ADD (attention deficit disorder)   . Arthritis    "bunion on  right" (08/11/2014)  . Chronic bronchitis ([place])    "plenty; but not q yr" (08/11/2014)  . Depression   . Dyslipidemia   . Environmental allergies   . GERD (gastroesophageal reflux disease)   . Hearing difficulty of left ear    "  grew up w/deaf parent; find myself lip reading more recently" (08/11/2014)  . Hepatitis    "exposed in college; rec'd gamma globulin; 6 months later S/S (weakness, lethargy, fevers); ; dx'd infectious hepatitis"  . History of herpes genitalis    "stress induced reoccurance that shows up on my left middle finger" (08/11/2014)  . History of hiatal hernia   . History of stomach ulcers   . Hypercholesterolemia   . Hyperthyroidism 1994   "postpartum only; resolved  itself"  . Incisional hernia   . Internal hemorrhoids   . Leg cramping   . Osteopenia   . Seasonal asthma   . Stress incontinence, female   . Ulcer    Past Surgical History:  Procedure Laterality Date  . CESAREAN SECTION  1994  . CHALAZION EXCISION Left ~ 2013  . DILATION AND CURETTAGE OF UTERUS  X 2  . EYE SURGERY    . HERNIA REPAIR  08/11/2014  . INCISIONAL HERNIA REPAIR N/A 08/11/2014   Procedure: LAPAROSCOPIC INCISIONAL HERNIA REPAIR;  Surgeon: Coralie Keens, MD;  Location: [facility];  Service: General;  Laterality: N/A;  . INCONTINENCE SURGERY  2005  . INGUINAL HERNIA REPAIR Left 08/11/2014  . INGUINAL HERNIA REPAIR Left 08/11/2014   Procedure: LEFT INGUINAL HERNIA REPAIR;  Surgeon: Coralie Keens, MD;  Location: [facility];  Service: General;  Laterality: Left;  . INSERTION OF MESH Left 08/11/2014   Procedure: INSERTION OF MESH TO LEFT GROIN AND ABDOMEN;  Surgeon: Coralie Keens, MD;  Location: [facility];  Service: General;  Laterality: Left;  . LAPAROSCOPIC INCISIONAL / UMBILICAL / VENTRAL HERNIA REPAIR  08/11/2014   [facility]  . NASAL POLYP EXCISION  2014  . REPAIR OF PERFORATED ULCER  1985  . TONSILLECTOMY    . TOTAL SHOULDER ARTHROPLASTY Left 09/21/2017   Procedure: LEFT TOTAL SHOULDER ARTHROPLASTY;  Surgeon: Nicholes Stairs, MD;  Location: [facility];  Service: Orthopedics;  Laterality: Left;   Family History  Problem Relation Age of Onset  . Arthritis Mother   . Heart disease Mother        CABG @ 66  . Diabetes Mother   . Osteoporosis Mother   . Hyperlipidemia Mother   . Arthritis Father   . Lymphoma Father 17       chemo  . Cancer Father        lymphoma  . Lymphoma Paternal Grandmother   . Diabetes Maternal Grandmother   . Colon cancer Neg Hx   . Rectal cancer Neg Hx   . Esophageal cancer Neg Hx    Social History   Socioeconomic History  . Marital status: Divorced    Spouse name: Not on file  . Number of children: 1  . Years of education: 75  . Highest education  level: Bachelor's degree (e.g., BA, AB, BS)  Occupational History  . Occupation: Consulting civil engineer    Comment: Full time; Barista  Tobacco Use  . Smoking status: Former Smoker    Packs/day: 0.00    Types: Cigarettes  . Smokeless tobacco: Never Used  . Tobacco comment: Social Smoker in her 23's  Vaping Use  . Vaping Use: Never used  Substance and Sexual Activity  . Alcohol use: Yes    Alcohol/week: 2.0 - 4.0 standard drinks    Types: 2 - 4 Standard drinks or equivalent per week  . Drug use: No  . Sexual activity: Not Currently    Birth control/protection: Post-menopausal  Other Topics Concern  . Not on  file  Social History Narrative   Marital status: divorced; not dating      Children: 1 daughter; no grandchildren      Lives:  In senior living apartment      Employment: Optometrist      Tobacco: used only socially in [place] 20's- never a regular user      Alcohol: 3-4 drinks a week- spread out      Drugs: never      Exercise: stretching exercises every day 15-46min- no vigorous activity   Social Determinants of Health   Financial Resource Strain: Low Risk   . Difficulty of Paying Living Expenses: Not hard at all  Food Insecurity: No Food Insecurity  . Worried About Charity fundraiser in the Last Year: Never true  . Ran Out of Food in the Last Year: Never true  Transportation Needs: No Transportation Needs  . Lack of Transportation (Medical): No  . Lack of Transportation (Non-Medical): No  Physical Activity: Inactive  . Days of Exercise per Week: 0 days  . Minutes of Exercise per Session: 0 min  Stress: No Stress Concern Present  . Feeling of Stress : Not at all  Social Connections: Moderately Integrated  . Frequency of Communication with Friends and Family: More than three times a week  . Frequency of Social Gatherings with Friends and Family: More than three times a week  . Attends Religious Services: More than 4 times per year  . Active  Member of Clubs or Organizations: Yes  . Attends Archivist Meetings: More than 4 times per year  . Marital Status: Divorced    Tobacco Counseling Counseling given: No Comment: Social Smoker in her 72's   Clinical Intake:  Pre-visit preparation completed: Yes  Pain : 0-10 Pain Score: 2  Pain Type: Chronic pain Pain Location: Back Pain Onset: More than a month ago Pain Frequency: Intermittent     Nutritional Risks: None Diabetes: No  How often do you need to have someone help you when you read instructions, pamphlets, or other written materials from your doctor or pharmacy?: 1 - Never What is the last grade level you completed in school?: college degree  Diabetic?No  Interpreter Needed?: No  Information entered by :: KS,CMA and SE, NP   Activities of Daily Living In your present state of health, do you have any difficulty performing the following activities: 09/29/2019  Hearing? N  Vision? N  Difficulty concentrating or making decisions? N  Walking or climbing stairs? Y  Dressing or bathing? N  Doing errands, shopping? N  Preparing Food and eating ? N  Using the Toilet? N  In the past six months, have you accidently leaked urine? Y  Do you have problems with loss of bowel control? N  Managing your Medications? N  Managing your Finances? N  Housekeeping or managing your Housekeeping? N  Some recent data might be hidden    Patient Care Team: Hali Marry, MD as PCP - General (Family Medicine) Woodroe Mode, MD (Obstetrics and Gynecology) Thornell Sartorius, MD (Otolaryngology) Nicholaus Bloom, MD (Psychiatry)  Indicate any recent Medical Services you may have received from other than [facility] providers in the past year (date may be approximate).     Assessment:   This is a routine wellness examination for Tyashia.  Hearing/Vision screen No exam data present  Dietary issues and exercise activities discussed: Current Exercise Habits: Home  exercise routine, Type of exercise: stretching, Time (Minutes): 20,  Frequency (Times/Week): 7, Weekly Exercise (Minutes/Week): 140, Intensity: Mild, Exercise limited by: orthopedic condition(s)  Goals    . Patient Stated     Patient states would like to start exercising and eating better      Depression Screen PHQ 2/9 Scores 09/29/2019 04/17/2019 04/17/2019 04/17/2019 10/04/2018 09/25/2018 06/21/2018  PHQ - 2 Score 2 0 0 0 0 0 0  PHQ- 9 Score 8 5 - - 9 - -    Fall Risk Fall Risk  09/29/2019 10/03/2018 09/25/2018 07/02/2017 05/28/2017  Falls in the past year? 1 1 1  No No  Number falls in past yr: 1 0 0 - -  Injury with Fall? 0 0 0 - -  Risk for fall due to : - Impaired balance/gait Impaired balance/gait - -  Follow up Falls evaluation completed Falls prevention discussed Falls prevention discussed - -    Any stairs in or around the home? No  Home free of loose throw rugs in walkways, pet beds, electrical cords, etc? Yes  Adequate lighting in your home to reduce risk of falls? Yes   ASSISTIVE DEVICES UTILIZED TO PREVENT FALLS:  Life alert? No  Use of a cane, walker or w/c? No  Grab bars in the bathroom? Yes  Shower chair or bench in shower? Yes  Elevated toilet seat or a handicapped toilet? Yes   TIMED UP AND GO:  Was the test performed? No . Limited by Virtual Visit  Cognitive Function:     6CIT Screen 09/29/2019 09/25/2018  What Year? 0 points 0 points  What month? 0 points 0 points  What time? 0 points 0 points  Count back from 20 0 points 0 points  Months in reverse 0 points 0 points  Repeat phrase 0 points 0 points  Total Score 0 0    Immunizations Immunization History  Administered Date(s) Administered  . Fluad Quad(high Dose 65+) 10/04/2018  . Influenza Inj Mdck Quad With Preservative 10/24/2016  . Influenza Split 12/01/2010  . Influenza, High Dose Seasonal PF 10/02/2017  . Influenza,inj,Quad PF,6+ Mos 11/29/2015  . Influenza-Unspecified 09/30/2012, 10/08/2013,  10/31/2014  . PFIZER SARS-COV-2 Vaccination 02/25/2019, 03/27/2019  . Pneumococcal Conjugate-13 11/29/2015  . Pneumococcal Polysaccharide-23 05/28/2017  . Tdap 12/30/2009  . Zoster 09/30/2012  . Zoster Recombinat (Shingrix) 11/13/2017, 10/09/2018    TDAP status: Up to date - Due 01/05/2020 Patient informed Flu Vaccine status: Up to date - Due at next in office visit- Patient informed Pneumococcal vaccine status: Up to date Covid-19 vaccine status: Completed vaccines  Qualifies for Shingles Vaccine? Yes   Zostavax completed Yes   Shingrix Completed?: Yes  Screening Tests Health Maintenance  Topic Date Due  . INFLUENZA VACCINE  12/30/2019 (Originally 08/31/2019)  . TETANUS/TDAP  12/31/2019  . MAMMOGRAM  10/08/2020  . COLONOSCOPY  01/22/2028  . DEXA SCAN  Completed  . COVID-19 Vaccine  Completed  . Hepatitis C Screening  Completed  . PNA vac Low Risk Adult  Completed    Health Maintenance  There are no preventive care reminders to display for this patient.  Colorectal cancer screening: Completed 12/2018. Repeat every 10 years Mammogram status: Completed This year. Repeat every year Bone Density status: Completed 08/07/2017. Results reflect: Bone density results: NORMAL. Repeat every 10 years.  Lung Cancer Screening: (Low Dose CT Chest recommended if Age 77-80 years, 30 pack-year currently smoking OR have quit w/in 15years.) does not qualify.   Lung Cancer Screening Referral: NA  Additional Screening:  Hepatitis C Screening: does not qualify;  Completed 12/17/2014  Vision Screening: Recommended annual ophthalmology exams for Zackaria Burkey detection of glaucoma and other disorders of the eye. Is the patient up to date with their annual eye exam?  Yes  Who is the provider or what is the name of the office in which the patient attends annual eye exams? Dr. Katy Fitch If pt is not established with a provider, would they like to be referred to a provider to establish care? NA.   Dental  Screening: Recommended annual dental exams for proper oral hygiene- Every 4-6 month exams.   Community Resource Referral / Chronic Care Management: CRR required this visit?  No   CCM required this visit?  No      Plan:     I have personally reviewed and noted the following in the patient's chart:   . Medical and social history . Use of alcohol, tobacco or illicit drugs  . Current medications and supplements . Functional ability and status . Nutritional status . Physical activity . Advanced directives . List of other physicians . Hospitalizations, surgeries, and ER visits in previous 12 months . Vitals . Screenings to include cognitive, depression, and falls . Referrals and appointments  In addition, I have reviewed and discussed with patient certain preventive protocols, quality metrics, and best practice recommendations. A written personalized care plan for preventive services as well as general preventive health recommendations were provided to patient.     Orma Render, NP   09/29/2019

## 2019-09-29 NOTE — Telephone Encounter (Signed)
Patient had appt today with Caryl Asp

## 2019-09-29 NOTE — Patient Instructions (Addendum)
 Fall Prevention in the Home, Adult Falls can cause injuries. They can happen to people of all ages. There are many things you can do to make your home safe and to help prevent falls. Ask for help when making these changes, if needed. What actions can I take to prevent falls? General Instructions  Use good lighting in all rooms. Replace any light bulbs that burn out.  Turn on the lights when you go into a dark area. Use night-lights.  Keep items that you use often in easy-to-reach places. Lower the shelves around your home if necessary.  Set up your furniture so you have a clear path. Avoid moving your furniture around.  Do not have throw rugs and other things on the floor that can make you trip.  Avoid walking on wet floors.  If any of your floors are uneven, fix them.  Add color or contrast paint or tape to clearly mark and help you see: ? Any grab bars or handrails. ? First and last steps of stairways. ? Where the edge of each step is.  If you use a stepladder: ? Make sure that it is fully opened. Do not climb a closed stepladder. ? Make sure that both sides of the stepladder are locked into place. ? Ask someone to hold the stepladder for you while you use it.  If there are any pets around you, be aware of where they are. What can I do in the bathroom?      Keep the floor dry. Clean up any water that spills onto the floor as soon as it happens.  Remove soap buildup in the tub or shower regularly.  Use non-skid mats or decals on the floor of the tub or shower.  Attach bath mats securely with double-sided, non-slip rug tape.  If you need to sit down in the shower, use a plastic, non-slip stool.  Install grab bars by the toilet and in the tub and shower. Do not use towel bars as grab bars. What can I do in the bedroom?  Make sure that you have a light by your bed that is easy to reach.  Do not use any sheets or blankets that are too big for your bed. They should  not hang down onto the floor.  Have a firm chair that has side arms. You can use this for support while you get dressed. What can I do in the kitchen?  Clean up any spills right away.  If you need to reach something above you, use a strong step stool that has a grab bar.  Keep electrical cords out of the way.  Do not use floor polish or wax that makes floors slippery. If you must use wax, use non-skid floor wax. What can I do with my stairs?  Do not leave any items on the stairs.  Make sure that you have a light switch at the top of the stairs and the bottom of the stairs. If you do not have them, ask someone to add them for you.  Make sure that there are handrails on both sides of the stairs, and use them. Fix handrails that are broken or loose. Make sure that handrails are as long as the stairways.  Install non-slip stair treads on all stairs in your home.  Avoid having throw rugs at the top or bottom of the stairs. If you do have throw rugs, attach them to the floor with carpet tape.  Choose a carpet that   does not hide the edge of the steps on the stairway.  Check any carpeting to make sure that it is firmly attached to the stairs. Fix any carpet that is loose or worn. What can I do on the outside of my home?  Use bright outdoor lighting.  Regularly fix the edges of walkways and driveways and fix any cracks.  Remove anything that might make you trip as you walk through a door, such as a raised step or threshold.  Trim any bushes or trees on the path to your home.  Regularly check to see if handrails are loose or broken. Make sure that both sides of any steps have handrails.  Install guardrails along the edges of any raised decks and porches.  Clear walking paths of anything that might make someone trip, such as tools or rocks.  Have any leaves, snow, or ice cleared regularly.  Use sand or salt on walking paths during winter.  Clean up any spills in your garage right  away. This includes grease or oil spills. What other actions can I take?  Wear shoes that: ? Have a low heel. Do not wear high heels. ? Have rubber bottoms. ? Are comfortable and fit you well. ? Are closed at the toe. Do not wear open-toe sandals.  Use tools that help you move around (mobility aids) if they are needed. These include: ? Canes. ? Walkers. ? Scooters. ? Crutches.  Review your medicines with your doctor. Some medicines can make you feel dizzy. This can increase your chance of falling. Ask your doctor what other things you can do to help prevent falls. Where to find more information  Centers for Disease Control and Prevention, [employer]: https://cdc.gov  National Institute on Aging: https://go4life.nia.nih.gov Contact a doctor if:  You are afraid of falling at home.  You feel weak, drowsy, or dizzy at home.  You fall at home. Summary  There are many simple things that you can do to make your home safe and to help prevent falls.  Ways to make your home safe include removing tripping hazards and installing grab bars in the bathroom.  Ask for help when making these changes in your home. This information is not intended to replace advice given to you by your health care provider. Make sure you discuss any questions you have with your health care provider. Document Revised: 05/09/2018 Document Reviewed: 08/31/2016 Elsevier Patient Education  2020 Elsevier Inc.   Health Maintenance, Female Adopting a healthy lifestyle and getting preventive care are important in promoting health and wellness. Ask your health care provider about:  The right schedule for you to have regular tests and exams.  Things you can do on your own to prevent diseases and keep yourself healthy. What should I know about diet, weight, and exercise? Eat a healthy diet   Eat a diet that includes plenty of vegetables, fruits, low-fat dairy products, and lean protein.  Do not eat a lot of foods  that are high in solid fats, added sugars, or sodium. Maintain a healthy weight Body mass index (BMI) is used to identify weight problems. It estimates body fat based on height and weight. Your health care provider can help determine your BMI and help you achieve or maintain a healthy weight. Get regular exercise Get regular exercise. This is one of the most important things you can do for your health. Most adults should:  Exercise for at least 150 minutes each week. The exercise should increase your heart rate   and make you sweat (moderate-intensity exercise).  Do strengthening exercises at least twice a week. This is in addition to the moderate-intensity exercise.  Spend less time sitting. Even light physical activity can be beneficial. Watch cholesterol and blood lipids Have your blood tested for lipids and cholesterol at 69 years of age, then have this test every 5 years. Have your cholesterol levels checked more often if:  Your lipid or cholesterol levels are high.  You are older than 69 years of age.  You are at high risk for heart disease. What should I know about cancer screening? Depending on your health history and family history, you may need to have cancer screening at various ages. This may include screening for:  Breast cancer.  Cervical cancer.  Colorectal cancer.  Skin cancer.  Lung cancer. What should I know about heart disease, diabetes, and high blood pressure? Blood pressure and heart disease  High blood pressure causes heart disease and increases the risk of stroke. This is more likely to develop in people who have high blood pressure readings, are of African descent, or are overweight.  Have your blood pressure checked: ? Every 3-5 years if you are 18-39 years of age. ? Every year if you are 40 years old or older. Diabetes Have regular diabetes screenings. This checks your fasting blood sugar level. Have the screening done:  Once every three years after  age 40 if you are at a normal weight and have a low risk for diabetes.  More often and at a younger age if you are overweight or have a high risk for diabetes. What should I know about preventing infection? Hepatitis B If you have a higher risk for hepatitis B, you should be screened for this virus. Talk with your health care provider to find out if you are at risk for hepatitis B infection. Hepatitis C Testing is recommended for:  Everyone born from 1945 through 1965.  Anyone with known risk factors for hepatitis C. Sexually transmitted infections (STIs)  Get screened for STIs, including gonorrhea and chlamydia, if: ? You are sexually active and are younger than 69 years of age. ? You are older than 69 years of age and your health care provider tells you that you are at risk for this type of infection. ? Your sexual activity has changed since you were last screened, and you are at increased risk for chlamydia or gonorrhea. Ask your health care provider if you are at risk.  Ask your health care provider about whether you are at high risk for HIV. Your health care provider may recommend a prescription medicine to help prevent HIV infection. If you choose to take medicine to prevent HIV, you should first get tested for HIV. You should then be tested every 3 months for as long as you are taking the medicine. Pregnancy  If you are about to stop having your period (premenopausal) and you may become pregnant, seek counseling before you get pregnant.  Take 400 to 800 micrograms (mcg) of folic acid every day if you become pregnant.  Ask for birth control (contraception) if you want to prevent pregnancy. Osteoporosis and menopause Osteoporosis is a disease in which the bones lose minerals and strength with aging. This can result in bone fractures. If you are 65 years old or older, or if you are at risk for osteoporosis and fractures, ask your health care provider if you should:  Be screened for  bone loss.  Take a calcium or vitamin   D supplement to lower your risk of fractures.  Be given hormone replacement therapy (HRT) to treat symptoms of menopause. Follow these instructions at home: Lifestyle  Do not use any products that contain nicotine or tobacco, such as cigarettes, e-cigarettes, and chewing tobacco. If you need help quitting, ask your health care provider.  Do not use street drugs.  Do not share needles.  Ask your health care provider for help if you need support or information about quitting drugs. Alcohol use  Do not drink alcohol if: ? Your health care provider tells you not to drink. ? You are pregnant, may be pregnant, or are planning to become pregnant.  If you drink alcohol: ? Limit how much you use to 0-1 drink a day. ? Limit intake if you are breastfeeding.  Be aware of how much alcohol is in your drink. In the [place] one drink equals one 12 oz bottle of beer (355 mL), one 5 oz glass of wine (148 mL), or one 1 oz glass of hard liquor (44 mL). General instructions  Schedule regular health, dental, and eye exams.  Stay current with your vaccines.  Tell your health care provider if: ? You often feel depressed. ? You have ever been abused or do not feel safe at home. Summary  Adopting a healthy lifestyle and getting preventive care are important in promoting health and wellness.  Follow your health care provider's instructions about healthy diet, exercising, and getting tested or screened for diseases.  Follow your health care provider's instructions on monitoring your cholesterol and blood pressure. This information is not intended to replace advice given to you by your health care provider. Make sure you discuss any questions you have with your health care provider. Document Revised: 01/09/2018 Document Reviewed: 01/09/2018 Elsevier Patient Education  2020 Elsevier Inc.  

## 2019-10-08 DIAGNOSIS — M545 Low back pain: Secondary | ICD-10-CM | POA: Diagnosis not present

## 2019-10-10 DIAGNOSIS — M545 Low back pain: Secondary | ICD-10-CM | POA: Diagnosis not present

## 2019-10-13 ENCOUNTER — Encounter: Payer: Self-pay | Admitting: Family Medicine

## 2019-10-14 ENCOUNTER — Other Ambulatory Visit: Payer: Self-pay | Admitting: *Deleted

## 2019-10-14 DIAGNOSIS — Z1231 Encounter for screening mammogram for malignant neoplasm of breast: Secondary | ICD-10-CM

## 2019-10-14 DIAGNOSIS — M8580 Other specified disorders of bone density and structure, unspecified site: Secondary | ICD-10-CM

## 2019-10-15 ENCOUNTER — Other Ambulatory Visit: Payer: Self-pay | Admitting: Family Medicine

## 2019-10-15 DIAGNOSIS — M545 Low back pain: Secondary | ICD-10-CM | POA: Diagnosis not present

## 2019-10-16 NOTE — Telephone Encounter (Signed)
Per pt, she was under the impression that [facility] was sending her amphetamine rx. When pt called [employer], she was informed no new rx was on file. She said that she had been waiting a week. Pt will be out of her med tomorrow. Requesting if provider can send in a 90 day rx to [facility] and a short supply to [facility] since she will be out of medication. Please advise, thanks.

## 2019-10-17 MED ORDER — AMPHETAMINE-DEXTROAMPHETAMINE 20 MG PO TABS
20.0000 mg | ORAL_TABLET | Freq: Two times a day (BID) | ORAL | 0 refills | Status: DC | PRN
Start: 2019-10-17 — End: 2020-03-05

## 2019-10-17 NOTE — Telephone Encounter (Signed)
I will go ahead and refill medication I do not know that I actually saw the note from August where she was requesting the refill to be sent.  In the note she says she wants to go up to 3 times a day.  I would not recommend night.  I think it is too much.  Go ahead and send over short supply to local pharmacy as well.

## 2019-10-20 DIAGNOSIS — N3941 Urge incontinence: Secondary | ICD-10-CM | POA: Diagnosis not present

## 2019-10-20 DIAGNOSIS — N311 Reflex neuropathic bladder, not elsewhere classified: Secondary | ICD-10-CM | POA: Diagnosis not present

## 2019-10-20 DIAGNOSIS — M545 Low back pain: Secondary | ICD-10-CM | POA: Diagnosis not present

## 2019-10-20 DIAGNOSIS — R351 Nocturia: Secondary | ICD-10-CM | POA: Diagnosis not present

## 2019-10-21 DIAGNOSIS — I10 Essential (primary) hypertension: Secondary | ICD-10-CM | POA: Diagnosis not present

## 2019-10-21 DIAGNOSIS — R7301 Impaired fasting glucose: Secondary | ICD-10-CM | POA: Diagnosis not present

## 2019-10-21 DIAGNOSIS — E785 Hyperlipidemia, unspecified: Secondary | ICD-10-CM | POA: Diagnosis not present

## 2019-10-21 DIAGNOSIS — Z0000 Encounter for general adult medical examination without abnormal findings: Secondary | ICD-10-CM | POA: Diagnosis not present

## 2019-10-21 DIAGNOSIS — E611 Iron deficiency: Secondary | ICD-10-CM | POA: Diagnosis not present

## 2019-10-22 ENCOUNTER — Encounter: Payer: Self-pay | Admitting: Family Medicine

## 2019-10-22 ENCOUNTER — Ambulatory Visit (INDEPENDENT_AMBULATORY_CARE_PROVIDER_SITE_OTHER): Payer: Medicare HMO | Admitting: Family Medicine

## 2019-10-22 ENCOUNTER — Other Ambulatory Visit: Payer: Self-pay

## 2019-10-22 ENCOUNTER — Encounter: Payer: Medicare HMO | Admitting: Family Medicine

## 2019-10-22 VITALS — BP 134/82 | HR 92 | Temp 99.1°F | Wt 181.0 lb

## 2019-10-22 DIAGNOSIS — Z23 Encounter for immunization: Secondary | ICD-10-CM

## 2019-10-22 DIAGNOSIS — Z0000 Encounter for general adult medical examination without abnormal findings: Secondary | ICD-10-CM | POA: Diagnosis not present

## 2019-10-22 DIAGNOSIS — E785 Hyperlipidemia, unspecified: Secondary | ICD-10-CM | POA: Diagnosis not present

## 2019-10-22 DIAGNOSIS — M7702 Medial epicondylitis, left elbow: Secondary | ICD-10-CM

## 2019-10-22 NOTE — Assessment & Plan Note (Signed)
We also discussed that her LDL is not at goal.  Continue to work on healthy diet and regular exercise and weight loss.  In addition we will consider switching simvastatin to Livalo.  She got muscle aches in her thighs bilaterally on atorvastatin.  I do not want to increase the simvastatin to 80 mg because it can also increase risk of muscle cramping at that dose.  But she wants to finish out some of the simvastatin that she currently has before starting a new one.

## 2019-10-22 NOTE — Progress Notes (Signed)
Subjective:     [name] is a 69 y.o. female and is here for a comprehensive physical exam. The patient reports problems - urinary frequency, Left medial elbow pain, varicose veins and ankle swelling.  No active exercise routine.  She does feel like she is been overeating and wants to join a support group to help with this.  Social History   Socioeconomic History  . Marital status: Divorced    Spouse name: Not on file  . Number of children: 1  . Years of education: 69  . Highest education level: Bachelor's degree (e.g., BA, AB, BS)  Occupational History  . Occupation: Consulting civil engineer    Comment: Full time; Barista  Tobacco Use  . Smoking status: Former Smoker    Packs/day: 0.00    Types: Cigarettes  . Smokeless tobacco: Never Used  . Tobacco comment: Social Smoker in her 3's  Vaping Use  . Vaping Use: Never used  Substance and Sexual Activity  . Alcohol use: Yes    Alcohol/week: 2.0 - 4.0 standard drinks    Types: 2 - 4 Standard drinks or equivalent per week  . Drug use: No  . Sexual activity: Not Currently    Birth control/protection: Post-menopausal  Other Topics Concern  . Not on file  Social History Narrative   Marital status: divorced; not dating      Children: 1 daughter; no grandchildren      Lives:  In senior living apartment      Employment: Optometrist      Tobacco: used only socially in early 20's- never a regular user      Alcohol: 3-4 drinks a week- spread out      Drugs: never      Exercise: stretching exercises every day 15-69min- no vigorous activity   Social Determinants of Health   Financial Resource Strain: Low Risk   . Difficulty of Paying Living Expenses: Not hard at all  Food Insecurity: No Food Insecurity  . Worried About Charity fundraiser in the Last Year: Never true  . Ran Out of Food in the Last Year: Never true  Transportation Needs: No Transportation Needs  . Lack of Transportation (Medical): No  .  Lack of Transportation (Non-Medical): No  Physical Activity: Inactive  . Days of Exercise per Week: 0 days  . Minutes of Exercise per Session: 0 min  Stress: No Stress Concern Present  . Feeling of Stress : Not at all  Social Connections: Moderately Integrated  . Frequency of Communication with Friends and Family: More than three times a week  . Frequency of Social Gatherings with Friends and Family: More than three times a week  . Attends Religious Services: More than 4 times per year  . Active Member of Clubs or Organizations: Yes  . Attends Archivist Meetings: More than 4 times per year  . Marital Status: Divorced  Human resources officer Violence: Not At Risk  . Fear of Current or Ex-Partner: No  . Emotionally Abused: No  . Physically Abused: No  . Sexually Abused: No   Health Maintenance  Topic Date Due  . TETANUS/TDAP  12/31/2019  . MAMMOGRAM  10/08/2020  . COLONOSCOPY  01/22/2028  . INFLUENZA VACCINE  Completed  . DEXA SCAN  Completed  . COVID-19 Vaccine  Completed  . Hepatitis C Screening  Completed  . PNA vac Low Risk Adult  Completed    The following portions of the patient's history were reviewed  and updated as appropriate: allergies, current medications, past family history, past medical history, past social history, past surgical history and problem list.  Review of Systems A comprehensive review of systems was negative.   Objective:    BP 134/82 (BP Location: Left Arm, Patient Position: Sitting, Cuff Size: Normal)   Pulse 92   Temp 99.1 F (37.3 C) (Oral)   Wt 181 lb (82.1 kg)   LMP 08/30/2008   BMI 33.11 kg/m  General appearance: alert, cooperative and appears stated age Head: Normocephalic, without obvious abnormality, atraumatic Eyes: conj clear, EOMI, PEERLA Ears: normal TM's and external ear canals both ears Nose: Nares normal. Septum midline. Mucosa normal. No drainage or sinus tenderness. Throat: lips, mucosa, and tongue normal; teeth and  gums normal Neck: no adenopathy, no carotid bruit, no JVD, supple, symmetrical, trachea midline and thyroid not enlarged, symmetric, no tenderness/mass/nodules Back: negative Lungs: clear to auscultation bilaterally Breasts: not examined Heart: regular rate and rhythm, S1, S2 normal, no murmur, click, rub or gallop Abdomen: soft, non-tender; bowel sounds normal; no masses,  no organomegaly Extremities: extremities normal, atraumatic, no cyanosis or edema Pulses: 2+ and symmetric Skin: Skin color, texture, turgor normal. No rashes or lesions Lymph nodes: Cervical adenopathy: nl and Supraclavicular adenopathy: nl Neurologic: Alert and oriented X 3, normal strength and tone. Normal symmetric reflexes. Normal coordination and gait    Assessment:    Healthy female exam.     Plan:     See After Visit Summary for Counseling Recommendations   Keep up a regular exercise program and make sure you are eating a healthy diet Try to eat 4 servings of dairy a day, or if you are lactose intolerant take a calcium with vitamin D daily.  Your vaccines are up to date.  Reviewed lab work performed yesterday.

## 2019-10-22 NOTE — Patient Instructions (Signed)
TDAP vaccine at local pharmacy.

## 2019-10-22 NOTE — Progress Notes (Signed)
Acute Office Visit  Subjective:    Patient ID: [name], female    DOB: Sep 20, 1950, 69 y.o.   MRN: [id number]  Chief Complaint  Patient presents with  . Annual Exam  . Varicose Veins    HPI Patient is in today for medial left elbow pain.  Has been sore to touch and painful with certain motions like putting the dishes in the cabinet. No known injury or trauma.    She also c/o of swelling over the left medial ankle.  Has had swelling on the right side for years after her foot surgery.  Swelling is better in the morning.    Past Medical History:  Diagnosis Date  . ADD (attention deficit disorder)   . Arthritis    "bunion on  right" (08/11/2014)  . Chronic bronchitis ([place])    "plenty; but not q yr" (08/11/2014)  . Depression   . Dyslipidemia   . Environmental allergies   . GERD (gastroesophageal reflux disease)   . Hearing difficulty of left ear    "grew up w/deaf parent; find myself lip reading more recently" (08/11/2014)  . Hepatitis    "exposed in college; rec'd gamma globulin; 6 months later S/S (weakness, lethargy, fevers); ; dx'd infectious hepatitis"  . History of herpes genitalis    "stress induced reoccurance that shows up on my left middle finger" (08/11/2014)  . History of hiatal hernia   . History of stomach ulcers   . Hypercholesterolemia   . Hyperthyroidism 1994   "postpartum only; resolved itself"  . Incisional hernia   . Internal hemorrhoids   . Leg cramping   . Osteopenia   . Seasonal asthma   . Stress incontinence, female   . Ulcer     Past Surgical History:  Procedure Laterality Date  . CESAREAN SECTION  1994  . CHALAZION EXCISION Left ~ 2013  . DILATION AND CURETTAGE OF UTERUS  X 2  . EYE SURGERY    . HERNIA REPAIR  08/11/2014  . INCISIONAL HERNIA REPAIR N/A 08/11/2014   Procedure: LAPAROSCOPIC INCISIONAL HERNIA REPAIR;  Surgeon: Coralie Keens, MD;  Location: [facility];  Service: General;  Laterality: N/A;  . INCONTINENCE SURGERY  2005  .  INGUINAL HERNIA REPAIR Left 08/11/2014  . INGUINAL HERNIA REPAIR Left 08/11/2014   Procedure: LEFT INGUINAL HERNIA REPAIR;  Surgeon: Coralie Keens, MD;  Location: [facility];  Service: General;  Laterality: Left;  . INSERTION OF MESH Left 08/11/2014   Procedure: INSERTION OF MESH TO LEFT GROIN AND ABDOMEN;  Surgeon: Coralie Keens, MD;  Location: [facility];  Service: General;  Laterality: Left;  . LAPAROSCOPIC INCISIONAL / UMBILICAL / VENTRAL HERNIA REPAIR  08/11/2014   [facility]  . NASAL POLYP EXCISION  2014  . REPAIR OF PERFORATED ULCER  1985  . TONSILLECTOMY    . TOTAL SHOULDER ARTHROPLASTY Left 09/21/2017   Procedure: LEFT TOTAL SHOULDER ARTHROPLASTY;  Surgeon: Nicholes Stairs, MD;  Location: [facility];  Service: Orthopedics;  Laterality: Left;    Family History  Problem Relation Age of Onset  . Arthritis Mother   . Heart disease Mother        CABG @ 8  . Diabetes Mother   . Osteoporosis Mother   . Hyperlipidemia Mother   . Arthritis Father   . Lymphoma Father 9       chemo  . Cancer Father        lymphoma  . Lymphoma Paternal Grandmother   . Diabetes Maternal Grandmother   .  Colon cancer Neg Hx   . Rectal cancer Neg Hx   . Esophageal cancer Neg Hx     Social History   Socioeconomic History  . Marital status: Divorced    Spouse name: Not on file  . Number of children: 1  . Years of education: 68  . Highest education level: Bachelor's degree (e.g., BA, AB, BS)  Occupational History  . Occupation: Consulting civil engineer    Comment: Full time; Barista  Tobacco Use  . Smoking status: Former Smoker    Packs/day: 0.00    Types: Cigarettes  . Smokeless tobacco: Never Used  . Tobacco comment: Social Smoker in her 58's  Vaping Use  . Vaping Use: Never used  Substance and Sexual Activity  . Alcohol use: Yes    Alcohol/week: 2.0 - 4.0 standard drinks    Types: 2 - 4 Standard drinks or equivalent per week  . Drug use: No  . Sexual activity: Not Currently     Birth control/protection: Post-menopausal  Other Topics Concern  . Not on file  Social History Narrative   Marital status: divorced; not dating      Children: 1 daughter; no grandchildren      Lives:  In senior living apartment      Employment: Optometrist      Tobacco: used only socially in early 20's- never a regular user      Alcohol: 3-4 drinks a week- spread out      Drugs: never      Exercise: stretching exercises every day 15-66min- no vigorous activity   Social Determinants of Health   Financial Resource Strain: Low Risk   . Difficulty of Paying Living Expenses: Not hard at all  Food Insecurity: No Food Insecurity  . Worried About Charity fundraiser in the Last Year: Never true  . Ran Out of Food in the Last Year: Never true  Transportation Needs: No Transportation Needs  . Lack of Transportation (Medical): No  . Lack of Transportation (Non-Medical): No  Physical Activity: Inactive  . Days of Exercise per Week: 0 days  . Minutes of Exercise per Session: 0 min  Stress: No Stress Concern Present  . Feeling of Stress : Not at all  Social Connections: Moderately Integrated  . Frequency of Communication with Friends and Family: More than three times a week  . Frequency of Social Gatherings with Friends and Family: More than three times a week  . Attends Religious Services: More than 4 times per year  . Active Member of Clubs or Organizations: Yes  . Attends Archivist Meetings: More than 4 times per year  . Marital Status: Divorced  Human resources officer Violence: Not At Risk  . Fear of Current or Ex-Partner: No  . Emotionally Abused: No  . Physically Abused: No  . Sexually Abused: No    Outpatient Medications Prior to Visit  Medication Sig Dispense Refill  . acetaminophen (TYLENOL) 650 MG CR tablet Take 1,300 mg by mouth See admin instructions. Take 1,300 mg by mouth at bedtime and may also take an additional 1,300 mg one to two times a day as needed for  pain    . amphetamine-dextroamphetamine (ADDERALL) 20 MG tablet Take 1 tablet (20 mg total) by mouth 2 (two) times daily as needed. 30 tablet 0  . budesonide-formoterol (SYMBICORT) 160-4.5 MCG/ACT inhaler Inhale 2 puffs into the lungs daily. 9 g 1  . cyclobenzaprine (FLEXERIL) 10 MG tablet Take 1 tablet (10 mg  total) by mouth at bedtime as needed (for sleep or pain). 90 tablet 1  . DULoxetine (CYMBALTA) 30 MG capsule TAKE 2 CAPSULES IN THE MORNING  AND TAKE 1 CAPSULE IN THE EVENING 270 capsule 1  . loratadine (CLARITIN) 10 MG tablet Take 1 tablet (10 mg total) by mouth daily. 90 tablet 3  . Misc Natural Products (OSTEO BI-FLEX JOINT SHIELD PO) Take by mouth.    . Multiple Vitamins-Minerals (ONE-A-DAY WOMENS 50 PLUS) TABS Take 1 tablet by mouth daily.    . simvastatin (ZOCOR) 40 MG tablet TAKE 1 TABLET (40 MG TOTAL) BY MOUTH AT BEDTIME. 90 tablet 4  . Turmeric Curcumin 500 MG CAPS Take 1,500 mg by mouth daily.     Marland Kitchen amphetamine-dextroamphetamine (ADDERALL) 20 MG tablet Take 1 tablet (20 mg total) by mouth 2 (two) times daily as needed. (Patient not taking: Reported on 10/22/2019) 180 tablet 0   No facility-administered medications prior to visit.    Allergies  Allergen Reactions  . Aspirin Other (See Comments)    GI Intolerance; History of ulcers; GI BLEED  . Bee Venom Swelling    Any insects that bite or stings- swelling at site of sting or cellulitis can develop  . Nsaids Other (See Comments)    History of ulcers  . Vesicare [Solifenacin] Itching    Review of Systems     Objective:    Physical Exam Musculoskeletal:     Comments: Tender directly over the left medial epicondyle.  Skin:    Comments: Is some swelling of both medial ankles but a little bit worse on the left compared to the right.  She does have some scattered varicose veins and spider veins on both legs.  Good color of her feet.     BP 134/82 (BP Location: Left Arm, Patient Position: Sitting, Cuff Size: Normal)    Pulse 92   Temp 99.1 F (37.3 C) (Oral)   Wt 181 lb (82.1 kg)   LMP 08/30/2008   BMI 33.11 kg/m  Wt Readings from Last 3 Encounters:  10/22/19 181 lb (82.1 kg)  09/29/19 181 lb (82.1 kg)  08/22/19 187 lb (84.8 kg)    There are no preventive care reminders to display for this patient.  There are no preventive care reminders to display for this patient.   Lab Results  Component Value Date   TSH 1.73 09/25/2018   Lab Results  Component Value Date   WBC 6.2 10/21/2019   HGB 10.4 (L) 10/21/2019   HCT 34.2 (L) 10/21/2019   MCV 72.6 (L) 10/21/2019   PLT 399 10/21/2019   Lab Results  Component Value Date   NA 141 10/21/2019   K 5.2 10/21/2019   CO2 27 10/21/2019   GLUCOSE 108 (H) 10/21/2019   BUN 16 10/21/2019   CREATININE 0.74 10/21/2019   BILITOT 0.6 10/21/2019   ALKPHOS 104 07/02/2017   AST 23 10/21/2019   ALT 21 10/21/2019   PROT 6.6 10/21/2019   ALBUMIN 4.3 07/02/2017   CALCIUM 9.6 10/21/2019   ANIONGAP 7 09/22/2017   GFR 74.97 03/19/2013   Lab Results  Component Value Date   CHOL 212 (H) 10/21/2019   Lab Results  Component Value Date   HDL 57 10/21/2019   Lab Results  Component Value Date   LDLCALC 134 (H) 10/21/2019   Lab Results  Component Value Date   TRIG 105 10/21/2019   Lab Results  Component Value Date   CHOLHDL 3.7 10/21/2019   Lab Results  Component Value Date   HGBA1C 6.0 (A) 07/07/2019       Assessment & Plan:   Problem List Items Addressed This Visit      Other   Dyslipidemia    We also discussed that her LDL is not at goal.  Continue to work on healthy diet and regular exercise and weight loss.  In addition we will consider switching simvastatin to Livalo.  She got muscle aches in her thighs bilaterally on atorvastatin.  I do not want to increase the simvastatin to 80 mg because it can also increase risk of muscle cramping at that dose.  But she wants to finish out some of the simvastatin that she currently has before starting  a new one.       Other Visit Diagnoses    Need for influenza vaccination    -  Primary   Relevant Orders   Flu Vaccine QUAD High Dose(Fluad) (Completed)   Routine general medical examination at a health care facility       Medial epicondylitis of left elbow         Left medial epicondylitis-gave patient reassurance this is most consistent with a tendinitis and is usually amenable to rehab.  Will provide exercises to do on her own at home.  Overactive bladder-working with urology on this issue.  The Lisbeth Ply was quite expensive and so there can try a generic Detrol instead to see if this is helpful.  Plus she was not impressed with how effective the Toviaz really was.  Varicose veins-related to venous stasis insufficiency.  Compression stockings would help prevent further varicose veins.  Ankle swelling-again likely related to the venous system.  Though we could always consider getting ABIs just to look to make sure that she has good blood flow down to the feet and no evidence of peripheral vascular disease.    No orders of the defined types were placed in this encounter.    Beatrice Lecher, MD

## 2019-10-23 DIAGNOSIS — Z1231 Encounter for screening mammogram for malignant neoplasm of breast: Secondary | ICD-10-CM | POA: Diagnosis not present

## 2019-10-23 DIAGNOSIS — M85852 Other specified disorders of bone density and structure, left thigh: Secondary | ICD-10-CM | POA: Diagnosis not present

## 2019-10-23 DIAGNOSIS — M545 Low back pain: Secondary | ICD-10-CM | POA: Diagnosis not present

## 2019-10-23 DIAGNOSIS — M85851 Other specified disorders of bone density and structure, right thigh: Secondary | ICD-10-CM | POA: Diagnosis not present

## 2019-10-23 LAB — HM DEXA SCAN

## 2019-10-24 LAB — FERRITIN: Ferritin: 3 ng/mL — ABNORMAL LOW (ref 16–288)

## 2019-10-24 LAB — COMPLETE METABOLIC PANEL WITH GFR
AG Ratio: 1.9 (calc) (ref 1.0–2.5)
ALT: 21 U/L (ref 6–29)
AST: 23 U/L (ref 10–35)
Albumin: 4.3 g/dL (ref 3.6–5.1)
Alkaline phosphatase (APISO): 82 U/L (ref 37–153)
BUN: 16 mg/dL (ref 7–25)
CO2: 27 mmol/L (ref 20–32)
Calcium: 9.6 mg/dL (ref 8.6–10.4)
Chloride: 105 mmol/L (ref 98–110)
Creat: 0.74 mg/dL (ref 0.50–0.99)
GFR, Est African American: 96 mL/min/{1.73_m2} (ref >=60)
GFR, Est Non African American: 83 mL/min/{1.73_m2} (ref >=60)
Globulin: 2.3 g/dL (calc) (ref 1.9–3.7)
Glucose, Bld: 108 mg/dL — ABNORMAL HIGH (ref 65–99)
Potassium: 5.2 mmol/L (ref 3.5–5.3)
Sodium: 141 mmol/L (ref 135–146)
Total Bilirubin: 0.6 mg/dL (ref 0.2–1.2)
Total Protein: 6.6 g/dL (ref 6.1–8.1)

## 2019-10-24 LAB — CBC WITH DIFFERENTIAL/PLATELET
Absolute Monocytes: 552 cells/uL (ref 200–950)
Basophils Absolute: 50 cells/uL (ref 0–200)
Basophils Relative: 0.8 %
Eosinophils Absolute: 118 cells/uL (ref 15–500)
Eosinophils Relative: 1.9 %
HCT: 34.2 % — ABNORMAL LOW (ref 35.0–45.0)
Hemoglobin: 10.4 g/dL — ABNORMAL LOW (ref 11.7–15.5)
Lymphs Abs: 2207 cells/uL (ref 850–3900)
MCH: 22.1 pg — ABNORMAL LOW (ref 27.0–33.0)
MCHC: 30.4 g/dL — ABNORMAL LOW (ref 32.0–36.0)
MCV: 72.6 fL — ABNORMAL LOW (ref 80.0–100.0)
MPV: 10.4 fL (ref 7.5–12.5)
Monocytes Relative: 8.9 %
Neutro Abs: 3274 cells/uL (ref 1500–7800)
Neutrophils Relative %: 52.8 %
Platelets: 399 10*3/uL (ref 140–400)
RBC: 4.71 10*6/uL (ref 3.80–5.10)
RDW: 19.7 % — ABNORMAL HIGH (ref 11.0–15.0)
Total Lymphocyte: 35.6 %
WBC: 6.2 10*3/uL (ref 3.8–10.8)

## 2019-10-24 LAB — LIPID PANEL W/REFLEX DIRECT LDL
Cholesterol: 212 mg/dL — ABNORMAL HIGH (ref <200)
HDL: 57 mg/dL (ref >=50)
LDL Cholesterol (Calc): 134 mg/dL (calc) — ABNORMAL HIGH
Non-HDL Cholesterol (Calc): 155 mg/dL (calc) — ABNORMAL HIGH (ref <130)
Total CHOL/HDL Ratio: 3.7 (calc) (ref <5.0)
Triglycerides: 105 mg/dL (ref <150)

## 2019-10-24 LAB — IRON: Iron: 28 ug/dL — ABNORMAL LOW (ref 45–160)

## 2019-10-30 ENCOUNTER — Other Ambulatory Visit: Payer: Self-pay | Admitting: Neurology

## 2019-10-30 DIAGNOSIS — E611 Iron deficiency: Secondary | ICD-10-CM

## 2019-10-31 ENCOUNTER — Telehealth: Payer: Self-pay | Admitting: Family Medicine

## 2019-10-31 NOTE — Telephone Encounter (Signed)
Call pt: DEXA shows her t score has dropped slightly.  Make sure taking calcium with Vitamin D and perform weight bearing exercise.  Repeat in 2-3 years.

## 2019-11-03 ENCOUNTER — Encounter: Payer: Self-pay | Admitting: Family Medicine

## 2019-11-04 NOTE — Telephone Encounter (Signed)
Copy of DEXA mailed.

## 2019-11-04 NOTE — Telephone Encounter (Signed)
Patient reports that she had stopped taking her calcium-vitamin D.  How much should she be taking? Patient also stated that she would like to know how much iron supplement Dr. Madilyn Fireman recommends.  Patient requested a copy of the DEXA report be mailed to her home address.

## 2019-11-04 NOTE — Telephone Encounter (Signed)
Calcium  500-600mg  BID with a vitamin D in it for total of [phone] IU dailly.    If using OTC iron just take one a day.  Can be constipating if take more than that.

## 2019-11-05 DIAGNOSIS — M545 Low back pain, unspecified: Secondary | ICD-10-CM | POA: Diagnosis not present

## 2019-11-05 NOTE — Telephone Encounter (Signed)
Left detailed message on VM with recommendations.

## 2019-11-06 ENCOUNTER — Encounter: Payer: Self-pay | Admitting: Family Medicine

## 2019-11-07 DIAGNOSIS — M545 Low back pain, unspecified: Secondary | ICD-10-CM | POA: Diagnosis not present

## 2019-11-11 ENCOUNTER — Encounter: Payer: Self-pay | Admitting: Family Medicine

## 2019-11-11 DIAGNOSIS — M545 Low back pain, unspecified: Secondary | ICD-10-CM | POA: Diagnosis not present

## 2019-11-11 NOTE — Telephone Encounter (Signed)
Spoke to [place] the phone and she wanted an appt sooner than the end of the month (because she's a 40 min pt). I spoke to [place] and she said she would get back to me about finding one sooner. I did also tell Cybele to reach out to the pharmacist and see if he can answer her questions in the mean time. She said she'll call him and let me know if she still needs the appt after speaking to him.

## 2019-11-13 ENCOUNTER — Encounter: Payer: Self-pay | Admitting: Family Medicine

## 2019-11-13 DIAGNOSIS — M545 Low back pain, unspecified: Secondary | ICD-10-CM | POA: Diagnosis not present

## 2019-11-13 NOTE — Telephone Encounter (Signed)
We can put her in an 11:30 or end of the day next week but let her know if is a shorter appt

## 2019-11-18 DIAGNOSIS — M545 Low back pain, unspecified: Secondary | ICD-10-CM | POA: Diagnosis not present

## 2019-11-20 DIAGNOSIS — M545 Low back pain, unspecified: Secondary | ICD-10-CM | POA: Diagnosis not present

## 2019-11-26 ENCOUNTER — Encounter: Payer: Self-pay | Admitting: Family Medicine

## 2019-11-26 DIAGNOSIS — M545 Low back pain, unspecified: Secondary | ICD-10-CM | POA: Diagnosis not present

## 2019-11-28 DIAGNOSIS — M545 Low back pain, unspecified: Secondary | ICD-10-CM | POA: Diagnosis not present

## 2020-01-22 ENCOUNTER — Ambulatory Visit: Payer: Medicare HMO | Admitting: Family Medicine

## 2020-01-29 ENCOUNTER — Ambulatory Visit: Payer: Self-pay | Admitting: *Deleted

## 2020-01-29 NOTE — Telephone Encounter (Signed)
Patient had questions regarding Pfizer booster she received at a [facility]. Answered questions.  Reason for Disposition . Health Information question, no triage required and triager able to answer question  Answer Assessment - Initial Assessment Questions 1. REASON FOR CALL or QUESTION: "What is your reason for calling today?" or "How can I best help you?" or "What question do you have that I can help answer?"    Moderna and Pfizer booster questions  Protocols used: INFORMATION ONLY CALL - NO TRIAGE-A-AH

## 2020-02-20 ENCOUNTER — Encounter: Payer: Self-pay | Admitting: Family Medicine

## 2020-02-20 ENCOUNTER — Ambulatory Visit: Payer: Medicare HMO | Admitting: Family Medicine

## 2020-02-26 ENCOUNTER — Other Ambulatory Visit: Payer: Self-pay | Admitting: *Deleted

## 2020-02-26 ENCOUNTER — Encounter: Payer: Self-pay | Admitting: Family Medicine

## 2020-02-26 DIAGNOSIS — E611 Iron deficiency: Secondary | ICD-10-CM | POA: Diagnosis not present

## 2020-02-26 DIAGNOSIS — I10 Essential (primary) hypertension: Secondary | ICD-10-CM

## 2020-02-27 LAB — IRON,TIBC AND FERRITIN PANEL
%SAT: 22 % (calc) (ref 16–45)
Ferritin: 12 ng/mL — ABNORMAL LOW (ref 16–288)
Iron: 82 ug/dL (ref 45–160)
TIBC: 370 mcg/dL (calc) (ref 250–450)

## 2020-02-27 LAB — MAGNESIUM: Magnesium: 2 mg/dL (ref 1.5–2.5)

## 2020-03-03 NOTE — Telephone Encounter (Signed)
Needs to keep appt bc of ADD meds.  Last seen Sept.

## 2020-03-05 ENCOUNTER — Encounter: Payer: Self-pay | Admitting: Family Medicine

## 2020-03-05 ENCOUNTER — Other Ambulatory Visit: Payer: Self-pay

## 2020-03-05 ENCOUNTER — Ambulatory Visit (INDEPENDENT_AMBULATORY_CARE_PROVIDER_SITE_OTHER): Payer: Medicare HMO | Admitting: Family Medicine

## 2020-03-05 VITALS — BP 125/78 | HR 79 | Temp 99.2°F | Ht 61.81 in | Wt 183.6 lb

## 2020-03-05 DIAGNOSIS — F901 Attention-deficit hyperactivity disorder, predominantly hyperactive type: Secondary | ICD-10-CM

## 2020-03-05 DIAGNOSIS — E611 Iron deficiency: Secondary | ICD-10-CM | POA: Diagnosis not present

## 2020-03-05 DIAGNOSIS — R252 Cramp and spasm: Secondary | ICD-10-CM | POA: Diagnosis not present

## 2020-03-05 DIAGNOSIS — M8589 Other specified disorders of bone density and structure, multiple sites: Secondary | ICD-10-CM

## 2020-03-05 DIAGNOSIS — N3281 Overactive bladder: Secondary | ICD-10-CM

## 2020-03-05 MED ORDER — AMPHETAMINE-DEXTROAMPHET ER 25 MG PO CP24: 25.0000 mg | ORAL_CAPSULE | ORAL | 0 refills | Status: DC

## 2020-03-05 MED ORDER — ATORVASTATIN CALCIUM 40 MG PO TABS: 40.0000 mg | ORAL_TABLET | Freq: Every day | ORAL | 0 refills | Status: DC

## 2020-03-05 NOTE — Progress Notes (Signed)
Established Patient Office Visit  Subjective:  Patient ID: [name], female    DOB: Jun 10, 1950  Age: 70 y.o. MRN: [id number]  CC: No chief complaint on file.   HPI [name] presents for   ADD - Reports symptoms are well controlled on current regime. Denies any problems with insomnia, chest pain, palpitations, or SOB. She oftentimes takes 2 tabs total daily but more recently has been taking 3 tabs because she was just feeling unfocused and forgetful. But she also does not have a consistent sleep-wake cycle. She wonders if switching back to the Ritalin which she used to take would be helpful or not  Leg cramps - recently check magnesium and all looked normal.  Also drinking tonic water.  Had asked about adding elderberry. She says she is just really struggling with them they occur mostly at night and they are quite severe when they happen. It does not happen every night but it is frequent. She says she wears good supportive shoe wear during the day she is actually been trying to drink more water. As of changing her statin would make a difference.  Follow-up overactive bladder-has appointment with Dr. Vikki Ports, urology to discuss further treatment options. Are considering Botox. Does report the oxybutynin has been helpful.  Bone density/osteopia-last bone density test was September 2021 she would like a copy of it. She was on Fosamax at one time. But is currently on drug holiday with it. She does take calcium.  Iron deficiency-she has been taking her iron supplement.  Past Medical History:  Diagnosis Date  . ADD (attention deficit disorder)   . Arthritis    "bunion on  right" (08/11/2014)  . Chronic bronchitis ([place])    "plenty; but not q yr" (08/11/2014)  . Depression   . Dyslipidemia   . Environmental allergies   . GERD (gastroesophageal reflux disease)   . Hearing difficulty of left ear    "grew up w/deaf parent; find myself lip reading more recently" (08/11/2014)  .  Hepatitis    "exposed in college; rec'd gamma globulin; 6 months later S/S (weakness, lethargy, fevers); ; dx'd infectious hepatitis"  . History of herpes genitalis    "stress induced reoccurance that shows up on my left middle finger" (08/11/2014)  . History of hiatal hernia   . History of stomach ulcers   . Hypercholesterolemia   . Hyperthyroidism 1994   "postpartum only; resolved itself"  . Incisional hernia   . Internal hemorrhoids   . Leg cramping   . Osteopenia   . Seasonal asthma   . Stress incontinence, female   . Ulcer     Past Surgical History:  Procedure Laterality Date  . CESAREAN SECTION  1994  . CHALAZION EXCISION Left ~ 2013  . DILATION AND CURETTAGE OF UTERUS  X 2  . EYE SURGERY    . HERNIA REPAIR  08/11/2014  . INCISIONAL HERNIA REPAIR N/A 08/11/2014   Procedure: LAPAROSCOPIC INCISIONAL HERNIA REPAIR;  Surgeon: Coralie Keens, MD;  Location: [facility];  Service: General;  Laterality: N/A;  . INCONTINENCE SURGERY  2005  . INGUINAL HERNIA REPAIR Left 08/11/2014  . INGUINAL HERNIA REPAIR Left 08/11/2014   Procedure: LEFT INGUINAL HERNIA REPAIR;  Surgeon: Coralie Keens, MD;  Location: [facility];  Service: General;  Laterality: Left;  . INSERTION OF MESH Left 08/11/2014   Procedure: INSERTION OF MESH TO LEFT GROIN AND ABDOMEN;  Surgeon: Coralie Keens, MD;  Location: [facility];  Service: General;  Laterality: Left;  .  LAPAROSCOPIC INCISIONAL / UMBILICAL / VENTRAL HERNIA REPAIR  08/11/2014   [facility]  . NASAL POLYP EXCISION  2014  . REPAIR OF PERFORATED ULCER  1985  . TONSILLECTOMY    . TOTAL SHOULDER ARTHROPLASTY Left 09/21/2017   Procedure: LEFT TOTAL SHOULDER ARTHROPLASTY;  Surgeon: Nicholes Stairs, MD;  Location: [facility];  Service: Orthopedics;  Laterality: Left;    Family History  Problem Relation Age of Onset  . Arthritis Mother   . Heart disease Mother        CABG @ 26  . Diabetes Mother   . Osteoporosis Mother   . Hyperlipidemia Mother   . Arthritis Father   .  Lymphoma Father 53       [place]  . Cancer Father        lymphoma  . Lymphoma Paternal Grandmother   . Diabetes Maternal Grandmother   . Colon cancer Neg Hx   . Rectal cancer Neg Hx   . Esophageal cancer Neg Hx     Social History   Socioeconomic History  . Marital status: Single    Spouse name: Not on file  . Number of children: 1  . Years of education: 85  . Highest education level: Bachelor's degree (e.g., BA, AB, BS)  Occupational History  . Occupation: Consulting civil engineer    Comment: Full time; Barista  Tobacco Use  . Smoking status: Former Smoker    Packs/day: 0.00    Types: Cigarettes  . Smokeless tobacco: Never Used  . Tobacco comment: Social Smoker in her 84's  Vaping Use  . Vaping Use: Never used  Substance and Sexual Activity  . Alcohol use: Yes    Alcohol/week: 2.0 - 4.0 standard drinks    Types: 2 - 4 Standard drinks or equivalent per week  . Drug use: No  . Sexual activity: Not Currently    Birth control/protection: Post-menopausal  Other Topics Concern  . Not on file  Social History Narrative   Marital status: divorced; not dating      Children: 1 daughter; no grandchildren      Lives:  In senior living apartment      Employment: Optometrist      Tobacco: used only socially in early 20's- never a regular user      Alcohol: 3-4 drinks a week- spread out      Drugs: never      Exercise: stretching exercises every day 15-40min- no vigorous activity   Social Determinants of Health   Financial Resource Strain: Low Risk   . Difficulty of Paying Living Expenses: Not hard at all  Food Insecurity: No Food Insecurity  . Worried About Charity fundraiser in the Last Year: Never true  . Ran Out of Food in the Last Year: Never true  Transportation Needs: No Transportation Needs  . Lack of Transportation (Medical): No  . Lack of Transportation (Non-Medical): No  Physical Activity: Inactive  . Days of Exercise per Week: 0 days  .  Minutes of Exercise per Session: 0 min  Stress: No Stress Concern Present  . Feeling of Stress : Not at all  Social Connections: Moderately Integrated  . Frequency of Communication with Friends and Family: More than three times a week  . Frequency of Social Gatherings with Friends and Family: More than three times a week  . Attends Religious Services: More than 4 times per year  . Active Member of Clubs or Organizations: Yes  . Attends Club or  Organization Meetings: More than 4 times per year  . Marital Status: Divorced  Human resources officer Violence: Not At Risk  . Fear of Current or Ex-Partner: No  . Emotionally Abused: No  . Physically Abused: No  . Sexually Abused: No    Outpatient Medications Prior to Visit  Medication Sig Dispense Refill  . acetaminophen (TYLENOL) 650 MG CR tablet Take 1,300 mg by mouth See admin instructions. Take 1,300 mg by mouth at bedtime and may also take an additional 1,300 mg one to two times a day as needed for pain    . budesonide-formoterol (SYMBICORT) 160-4.5 MCG/ACT inhaler Inhale 2 puffs into the lungs daily. 9 g 1  . CALCIUM PO Take by mouth.    . cyclobenzaprine (FLEXERIL) 10 MG tablet Take 1 tablet (10 mg total) by mouth at bedtime as needed (for sleep or pain). 90 tablet 1  . DULoxetine (CYMBALTA) 30 MG capsule TAKE 2 CAPSULES IN THE MORNING  AND TAKE 1 CAPSULE IN THE EVENING 270 capsule 1  . Ferrous Sulfate Dried (FERROUS SULFATE IRON) 200 (65 Fe) MG TABS Take by mouth.    . loratadine (CLARITIN) 10 MG tablet Take 1 tablet (10 mg total) by mouth daily. 90 tablet 3  . Multiple Vitamins-Minerals (ONE-A-DAY WOMENS 50 PLUS) TABS Take 1 tablet by mouth daily.    Marland Kitchen oxybutynin (DITROPAN-XL) 10 MG 24 hr tablet oxybutynin chloride ER 10 mg tablet,extended release 24 hr  TAKE 1 TABLET BY MOUTH EVERY DAY    . Turmeric Curcumin 500 MG CAPS Take 1,500 mg by mouth daily.     Marland Kitchen amphetamine-dextroamphetamine (ADDERALL) 20 MG tablet Take 1 tablet (20 mg total) by  mouth 2 (two) times daily as needed. 180 tablet 0  . amphetamine-dextroamphetamine (ADDERALL) 20 MG tablet Take 1 tablet (20 mg total) by mouth 2 (two) times daily as needed. 30 tablet 0  . simvastatin (ZOCOR) 40 MG tablet TAKE 1 TABLET (40 MG TOTAL) BY MOUTH AT BEDTIME. 90 tablet 4  . Misc Natural Products (OSTEO BI-FLEX JOINT SHIELD PO) Take by mouth.     No facility-administered medications prior to visit.    Allergies  Allergen Reactions  . Aspirin Other (See Comments)    GI Intolerance; History of ulcers; GI BLEED  . Bee Venom Swelling    Any insects that bite or stings- swelling at site of sting or cellulitis can develop  . Nsaids Other (See Comments)    History of ulcers  . Vesicare [Solifenacin] Itching    ROS Review of Systems    Objective:    Physical Exam Vitals reviewed.  Constitutional:      Appearance: She is well-developed and well-nourished.  HENT:     Head: Normocephalic and atraumatic.  Eyes:     Extraocular Movements: EOM normal.     Conjunctiva/sclera: Conjunctivae normal.  Cardiovascular:     Rate and Rhythm: Normal rate and regular rhythm.     Heart sounds: Normal heart sounds.  Pulmonary:     Effort: Pulmonary effort is normal.     Breath sounds: Normal breath sounds.  Skin:    General: Skin is warm and dry.     Coloration: Skin is not pale.  Neurological:     Mental Status: She is alert and oriented to person, place, and time.  Psychiatric:        Mood and Affect: Mood and affect normal.        Behavior: Behavior normal.     BP 125/78 (BP  Location: Left Arm, Patient Position: Sitting, Cuff Size: Large)   Pulse 79   Temp 99.2 F (37.3 C) (Oral)   Ht 5' 1.81" (1.57 m)   Wt 183 lb 9.6 oz (83.3 kg)   LMP 08/30/2008   SpO2 98%   BMI 33.79 kg/m  Wt Readings from Last 3 Encounters:  03/05/20 183 lb 9.6 oz (83.3 kg)  10/22/19 181 lb (82.1 kg)  09/29/19 181 lb (82.1 kg)     There are no preventive care reminders to display for this  patient.  There are no preventive care reminders to display for this patient.  Lab Results  Component Value Date   TSH 1.73 09/25/2018   Lab Results  Component Value Date   WBC 6.2 10/21/2019   HGB 10.4 (L) 10/21/2019   HCT 34.2 (L) 10/21/2019   MCV 72.6 (L) 10/21/2019   PLT 399 10/21/2019   Lab Results  Component Value Date   NA 141 10/21/2019   K 5.2 10/21/2019   CO2 27 10/21/2019   GLUCOSE 108 (H) 10/21/2019   BUN 16 10/21/2019   CREATININE 0.74 10/21/2019   BILITOT 0.6 10/21/2019   ALKPHOS 104 07/02/2017   AST 23 10/21/2019   ALT 21 10/21/2019   PROT 6.6 10/21/2019   ALBUMIN 4.3 07/02/2017   CALCIUM 9.6 10/21/2019   ANIONGAP 7 09/22/2017   GFR 74.97 03/19/2013   Lab Results  Component Value Date   CHOL 212 (H) 10/21/2019   Lab Results  Component Value Date   HDL 57 10/21/2019   Lab Results  Component Value Date   LDLCALC 134 (H) 10/21/2019   Lab Results  Component Value Date   TRIG 105 10/21/2019   Lab Results  Component Value Date   CHOLHDL 3.7 10/21/2019   Lab Results  Component Value Date   HGBA1C 6.0 (A) 07/07/2019      Assessment & Plan:   Problem List Items Addressed This Visit      Musculoskeletal and Integument   Osteopenia    She did have a slight decline in her DEXA scan from 20 19-20 21 it was -1.6 down to -1.9. She has been on alendronate in the past and took it for about 3 to 4 years. It would not be unreasonable to consider restarting the medication if her T score drops below 1.9 on her repeat scan next year. In the meantime make sure taking adequate calcium with vitamin D and getting weightbearing exercise regularly.        Genitourinary   OAB (overactive bladder)    has a consult coming up with Dr. Jacquelyne Balint. She is considering Botox injections but does find the oxybutynin helpful.        Other   Leg cramps    Unfortunately she is still struggling with leg cramps it sounds like she is hydrating well she is wearing good  supportive shoe wear she does not stretch consistently so we did discuss that. There are medications that can be prescribed for leg cramping as needed but she would like to try switching to a different statin for at least a month to see if she notices any provement. So we will switch her simvastatin to atorvastatin.      ADD (attention deficit disorder) - Primary    Discussed options. We can either try extended release version of the Adderall which she is currently on or even switch back to the Ritalin/methylphenidate that she was on previously. Sometimes going back and forth between medications  can be helpful. She would like to try the Adderall extended release for a month first and then make a decision about what fits best.       Other Visit Diagnoses    Iron deficiency       Leg cramping         Iron deficiency-we will continue with iron supplementation and plan to recheck labs in 2 to 3 months. Ferritin did go up slightly from 3-12 over a 3 months. So I am hopeful she will be able to absorb what she needs through her diet and supplementation. Again plan to recheck in about 3 months and hopefully it will have doubled by that point.  Meds ordered this encounter  Medications  . amphetamine-dextroamphetamine (ADDERALL XR) 25 MG 24 hr capsule    Sig: Take 1 capsule by mouth every morning.    Dispense:  30 capsule    Refill:  0  . atorvastatin (LIPITOR) 40 MG tablet    Sig: Take 1 tablet (40 mg total) by mouth at bedtime.    Dispense:  30 tablet    Refill:  0    Follow-up: Return in about 1 month (around 04/02/2020) for change in ADD medication .   I spent 42 minutes on the day of the encounter to include pre-visit record review, face-to-face time with the patient and post visit ordering of test.   Beatrice Lecher, MD

## 2020-03-05 NOTE — Assessment & Plan Note (Signed)
Discussed options. We can either try extended release version of the Adderall which she is currently on or even switch back to the Ritalin/methylphenidate that she was on previously. Sometimes going back and forth between medications can be helpful. She would like to try the Adderall extended release for a month first and then make a decision about what fits best.

## 2020-03-05 NOTE — Patient Instructions (Signed)
Leg Cramps Leg cramps occur when one or more muscles tighten and a person has no control over it (involuntary muscle contraction). Muscle cramps are most common in the calf muscles of the leg. They can occur during exercise or at rest. Leg cramps are painful, and they may last for a few seconds to a few minutes. Cramps may return several times before they finally stop. Usually, leg cramps are not caused by a serious medical problem. In many cases, the cause is not known. Some common causes include:  Excessive physical effort (overexertion), such as during intense exercise.  Doing the same motion over and over.  Staying in a certain position for a long period of time.  Improper preparation, form, or technique while doing a sport or an activity.  Dehydration.  Injury.  Side effects of certain medicines.  Abnormally low levels of minerals in your blood (electrolytes), especially potassium and calcium. This could result from: ? Pregnancy. ? Taking diuretic medicines. Follow these instructions at home: Eating and drinking  Drink enough fluid to keep your urine pale yellow. Staying hydrated may help prevent cramps.  Eat a healthy diet that includes plenty of nutrients to help your muscles function. A healthy diet includes fruits and vegetables, lean protein, whole grains, and low-fat or nonfat dairy products. Managing pain, stiffness, and swelling  Try massaging, stretching, and relaxing the affected muscle. Do this for several minutes at a time.  If directed, put ice on areas that are sore or painful after a cramp. To do this: ? Put ice in a plastic bag. ? Place a towel between your skin and the bag. ? Leave the ice on for 20 minutes, 2-3 times a day. ? Remove the ice if your skin turns bright red. This is very important. If you cannot feel pain, heat, or cold, you have a greater risk of damage to the area.  If directed, apply heat to muscles that are tense or tight. Do this before  you exercise, or as often as told by your health care provider. Use the heat source that your health care provider recommends, such as a moist heat pack or a heating pad. To do this: ? Place a towel between your skin and the heat source. ? Leave the heat on for 20-30 minutes. ? Remove the heat if your skin turns bright red. This is especially important if you are unable to feel pain, heat, or cold. You may have a greater risk of getting burned.  Try taking hot showers or baths to help relax tight muscles.      General instructions  If you are having frequent leg cramps, avoid intense exercise for several days.  Take over-the-counter and prescription medicines only as told by your health care provider.  Keep all follow-up visits. This is important. Contact a health care provider if:  Your leg cramps get more severe or more frequent, or they do not improve over time.  Your foot becomes cold, numb, or blue. Summary  Muscle cramps can develop in any muscle, but the most common place is in the calf muscles of the leg.  Leg cramps are painful, and they may last for a few seconds to a few minutes.  Usually, leg cramps are not caused by a serious medical problem. Often, the cause is not known.  Stay hydrated, and take over-the-counter and prescription medicines only as told by your health care provider. This information is not intended to replace advice given to you by your   health care provider. Make sure you discuss any questions you have with your health care provider. Document Revised: 06/04/2019 Document Reviewed: 06/04/2019 Elsevier Patient Education  2021 [employer]  

## 2020-03-05 NOTE — Assessment & Plan Note (Signed)
Unfortunately she is still struggling with leg cramps it sounds like she is hydrating well she is wearing good supportive shoe wear she does not stretch consistently so we did discuss that. There are medications that can be prescribed for leg cramping as needed but she would like to try switching to a different statin for at least a month to see if she notices any provement. So we will switch her simvastatin to atorvastatin.

## 2020-03-05 NOTE — Assessment & Plan Note (Signed)
She did have a slight decline in her DEXA scan from 20 19-20 21 it was -1.6 down to -1.9. She has been on alendronate in the past and took it for about 3 to 4 years. It would not be unreasonable to consider restarting the medication if her T score drops below 1.9 on her repeat scan next year. In the meantime make sure taking adequate calcium with vitamin D and getting weightbearing exercise regularly.

## 2020-03-05 NOTE — Assessment & Plan Note (Signed)
has a consult coming up with Dr. Vikki Ports. She is considering Botox injections but does find the oxybutynin helpful.

## 2020-03-09 DIAGNOSIS — R350 Frequency of micturition: Secondary | ICD-10-CM | POA: Diagnosis not present

## 2020-03-09 DIAGNOSIS — M5416 Radiculopathy, lumbar region: Secondary | ICD-10-CM | POA: Diagnosis not present

## 2020-03-09 DIAGNOSIS — N3946 Mixed incontinence: Secondary | ICD-10-CM | POA: Diagnosis not present

## 2020-03-09 DIAGNOSIS — M7989 Other specified soft tissue disorders: Secondary | ICD-10-CM | POA: Diagnosis not present

## 2020-03-12 ENCOUNTER — Ambulatory Visit: Payer: Medicare HMO | Admitting: Family Medicine

## 2020-03-12 DIAGNOSIS — M170 Bilateral primary osteoarthritis of knee: Secondary | ICD-10-CM | POA: Diagnosis not present

## 2020-03-19 DIAGNOSIS — M25561 Pain in right knee: Secondary | ICD-10-CM | POA: Diagnosis not present

## 2020-03-19 DIAGNOSIS — M25562 Pain in left knee: Secondary | ICD-10-CM | POA: Diagnosis not present

## 2020-03-19 DIAGNOSIS — M170 Bilateral primary osteoarthritis of knee: Secondary | ICD-10-CM | POA: Diagnosis not present

## 2020-03-19 DIAGNOSIS — M7989 Other specified soft tissue disorders: Secondary | ICD-10-CM | POA: Diagnosis not present

## 2020-03-25 DIAGNOSIS — M25511 Pain in right shoulder: Secondary | ICD-10-CM | POA: Diagnosis not present

## 2020-03-25 DIAGNOSIS — M19011 Primary osteoarthritis, right shoulder: Secondary | ICD-10-CM | POA: Diagnosis not present

## 2020-03-25 DIAGNOSIS — Z9660 Presence of unspecified orthopedic joint implant: Secondary | ICD-10-CM | POA: Diagnosis not present

## 2020-03-25 DIAGNOSIS — M25512 Pain in left shoulder: Secondary | ICD-10-CM | POA: Diagnosis not present

## 2020-03-25 DIAGNOSIS — M5416 Radiculopathy, lumbar region: Secondary | ICD-10-CM | POA: Diagnosis not present

## 2020-03-26 DIAGNOSIS — M170 Bilateral primary osteoarthritis of knee: Secondary | ICD-10-CM | POA: Diagnosis not present

## 2020-04-01 ENCOUNTER — Encounter: Payer: Self-pay | Admitting: Family Medicine

## 2020-04-01 ENCOUNTER — Telehealth: Payer: Self-pay

## 2020-04-01 ENCOUNTER — Ambulatory Visit (INDEPENDENT_AMBULATORY_CARE_PROVIDER_SITE_OTHER): Payer: Medicare HMO | Admitting: Family Medicine

## 2020-04-01 ENCOUNTER — Other Ambulatory Visit: Payer: Self-pay

## 2020-04-01 VITALS — BP 129/75 | HR 93 | Ht 61.81 in | Wt 179.7 lb

## 2020-04-01 DIAGNOSIS — E611 Iron deficiency: Secondary | ICD-10-CM | POA: Diagnosis not present

## 2020-04-01 DIAGNOSIS — N3281 Overactive bladder: Secondary | ICD-10-CM

## 2020-04-01 DIAGNOSIS — R252 Cramp and spasm: Secondary | ICD-10-CM | POA: Diagnosis not present

## 2020-04-01 DIAGNOSIS — F901 Attention-deficit hyperactivity disorder, predominantly hyperactive type: Secondary | ICD-10-CM

## 2020-04-01 DIAGNOSIS — E785 Hyperlipidemia, unspecified: Secondary | ICD-10-CM

## 2020-04-01 MED ORDER — AMPHETAMINE-DEXTROAMPHET ER 25 MG PO CP24: 25.0000 mg | ORAL_CAPSULE | ORAL | 0 refills | Status: DC

## 2020-04-01 NOTE — Progress Notes (Signed)
Established Patient Office Visit  Subjective:  Patient ID: [name], female    DOB: [birth year]  Age: 70 y.o. MRN: [id number]  CC: No chief complaint on file.   HPI [name] presents for   Follow-up of ADD.  When I last saw her about 4 weeks ago we had discussed potentially switching her ADD medication.  She wanted to try extended release Adderall to see if maybe she is just been on the other medication a little too long and was getting used to it.  She reports that she received a letter from her insurance company that the Adderall will not be covered she is tried amphetamine salts, Vyvanse and Strattera in the past.  Follow-up leg cramps-we had discussed at last office visit that she had already tried some conservative measures and really was not noticing much improvement.  We decided to switch statins for a month so we switched her from simvastatin to atorvastatin just to see if she noticed a difference make sure it was not a medication side effect.  Urology has put her on [place] for her bladder.  This is in addition to the oxybutynin.  She reports that it is working well and thus far she is actually happy with the results.  She is only been getting up about once at night and sometimes will make it until almost 5 AM.  She is concerned though about the shape of her abdominal wall she has noticed that it just seems to be protruding and bulging a little bit more than it was before with may be a little bit more so on the right side she has had previous abdominal wall hernia repairs and wonders if she could actually be getting a new hernia.  Wanted to let me know that she has received an injection in her right knee, right shoulder and in her back last week.  She feels like the steroids might be affecting her a little bit...   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  She also gets  chronic swelling in her ankles.  Past Medical History:  Diagnosis Date  . ADD (attention deficit disorder)   . Arthritis    "bunion on  right" (08/11/2014)  . Chronic bronchitis ([place])    "plenty; but not q yr" (08/11/2014)  . Depression   . Dyslipidemia   . Environmental allergies   . GERD (gastroesophageal reflux disease)   . Hearing difficulty of left ear    "grew up w/deaf parent; find myself lip reading more recently" (08/11/2014)  . Hepatitis    "exposed in college; rec'd gamma globulin; 6 months later S/S (weakness, lethargy, fevers); ; dx'd infectious hepatitis"  . History of herpes genitalis    "stress induced reoccurance that shows up on my left middle finger" (08/11/2014)  . History of hiatal hernia   . History of stomach ulcers   . Hypercholesterolemia   . Hyperthyroidism 1994   "postpartum only; resolved itself"  . Incisional hernia   . Internal hemorrhoids   . Leg cramping   . Osteopenia   . Seasonal asthma   . Stress incontinence, female   . Ulcer     Past Surgical History:  Procedure Laterality Date  . CESAREAN SECTION  1994  . CHALAZION EXCISION Left ~ 2013  . DILATION AND CURETTAGE OF UTERUS  X 2  . EYE SURGERY    .  HERNIA REPAIR  08/11/2014  . INCISIONAL HERNIA REPAIR N/A 08/11/2014   Procedure: LAPAROSCOPIC INCISIONAL HERNIA REPAIR;  Surgeon: Coralie Keens, MD;  Location: [place];  Service: General;  Laterality: N/A;  . INCONTINENCE SURGERY  2005  . INGUINAL HERNIA REPAIR Left 08/11/2014  . INGUINAL HERNIA REPAIR Left 08/11/2014   Procedure: LEFT INGUINAL HERNIA REPAIR;  Surgeon: Coralie Keens, MD;  Location: [facility];  Service: General;  Laterality: Left;  . INSERTION OF MESH Left 08/11/2014   Procedure: INSERTION OF MESH TO LEFT GROIN AND ABDOMEN;  Surgeon: Coralie Keens, MD;  Location: [facility];  Service: General;  Laterality: Left;  . LAPAROSCOPIC INCISIONAL / UMBILICAL / VENTRAL HERNIA REPAIR  08/11/2014   [facility]  . NASAL POLYP EXCISION  2014  . REPAIR  OF PERFORATED ULCER  1985  . TONSILLECTOMY    . TOTAL SHOULDER ARTHROPLASTY Left 09/21/2017   Procedure: LEFT TOTAL SHOULDER ARTHROPLASTY;  Surgeon: Nicholes Stairs, MD;  Location: [facility];  Service: Orthopedics;  Laterality: Left;    Family History  Problem Relation Age of Onset  . Arthritis Mother   . Heart disease Mother        CABG @ 27  . Diabetes Mother   . Osteoporosis Mother   . Hyperlipidemia Mother   . Arthritis Father   . Lymphoma Father 30       chemo  . Cancer Father        lymphoma  . Lymphoma Paternal Grandmother   . Diabetes Maternal Grandmother   . Colon cancer Neg Hx   . Rectal cancer Neg Hx   . Esophageal cancer Neg Hx     Social History   Socioeconomic History  . Marital status: Single    Spouse name: Not on file  . Number of children: 1  . Years of education: 26  . Highest education level: Bachelor's degree (e.g., BA, AB, BS)  Occupational History  . Occupation: Consulting civil engineer    Comment: Full time; Barista  Tobacco Use  . Smoking status: Former Smoker    Packs/day: 0.00    Types: Cigarettes  . Smokeless tobacco: Never Used  . Tobacco comment: Social Smoker in her 55's  Vaping Use  . Vaping Use: Never used  Substance and Sexual Activity  . Alcohol use: Yes    Alcohol/week: 2.0 - 4.0 standard drinks    Types: 2 - 4 Standard drinks or equivalent per week  . Drug use: No  . Sexual activity: Not Currently    Birth control/protection: Post-menopausal  Other Topics Concern  . Not on file  Social History Narrative   Marital status: divorced; not dating      Children: 1 daughter; no grandchildren      Lives:  In senior living apartment      Employment: Optometrist      Tobacco: used only socially in early 20's- never a regular user      Alcohol: 3-4 drinks a week- spread out      Drugs: never      Exercise: stretching exercises every day 15-78min- no vigorous activity   Social Determinants of Health    Financial Resource Strain: Low Risk   . Difficulty of Paying Living Expenses: Not hard at all  Food Insecurity: No Food Insecurity  . Worried About Charity fundraiser in the Last Year: Never true  . Ran Out of Food in the Last Year: Never true  Transportation Needs: No Transportation Needs  .  Lack of Transportation (Medical): No  . Lack of Transportation (Non-Medical): No  Physical Activity: Inactive  . Days of Exercise per Week: 0 days  . Minutes of Exercise per Session: 0 min  Stress: No Stress Concern Present  . Feeling of Stress : Not at all  Social Connections: Moderately Integrated  . Frequency of Communication with Friends and Family: More than three times a week  . Frequency of Social Gatherings with Friends and Family: More than three times a week  . Attends Religious Services: More than 4 times per year  . Active Member of Clubs or Organizations: Yes  . Attends Archivist Meetings: More than 4 times per year  . Marital Status: Divorced  Human resources officer Violence: Not At Risk  . Fear of Current or Ex-Partner: No  . Emotionally Abused: No  . Physically Abused: No  . Sexually Abused: No    Outpatient Medications Prior to Visit  Medication Sig Dispense Refill  . acetaminophen (TYLENOL) 650 MG CR tablet Take 1,300 mg by mouth See admin instructions. Take 1,300 mg by mouth at bedtime and may also take an additional 1,300 mg one to two times a day as needed for pain    . atorvastatin (LIPITOR) 40 MG tablet Take 1 tablet (40 mg total) by mouth at bedtime. 30 tablet 0  . budesonide-formoterol (SYMBICORT) 160-4.5 MCG/ACT inhaler Inhale 2 puffs into the lungs daily. 9 g 1  . CALCIUM PO Take by mouth.    . cyclobenzaprine (FLEXERIL) 10 MG tablet Take 1 tablet (10 mg total) by mouth at bedtime as needed (for sleep or pain). 90 tablet 1  . DULoxetine (CYMBALTA) 30 MG capsule TAKE 2 CAPSULES IN THE MORNING  AND TAKE 1 CAPSULE IN THE EVENING 270 capsule 1  . Ferrous  Sulfate Dried (FERROUS SULFATE IRON) 200 (65 Fe) MG TABS Take by mouth.    . loratadine (CLARITIN) 10 MG tablet Take 1 tablet (10 mg total) by mouth daily. 90 tablet 3  . Multiple Vitamins-Minerals (ONE-A-DAY WOMENS 50 PLUS) TABS Take 1 tablet by mouth daily.    Marland Kitchen oxybutynin (DITROPAN-XL) 10 MG 24 hr tablet oxybutynin chloride ER 10 mg tablet,extended release 24 hr  TAKE 1 TABLET BY MOUTH EVERY DAY    . Turmeric Curcumin 500 MG CAPS Take 1,500 mg by mouth daily.     . Vibegron (GEMTESA) 75 MG TABS Take by mouth.    Marland Kitchen amphetamine-dextroamphetamine (ADDERALL XR) 25 MG 24 hr capsule Take 1 capsule by mouth every morning. 30 capsule 0   No facility-administered medications prior to visit.    Allergies  Allergen Reactions  . Aspirin Other (See Comments)    GI Intolerance; History of ulcers; GI BLEED  . Bee Venom Swelling    Any insects that bite or stings- swelling at site of sting or cellulitis can develop  . Nsaids Other (See Comments)    History of ulcers  . Vesicare [Solifenacin] Itching    ROS Review of Systems    Objective:    Physical Exam Constitutional:      Appearance: She is well-developed and well-nourished.  HENT:     Head: Normocephalic and atraumatic.  Cardiovascular:     Rate and Rhythm: Normal rate and regular rhythm.     Heart sounds: Normal heart sounds.  Pulmonary:     Effort: Pulmonary effort is normal.     Breath sounds: Normal breath sounds.  Musculoskeletal:     Comments: He had some bruising and  swelling over the right lateral knee where she just had an injection last Friday about a week ago as well as a little bit of bruising just distal to the knee and that same area.  Bilateral ankle swelling.  Skin:    General: Skin is warm and dry.  Neurological:     Mental Status: She is alert and oriented to person, place, and time.  Psychiatric:        Mood and Affect: Mood and affect normal.        Behavior: Behavior normal.     BP 129/75   Pulse 93    Ht 5' 1.81" (1.57 m)   Wt 179 lb 11.2 oz (81.5 kg)   LMP 08/30/2008   SpO2 98%   BMI 33.07 kg/m  Wt Readings from Last 3 Encounters:  04/01/20 179 lb 11.2 oz (81.5 kg)  03/05/20 183 lb 9.6 oz (83.3 kg)  10/22/19 181 lb (82.1 kg)     There are no preventive care reminders to display for this patient.  There are no preventive care reminders to display for this patient.  Lab Results  Component Value Date   TSH 1.73 09/25/2018   Lab Results  Component Value Date   WBC 6.2 10/21/2019   HGB 10.4 (L) 10/21/2019   HCT 34.2 (L) 10/21/2019   MCV 72.6 (L) 10/21/2019   PLT 399 10/21/2019   Lab Results  Component Value Date   NA 141 10/21/2019   K 5.2 10/21/2019   CO2 27 10/21/2019   GLUCOSE 108 (H) 10/21/2019   BUN 16 10/21/2019   CREATININE 0.74 10/21/2019   BILITOT 0.6 10/21/2019   ALKPHOS 104 07/02/2017   AST 23 10/21/2019   ALT 21 10/21/2019   PROT 6.6 10/21/2019   ALBUMIN 4.3 07/02/2017   CALCIUM 9.6 10/21/2019   ANIONGAP 7 09/22/2017   GFR 74.97 03/19/2013   Lab Results  Component Value Date   CHOL 212 (H) 10/21/2019   Lab Results  Component Value Date   HDL 57 10/21/2019   Lab Results  Component Value Date   LDLCALC 134 (H) 10/21/2019   Lab Results  Component Value Date   TRIG 105 10/21/2019   Lab Results  Component Value Date   CHOLHDL 3.7 10/21/2019   Lab Results  Component Value Date   HGBA1C 6.0 (A) 07/07/2019      Assessment & Plan:   Problem List Items Addressed This Visit      Genitourinary   OAB (overactive bladder)    Along with urology and doing well on the combination of oxybutynin and GEmtesa.         Other   Leg cramps    She really has not noticed a big difference in the leg cramps and switching statins.  Could always consider holding it completely for a couple of weeks and that would certainly be an option.      Iron deficiency    To recheck iron levels in May.      Dyslipidemia    No significant changes since  switching to atorvastatin 40 mg plan to recheck liver and lipids in May when we recheck her iron levels.      ADD (attention deficit disorder) - Primary    So far doing well on Adderall XR.  We will try to initiate prior authorization.  She says if it is not covered she might be willing to try an extended release methylphenidate.  Prior authorization completed this afternoon.  Relevant Medications   amphetamine-dextroamphetamine (ADDERALL XR) 25 MG 24 hr capsule    Other Visit Diagnoses    Leg cramping         Prior abdominal wall hernias she is noticing some bulging now and wonders if she could have some new hernias develop and that is certainly possible we discussed that there is a low risk for complications and that we could consider further work-up and possible repair if it is bothersome to her or just continue to monitor.  Would likely need a CAT scan to evaluate.  That this does not always pick up on the hernias.  Bruising on the right lateral knee where she had an injection last week.  Just encouraged her to keep an eye on it and ice it and if its not resolving test call her orthopedist.  Meds ordered this encounter  Medications  . amphetamine-dextroamphetamine (ADDERALL XR) 25 MG 24 hr capsule    Sig: Take 1 capsule by mouth every morning.    Dispense:  30 capsule    Refill:  0    Follow-up: Return in about 3 months (around 07/02/2020) for ADD medication .   I spent 42 minutes on the day of the encounter to include pre-visit record review, face-to-face time with the patient and post visit ordering of test.    Beatrice Lecher, MD

## 2020-04-01 NOTE — Telephone Encounter (Signed)
During patient's appt today with Dr. Suzi Roots, she brought a letter from her insurance company stating that a PA was needed for her Adderall XR 25mg .  Using Covermymeds, a PA was completed and submitted; awaiting response.

## 2020-04-01 NOTE — Assessment & Plan Note (Signed)
No significant changes since switching to atorvastatin 40 mg plan to recheck liver and lipids in May when we recheck her iron levels.

## 2020-04-01 NOTE — Assessment & Plan Note (Signed)
So far doing well on Adderall XR.  We will try to initiate prior authorization.  She says if it is not covered she might be willing to try an extended release methylphenidate.  Prior authorization completed this afternoon.

## 2020-04-01 NOTE — Assessment & Plan Note (Signed)
To recheck iron levels in May.

## 2020-04-01 NOTE — Assessment & Plan Note (Signed)
She really has not noticed a big difference in the leg cramps and switching statins.  Could always consider holding it completely for a couple of weeks and that would certainly be an option.

## 2020-04-01 NOTE — Assessment & Plan Note (Signed)
Along with urology and doing well on the combination of oxybutynin and GEmtesa.

## 2020-04-08 ENCOUNTER — Ambulatory Visit: Payer: Medicare HMO | Admitting: Podiatry

## 2020-04-08 ENCOUNTER — Encounter: Payer: Self-pay | Admitting: Family Medicine

## 2020-04-08 ENCOUNTER — Ambulatory Visit (INDEPENDENT_AMBULATORY_CARE_PROVIDER_SITE_OTHER): Payer: Medicare HMO

## 2020-04-08 ENCOUNTER — Other Ambulatory Visit: Payer: Self-pay

## 2020-04-08 DIAGNOSIS — M779 Enthesopathy, unspecified: Secondary | ICD-10-CM | POA: Diagnosis not present

## 2020-04-08 DIAGNOSIS — R600 Localized edema: Secondary | ICD-10-CM | POA: Diagnosis not present

## 2020-04-08 DIAGNOSIS — M205X1 Other deformities of toe(s) (acquired), right foot: Secondary | ICD-10-CM

## 2020-04-08 MED ORDER — TRIAMCINOLONE ACETONIDE 10 MG/ML IJ SUSP
10.0000 mg | Freq: Once | INTRAMUSCULAR | Status: AC
  Administered 2020-04-08: 10 mg

## 2020-04-08 NOTE — Progress Notes (Signed)
Subjective:   Patient ID: [name], female   DOB: 70 y.o.   MRN: [id number]   HPI Patient presents stating my midfoot right has been very sore and it feels like my foot is functioning differently.  I do not have any trouble walking but it sore on top and gets swollen and also I have some nails that get thick at times   ROS      Objective:  Physical Exam  Neurovascular status was found to be intact muscle strength was found to be adequate with discomfort on the dorsum of the right foot midtarsal joint with inflammation of the extensor complex.  It appears there also may be some arthritis here she has arthritis of her big toe joint right overall the digits are in reasonable clinical position with some thickness of the nailbed secondary to pressure against the nails.  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  At this point I went ahead and get a focus on this midfoot to try to help reduce her inflammation as I do not think there is any form of surgical intervention that would be of benefit.  I did sterile prep injected the extensor complex 3 mg Kenalog 5 mg Xylocaine advised on heat ice therapy and reappoint to recheck and do not recommend any treatment for digits or nailbeds  X-rays indicate significant midfoot arthritis with adductus deformity of the foot and arthritis of the big toe joint right

## 2020-04-09 ENCOUNTER — Other Ambulatory Visit: Payer: Self-pay

## 2020-04-09 ENCOUNTER — Encounter: Payer: Self-pay | Admitting: Family Medicine

## 2020-04-09 ENCOUNTER — Ambulatory Visit (INDEPENDENT_AMBULATORY_CARE_PROVIDER_SITE_OTHER): Payer: Medicare HMO | Admitting: Family Medicine

## 2020-04-09 VITALS — BP 136/83 | HR 83 | Ht 62.0 in | Wt 179.0 lb

## 2020-04-09 DIAGNOSIS — E785 Hyperlipidemia, unspecified: Secondary | ICD-10-CM | POA: Diagnosis not present

## 2020-04-09 DIAGNOSIS — S61209A Unspecified open wound of unspecified finger without damage to nail, initial encounter: Secondary | ICD-10-CM

## 2020-04-09 MED ORDER — ATORVASTATIN CALCIUM 40 MG PO TABS: 40.0000 mg | ORAL_TABLET | Freq: Every day | ORAL | 3 refills | Status: AC

## 2020-04-09 MED ORDER — DOXYCYCLINE HYCLATE 100 MG PO TABS: 100.0000 mg | ORAL_TABLET | Freq: Two times a day (BID) | ORAL | 0 refills | Status: DC

## 2020-04-09 NOTE — Progress Notes (Signed)
Acute Office Visit  Subjective:    Patient ID: [name], female    DOB: 1950/07/04, 70 y.o.   MRN: [id number]  Chief Complaint  Patient presents with  . Blister    HPI Patient is in today for redness swelling and pustules on her middle right finger. She says it been there for several days.  She has had a history of herpes simplex on one of the fingers on her left hand but she is never had it on her right hand before but she does wonder if it could be herpes.  She says she noticed it initially because it just felt really sore and tight.  Now it is red, raised and has what looks like a pustule in the center.  She said infected actually had a couple of vesicles 1 of which ruptured and she saw a coral colored fluid come out of it. No trauma.    She also had some questions about some supplements that she would like to start including magnesium Ashwaganda, calcium supplement, even though she is already taking a different calcium supplement but she is getting ready to run out of it she wanted to know the appropriate dose for calcium supplementation.  He had also picked up in [place] for immune protection since she will be traveling next week.  Reports that she is tolerating the atorvastatin well so far and wants to know when she should have her l lipids rechecked.  Past Medical History:  Diagnosis Date  . ADD (attention deficit disorder)   . Arthritis    "bunion on  right" (08/11/2014)  . Chronic bronchitis ([place])    "plenty; but not q yr" (08/11/2014)  . Depression   . Dyslipidemia   . Environmental allergies   . GERD (gastroesophageal reflux disease)   . Hearing difficulty of left ear    "grew up w/deaf parent; find myself lip reading more recently" (08/11/2014)  . Hepatitis    "exposed in college; rec'd gamma globulin; 6 months later S/S (weakness, lethargy, fevers); ; dx'd infectious hepatitis"  . History of herpes genitalis    "stress induced reoccurance that shows up  on my left middle finger" (08/11/2014)  . History of hiatal hernia   . History of stomach ulcers   . Hypercholesterolemia   . Hyperthyroidism 1994   "postpartum only; resolved itself"  . Incisional hernia   . Internal hemorrhoids   . Leg cramping   . Osteopenia   . Seasonal asthma   . Stress incontinence, female   . Ulcer     Past Surgical History:  Procedure Laterality Date  . CESAREAN SECTION  1994  . CHALAZION EXCISION Left ~ 2013  . DILATION AND CURETTAGE OF UTERUS  X 2  . EYE SURGERY    . HERNIA REPAIR  08/11/2014  . INCISIONAL HERNIA REPAIR N/A 08/11/2014   Procedure: LAPAROSCOPIC INCISIONAL HERNIA REPAIR;  Surgeon: Coralie Keens, MD;  Location: [facility];  Service: General;  Laterality: N/A;  . INCONTINENCE SURGERY  2005  . INGUINAL HERNIA REPAIR Left 08/11/2014  . INGUINAL HERNIA REPAIR Left 08/11/2014   Procedure: LEFT INGUINAL HERNIA REPAIR;  Surgeon: Coralie Keens, MD;  Location: [facility];  Service: General;  Laterality: Left;  . INSERTION OF MESH Left 08/11/2014   Procedure: INSERTION OF MESH TO LEFT GROIN AND ABDOMEN;  Surgeon: Coralie Keens, MD;  Location: [facility];  Service: General;  Laterality: Left;  . LAPAROSCOPIC INCISIONAL / UMBILICAL / VENTRAL HERNIA REPAIR  08/11/2014  [place] EXCISION  2014  . REPAIR OF PERFORATED ULCER  1985  . TONSILLECTOMY    . TOTAL SHOULDER ARTHROPLASTY Left 09/21/2017   Procedure: LEFT TOTAL SHOULDER ARTHROPLASTY;  Surgeon: Nicholes Stairs, MD;  Location: [facility];  Service: Orthopedics;  Laterality: Left;    Family History  Problem Relation Age of Onset  . Arthritis Mother   . Heart disease Mother        CABG @ 60  . Diabetes Mother   . Osteoporosis Mother   . Hyperlipidemia Mother   . Arthritis Father   . Lymphoma Father 72       chemo  . Cancer Father        lymphoma  . Lymphoma Paternal Grandmother   . Diabetes Maternal Grandmother   . Colon cancer Neg Hx   . Rectal cancer Neg Hx   . Esophageal cancer Neg  Hx     Social History   Socioeconomic History  . Marital status: Single    Spouse name: Not on file  . Number of children: 1  . Years of education: 80  . Highest education level: Bachelor's degree (e.g., BA, AB, BS)  Occupational History  . Occupation: Consulting civil engineer    Comment: Full time; Barista  Tobacco Use  . Smoking status: Former Smoker    Packs/day: 0.00    Types: Cigarettes  . Smokeless tobacco: Never Used  . Tobacco comment: Social Smoker in her 67's  Vaping Use  . Vaping Use: Never used  Substance and Sexual Activity  . Alcohol use: Yes    Alcohol/week: 2.0 - 4.0 standard drinks    Types: 2 - 4 Standard drinks or equivalent per week  . Drug use: No  . Sexual activity: Not Currently    Birth control/protection: Post-menopausal  Other Topics Concern  . Not on file  Social History Narrative   Marital status: divorced; not dating      Children: 1 daughter; no grandchildren      Lives:  In senior living apartment      Employment: Optometrist      Tobacco: used only socially in early 20's- never a regular user      Alcohol: 3-4 drinks a week- spread out      Drugs: never      Exercise: stretching exercises every day 15-74min- no vigorous activity   Social Determinants of Health   Financial Resource Strain: Low Risk   . Difficulty of Paying Living Expenses: Not hard at all  Food Insecurity: No Food Insecurity  . Worried About Charity fundraiser in the Last Year: Never true  . Ran Out of Food in the Last Year: Never true  Transportation Needs: No Transportation Needs  . Lack of Transportation (Medical): No  . Lack of Transportation (Non-Medical): No  Physical Activity: Inactive  . Days of Exercise per Week: 0 days  . Minutes of Exercise per Session: 0 min  Stress: No Stress Concern Present  . Feeling of Stress : Not at all  Social Connections: Moderately Integrated  . Frequency of Communication with Friends and Family: More  than three times a week  . Frequency of Social Gatherings with Friends and Family: More than three times a week  . Attends Religious Services: More than 4 times per year  . Active Member of Clubs or Organizations: Yes  . Attends Archivist Meetings: More than 4 times per year  . Marital Status:  Divorced  Intimate Partner Violence: Not At Risk  . Fear of Current or Ex-Partner: No  . Emotionally Abused: No  . Physically Abused: No  . Sexually Abused: No    Outpatient Medications Prior to Visit  Medication Sig Dispense Refill  . acetaminophen (TYLENOL) 650 MG CR tablet Take 1,300 mg by mouth See admin instructions. Take 1,300 mg by mouth at bedtime and may also take an additional 1,300 mg one to two times a day as needed for pain    . amphetamine-dextroamphetamine (ADDERALL XR) 25 MG 24 hr capsule Take 1 capsule by mouth every morning. 30 capsule 0  . budesonide-formoterol (SYMBICORT) 160-4.5 MCG/ACT inhaler Inhale 2 puffs into the lungs daily. 9 g 1  . CALCIUM PO Take by mouth.    . cyclobenzaprine (FLEXERIL) 10 MG tablet Take 1 tablet (10 mg total) by mouth at bedtime as needed (for sleep or pain). 90 tablet 1  . DULoxetine (CYMBALTA) 30 MG capsule TAKE 2 CAPSULES IN THE MORNING  AND TAKE 1 CAPSULE IN THE EVENING 270 capsule 1  . Ferrous Sulfate Dried (FERROUS SULFATE IRON) 200 (65 Fe) MG TABS Take by mouth.    . loratadine (CLARITIN) 10 MG tablet Take 1 tablet (10 mg total) by mouth daily. 90 tablet 3  . Multiple Vitamins-Minerals (ONE-A-DAY WOMENS 50 PLUS) TABS Take 1 tablet by mouth daily.    Marland Kitchen oxybutynin (DITROPAN-XL) 10 MG 24 hr tablet oxybutynin chloride ER 10 mg tablet,extended release 24 hr  TAKE 1 TABLET BY MOUTH EVERY DAY    . Turmeric Curcumin 500 MG CAPS Take 1,500 mg by mouth daily.     . Vibegron (GEMTESA) 75 MG TABS Take by mouth.    Marland Kitchen atorvastatin (LIPITOR) 40 MG tablet Take 1 tablet (40 mg total) by mouth at bedtime. 30 tablet 0   No facility-administered  medications prior to visit.    Allergies  Allergen Reactions  . Aspirin Other (See Comments)    GI Intolerance; History of ulcers; GI BLEED  . Bee Venom Swelling    Any insects that bite or stings- swelling at site of sting or cellulitis can develop  . Nsaids Other (See Comments)    History of ulcers  . Vesicare [Solifenacin] Itching    Review of Systems     Objective:    Physical Exam Vitals reviewed.  Constitutional:      Appearance: She is well-developed.  HENT:     Head: Normocephalic and atraumatic.  Eyes:     Conjunctiva/sclera: Conjunctivae normal.  Cardiovascular:     Rate and Rhythm: Normal rate.  Pulmonary:     Effort: Pulmonary effort is normal.  Skin:    General: Skin is dry.     Coloration: Skin is not pale.     Comments: Please see photos that she sent through my chart.  Area of erythema from the DIP to the nailbed that is swollen and erythematous.  There is what looks like a vesicle in the middle.  Though she says originally it was a couple of vesicles that seem to have coalesced.  Neurological:     Mental Status: She is alert and oriented to person, place, and time.  Psychiatric:        Behavior: Behavior normal.     BP 136/83   Pulse 83   Ht 5\' 2"  (1.575 m)   Wt 179 lb (81.2 kg)   LMP 08/30/2008   SpO2 99%   BMI 32.74 kg/m  Wt Readings from  Last 3 Encounters:  04/09/20 179 lb (81.2 kg)  04/01/20 179 lb 11.2 oz (81.5 kg)  03/05/20 183 lb 9.6 oz (83.3 kg)    There are no preventive care reminders to display for this patient.  There are no preventive care reminders to display for this patient.   Lab Results  Component Value Date   TSH 1.73 09/25/2018   Lab Results  Component Value Date   WBC 6.2 10/21/2019   HGB 10.4 (L) 10/21/2019   HCT 34.2 (L) 10/21/2019   MCV 72.6 (L) 10/21/2019   PLT 399 10/21/2019   Lab Results  Component Value Date   NA 141 10/21/2019   K 5.2 10/21/2019   CO2 27 10/21/2019   GLUCOSE 108 (H) 10/21/2019    BUN 16 10/21/2019   CREATININE 0.74 10/21/2019   BILITOT 0.6 10/21/2019   ALKPHOS 104 07/02/2017   AST 23 10/21/2019   ALT 21 10/21/2019   PROT 6.6 10/21/2019   ALBUMIN 4.3 07/02/2017   CALCIUM 9.6 10/21/2019   ANIONGAP 7 09/22/2017   GFR 74.97 03/19/2013   Lab Results  Component Value Date   CHOL 212 (H) 10/21/2019   Lab Results  Component Value Date   HDL 57 10/21/2019   Lab Results  Component Value Date   LDLCALC 134 (H) 10/21/2019   Lab Results  Component Value Date   TRIG 105 10/21/2019   Lab Results  Component Value Date   CHOLHDL 3.7 10/21/2019   Lab Results  Component Value Date   HGBA1C 6.0 (A) 07/07/2019       Assessment & Plan:   Problem List Items Addressed This Visit      Other   Dyslipidemia    We discussed that when she has been on the statin for about 12 weeks we will recheck her lipids and liver enzymes to make sure that everything looks great if she has any problems she can always reach out sooner.  She would like to go ahead and switch this prescription to a 90-day supply.      Relevant Medications   atorvastatin (LIPITOR) 40 MG tablet    Other Visit Diagnoses    Open wound of finger, initial encounter    -  Primary   Relevant Orders   Wound culture   Varicella Zoster,Rapid Method,Culture     Wound with vesicles - skin cleaned and sterile blade used to nick the skin.  Area swabbed for viral culture and wound culture. No pus present. Will start doxycycline over the weekend to cover for bacterial infection in the meantime will evaluate for herpes simplex though I did let her know that the result would likely not be back for about a week.  Did look over her supplements and they should be fine with what she is currently taking.  Meds ordered this encounter  Medications  . doxycycline (VIBRA-TABS) 100 MG tablet    Sig: Take 1 tablet (100 mg total) by mouth 2 (two) times daily.    Dispense:  14 tablet    Refill:  0  . atorvastatin  (LIPITOR) 40 MG tablet    Sig: Take 1 tablet (40 mg total) by mouth at bedtime.    Dispense:  90 tablet    Refill:  3     Beatrice Lecher, MD

## 2020-04-09 NOTE — Telephone Encounter (Signed)
Patient said she wanted to know if PCP can call her for a 5 minute visit or facetime call. I advised patient to do that 3pm with Caleen Jobs, DNP. Patient said no I dont want to start with someone new. Per [employer] patient was advised to go to UC. Patient said she didn't want to do that and it was unnecessary. She wanted me to send this message to [place].

## 2020-04-09 NOTE — Telephone Encounter (Signed)
It looks like it probably needs to be lanced.  Unfortunately we do not have anything today.  Certainly if someone cancels we can try to get her in before the weekend.  Otherwise would recommend urgent care treatment.

## 2020-04-09 NOTE — Assessment & Plan Note (Signed)
We discussed that when she has been on the statin for about 12 weeks we will recheck her lipids and liver enzymes to make sure that everything looks great if she has any problems she can always reach out sooner.  She would like to go ahead and switch this prescription to a 90-day supply.

## 2020-04-13 LAB — WOUND CULTURE
GRAM STAIN:: NONE SEEN
MICRO NUMBER:: 11640775
RESULT:: NO GROWTH
SPECIMEN QUALITY:: ADEQUATE

## 2020-04-13 LAB — VARICELLA ZOSTER VIRUS,RAPID METHOD,CULTURE

## 2020-04-14 ENCOUNTER — Other Ambulatory Visit: Payer: Self-pay | Admitting: Family Medicine

## 2020-04-16 ENCOUNTER — Encounter: Payer: Self-pay | Admitting: Family Medicine

## 2020-04-19 NOTE — Telephone Encounter (Signed)
OK, make sure she is taking the antibiotic I sent in for her

## 2020-04-29 ENCOUNTER — Ambulatory Visit: Payer: Medicare HMO | Admitting: Family Medicine

## 2020-04-29 ENCOUNTER — Encounter: Payer: Self-pay | Admitting: Family Medicine

## 2020-04-29 ENCOUNTER — Telehealth (INDEPENDENT_AMBULATORY_CARE_PROVIDER_SITE_OTHER): Payer: Medicare HMO | Admitting: Family Medicine

## 2020-04-29 ENCOUNTER — Other Ambulatory Visit: Payer: Self-pay | Admitting: *Deleted

## 2020-04-29 DIAGNOSIS — S61209A Unspecified open wound of unspecified finger without damage to nail, initial encounter: Secondary | ICD-10-CM

## 2020-04-29 DIAGNOSIS — J019 Acute sinusitis, unspecified: Secondary | ICD-10-CM

## 2020-04-29 MED ORDER — SULFAMETHOXAZOLE-TRIMETHOPRIM 800-160 MG PO TABS: 1.0000 | ORAL_TABLET | Freq: Two times a day (BID) | ORAL | 0 refills | Status: DC

## 2020-04-29 NOTE — Progress Notes (Addendum)
Virtual Visit via Telephone Note  I connected with [name] on 04/29/20 at 10:10 AM EDT by telephone and verified that I am speaking with the correct person using two identifiers.   I discussed the limitations, risks, security and privacy concerns of performing an evaluation and management service by telephone and the availability of in person appointments. I also discussed with the patient that there may be a patient responsible charge related to this service. The patient expressed understanding and agreed to proceed.  Patient location: at home  Provider loccation: In office   Subjective:    CC: not feeling well.   HPI: 3RD finger R hand. We had recently treated with doxy and it was getting much better but now it is swollen again.  She stated that while she was traveling the did hit the finger and it did bleed some. She hasn't had any more bleeding or discharge from the finger since. She has been using an antibiotic cream and bandage on the finger.   Pt stated that she feels that she got a cold from her recent traveling. Sxs started about 1.5 week ago.  She has nasal drainage that is yellow, a slight cough and a sore throat from post nasal drainage. Using tylenol and a saline rinse to help but no other OTC medications. Denies any f/s/c/n/v/d/body aches or headache. She took an at home COVID test on Tuesday upon returning home and this was negative.  she  Has been using a nasal wash.  Mild ST.  Raspy voice.  Hasn't used her inhaler.  Right maxillary sinus felt very clogged this AM. She is getting a thick green discharge.     Past medical history, Surgical history, Family history not pertinant except as noted below, Social history, Allergies, and medications have been entered into the medical record, reviewed, and corrections made.   Review of Systems: No fevers, chills, night sweats, weight loss, chest pain, or shortness of breath.   Objective:    General: Speaking clearly in  complete sentences without any shortness of breath.  Alert and oriented x3.  Normal judgment. No apparent acute distress.    Impression and Recommendations:    Acute sinusitis-it sounds like this might have initially been triggered by allergies though she was also doing a lot of traveling so picking up some type of viral illness is not unreasonable.  It is now been going on for about a week and a half in felt like she had temporary improvement but now it is worse again.  We discussed going ahead and treating for sinusitis at this point.  Continue with nasal saline irrigation if not feeling some better after the weekend then please let us know.  Open wound on middle finger-sounds like it was it did improve after the doxycycline but did not completely heal and after being bumped and traumatized a couple times it is now swollen and erythematous again.  Likely from local trauma but I am going to put her on an antibiotic for her sinuses will also cover for infection as well.  Continue to apply the mupirocin ointment and avoid injury and trauma and soaking it in water if possible.  Call if any changes or looks worse.    I discussed the assessment and treatment plan with the patient. The patient was provided an opportunity to ask questions and all were answered. The patient agreed with the plan and demonstrated an understanding of the instructions.   The patient was advised to call  back or seek an in-person evaluation if the symptoms worsen or if the condition fails to improve as anticipated.  I provided 25 minutes of non-face-to-face time during this encounter.   Beatrice Lecher, MD

## 2020-04-29 NOTE — Progress Notes (Signed)
3RD finger R hand. She stated that while she was traveling the did hit the finger and it did bleed some. She hasn't had any more bleeding or discharge from the finger since. She has been using an antibiotic cream and bandage on the finger.   Pt stated that she feels that she got a cold from her recent traveling. She has nasal drainage that is yellow, a slight cough and a sore throat from post nasal drainage. Using tylenol and a saline rinse to help but no other OTC medications. Denies any f/s/c/n/v/d/body aches or headache. She took an at home COVID test on Tuesday upon returning home and this was negative.

## 2020-04-29 NOTE — Telephone Encounter (Signed)
I called and scheduled patient for an appointment.

## 2020-05-06 ENCOUNTER — Encounter: Payer: Self-pay | Admitting: Family Medicine

## 2020-05-06 ENCOUNTER — Telehealth: Payer: Self-pay

## 2020-05-06 NOTE — Telephone Encounter (Signed)
[name] called and stated her cold symptoms have worsened since her recent appointment. States she has taken 4 Covid tests which have all come back negative. Pt would like to know what she should do next. Pt also sent a mychart message.

## 2020-05-07 MED ORDER — CEFDINIR 300 MG PO CAPS: 300.0000 mg | ORAL_CAPSULE | Freq: Two times a day (BID) | ORAL | 0 refills | Status: DC

## 2020-05-07 NOTE — Telephone Encounter (Signed)
rx sent for new antibiotic.  Please see if she has any new sxs besides fever?  Worsening cough,etc. If she had 4 neg COVID test she doesn't have COVID

## 2020-05-11 DIAGNOSIS — N3946 Mixed incontinence: Secondary | ICD-10-CM | POA: Diagnosis not present

## 2020-05-11 DIAGNOSIS — M25561 Pain in right knee: Secondary | ICD-10-CM | POA: Diagnosis not present

## 2020-05-11 DIAGNOSIS — N3941 Urge incontinence: Secondary | ICD-10-CM | POA: Diagnosis not present

## 2020-05-11 DIAGNOSIS — M25562 Pain in left knee: Secondary | ICD-10-CM | POA: Diagnosis not present

## 2020-05-11 DIAGNOSIS — R350 Frequency of micturition: Secondary | ICD-10-CM | POA: Diagnosis not present

## 2020-05-19 ENCOUNTER — Other Ambulatory Visit: Payer: Self-pay | Admitting: Family Medicine

## 2020-05-19 DIAGNOSIS — F901 Attention-deficit hyperactivity disorder, predominantly hyperactive type: Secondary | ICD-10-CM

## 2020-05-20 MED ORDER — AMPHETAMINE-DEXTROAMPHET ER 25 MG PO CP24
25.0000 mg | ORAL_CAPSULE | ORAL | 0 refills | Status: DC
Start: 2020-05-20 — End: 2020-06-25

## 2020-06-02 ENCOUNTER — Encounter: Payer: Self-pay | Admitting: Family Medicine

## 2020-06-04 NOTE — Telephone Encounter (Signed)
Yes, booster is the same as the first doses.  So in our system it just counts that as the additional vaccine.  It is showing overdue because you are due for a second booster.  I think doing the Levan Hurst would be a reasonable option for a booster as it is slightly different and could possibly add some additional protection.   I think we will keep an eye on the productive cough.  It can sometimes take a couple of months to get over an acute infection.  But if at any point you feel you are getting worse you can come in and we can take a listen and look.  Safe travels.

## 2020-06-05 ENCOUNTER — Encounter (INDEPENDENT_AMBULATORY_CARE_PROVIDER_SITE_OTHER): Payer: Self-pay

## 2020-06-08 DIAGNOSIS — M5416 Radiculopathy, lumbar region: Secondary | ICD-10-CM | POA: Diagnosis not present

## 2020-06-22 ENCOUNTER — Telehealth: Payer: Self-pay | Admitting: Family Medicine

## 2020-06-22 NOTE — Chronic Care Management (AMB) (Signed)
  Chronic Care Management   Note  06/22/2020 Name: [name] MRN: [id number] DOB: Jul 03, 1950  [name] is a 70 y.o. year old female who is a primary care patient of Madilyn Fireman, Rene Kocher, MD. I reached out to [name] by phone today in response to a referral sent by Ms. Holli Humbles Harpenau's PCP, Hali Marry, MD.   Ms. Connors was given information about Chronic Care Management services today including:  1. CCM service includes personalized support from designated clinical staff supervised by her physician, including individualized plan of care and coordination with other care providers 2. 24/7 contact phone numbers for assistance for urgent and routine care needs. 3. Service will only be billed when office clinical staff spend 20 minutes or more in a month to coordinate care. 4. Only one practitioner may furnish and bill the service in a calendar month. 5. The patient may stop CCM services at any time (effective at the end of the month) by phone call to the office staff.   Patient agreed to services and verbal consent obtained.   Follow up plan:   Lauretta Grill [employer] Scheduler

## 2020-06-24 DIAGNOSIS — R2689 Other abnormalities of gait and mobility: Secondary | ICD-10-CM | POA: Diagnosis not present

## 2020-06-25 ENCOUNTER — Other Ambulatory Visit: Payer: Self-pay | Admitting: Family Medicine

## 2020-06-25 ENCOUNTER — Encounter: Payer: Self-pay | Admitting: Family Medicine

## 2020-06-25 ENCOUNTER — Other Ambulatory Visit: Payer: Self-pay | Admitting: *Deleted

## 2020-06-25 DIAGNOSIS — F901 Attention-deficit hyperactivity disorder, predominantly hyperactive type: Secondary | ICD-10-CM

## 2020-06-25 MED ORDER — AMPHETAMINE-DEXTROAMPHET ER 25 MG PO CP24: 25.0000 mg | ORAL_CAPSULE | ORAL | 0 refills | Status: DC

## 2020-07-05 ENCOUNTER — Ambulatory Visit (INDEPENDENT_AMBULATORY_CARE_PROVIDER_SITE_OTHER): Payer: Medicare HMO | Admitting: Rehabilitative and Restorative Service Providers"

## 2020-07-05 ENCOUNTER — Other Ambulatory Visit: Payer: Self-pay

## 2020-07-05 DIAGNOSIS — R2681 Unsteadiness on feet: Secondary | ICD-10-CM | POA: Diagnosis not present

## 2020-07-05 DIAGNOSIS — R2689 Other abnormalities of gait and mobility: Secondary | ICD-10-CM | POA: Diagnosis not present

## 2020-07-05 DIAGNOSIS — M6281 Muscle weakness (generalized): Secondary | ICD-10-CM | POA: Diagnosis not present

## 2020-07-05 NOTE — Patient Instructions (Signed)
Access Code: [email or web address] URL: [email or web address] Date: 07/05/2020 Prepared by: Rudell Cobb  Exercises Sit to Stand - 2 x daily - 7 x weekly - 1 sets - 10 reps Single Leg Stance with Support - 2 x daily - 7 x weekly - 1 sets - 3 reps - 10-15 seconds hold Heel Toe Raises with Counter Support - 2 x daily - 7 x weekly - 1 sets - 20 reps

## 2020-07-05 NOTE — Therapy (Signed)
[place], [place], [address] Phone: [phone]   Fax:  [phone]  Physical Therapy Evaluation  Patient Details  Name: [name] MRN: [id number] Date of Birth: 26-Sep-1950 Referring Provider (PT): Levy Pupa, [place]   Encounter Date: 07/05/2020   PT End of Session - 07/05/20 1247    Visit Number 1    Number of Visits 12    Date for PT Re-Evaluation 08/16/20    Authorization Type [employer]-- requested 12 visits    Authorization - Number of Visits 12    Progress Note Due on Visit 10    PT Start Time 1150    PT Stop Time 1240    PT Time Calculation (min) 50 min    Activity Tolerance Patient tolerated treatment well    Behavior During Therapy [facility] for tasks assessed/performed           Past Medical History:  Diagnosis Date  . ADD (attention deficit disorder)   . Arthritis    "bunion on  right" (08/11/2014)  . Chronic bronchitis ([place])    "plenty; but not q yr" (08/11/2014)  . Depression   . Dyslipidemia   . Environmental allergies   . GERD (gastroesophageal reflux disease)   . Hearing difficulty of left ear    "grew up w/deaf parent; find myself lip reading more recently" (08/11/2014)  . Hepatitis    "exposed in college; rec'd gamma globulin; 6 months later S/S (weakness, lethargy, fevers); ; dx'd infectious hepatitis"  . History of herpes genitalis    "stress induced reoccurance that shows up on my left middle finger" (08/11/2014)  . History of hiatal hernia   . History of stomach ulcers   . Hypercholesterolemia   . Hyperthyroidism 1994   "postpartum only; resolved itself"  . Incisional hernia   . Internal hemorrhoids   . Leg cramping   . Osteopenia   . Seasonal asthma   . Stress incontinence, female   . Ulcer     Past Surgical History:  Procedure Laterality Date  . CESAREAN SECTION  1994  . CHALAZION EXCISION Left ~ 2013  . DILATION AND CURETTAGE OF UTERUS  X 2  . EYE  SURGERY    . HERNIA REPAIR  08/11/2014  . INCISIONAL HERNIA REPAIR N/A 08/11/2014   Procedure: LAPAROSCOPIC INCISIONAL HERNIA REPAIR;  Surgeon: Coralie Keens, MD;  Location: [place];  Service: General;  Laterality: N/A;  . INCONTINENCE SURGERY  2005  . INGUINAL HERNIA REPAIR Left 08/11/2014  . INGUINAL HERNIA REPAIR Left 08/11/2014   Procedure: LEFT INGUINAL HERNIA REPAIR;  Surgeon: Coralie Keens, MD;  Location: [place];  Service: General;  Laterality: Left;  . INSERTION OF MESH Left 08/11/2014   Procedure: INSERTION OF MESH TO LEFT GROIN AND ABDOMEN;  Surgeon: Coralie Keens, MD;  Location: [place];  Service: General;  Laterality: Left;  . LAPAROSCOPIC INCISIONAL / UMBILICAL / VENTRAL HERNIA REPAIR  08/11/2014   IHR  . NASAL POLYP EXCISION  2014  . REPAIR OF PERFORATED ULCER  1985  . TONSILLECTOMY    . TOTAL SHOULDER ARTHROPLASTY Left 09/21/2017   Procedure: LEFT TOTAL SHOULDER ARTHROPLASTY;  Surgeon: Nicholes Stairs, MD;  Location: [place];  Service: Orthopedics;  Laterality: Left;    There were no vitals filed for this visit.    Subjective Assessment - 07/05/20 1156    Subjective The patient is a prior patient at our clinic from a L shoulder surgery.  She returns  today reporting difficulties with balance.  She c/o foot issues with 3 minor procedures, she has leg length discrepency, and scoliosis.  She reports she scuffs her R toes at times and does have a h/o falling from tripping.    Pertinent History R foot bunionectomy, 2nd toe on R side s/p surgery,  chronic R hip pain (she gets steroid injections), overactive bladder,    Patient Stated Goals If I can stand on one leg at a time without holding, I would think that is miraculous; Be able to get up from the floor and stand up without having to put so much weight on her knees;    Currently in Pain? Yes    Pain Location Hip    Pain Descriptors / Indicators Sore    Pain Type Chronic pain    Pain Onset More than a month ago    Pain  Frequency Intermittent    Aggravating Factors  pain can vary due to arthritis              [facility] PT Assessment - 07/05/20 1205      Assessment   Medical Diagnosis other abnormalities of gait    Referring Provider (PT) Levy Pupa, PA    Onset Date/Surgical Date 06/24/20    Hand Dominance Right      Precautions   Precautions Fall      Restrictions   Weight Bearing Restrictions No      Balance Screen   Has the patient fallen in the past 6 months Yes    How many times? 2 times (once on a train, once on steps)  Has to do one foot per step    Has the patient had a decrease in activity level because of a fear of falling?  No    Is the patient reluctant to leave their home because of a fear of falling?  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      Observation/Other Assessments   Focus on Therapeutic Outcomes (FOTO)  Not performed-- excluded condition      Sensation   Light Touch Appears Intact      Posture/Postural Control   Posture/Postural Control Postural limitations    Posture Comments scoliosis with L trunk shortening      ROM / Strength   AROM / PROM / Strength AROM;Strength      AROM   Overall AROM  Within functional limits for tasks performed    Overall AROM Comments except first MTP limited due to prior surgery      Strength   Overall Strength Deficits    Overall Strength Comments note lateral hip weakness through trendelenberg gait    Strength Assessment Site Hip;Knee;Ankle    Right/Left Hip Right;Left    Right Hip Flexion 4/5    Left Hip  Flexion 4/5    Right/Left Knee Right;Left    Right Knee Flexion 5/5    Right Knee Extension 5/5    Left Knee  Flexion 5/5    Left Knee Extension 5/5    Right/Left Ankle Right;Left    Right Ankle Dorsiflexion 4+/5    Left Ankle Dorsiflexion 5/5      Flexibility   Soft Tissue Assessment /Muscle Length yes    Hamstrings good hamstring length bilaterally      Ambulation/Gait   Ambulation/Gait Yes    Ambulation/Gait Assistance 7: Independent    Ambulation Distance (Feet) 100 Feet    Assistive device None    Gait Pattern Step-through pattern;Narrow base of support;Trendelenburg    Ambulation Surface Level;Indoor    Gait velocity 3.21 ft/sec    Stairs Yes    Stairs Assistance 7: Independent   can negotiate 2 steps without rails     Standardized Balance Assessment   Standardized Balance Assessment Berg Balance Test      Berg Balance Test   Sit to Stand Able to stand without using hands and stabilize independently    Standing Unsupported Able to stand safely 2 minutes    Sitting with Back Unsupported but Feet Supported on Floor or Stool Able to sit safely and securely 2 minutes    Stand to Sit Sits safely with minimal use of hands    Transfers Able to transfer safely, minor use of hands    Standing Unsupported with Eyes Closed Able to stand 10 seconds safely    Standing Unsupported with Feet Together Able to place feet together independently and stand 1 minute safely    From Standing, Reach Forward with Outstretched Arm Can reach forward >12 cm safely (5")    From Standing Position, Pick up Object from Floor Able to pick up shoe safely and easily    From Standing Position, Turn to Look Behind Over each Shoulder Looks behind from both sides and weight shifts well    Turn 360 Degrees Able to turn 360 degrees safely in 4 seconds or less    Standing Unsupported, Alternately Place Feet on Step/Stool Able to stand independently and safely and complete 8 steps in 20 seconds    Standing  Unsupported, One Foot in Front Able to place foot tandem independently and hold 30 seconds    Standing on One Leg Able to lift leg independently and hold 5-10 seconds    Total Score 54    Berg comment: 54/56      Functional Gait  Assessment   Gait assessed  Yes    Gait Level Surface Walks 20 ft in less than 5.5 sec, no assistive devices, good speed, no evidence for imbalance, normal gait pattern, deviates no more than 6 in outside of the 12 in walkway width.    Change in Gait Speed Able to smoothly change walking speed without loss of balance or gait deviation. Deviate no more than 6 in outside of the 12 in walkway width.    Gait with Horizontal Head Turns Performs head turns with moderate changes in gait velocity, slows down, deviates 10-15 in outside 12 in walkway width but recovers, can continue to walk.    Gait with Vertical Head Turns Performs head turns with no change in gait. Deviates no more than 6 in outside 12 in walkway width.    Gait and Pivot Turn Pivot turns safely within 3 sec and stops quickly with no loss of balance.    Step Over Obstacle Is able to step over 2 stacked shoe boxes taped together (9 in total height) without changing gait speed. No evidence of imbalance.    Gait  with Narrow Base of Support Ambulates 4-7 steps.    Gait with Eyes Closed Walks 20 ft, uses assistive device, slower speed, mild gait deviations, deviates 6-10 in outside 12 in walkway width. Ambulates 20 ft in less than 9 sec but greater than 7 sec.    Ambulating Backwards Walks 20 ft, uses assistive device, slower speed, mild gait deviations, deviates 6-10 in outside 12 in walkway width.    Steps Alternating feet, must use rail.    Total Score 23    FGA comment: 23/30                      Objective measurements completed on examination: See above findings.       [facility] Adult PT Treatment/Exercise - 07/05/20 1205      Exercises   Exercises Other Exercises    Other Exercises  standing  gastroc stretch, sit<>stand x 10 reps, single leg stance, and ankle DF performed.                  PT Education - 07/05/20 1246    Education Details HEP    Person(s) Educated Patient    Methods Explanation;Demonstration;Handout    Comprehension Verbalized understanding;Returned demonstration               PT Long Term Goals - 07/05/20 1304      PT LONG TERM GOAL #1   Title The patient will return demo HEP for LE strengthening, balance.    Time 6    Period Weeks    Target Date 08/16/20      PT LONG TERM GOAL #2   Title The patient will improve functional gait assessment from 23/30 up to 27/30.    Time 6    Period Weeks    Target Date 08/16/20      PT LONG TERM GOAL #3   Title The patient will maintain single limb stance for 10 seconds R and L sides to demo improved stability.    Time 6    Period Weeks    Target Date 08/16/20      PT LONG TERM GOAL #4   Title The patient will negotiate 4 steps with reciprocal pattern ascending and descending.    Time 6    Period Weeks    Target Date 08/16/20                  Plan - 07/05/20 1307    Clinical Impression Statement The patient is a 70 yo female presenting to OP physical therapy with imbalance, R foot tripping, and unsteadiness.  She presents with dec'd bilateral hip flexor strength, dec'd single limb stance, dec'd dynamic gait, and dec'd high level balance.  PT to address deficits to optimize current functional status.    Personal Factors and Comorbidities Comorbidity 3+    Comorbidities chronic h/o R hip pain, multiple R foot surgeries, scoliosis    Examination-Activity Limitations Squat;Locomotion Level;Stairs    Examination-Participation Restrictions Community Activity    Stability/Clinical Decision Making Stable/Uncomplicated    Clinical Decision Making Low    Rehab Potential Good    PT Frequency 2x / week    PT Duration 6 weeks    PT Treatment/Interventions Patient/family education;ADLs/Self Care  Home Management;Gait training;Stair training;Neuromuscular re-education;Balance training;Therapeutic exercise;Therapeutic activities;Functional mobility training;Manual techniques;Taping    PT Next Visit Plan Add further HEP focusing on hip flexor strengthening, glut activation (backwards walking), R ankle DF strength, standing balance, dynamic gait.  PT Home Exercise Plan Access Code: [email or web address]    Consulted and Agree with Plan of Care Patient           Patient will benefit from skilled therapeutic intervention in order to improve the following deficits and impairments:  Decreased balance,Postural dysfunction,Decreased strength,Abnormal gait  Visit Diagnosis: Other abnormalities of gait and mobility  Muscle weakness (generalized)  Unsteadiness on feet     Problem List Patient Active Problem List   Diagnosis Date Noted  . Iron deficiency 04/01/2020  . Primary insomnia 08/22/2019  . Obsessional thoughts 08/22/2019  . Chronic constipation 07/07/2019  . Leg cramps 07/07/2019  . Dyspepsia 05/15/2019  . Degeneration of lumbar intervertebral disc 11/28/2018  . Non-seasonal allergic rhinitis 10/03/2018  . OAB (overactive bladder) 07/18/2018  . Hair loss 07/18/2018  . Localized, primary osteoarthritis of shoulder region 04/18/2018  . Low back pain 03/06/2018  . Chronic musculoskeletal pain 11/13/2017  . 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  . IFG (impaired fasting glucose) 08/03/2017  . Primary osteoarthritis of both knees 07/29/2017  . Osteoarthritis, localized, shoulder, left 07/29/2017  . Sinusitis, chronic   . Asthma 01/08/2012  . Dyslipidemia   . ADD (attention deficit disorder)   . Depression   . Osteopenia   . Incisional hernia   . Stress incontinence, female     Rudell Cobb, PT 07/05/2020, 1:11 PM  [facility] [place], [place], [address] Phone: [phone]   Fax:   [phone]  Name: COLIN NORMENT MRN: [id number] Date of Birth: [birth year]

## 2020-07-08 ENCOUNTER — Encounter: Payer: Medicare HMO | Admitting: Rehabilitative and Restorative Service Providers"

## 2020-07-09 ENCOUNTER — Ambulatory Visit: Payer: Medicare HMO | Admitting: Physical Therapy

## 2020-07-09 ENCOUNTER — Other Ambulatory Visit: Payer: Self-pay

## 2020-07-09 DIAGNOSIS — M6281 Muscle weakness (generalized): Secondary | ICD-10-CM

## 2020-07-09 DIAGNOSIS — R2681 Unsteadiness on feet: Secondary | ICD-10-CM | POA: Diagnosis not present

## 2020-07-09 DIAGNOSIS — R2689 Other abnormalities of gait and mobility: Secondary | ICD-10-CM

## 2020-07-09 NOTE — Therapy (Signed)
[place], [place], [address] Phone: [phone]   Fax:  [phone]  Physical Therapy Treatment  Patient Details  Name: [name] MRN: [id number] Date of Birth: [birth year] Referring Provider (PT): Levy Pupa, [place]   Encounter Date: 07/09/2020   PT End of Session - 07/09/20 1112     Visit Number 2    Number of Visits 12    Date for PT Re-Evaluation 08/16/20    Authorization Type [employer]-- requested 12 visits    Authorization - Visit Number 2    Authorization - Number of Visits 12    Progress Note Due on Visit 10    PT Start Time 1110   pt arrived late   PT Stop Time 1142    PT Time Calculation (min) 32 min    Activity Tolerance Patient tolerated treatment well    Behavior During Therapy [facility] for tasks assessed/performed             Past Medical History:  Diagnosis Date   ADD (attention deficit disorder)    Arthritis    "bunion on  right" (08/11/2014)   Chronic bronchitis ([place])    "plenty; but not q yr" (08/11/2014)   Depression    Dyslipidemia    Environmental allergies    GERD (gastroesophageal reflux disease)    Hearing difficulty of left ear    "grew up w/deaf parent; find myself lip reading more recently" (08/11/2014)   Hepatitis    "exposed in college; rec'd gamma globulin; 6 months later S/S (weakness, lethargy, fevers); ; dx'd infectious hepatitis"   History of herpes genitalis    "stress induced reoccurance that shows up on my left middle finger" (08/11/2014)   History of hiatal hernia    History of stomach ulcers    Hypercholesterolemia    Hyperthyroidism 1994   "postpartum only; resolved itself"   Incisional hernia    Internal hemorrhoids    Leg cramping    Osteopenia    Seasonal asthma    Stress incontinence, female    Ulcer     Past Surgical History:  Procedure Laterality Date   CESAREAN SECTION  1994   CHALAZION EXCISION Left ~ 2013   DILATION AND  CURETTAGE OF UTERUS  X 2   EYE SURGERY     HERNIA REPAIR  08/11/2014   INCISIONAL HERNIA REPAIR N/A 08/11/2014   Procedure: LAPAROSCOPIC INCISIONAL HERNIA REPAIR;  Surgeon: Coralie Keens, MD;  Location: [facility];  Service: General;  Laterality: N/A;   INCONTINENCE SURGERY  2005   INGUINAL HERNIA REPAIR Left 08/11/2014   INGUINAL HERNIA REPAIR Left 08/11/2014   Procedure: LEFT INGUINAL HERNIA REPAIR;  Surgeon: Coralie Keens, MD;  Location: [facility];  Service: General;  Laterality: Left;   INSERTION OF MESH Left 08/11/2014   Procedure: INSERTION OF MESH TO LEFT GROIN AND ABDOMEN;  Surgeon: Coralie Keens, MD;  Location: [facility];  Service: General;  Laterality: Left;   LAPAROSCOPIC INCISIONAL / UMBILICAL / [place]  08/11/2014   IHR   NASAL POLYP EXCISION  2014   REPAIR OF PERFORATED ULCER  1985   TONSILLECTOMY     TOTAL SHOULDER ARTHROPLASTY Left 09/21/2017   Procedure: LEFT TOTAL SHOULDER ARTHROPLASTY;  Surgeon: Nicholes Stairs, MD;  Location: [facility];  Service: Orthopedics;  Laterality: Left;    There were no vitals filed for this visit.   Subjective Assessment - 07/09/20 1115     Subjective Pt reports  her buttock pain returned last night; woke her up in the middle of the night.  She hasn't done her HEP yet.    Patient Stated Goals If I can stand on one leg at a time without holding, I would think that is miraculous; Be able to get up from the floor and stand up without having to put so much weight on her knees;    Currently in Pain? No/denies                [facility] PT Assessment - 07/09/20 0001       Assessment   Medical Diagnosis other abnormalities of gait    Referring Provider (PT) Levy Pupa, PA    Onset Date/Surgical Date 06/24/20    Hand Dominance Right                [facility] Adult PT Treatment/Exercise - 07/09/20 0001       Knee/Hip Exercises: Standing   Heel Raises Both;1 set;10 reps   and heel raises   Lateral Step Up Left;1 set;10 reps;Hand  Hold: 2;Step Height: 6";Right;5 reps    Forward Step Up Both;1 set;15 reps;Step Height: 6";Hand Hold: 2   reciprocal pattern forward/ retro step down   SLS Lt/Rt SLS with intermittent UE support x 10 sec, 15 sec, 15 sec      Knee/Hip Exercises: Seated   Sit to Sand 1 set;without UE support;10 reps   eccentric lowering            Balance Exercises - 07/09/20 0001       Balance Exercises: Standing   Stepping Strategy Posterior;Foam/compliant surface;5 reps    Rockerboard Anterior/posterior;Lateral;EO;30 seconds    Tandem Gait Forward;Retro;3 reps;Intermittent upper extremity support                    PT Long Term Goals - 07/05/20 1304       PT LONG TERM GOAL #1   Title The patient will return demo HEP for LE strengthening, balance.    Time 6    Period Weeks    Target Date 08/16/20      PT LONG TERM GOAL #2   Title The patient will improve functional gait assessment from 23/30 up to 27/30.    Time 6    Period Weeks    Target Date 08/16/20      PT LONG TERM GOAL #3   Title The patient will maintain single limb stance for 10 seconds R and L sides to demo improved stability.    Time 6    Period Weeks    Target Date 08/16/20      PT LONG TERM GOAL #4   Title The patient will negotiate 4 steps with reciprocal pattern ascending and descending.    Time 6    Period Weeks    Target Date 08/16/20                   Plan - 07/09/20 1145     Clinical Impression Statement Session shortened due to pt's late arrival (overslept).  She tolerated all exercises well.  Decreased balance noted with Rt SLS compared to Lt, however more difficulty ascending/descending steps with Lt knee.  Goals are ongoing.    Personal Factors and Comorbidities Comorbidity 3+    Comorbidities chronic h/o R hip pain, multiple R foot surgeries, scoliosis    Examination-Activity Limitations Squat;Locomotion Level;Stairs    Examination-Participation Restrictions Community Activity     Stability/Clinical Decision Making  Stable/Uncomplicated    Rehab Potential Good    PT Frequency 2x / week    PT Duration 6 weeks    PT Treatment/Interventions Patient/family education;ADLs/Self Care Home Management;Gait training;Stair training;Neuromuscular re-education;Balance training;Therapeutic exercise;Therapeutic activities;Functional mobility training;Manual techniques;Taping    PT Next Visit Plan Add further HEP focusing on hip flexor strengthening, glut activation (backwards walking), R ankle DF strength, standing balance, dynamic gait.  Consider adding Rt ankle strengthening exercises (eversion).    PT Home Exercise Plan Access Code: [email or web address]    Consulted and Agree with Plan of Care Patient             Patient will benefit from skilled therapeutic intervention in order to improve the following deficits and impairments:  Decreased balance, Postural dysfunction, Decreased strength, Abnormal gait  Visit Diagnosis: Other abnormalities of gait and mobility  Muscle weakness (generalized)  Unsteadiness on feet     Problem List Patient Active Problem List   Diagnosis Date Noted   Iron deficiency 04/01/2020   Primary insomnia 08/22/2019   Obsessional thoughts 08/22/2019   Chronic constipation 07/07/2019   Leg cramps 07/07/2019   Dyspepsia 05/15/2019   Degeneration of lumbar intervertebral disc 11/28/2018   Non-seasonal allergic rhinitis 10/03/2018   OAB (overactive bladder) 07/18/2018   Hair loss 07/18/2018   Localized, primary osteoarthritis of shoulder region 04/18/2018   Low back pain 03/06/2018   Chronic musculoskeletal pain 11/13/2017   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   IFG (impaired fasting glucose) 08/03/2017   Primary osteoarthritis of both knees 07/29/2017   Osteoarthritis, localized, shoulder, left 07/29/2017   Sinusitis, chronic    Asthma 01/08/2012   Dyslipidemia    ADD (attention deficit disorder)    Depression    Osteopenia     Incisional hernia    Stress incontinence, female    [name], PTA 07/09/20 11:48 AM  [place], [place], [address] Phone: [phone]   Fax:  [phone]  Name: BURNELL MATLIN MRN: [id number] Date of Birth: [birth year]

## 2020-07-13 ENCOUNTER — Other Ambulatory Visit: Payer: Self-pay

## 2020-07-13 ENCOUNTER — Ambulatory Visit: Payer: Medicare HMO | Admitting: Rehabilitative and Restorative Service Providers"

## 2020-07-13 DIAGNOSIS — R2681 Unsteadiness on feet: Secondary | ICD-10-CM

## 2020-07-13 DIAGNOSIS — M6281 Muscle weakness (generalized): Secondary | ICD-10-CM

## 2020-07-13 DIAGNOSIS — R2689 Other abnormalities of gait and mobility: Secondary | ICD-10-CM | POA: Diagnosis not present

## 2020-07-13 NOTE — Therapy (Signed)
[place], [place], [address] Phone: [phone]   Fax:  [phone]  Physical Therapy Treatment  Patient Details  Name: [name] MRN: [id number] Date of Birth: 05-07-1950 Referring Provider (PT): Levy Pupa, [place]   Encounter Date: 07/13/2020   PT End of Session - 07/13/20 1026     Visit Number 3    Number of Visits 12    Date for PT Re-Evaluation 08/16/20    Authorization Type [employer]-- requested 12 visits    Authorization - Visit Number 3    Authorization - Number of Visits 12    Progress Note Due on Visit 10    PT Start Time 1022    PT Stop Time 1100    PT Time Calculation (min) 38 min    Activity Tolerance Patient tolerated treatment well    Behavior During Therapy WFL for tasks assessed/performed             Past Medical History:  Diagnosis Date   ADD (attention deficit disorder)    Arthritis    "bunion on  right" (08/11/2014)   Chronic bronchitis ([place])    "plenty; but not q yr" (08/11/2014)   Depression    Dyslipidemia    Environmental allergies    GERD (gastroesophageal reflux disease)    Hearing difficulty of left ear    "grew up w/deaf parent; find myself lip reading more recently" (08/11/2014)   Hepatitis    "exposed in college; rec'd gamma globulin; 6 months later S/S (weakness, lethargy, fevers); ; dx'd infectious hepatitis"   History of herpes genitalis    "stress induced reoccurance that shows up on my left middle finger" (08/11/2014)   History of hiatal hernia    History of stomach ulcers    Hypercholesterolemia    Hyperthyroidism 1994   "postpartum only; resolved itself"   Incisional hernia    Internal hemorrhoids    Leg cramping    Osteopenia    Seasonal asthma    Stress incontinence, female    Ulcer     Past Surgical History:  Procedure Laterality Date   CESAREAN SECTION  1994   CHALAZION EXCISION Left ~ 2013   DILATION AND CURETTAGE OF  UTERUS  X 2   EYE SURGERY     HERNIA REPAIR  08/11/2014   INCISIONAL HERNIA REPAIR N/A 08/11/2014   Procedure: LAPAROSCOPIC INCISIONAL HERNIA REPAIR;  Surgeon: Coralie Keens, MD;  Location: [facility];  Service: General;  Laterality: N/A;   INCONTINENCE SURGERY  2005   INGUINAL HERNIA REPAIR Left 08/11/2014   INGUINAL HERNIA REPAIR Left 08/11/2014   Procedure: LEFT INGUINAL HERNIA REPAIR;  Surgeon: Coralie Keens, MD;  Location: [facility];  Service: General;  Laterality: Left;   INSERTION OF MESH Left 08/11/2014   Procedure: INSERTION OF MESH TO LEFT GROIN AND ABDOMEN;  Surgeon: Coralie Keens, MD;  Location: [facility];  Service: General;  Laterality: Left;   LAPAROSCOPIC INCISIONAL / UMBILICAL / [place]  08/11/2014   IHR   NASAL POLYP EXCISION  2014   REPAIR OF PERFORATED ULCER  1985   TONSILLECTOMY     TOTAL SHOULDER ARTHROPLASTY Left 09/21/2017   Procedure: LEFT TOTAL SHOULDER ARTHROPLASTY;  Surgeon: Nicholes Stairs, MD;  Location: [facility];  Service: Orthopedics;  Laterality: Left;    There were no vitals filed for this visit.   Subjective Assessment - 07/13/20 1025     Subjective The patient reports she had a  lost day yesterday b/c of timing on her medications.    Pertinent History R foot bunionectomy, 2nd toe on R side s/p surgery,  chronic R hip pain (she gets steroid injections), overactive bladder,    Patient Stated Goals If I can stand on one leg at a time without holding, I would think that is miraculous; Be able to get up from the floor and stand up without having to put so much weight on her knees;    Currently in Pain? No/denies                [facility] PT Assessment - 07/13/20 1027       Assessment   Medical Diagnosis other abnormalities of gait    Referring Provider (PT) Levy Pupa, PA    Onset Date/Surgical Date 06/24/20    Hand Dominance Right                           [facility] Adult PT Treatment/Exercise - 07/13/20 1027       Neuro  Re-ed    Neuro Re-ed Details  Compliant foam standing with eyes closed, feet narrow and head turns, rocker board ant/posterior with reactive balance strategies.  Sit<>stand on compliant surfaces.      Exercises   Exercises Lumbar;Knee/Hip      Lumbar Exercises: Quadruped   Straight Leg Raise 5 reps    Opposite Arm/Leg Raise Right arm/Left leg;Left arm/Right leg;5 reps      Knee/Hip Exercises: Aerobic   Nustep L5 x 3 minutes      Knee/Hip Exercises: Standing   Heel Raises Limitations heel walking    Hip Flexion Stengthening;Right;Left;10 reps    Hip Flexion Limitations marching with CGA    Hip Abduction Stengthening;Both;10 reps    Abduction Limitations sidestepping    Wall Squat 10 reps    SLS right and left x 10 seconds x 5 reps each side    Gait Training backwards walking x 20 feet x 3 reps    Other Standing Knee Exercises sit to stand on compliant foam      Knee/Hip Exercises: Supine   Bridges Strengthening;Both;10 reps    Bridges with Diona Foley Squeeze Strengthening;Both;10 reps   squeezing yoga block                   PT Education - 07/13/20 1057     Education Details HEP    Person(s) Educated Patient    Methods Explanation;Demonstration;Handout    Comprehension Verbalized understanding;Returned demonstration                 PT Long Term Goals - 07/05/20 1304       PT LONG TERM GOAL #1   Title The patient will return demo HEP for LE strengthening, balance.    Time 6    Period Weeks    Target Date 08/16/20      PT LONG TERM GOAL #2   Title The patient will improve functional gait assessment from 23/30 up to 27/30.    Time 6    Period Weeks    Target Date 08/16/20      PT LONG TERM GOAL #3   Title The patient will maintain single limb stance for 10 seconds R and L sides to demo improved stability.    Time 6    Period Weeks    Target Date 08/16/20      PT LONG TERM GOAL #4   Title  The patient will negotiate 4 steps with reciprocal pattern  ascending and descending.    Time 6    Period Weeks    Target Date 08/16/20                   Plan - 07/13/20 1257     Clinical Impression Statement The patient is tolerating progression of strengthening and balance activities.  SLS is improving with 8 seconds bilaterally today.  PT to continue to progress to LTGs.    PT Treatment/Interventions Patient/family education;ADLs/Self Care Home Management;Gait training;Stair training;Neuromuscular re-education;Balance training;Therapeutic exercise;Therapeutic activities;Functional mobility training;Manual techniques;Taping    PT Next Visit Plan Progress hip flexor/glut strengthening, R ankle DF strengthening, dynamic gait, dynamic balance.    PT Home Exercise Plan Access Code: [email or web address]    Consulted and Agree with Plan of Care Patient             Patient will benefit from skilled therapeutic intervention in order to improve the following deficits and impairments:     Visit Diagnosis: Other abnormalities of gait and mobility  Muscle weakness (generalized)  Unsteadiness on feet     Problem List Patient Active Problem List   Diagnosis Date Noted   Iron deficiency 04/01/2020   Primary insomnia 08/22/2019   Obsessional thoughts 08/22/2019   Chronic constipation 07/07/2019   Leg cramps 07/07/2019   Dyspepsia 05/15/2019   Degeneration of lumbar intervertebral disc 11/28/2018   Non-seasonal allergic rhinitis 10/03/2018   OAB (overactive bladder) 07/18/2018   Hair loss 07/18/2018   Localized, primary osteoarthritis of shoulder region 04/18/2018   Low back pain 03/06/2018   Chronic musculoskeletal pain 11/13/2017   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   IFG (impaired fasting glucose) 08/03/2017   Primary osteoarthritis of both knees 07/29/2017   Osteoarthritis, localized, shoulder, left 07/29/2017   Sinusitis, chronic    Asthma 01/08/2012   Dyslipidemia    ADD (attention deficit disorder)    Depression     Osteopenia    Incisional hernia    Stress incontinence, female     [place], PT 07/13/2020, 12:58 PM  [facility] [place] [address] [place], [place], [address] Phone: [phone]   Fax:  [phone]  Name: [name] MRN: [id number] Date of Birth: [birth year]

## 2020-07-13 NOTE — Patient Instructions (Signed)
Access Code: [email or web address] URL: [email or web address] Date: 07/13/2020 Prepared by: Rudell Cobb  Exercises Sit to Stand - 2 x daily - 7 x weekly - 1 sets - 10 reps Single Leg Stance with Support - 2 x daily - 7 x weekly - 1 sets - 3 reps - 10-15 seconds hold Heel Toe Raises with Counter Support - 2 x daily - 7 x weekly - 1 sets - 20 reps Walking Tandem Stance - 2 x daily - 7 x weekly - 1 sets - 10 reps

## 2020-07-16 ENCOUNTER — Other Ambulatory Visit: Payer: Self-pay

## 2020-07-16 ENCOUNTER — Ambulatory Visit (INDEPENDENT_AMBULATORY_CARE_PROVIDER_SITE_OTHER): Payer: Medicare HMO | Admitting: Rehabilitative and Restorative Service Providers"

## 2020-07-16 DIAGNOSIS — R2681 Unsteadiness on feet: Secondary | ICD-10-CM | POA: Diagnosis not present

## 2020-07-16 DIAGNOSIS — M6281 Muscle weakness (generalized): Secondary | ICD-10-CM

## 2020-07-16 DIAGNOSIS — R2689 Other abnormalities of gait and mobility: Secondary | ICD-10-CM | POA: Diagnosis not present

## 2020-07-16 NOTE — Therapy (Signed)
[place], [place], [address] Phone: [phone]   Fax:  [phone]  Physical Therapy Treatment  Patient Details  Name: [name] MRN: [id number] Date of Birth: January 29, 1951 Referring Provider (PT): Levy Pupa, [place]   Encounter Date: 07/16/2020   PT End of Session - 07/16/20 1110     Visit Number 4    Number of Visits 12    Date for PT Re-Evaluation 08/16/20    Authorization Type [employer]-- requested 12 visits    Authorization - Visit Number 4    Authorization - Number of Visits 12    Progress Note Due on Visit 10    PT Start Time 1106    PT Stop Time 4401    PT Time Calculation (min) 39 min    Activity Tolerance Patient tolerated treatment well    Behavior During Therapy WFL for tasks assessed/performed             Past Medical History:  Diagnosis Date   ADD (attention deficit disorder)    Arthritis    "bunion on  right" (08/11/2014)   Chronic bronchitis ([place])    "plenty; but not q yr" (08/11/2014)   Depression    Dyslipidemia    Environmental allergies    GERD (gastroesophageal reflux disease)    Hearing difficulty of left ear    "grew up w/deaf parent; find myself lip reading more recently" (08/11/2014)   Hepatitis    "exposed in college; rec'd gamma globulin; 6 months later S/S (weakness, lethargy, fevers); ; dx'd infectious hepatitis"   History of herpes genitalis    "stress induced reoccurance that shows up on my left middle finger" (08/11/2014)   History of hiatal hernia    History of stomach ulcers    Hypercholesterolemia    Hyperthyroidism 1994   "postpartum only; resolved itself"   Incisional hernia    Internal hemorrhoids    Leg cramping    Osteopenia    Seasonal asthma    Stress incontinence, female    Ulcer     Past Surgical History:  Procedure Laterality Date   CESAREAN SECTION  1994   CHALAZION EXCISION Left ~ 2013   DILATION AND CURETTAGE OF  UTERUS  X 2   EYE SURGERY     HERNIA REPAIR  08/11/2014   INCISIONAL HERNIA REPAIR N/A 08/11/2014   Procedure: LAPAROSCOPIC INCISIONAL HERNIA REPAIR;  Surgeon: Coralie Keens, MD;  Location: [facility];  Service: General;  Laterality: N/A;   INCONTINENCE SURGERY  2005   INGUINAL HERNIA REPAIR Left 08/11/2014   INGUINAL HERNIA REPAIR Left 08/11/2014   Procedure: LEFT INGUINAL HERNIA REPAIR;  Surgeon: Coralie Keens, MD;  Location: [facility];  Service: General;  Laterality: Left;   INSERTION OF MESH Left 08/11/2014   Procedure: INSERTION OF MESH TO LEFT GROIN AND ABDOMEN;  Surgeon: Coralie Keens, MD;  Location: [facility];  Service: General;  Laterality: Left;   LAPAROSCOPIC INCISIONAL / UMBILICAL / [place]  08/11/2014   IHR   NASAL POLYP EXCISION  2014   REPAIR OF PERFORATED ULCER  1985   TONSILLECTOMY     TOTAL SHOULDER ARTHROPLASTY Left 09/21/2017   Procedure: LEFT TOTAL SHOULDER ARTHROPLASTY;  Surgeon: Nicholes Stairs, MD;  Location: [facility];  Service: Orthopedics;  Laterality: Left;    There were no vitals filed for this visit.   Subjective Assessment - 07/16/20 1109     Subjective The patient reports she forgot her  medication yesterday.    Pertinent History R foot bunionectomy, 2nd toe on R side s/p surgery,  chronic R hip pain (she gets steroid injections), overactive bladder,    Patient Stated Goals If I can stand on one leg at a time without holding, I would think that is miraculous; Be able to get up from the floor and stand up without having to put so much weight on her knees;    Currently in Pain? No/denies                [facility] PT Assessment - 07/16/20 1120       Assessment   Medical Diagnosis other abnormalities of gait    Referring Provider (PT) Levy Pupa, PA    Onset Date/Surgical Date 06/24/20    Hand Dominance Right                           [facility] Adult PT Treatment/Exercise - 07/16/20 1112       Ambulation/Gait    Ambulation/Gait Yes    Ambulation/Gait Assistance 7: Independent    Assistive device None    Stairs Yes    Stairs Assistance 7: Independent    Stair Management Technique Alternating pattern   x 3 reps up and down 6 steps   Gait Comments dynamic gait activities working on head motion vertical and horizontal and tandem gait, performed backwards walking and gait with eyes closed and CGA      Neuro Re-ed    Neuro Re-ed Details  Foam standing with head rotation and head nods x 10 reps each, foam standing with eyes closed x 1 rep x 30 seconds, foam standing with lateral step ups x 5 reps each side.  Marching in place with arms overhead engaging core musculature.  The patient performed single limb standing and high marches over canes.  Performed tandem stance working on ankle strategy.      Lumbar Exercises: Aerobic   Nustep leve 5 x 3 minutes LEs only                         PT Long Term Goals - 07/05/20 1304       PT LONG TERM GOAL #1   Title The patient will return demo HEP for LE strengthening, balance.    Time 6    Period Weeks    Target Date 08/16/20      PT LONG TERM GOAL #2   Title The patient will improve functional gait assessment from 23/30 up to 27/30.    Time 6    Period Weeks    Target Date 08/16/20      PT LONG TERM GOAL #3   Title The patient will maintain single limb stance for 10 seconds R and L sides to demo improved stability.    Time 6    Period Weeks    Target Date 08/16/20      PT LONG TERM GOAL #4   Title The patient will negotiate 4 steps with reciprocal pattern ascending and descending.    Time 6    Period Weeks    Target Date 08/16/20                   Plan - 07/16/20 1356     Clinical Impression Statement The patient is continuing to progress with balance, dynamic gait and strengthening.  Continue to work to [employer].    PT  Treatment/Interventions Patient/family education;ADLs/Self Care Home Management;Gait training;Stair  training;Neuromuscular re-education;Balance training;Therapeutic exercise;Therapeutic activities;Functional mobility training;Manual techniques;Taping    PT Next Visit Plan Progress hip flexor/glut strengthening, R ankle DF strengthening, dynamic gait, dynamic balance.    Consulted and Agree with Plan of Care Patient             Patient will benefit from skilled therapeutic intervention in order to improve the following deficits and impairments:     Visit Diagnosis: Other abnormalities of gait and mobility  Muscle weakness (generalized)  Unsteadiness on feet     Problem List Patient Active Problem List   Diagnosis Date Noted   Iron deficiency 04/01/2020   Primary insomnia 08/22/2019   Obsessional thoughts 08/22/2019   Chronic constipation 07/07/2019   Leg cramps 07/07/2019   Dyspepsia 05/15/2019   Degeneration of lumbar intervertebral disc 11/28/2018   Non-seasonal allergic rhinitis 10/03/2018   OAB (overactive bladder) 07/18/2018   Hair loss 07/18/2018   Localized, primary osteoarthritis of shoulder region 04/18/2018   Low back pain 03/06/2018   Chronic musculoskeletal pain 11/13/2017   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   IFG (impaired fasting glucose) 08/03/2017   Primary osteoarthritis of both knees 07/29/2017   Osteoarthritis, localized, shoulder, left 07/29/2017   Sinusitis, chronic    Asthma 01/08/2012   Dyslipidemia    ADD (attention deficit disorder)    Depression    Osteopenia    Incisional hernia    Stress incontinence, female     [place], PT 07/16/2020, 1:57 PM  [facility] [place] [address] [place], [place], [address] Phone: [phone]   Fax:  [phone]  Name: CRISTELLA STIVER MRN: [id number] Date of Birth: Dec 03, 1950

## 2020-07-20 ENCOUNTER — Encounter: Payer: Medicare HMO | Admitting: Physical Therapy

## 2020-07-21 ENCOUNTER — Encounter: Payer: Self-pay | Admitting: Family Medicine

## 2020-07-22 ENCOUNTER — Ambulatory Visit (INDEPENDENT_AMBULATORY_CARE_PROVIDER_SITE_OTHER): Payer: Medicare HMO | Admitting: Physical Therapy

## 2020-07-22 ENCOUNTER — Ambulatory Visit (INDEPENDENT_AMBULATORY_CARE_PROVIDER_SITE_OTHER): Payer: Medicare HMO | Admitting: Physician Assistant

## 2020-07-22 ENCOUNTER — Other Ambulatory Visit: Payer: Self-pay

## 2020-07-22 ENCOUNTER — Encounter: Payer: Self-pay | Admitting: Physical Therapy

## 2020-07-22 ENCOUNTER — Telehealth: Payer: Medicare HMO

## 2020-07-22 VITALS — HR 84 | Resp 16

## 2020-07-22 DIAGNOSIS — R0602 Shortness of breath: Secondary | ICD-10-CM | POA: Diagnosis not present

## 2020-07-22 DIAGNOSIS — R2689 Other abnormalities of gait and mobility: Secondary | ICD-10-CM | POA: Diagnosis not present

## 2020-07-22 DIAGNOSIS — R252 Cramp and spasm: Secondary | ICD-10-CM

## 2020-07-22 DIAGNOSIS — M6281 Muscle weakness (generalized): Secondary | ICD-10-CM

## 2020-07-22 DIAGNOSIS — R2681 Unsteadiness on feet: Secondary | ICD-10-CM

## 2020-07-22 DIAGNOSIS — D509 Iron deficiency anemia, unspecified: Secondary | ICD-10-CM

## 2020-07-22 DIAGNOSIS — R609 Edema, unspecified: Secondary | ICD-10-CM | POA: Insufficient documentation

## 2020-07-22 DIAGNOSIS — R790 Abnormal level of blood mineral: Secondary | ICD-10-CM | POA: Diagnosis not present

## 2020-07-22 LAB — FERRITIN: Ferritin: 21 ng/mL (ref 16–288)

## 2020-07-22 NOTE — Progress Notes (Signed)
Acute Office Visit  Subjective:    Patient ID: [name], female    DOB: 03-15-1950, 70 y.o.   MRN: [id number]  No chief complaint on file.   HPI Patient is in today for bilateral foot and ankle swelling - this is a chronic problem that has flared up over the last 4 days Noted symptoms on Sunday night after spending a lot of time on her feet Right is greater than left Improves slightly with elevation She has compression socks, but only wears them when traveling Denies calf tenderness or claudication Reports waxing and waning pain in her right midfoot that was worse on Sunday She has seen podiatry for midfoot arthritis and underwent steroid injection in March Currently in PT for abnormal gait and muscle weakness Denies prior hx of VTE, current cancer, clotting disorder, or recent immobilization Had negative venous Doppler at [facility] 03/19/20   Past Medical History:  Diagnosis Date   ADD (attention deficit disorder)    Arthritis    "bunion on  right" (08/11/2014)   Chronic bronchitis ([place])    "plenty; but not q yr" (08/11/2014)   Depression    Dyslipidemia    Environmental allergies    GERD (gastroesophageal reflux disease)    Hearing difficulty of left ear    "grew up w/deaf parent; find myself lip reading more recently" (08/11/2014)   Hepatitis    "exposed in college; rec'd gamma globulin; 6 months later S/S (weakness, lethargy, fevers); ; dx'd infectious hepatitis"   History of herpes genitalis    "stress induced reoccurance that shows up on my left middle finger" (08/11/2014)   History of hiatal hernia    History of stomach ulcers    Hypercholesterolemia    Hyperthyroidism 1994   "postpartum only; resolved itself"   Incisional hernia    Internal hemorrhoids    Leg cramping    Osteopenia    Seasonal asthma    Stress incontinence, female    Ulcer     Past Surgical History:  Procedure Laterality Date   CESAREAN SECTION  1994   CHALAZION EXCISION Left ~ 2013    DILATION AND CURETTAGE OF UTERUS  X 2   EYE SURGERY     HERNIA REPAIR  08/11/2014   INCISIONAL HERNIA REPAIR N/A 08/11/2014   Procedure: LAPAROSCOPIC INCISIONAL HERNIA REPAIR;  Surgeon: Coralie Keens, MD;  Location: [place] [facility];  Service: General;  Laterality: N/A;   INCONTINENCE SURGERY  2005   INGUINAL HERNIA REPAIR Left 08/11/2014   INGUINAL HERNIA REPAIR Left 08/11/2014   Procedure: LEFT INGUINAL HERNIA REPAIR;  Surgeon: Coralie Keens, MD;  Location: [place];  Service: General;  Laterality: Left;   INSERTION OF MESH Left 08/11/2014   Procedure: INSERTION OF MESH TO LEFT GROIN AND ABDOMEN;  Surgeon: Coralie Keens, MD;  Location: [place];  Service: General;  Laterality: Left;   LAPAROSCOPIC INCISIONAL / UMBILICAL / [place]  08/11/2014   IHR   NASAL POLYP EXCISION  2014   REPAIR OF PERFORATED ULCER  1985   TONSILLECTOMY     TOTAL SHOULDER ARTHROPLASTY Left 09/21/2017   Procedure: LEFT TOTAL SHOULDER ARTHROPLASTY;  Surgeon: Nicholes Stairs, MD;  Location: [place];  Service: Orthopedics;  Laterality: Left;    Family History  Problem Relation Age of Onset   Arthritis Mother    Heart disease Mother        CABG @ 23   Diabetes Mother    Osteoporosis Mother    Hyperlipidemia Mother  Arthritis Father    Lymphoma Father 51       chemo   Cancer Father        lymphoma   Lymphoma Paternal Grandmother    Diabetes Maternal Grandmother    Colon cancer Neg Hx    Rectal cancer Neg Hx    Esophageal cancer Neg Hx     Social History   Socioeconomic History   Marital status: Single    Spouse name: Not on file   Number of children: 1   Years of education: 16   Highest education level: Bachelor's degree (e.g., BA, AB, BS)  Occupational History   Occupation: Consulting civil engineer    Comment: Full time; Barista  Tobacco Use   Smoking status: Former    Packs/day: 0.00    Pack years: 0.00    Types: Cigarettes   Smokeless tobacco: Never   Tobacco comments:     Social Smoker in her 81's  Vaping Use   Vaping Use: Never used  Substance and Sexual Activity   Alcohol use: Yes    Alcohol/week: 2.0 - 4.0 standard drinks    Types: 2 - 4 Standard drinks or equivalent per week   Drug use: No   Sexual activity: Not Currently    Birth control/protection: Post-menopausal  Other Topics Concern   Not on file  Social History Narrative   Marital status: divorced; not dating      Children: 1 daughter; no grandchildren      Lives:  In senior living apartment      Employment: Optometrist      Tobacco: used only socially in early 20's- never a regular user      Alcohol: 3-4 drinks a week- spread out      Drugs: never      Exercise: stretching exercises every day 15-30mn- no vigorous activity   Social Determinants of HRadio broadcast assistantStrain: Low Risk    Difficulty of Paying Living Expenses: Not hard at all  Food Insecurity: No Food Insecurity   Worried About RCharity fundraiserin the Last Year: Never true   RChicoin the Last Year: Never true  Transportation Needs: No Transportation Needs   Lack of Transportation (Medical): No   Lack of Transportation (Non-Medical): No  Physical Activity: Inactive   Days of Exercise per Week: 0 days   Minutes of Exercise per Session: 0 min  Stress: No Stress Concern Present   Feeling of Stress : Not at all  Social Connections: Moderately Integrated   Frequency of Communication with Friends and Family: More than three times a week   Frequency of Social Gatherings with Friends and Family: More than three times a week   Attends Religious Services: More than 4 times per year   Active Member of [employer] Organizations: Yes   Attends CMusic therapist More than 4 times per year   Marital Status: Divorced  IHuman resources officerViolence: Not At Risk   Fear of Current or Ex-Partner: No   Emotionally Abused: No   Physically Abused: No   Sexually Abused: No    Outpatient  Medications Prior to Visit  Medication Sig Dispense Refill   acetaminophen (TYLENOL) 650 MG CR tablet Take 1,300 mg by mouth See admin instructions. Take 1,300 mg by mouth at bedtime and may also take an additional 1,300 mg one to two times a day as needed for pain     amphetamine-dextroamphetamine (ADDERALL XR)  25 MG 24 hr capsule Take 1 capsule by mouth every morning. 90 capsule 0   atorvastatin (LIPITOR) 40 MG tablet Take 1 tablet (40 mg total) by mouth at bedtime. 90 tablet 3   cyclobenzaprine (FLEXERIL) 10 MG tablet Take 1 tablet (10 mg total) by mouth at bedtime as needed (for sleep or pain). 90 tablet 1   docusate sodium (COLACE) 100 MG capsule Take 100 mg by mouth 2 (two) times daily.     DULoxetine (CYMBALTA) 30 MG capsule TAKE 2 CAPSULES IN THE MORNING  AND TAKE 1 CAPSULE IN THE EVENING 270 capsule 1   loratadine (CLARITIN) 10 MG tablet Take 1 tablet (10 mg total) by mouth daily. 90 tablet 3   Misc Natural Products (AIRBORNE ELDERBERRY) CHEW Chew 1 each by mouth daily.     Multiple Vitamins-Minerals (ONE-A-DAY WOMENS 50 PLUS) TABS Take 1 tablet by mouth daily.     oxybutynin (DITROPAN-XL) 10 MG 24 hr tablet oxybutynin chloride ER 10 mg tablet,extended release 24 hr  TAKE 1 TABLET BY MOUTH EVERY DAY     SYMBICORT 160-4.5 MCG/ACT inhaler INHALE 2 PUFFS EVERY DAY (EXPIRES 90 DAYS AFTER OPENING) 2 each 1   Vibegron (GEMTESA) 75 MG TABS Take 1 tablet by mouth daily.     CALCIUM PO Take 1,200 mg by mouth.     Ferrous Sulfate Dried (FERROUS SULFATE IRON) 200 (65 Fe) MG TABS Take by mouth.     Turmeric Curcumin 500 MG CAPS Take 1,500 mg by mouth daily.  (Patient not taking: No sig reported)     cefdinir (OMNICEF) 300 MG capsule Take 1 capsule (300 mg total) by mouth 2 (two) times daily. (Patient not taking: No sig reported) 14 capsule 0   No facility-administered medications prior to visit.    Allergies  Allergen Reactions   Aspirin Other (See Comments)    GI Intolerance; History of  ulcers; GI BLEED   Bee Venom Swelling    Any insects that bite or stings- swelling at site of sting or cellulitis can develop   Nsaids Other (See Comments)    History of ulcers   Vesicare [Solifenacin] Itching    Review of Systems     Objective:    Vitals:   07/22/20 1608  Pulse: 84  Resp: 16    Physical Exam Vitals reviewed.  Constitutional:      Appearance: Normal appearance. She is not ill-appearing or toxic-appearing.  Cardiovascular:     Rate and Rhythm: Normal rate and regular rhythm.     Pulses:          Dorsalis pedis pulses are 1+ on the right side and 1+ on the left side.       Posterior tibial pulses are 1+ on the right side and 1+ on the left side.     Heart sounds: Normal heart sounds.  Pulmonary:     Effort: Pulmonary effort is normal.     Breath sounds: Normal breath sounds. No rhonchi or rales.  Musculoskeletal:     Right lower leg: 1+ Edema present.     Left lower leg: 1+ Edema present.  Feet:     Right foot:     Skin integrity: Skin integrity normal.     Left foot:     Skin integrity: Skin integrity normal.  Skin:    General: Skin is warm and dry.     Findings: Erythema (distal lower extremities) present.  Neurological:     Mental Status: She is alert.  Pulse 84   Resp 16   LMP 08/30/2008  Wt Readings from Last 3 Encounters:  04/09/20 179 lb (81.2 kg)  04/01/20 179 lb 11.2 oz (81.5 kg)  03/05/20 183 lb 9.6 oz (83.3 kg)   BP Readings from Last 3 Encounters:  04/09/20 136/83  04/01/20 129/75  03/05/20 125/78    There are no preventive care reminders to display for this patient.   There are no preventive care reminders to display for this patient.   Lab Results  Component Value Date   TSH 1.73 09/25/2018   Lab Results  Component Value Date   WBC 5.9 07/22/2020   HGB 14.7 07/22/2020   HCT 43.0 07/22/2020   MCV 91.5 07/22/2020   PLT 355 07/22/2020   Lab Results  Component Value Date   NA 142 07/22/2020   K 4.8 07/22/2020    CO2 29 07/22/2020   GLUCOSE 101 (H) 07/22/2020   BUN 16 07/22/2020   CREATININE 0.80 07/22/2020   BILITOT 0.6 07/22/2020   ALKPHOS 104 07/02/2017   AST 23 07/22/2020   ALT 23 07/22/2020   PROT 6.7 07/22/2020   ALBUMIN 4.3 07/02/2017   CALCIUM 9.7 07/22/2020   ANIONGAP 7 09/22/2017   GFR 74.97 03/19/2013   Lab Results  Component Value Date   CREATININE 0.80 07/22/2020   BUN 16 07/22/2020   NA 142 07/22/2020   K 4.8 07/22/2020   CL 106 07/22/2020   CO2 29 07/22/2020   Lab Results  Component Value Date   IRON 82 02/26/2020   TIBC 370 02/26/2020   FERRITIN 21 07/22/2020    Lab Results  Component Value Date   HGBA1C 6.0 (A) 07/07/2019   Barrie Folk, MD - 03/19/2020  Formatting of this note might be different from the original.  TECHNIQUE: The veins of both lower extremities were interrogated from the visible common femoral vein to the distal popliteal vein.  The junction of the greater saphenous with the common femoral vein as well as the posterior tibial veins were evaluated.  Gray scale, color, spectral, and doppler sonography was utilized and analyzed.  COMPARISON: None.    INDICATION: Other specified soft tissue disorders,   FINDINGS: Flow is present in all interrogated vessels and there are no internal echoes.  Normal venous compressibility is demonstrated and there is augmentation of flow with appropriate maneuvers.     INCIDENTAL FINDINGS:  Right Baker's cyst measures 9 x 17 x 55 mm, left Baker's cyst is 4 x 15 x 41 mm. Soft tissue edema is confirmed at both ankles.    IMPRESSION:  1. No evidence of deep venous thrombosis.  2. Bilateral Baker's cysts, ankle edema.   Electronically Signed by: Delma Post on 03/19/2020 2:23 PM    Assessment & Plan:   Problem List Items Addressed This Visit       Other   Leg cramps   Relevant Orders   COMPLETE METABOLIC PANEL WITH GFR (Completed)   B Nat Peptide (Completed)   TSH + free T4 (Completed)   CBC  with Differential (Completed)   Sed Rate (ESR) (Completed)   CK (Creatine Kinase) (Completed)   Ferritin (Completed)   Peripheral edema - Primary   Relevant Orders   COMPLETE METABOLIC PANEL WITH GFR (Completed)   B Nat Peptide (Completed)   TSH + free T4 (Completed)   CBC with Differential (Completed)   Sed Rate (ESR) (Completed)   CK (Creatine Kinase) (Completed)   VAS [place] LOWER EXTREMITY VENOUS  REFLUX   Other Visit Diagnoses     Low serum ferritin level       Relevant Orders   Ferritin (Completed)   Iron deficiency anemia, unspecified iron deficiency anemia type       Relevant Orders   COMPLETE METABOLIC PANEL WITH GFR (Completed)   CBC with Differential (Completed)   Ferritin (Completed)      Patient with acute on chronic bilateral, symmetric non-pitting 1+ peripheral edema Reviewed patient's images from My[facility] demonstrating 2-3+ edema of the ankles Reviewed venous doppler report in Care Everywhere (copied above) Wells score=0, low risk for DVT Counseled patient on general measures for peripheral venous insufficiency - wear compression socks/stockings 8-12 hrs daily and any time with prolonged standing/activity CMP, ESR, CK, TSH, BNP pending to r/o renal insufficiency, liver failure, CHF, and thyroid disease - discussed that these labs will be normal in the case of venous insufficiency Patient requested venous reflux test to confirm diagnosis and this was ordered Reviewed signs/symptoms of VTE and ER precautions  Patient also requested monitoring of her iron deficiency anemia - CBC/d and ferritin ordered. This is managed by PCP  No orders of the defined types were placed in this encounter.    Trixie Dredge, [place]

## 2020-07-22 NOTE — Therapy (Signed)
[place], [place], [address] Phone: [phone]   Fax:  [phone]  Physical Therapy Treatment  Patient Details  Name: [name] MRN: [id number] Date of Birth: 28-Dec-1950 Referring Provider (PT): Levy Pupa, [place]   Encounter Date: 07/22/2020   PT End of Session - 07/22/20 1154     Visit Number 5    Number of Visits 12    Date for PT Re-Evaluation 08/16/20    Authorization Type [employer]-- requested 12 visits    Authorization - Visit Number 5    Authorization - Number of Visits 12    Progress Note Due on Visit 10    PT Start Time 1148    PT Stop Time 1228    PT Time Calculation (min) 40 min    Activity Tolerance Patient tolerated treatment well    Behavior During Therapy [facility] for tasks assessed/performed             Past Medical History:  Diagnosis Date   ADD (attention deficit disorder)    Arthritis    "bunion on  right" (08/11/2014)   Chronic bronchitis ([place])    "plenty; but not q yr" (08/11/2014)   Depression    Dyslipidemia    Environmental allergies    GERD (gastroesophageal reflux disease)    Hearing difficulty of left ear    "grew up w/deaf parent; find myself lip reading more recently" (08/11/2014)   Hepatitis    "exposed in college; rec'd gamma globulin; 6 months later S/S (weakness, lethargy, fevers); ; dx'd infectious hepatitis"   History of herpes genitalis    "stress induced reoccurance that shows up on my left middle finger" (08/11/2014)   History of hiatal hernia    History of stomach ulcers    Hypercholesterolemia    Hyperthyroidism 1994   "postpartum only; resolved itself"   Incisional hernia    Internal hemorrhoids    Leg cramping    Osteopenia    Seasonal asthma    Stress incontinence, female    Ulcer     Past Surgical History:  Procedure Laterality Date   CESAREAN SECTION  1994   CHALAZION EXCISION Left ~ 2013   DILATION AND CURETTAGE OF  UTERUS  X 2   EYE SURGERY     HERNIA REPAIR  08/11/2014   INCISIONAL HERNIA REPAIR N/A 08/11/2014   Procedure: LAPAROSCOPIC INCISIONAL HERNIA REPAIR;  Surgeon: Coralie Keens, MD;  Location: [place] [facility];  Service: General;  Laterality: N/A;   INCONTINENCE SURGERY  2005   INGUINAL HERNIA REPAIR Left 08/11/2014   INGUINAL HERNIA REPAIR Left 08/11/2014   Procedure: LEFT INGUINAL HERNIA REPAIR;  Surgeon: Coralie Keens, MD;  Location: [place];  Service: General;  Laterality: Left;   INSERTION OF MESH Left 08/11/2014   Procedure: INSERTION OF MESH TO LEFT GROIN AND ABDOMEN;  Surgeon: Coralie Keens, MD;  Location: [place] [facility];  Service: General;  Laterality: Left;   LAPAROSCOPIC INCISIONAL / UMBILICAL / [place]  08/11/2014   IHR   NASAL POLYP EXCISION  2014   REPAIR OF PERFORATED ULCER  1985   TONSILLECTOMY     TOTAL SHOULDER ARTHROPLASTY Left 09/21/2017   Procedure: LEFT TOTAL SHOULDER ARTHROPLASTY;  Surgeon: Nicholes Stairs, MD;  Location: [place];  Service: Orthopedics;  Laterality: Left;    There were no vitals filed for this visit.   Subjective Assessment - 07/22/20 1152     Subjective Pt reports she had swelling in  her Rt foot Sunday night.  She cancelled last appt due to it.    Pertinent History R foot bunionectomy, 2nd toe on R side s/p surgery,  chronic R hip pain (she gets steroid injections), overactive bladder,    Patient Stated Goals If I can stand on one leg at a time without holding, I would think that is miraculous; Be able to get up from the floor and stand up without having to put so much weight on her knees;    Currently in Pain? No/denies                [facility] PT Assessment - 07/22/20 0001       Assessment   Medical Diagnosis other abnormalities of gait    Referring Provider (PT) Levy Pupa, PA    Onset Date/Surgical Date 06/24/20    Hand Dominance Right      Functional Tests   Functional tests Single leg stance      Single Leg Stance   Comments Rt  : 4, 10, 20 sec, Lt 15, 20.               [facility] Adult PT Treatment/Exercise - 07/22/20 0001       Lumbar Exercises: Supine   Other Supine Lumbar Exercises legs elevated on 2 bolsters and ankle pumps x 5 min      Knee/Hip Exercises: Standing   Stairs 13 standard stairs with reciprocal pattern and unilateral rail   4-6" steps x 15 without UE support.   SLS R/L SLS x 3 each    Other Standing Knee Exercises sit to/from stand with 4 pauses along the way x 10             Balance Exercises - 07/22/20 0001       Balance Exercises: Standing   Rockerboard Lateral;EO   60 seconds   Tandem Gait Forward;Retro;2 reps    Partial Tandem Stance Eyes open;Foam/compliant surface;Other (comment)   with horiz/vertical head turns and reciprocal arm swing             PT Long Term Goals - 07/22/20 1227       PT LONG TERM GOAL #1   Title The patient will return demo HEP for LE strengthening, balance.    Time 6    Period Weeks    Status On-going      PT LONG TERM GOAL #2   Title The patient will improve functional gait assessment from 23/30 up to 27/30.    Time 6    Period Weeks    Status On-going      PT LONG TERM GOAL #3   Title The patient will maintain single limb stance for 10 seconds R and L sides to demo improved stability.    Time 6    Period Weeks    Status Achieved      PT LONG TERM GOAL #4   Title The patient will negotiate 4 steps with reciprocal pattern ascending and descending.    Time 6    Period Weeks    Status Achieved                   Plan - 07/22/20 1227     Clinical Impression Statement SLS and reciprocal stairs improved; has met LTG#3 and 4. Pt reports discomfort in bilat feet after completion of standing balance exercises. Ended session with leg elevation with ankle pumps to assist in edema reduction. Took short seated rest breaks to  avoid increased irritation / swelling in feet. Progressing well towards remaining goals.    Rehab Potential  Good    PT Frequency 2x / week    PT Duration 6 weeks    PT Treatment/Interventions Patient/family education;ADLs/Self Care Home Management;Gait training;Stair training;Neuromuscular re-education;Balance training;Therapeutic exercise;Therapeutic activities;Functional mobility training;Manual techniques;Taping    PT Next Visit Plan Progress hip flexor/glut strengthening, R ankle DF strengthening, dynamic gait, dynamic balance.    Consulted and Agree with Plan of Care Patient             Patient will benefit from skilled therapeutic intervention in order to improve the following deficits and impairments:  Decreased balance, Postural dysfunction, Decreased strength, Abnormal gait  Visit Diagnosis: Muscle weakness (generalized)  Unsteadiness on feet  Other abnormalities of gait and mobility     Problem List Patient Active Problem List   Diagnosis Date Noted   Iron deficiency 04/01/2020   Primary insomnia 08/22/2019   Obsessional thoughts 08/22/2019   Chronic constipation 07/07/2019   Leg cramps 07/07/2019   Dyspepsia 05/15/2019   Degeneration of lumbar intervertebral disc 11/28/2018   Non-seasonal allergic rhinitis 10/03/2018   OAB (overactive bladder) 07/18/2018   Hair loss 07/18/2018   Localized, primary osteoarthritis of shoulder region 04/18/2018   Low back pain 03/06/2018   Chronic musculoskeletal pain 11/13/2017   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   IFG (impaired fasting glucose) 08/03/2017   Primary osteoarthritis of both knees 07/29/2017   Osteoarthritis, localized, shoulder, left 07/29/2017   Sinusitis, chronic    Asthma 01/08/2012   Dyslipidemia    ADD (attention deficit disorder)    Depression    Osteopenia    Incisional hernia    Stress incontinence, female    Kerin Perna, PTA 07/22/20 12:46 PM  [place], [place], [address] Phone: [phone]    Fax:  [phone]  Name: [name] MRN: [id number] Date of Birth: August 07, 1950

## 2020-07-22 NOTE — Patient Instructions (Signed)
Chronic Venous Insufficiency Chronic venous insufficiency is a condition where the leg veins cannot effectively pump blood from the legs to the heart. This happens when the vein walls are either stretched, weakened, or damaged, or when the valves inside the vein are damaged. With the right treatment, you should be able to continue withan active life. This condition is also called venous stasis. What are the causes? Common causes of this condition include: High blood pressure inside the veins (venous hypertension). Sitting or standing too long, causing increased blood pressure in the leg veins. A blood clot that blocks blood flow in a vein (deep vein thrombosis, DVT). Inflammation of a vein (phlebitis) that causes a blood clot to form. Tumors in the pelvis that cause blood to back up. What increases the risk? The following factors may make you more likely to develop this condition: Having a family history of this condition. Obesity. Pregnancy. Living without enough regular physical activity or exercise (sedentary lifestyle). Smoking. Having a job that requires long periods of standing or sitting in one place. Being a certain age. Women in their 29s and 70s and men in their 57s are more likely to develop this condition. What are the signs or symptoms? Symptoms of this condition include: Veins that are enlarged, bulging, or twisted (varicose veins). Skin breakdown or ulcers. Reddened skin or dark discoloration of skin on the leg between the knee and ankle. Brown, smooth, tight, and painful skin just above the ankle, usually on the inside of the leg (lipodermatosclerosis). Swelling of the legs. How is this diagnosed? This condition may be diagnosed based on: Your medical history. A physical exam. Tests, such as: A procedure that creates an image of a blood vessel and nearby organs and provides information about blood flow through the blood vessel (duplex ultrasound). A procedure that tests  blood flow (plethysmography). A procedure that looks at the veins using X-ray and dye (venogram). How is this treated? The goals of treatment are to help you return to an active life and to minimize pain or disability. Treatment depends on the severity of your condition, and it may include: Wearing compression stockings. These can help relieve symptoms and help prevent your condition from getting worse. However, they do not cure the condition. Sclerotherapy. This procedure involves an injection of a solution that shrinks damaged veins. Surgery. This may involve: Removing a diseased vein (vein stripping). Cutting off blood flow through the vein (laser ablation surgery). Repairing or reconstructing a valve within the affected vein. Follow these instructions at home:     Wear compression stockings as told by your health care provider. These stockings help to prevent blood clots and reduce swelling in your legs. Take over-the-counter and prescription medicines only as told by your health care provider. Stay active by exercising, walking, or doing different activities. Ask your health care provider what activities are safe for you and how much exercise you need. Drink enough fluid to keep your urine pale yellow. Do not use any products that contain nicotine or tobacco, such as cigarettes, e-cigarettes, and chewing tobacco. If you need help quitting, ask your health care provider. Keep all follow-up visits as told by your health care provider. This is important. Contact a health care provider if you: Have redness, swelling, or more pain in the affected area. See a red streak or line that goes up or down from the affected area. Have skin breakdown or skin loss in the affected area, even if the breakdown is small. Get an  injury in the affected area. Get help right away if: You get an injury and an open wound in the affected area. You have: Severe pain that does not get better with  medicine. Sudden numbness or weakness in the foot or ankle below the affected area. Trouble moving your foot or ankle. A fever. Worse or persistent symptoms. Chest pain. Shortness of breath. Summary Chronic venous insufficiency is a condition where the leg veins cannot effectively pump blood from the legs to the heart. Chronic venous insufficiency occurs when the vein walls become stretched, weakened, or damaged, or when valves within the vein are damaged. Treatment depends on how severe your condition is. It often involves wearing compression stockings and may involve having a procedure. Make sure you stay active by exercising, walking, or doing different activities. Ask your health care provider what activities are safe for you and how much exercise you need. This information is not intended to replace advice given to you by your health care provider. Make sure you discuss any questions you have with your healthcare provider. Document Revised: 10/09/2017 Document Reviewed: 10/09/2017 Elsevier Patient Education  [place].

## 2020-07-23 ENCOUNTER — Encounter: Payer: Self-pay | Admitting: Physician Assistant

## 2020-07-23 ENCOUNTER — Ambulatory Visit: Payer: Medicare HMO | Admitting: Physician Assistant

## 2020-07-23 ENCOUNTER — Ambulatory Visit (INDEPENDENT_AMBULATORY_CARE_PROVIDER_SITE_OTHER): Payer: Medicare HMO | Admitting: Pharmacist

## 2020-07-23 DIAGNOSIS — G8929 Other chronic pain: Secondary | ICD-10-CM | POA: Diagnosis not present

## 2020-07-23 DIAGNOSIS — M7918 Myalgia, other site: Secondary | ICD-10-CM

## 2020-07-23 DIAGNOSIS — E785 Hyperlipidemia, unspecified: Secondary | ICD-10-CM | POA: Diagnosis not present

## 2020-07-23 LAB — COMPLETE METABOLIC PANEL WITH GFR
AG Ratio: 1.9 (calc) (ref 1.0–2.5)
ALT: 23 U/L (ref 6–29)
AST: 23 U/L (ref 10–35)
Albumin: 4.4 g/dL (ref 3.6–5.1)
Alkaline phosphatase (APISO): 71 U/L (ref 37–153)
BUN: 16 mg/dL (ref 7–25)
CO2: 29 mmol/L (ref 20–32)
Calcium: 9.7 mg/dL (ref 8.6–10.4)
Chloride: 106 mmol/L (ref 98–110)
Creat: 0.8 mg/dL (ref 0.50–0.99)
GFR, Est African American: 87 mL/min/{1.73_m2} (ref >=60)
GFR, Est Non African American: 75 mL/min/{1.73_m2} (ref >=60)
Globulin: 2.3 g/dL (calc) (ref 1.9–3.7)
Glucose, Bld: 101 mg/dL — ABNORMAL HIGH (ref 65–99)
Potassium: 4.8 mmol/L (ref 3.5–5.3)
Sodium: 142 mmol/L (ref 135–146)
Total Bilirubin: 0.6 mg/dL (ref 0.2–1.2)
Total Protein: 6.7 g/dL (ref 6.1–8.1)

## 2020-07-23 LAB — CBC WITH DIFFERENTIAL/PLATELET
Absolute Monocytes: 431 cells/uL (ref 200–950)
Basophils Absolute: 18 cells/uL (ref 0–200)
Basophils Relative: 0.3 %
Eosinophils Absolute: 130 cells/uL (ref 15–500)
Eosinophils Relative: 2.2 %
HCT: 43 % (ref 35.0–45.0)
Hemoglobin: 14.7 g/dL (ref 11.7–15.5)
Lymphs Abs: 2189 cells/uL (ref 850–3900)
MCH: 31.3 pg (ref 27.0–33.0)
MCHC: 34.2 g/dL (ref 32.0–36.0)
MCV: 91.5 fL (ref 80.0–100.0)
MPV: 10.7 fL (ref 7.5–12.5)
Monocytes Relative: 7.3 %
Neutro Abs: 3133 cells/uL (ref 1500–7800)
Neutrophils Relative %: 53.1 %
Platelets: 355 10*3/uL (ref 140–400)
RBC: 4.7 10*6/uL (ref 3.80–5.10)
RDW: 12.8 % (ref 11.0–15.0)
Total Lymphocyte: 37.1 %
WBC: 5.9 10*3/uL (ref 3.8–10.8)

## 2020-07-23 LAB — SEDIMENTATION RATE: Sed Rate: 9 mm/h (ref 0–30)

## 2020-07-23 LAB — TSH+FREE T4: TSH W/REFLEX TO FT4: 1.21 mIU/L (ref 0.40–4.50)

## 2020-07-23 LAB — BRAIN NATRIURETIC PEPTIDE: Brain Natriuretic Peptide: 34 pg/mL (ref <100)

## 2020-07-23 LAB — CK: Total CK: 81 U/L (ref 29–143)

## 2020-07-23 NOTE — Progress Notes (Signed)
Hi Caroline,  Overall your labs look great!  Kidney function is normal  No evidence of heart failure Inflammatory markers (CK and ESR) are normal Your leg swelling is most likely due to venous insufficiency like we discussed yesterday. Follow the recommendations to elevate legs and wear compression socks daily.  You are no longer anemic (hemoglobin is 14.7) and your ferritin continues to trend up. You should continue your iron supplement until ferritin is above 40. Dr. Suzi Roots will continue to monitor this for you every 3-4 months

## 2020-07-23 NOTE — Patient Instructions (Signed)
Visit Information   PATIENT GOALS:   Goals Addressed             This Visit's Progress    Medication Management        Patient Goals/Self-Care Activities Over the next 180 days, patient will:  take medications as prescribed  Follow Up Plan: Face to Face appointment with care management team member scheduled for: 6 months         Consent to CCM Services: Ms. Amy was given information about Chronic Care Management services today including:  CCM service includes personalized support from designated clinical staff supervised by her physician, including individualized plan of care and coordination with other care providers 24/7 contact phone numbers for assistance for urgent and routine care needs. Service will only be billed when office clinical staff spend 20 minutes or more in a month to coordinate care. Only one practitioner may furnish and bill the service in a calendar month. The patient may stop CCM services at any time (effective at the end of the month) by phone call to the office staff. The patient will be responsible for cost sharing (co-pay) of up to 20% of the service fee (after annual deductible is met).  Patient agreed to services and verbal consent obtained.   Patient verbalizes understanding of instructions provided today and agrees to view in [place].   Face to Face appointment with care management team member scheduled for:  6 months    CLINICAL CARE PLAN: Patient Care Plan: Medication Management     Problem Identified: HLD, chronic pain      Long-Range Goal: Disease Progression Prevention   Start Date: 07/23/2020  This Visit's Progress: On track  Priority: High  Note:   Current Barriers:  None at present  Pharmacist Clinical Goal(s):  Over the next 180 days, patient will maintain control of chronic conditions as evidenced by medication fill history, vital signs, and lab values  through collaboration with PharmD and provider.    Interventions: 1:1 collaboration with Hali Marry, MD regarding development and update of comprehensive plan of care as evidenced by provider attestation and co-signature Inter-disciplinary care team collaboration (see longitudinal plan of care) Comprehensive medication review performed; medication list updated in electronic medical record  Hyperlipidemia:  Uncontrolled; current treatment:atorvastatin 12m;   Medications previously tried: pravastatin, simvastatin   Recommended continue current regimen, recheck updated lipid panel for 2022.   and  Chronic Pain  Controlled; current treatment: tylenol 13090m duloxetine 6026mM, 37m48m;   Recommended continue current regimen  Patient Goals/Self-Care Activities Over the next 180 days, patient will:  take medications as prescribed  Follow Up Plan: Face to Face appointment with care management team member scheduled for: 6 months

## 2020-07-23 NOTE — Progress Notes (Signed)
Chronic Care Management Pharmacy Note  07/23/2020 Name:  [name] MRN:  [id number] DOB:  Aug 13, 1950  Summary: reviewed medications in depth, addressed HLD, chronic pain.   Recommendations/Changes made from today's visit: None  Plan: f/u with pharmacist in 6 months  Subjective: [name] is an 70 y.o. year old female who is a primary patient of Metheney, Rene Kocher, MD.  The CCM team was consulted for assistance with disease management and care coordination needs.    Engaged with patient by telephone for initial visit in response to provider referral for pharmacy case management and/or care coordination services.   Consent to Services:  The patient was given information about Chronic Care Management services, agreed to services, and gave verbal consent prior to initiation of services.  Please see initial visit note for detailed documentation.   Patient Care Team: Hali Marry, MD as PCP - General (Family Medicine) Woodroe Mode, MD (Obstetrics and Gynecology) Thornell Sartorius, MD (Otolaryngology) Nicholaus Bloom, MD (Psychiatry) Darius Bump, [facility] as Pharmacist (Pharmacist)   Objective:  Lab Results  Component Value Date   CREATININE 0.80 07/22/2020   CREATININE 0.74 10/21/2019   CREATININE 0.85 09/25/2018    Lab Results  Component Value Date   HGBA1C 6.0 (A) 07/07/2019   Last diabetic Eye exam: No results found for: HMDIABEYEEXA  Last diabetic Foot exam: No results found for: HMDIABFOOTEX      Component Value Date/Time   CHOL 212 (H) 10/21/2019 1504   CHOL 190 07/02/2017 1749   TRIG 105 10/21/2019 1504   HDL 57 10/21/2019 1504   HDL 69 07/02/2017 1749   CHOLHDL 3.7 10/21/2019 1504   VLDL 28 11/29/2015 1133   LDLCALC 134 (H) 10/21/2019 1504   LDLDIRECT 130.0 03/19/2013 1400    Hepatic Function Latest Ref Rng & Units 07/22/2020 10/21/2019 07/18/2018  Total Protein 6.1 - 8.1 g/dL 6.7 6.6 6.9  Albumin 3.6 - 4.8 g/dL - - -  AST 10 - 35 U/L 23  23 20   ALT 6 - 29 U/L 23 21 20   Alk Phosphatase 39 - 117 IU/L - - -  Total Bilirubin 0.2 - 1.2 mg/dL 0.6 0.6 0.3  Bilirubin, Direct 0.0 - 0.3 mg/dL - - -    Lab Results  Component Value Date/Time   TSH 1.73 09/25/2018 12:28 PM   TSH 0.84 07/18/2018 02:45 PM    CBC Latest Ref Rng & Units 07/22/2020 10/21/2019 11/13/2017  WBC 3.8 - 10.8 Thousand/uL 5.9 6.2 7.4  Hemoglobin 11.7 - 15.5 g/dL 14.7 10.4(L) 13.7  Hematocrit 35.0 - 45.0 % 43.0 34.2(L) 41.7  Platelets 140 - 400 Thousand/uL 355 399 418(H)    Lab Results  Component Value Date/Time   VD25OH 30.8 08/04/2016 06:24 PM   VD25OH 26 (L) 11/29/2015 11:33 AM     Social History   Tobacco Use  Smoking Status Former   Packs/day: 0.00   Pack years: 0.00   Types: Cigarettes  Smokeless Tobacco Never  Tobacco Comments   Social Smoker in her 20's   BP Readings from Last 3 Encounters:  04/09/20 136/83  04/01/20 129/75  03/05/20 125/78   Pulse Readings from Last 3 Encounters:  07/22/20 84  04/09/20 83  04/01/20 93   Wt Readings from Last 3 Encounters:  04/09/20 179 lb (81.2 kg)  04/01/20 179 lb 11.2 oz (81.5 kg)  03/05/20 183 lb 9.6 oz (83.3 kg)    Assessment: Review of patient past medical history, allergies, medications,  health status, including review of consultants reports, laboratory and other test data, was performed as part of comprehensive evaluation and provision of chronic care management services.   SDOH:  (Social Determinants of Health) assessments and interventions performed:    CCM Care Plan  Allergies  Allergen Reactions   Aspirin Other (See Comments)    GI Intolerance; History of ulcers; GI BLEED   Bee Venom Swelling    Any insects that bite or stings- swelling at site of sting or cellulitis can develop   Nsaids Other (See Comments)    History of ulcers   Vesicare [Solifenacin] Itching    Medications Reviewed Today     Reviewed by Kris Mouton (Physician Assistant  Certified) on 29/92/42 at [place] List Status: <None>   Medication Order Taking? Sig Documenting Provider Last Dose Status Informant  acetaminophen (TYLENOL) 650 MG CR tablet [id number] Yes Take 1,300 mg by mouth See admin instructions. Take 1,300 mg by mouth at bedtime and may also take an additional 1,300 mg one to two times a day as needed for pain [provider] Taking Active Self           Med Note Jens Som, Felecia Shelling   Fri Jul 23, 2020 11:16 AM) Taking mostly at bedtime, sometimes 1-2 other daily times PRN  amphetamine-dextroamphetamine (ADDERALL XR) 25 MG 24 hr capsule [id number] Yes Take 1 capsule by mouth every morning. Hali Marry, MD Taking Active   atorvastatin (LIPITOR) 40 MG tablet [id number] Yes Take 1 tablet (40 mg total) by mouth at bedtime. Hali Marry, MD Taking Active   Calcium-Vitamin D-Vitamin K 650-12.5-40 MG-MCG-MCG CHEW [id number]  Chew 2 tablets by mouth daily. [provider]  Active   cyclobenzaprine (FLEXERIL) 10 MG tablet [id number] Yes Take 1 tablet (10 mg total) by mouth at bedtime as needed (for sleep or pain). Hali Marry, MD Taking Active   docusate sodium (COLACE) 100 MG capsule [id number] Yes Take 100 mg by mouth 2 (two) times daily. [provider] Taking Active Self  DULoxetine (CYMBALTA) 30 MG capsule [id number] Yes TAKE 2 CAPSULES IN THE MORNING  AND TAKE 1 CAPSULE IN THE EVENING Hali Marry, MD Taking Active   ferrous sulfate 324 MG TBEC [id number]  Take 324 mg by mouth daily. [provider]  Active   loratadine (CLARITIN) 10 MG tablet [id number] Yes Take 1 tablet (10 mg total) by mouth daily. [name], MD Taking Active Self  Misc Natural Products (AIRBORNE [place]) CHEW [id number] Yes Chew 1 each by mouth daily. [provider] Taking Active   Multiple Vitamins-Minerals (ONE-A-DAY WOMENS 50 PLUS) TABS [id number] Yes Take 1 tablet by mouth daily. [provider]  Taking Active Self  oxybutynin (DITROPAN-XL) 10 MG 24 hr tablet [id number] Yes oxybutynin chloride ER 10 mg tablet,extended release 24 hr  TAKE 1 TABLET BY MOUTH EVERY DAY [provider] Taking Active   SYMBICORT 160-4.5 MCG/ACT inhaler [id number] Yes INHALE 2 PUFFS EVERY DAY (EXPIRES 90 DAYS AFTER OPENING) Hali Marry, MD Taking Active   Turmeric Curcumin 500 MG CAPS [id number] No Take 1,500 mg by mouth daily.   Patient not taking: No sig reported   [provider] Not Taking Active   Vibegron (GEMTESA) 75 MG TABS [id number] Yes Take 1 tablet by mouth daily. 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   IFG (impaired fasting glucose) 08/03/2017   Primary osteoarthritis of both knees 07/29/2017   Osteoarthritis, localized, shoulder, left 07/29/2017   Sinusitis, chronic    Asthma 01/08/2012   Dyslipidemia    ADD (attention deficit disorder)    Depression    Osteopenia    Incisional hernia    Stress incontinence, female     Immunization History  Administered Date(s) Administered   Fluad Quad(high Dose 65+) 10/04/2018, 10/22/2019   Influenza Inj Mdck Quad With Preservative 10/24/2016   Influenza Split 12/01/2010   Influenza, High Dose Seasonal PF 10/02/2017   Influenza,inj,Quad PF,6+ Mos 11/29/2015   Influenza-Unspecified 09/30/2012, 10/08/2013,  10/31/2014   Moderna Sars-Covid-2 Vaccination 06/27/2020   PFIZER(Purple Top)SARS-COV-2 Vaccination 02/25/2019, 03/27/2019, 11/28/2019   Pneumococcal Conjugate-13 11/29/2015   Pneumococcal Polysaccharide-23 05/28/2017   Td 10/24/2019   Tdap 12/30/2009, 10/24/2019   Zoster Recombinat (Shingrix) 11/13/2017, 10/09/2018   Zoster, Live 09/30/2012    Conditions to be addressed/monitored: HLD and chronic pain  Care Plan : Medication Management  Updates made by Darius Bump, [place] since 07/23/2020 12:00 AM     Problem: HLD, chronic pain      Long-Range Goal: Disease Progression Prevention   Start Date: 07/23/2020  This Visit's Progress: On track  Priority: High  Note:   Current Barriers:  None at present  Pharmacist Clinical Goal(s):  Over the next 180 days, patient will maintain control of chronic conditions as evidenced by medication fill history, vital signs, and lab values  through collaboration with PharmD and provider.   Interventions: 1:1 collaboration with Hali Marry, MD regarding development and update of comprehensive plan of care as evidenced by provider attestation and co-signature Inter-disciplinary care team collaboration (see longitudinal plan of care) Comprehensive medication review performed; medication list updated in electronic medical record  Hyperlipidemia:  Uncontrolled; current treatment:atorvastatin 7m;   Medications previously tried: pravastatin, simvastatin   Recommended continue current regimen, recheck updated lipid panel for 2022.   and  Chronic Pain  Controlled; current treatment: tylenol 13062m duloxetine 6041mM, 58m49m;   Recommended continue current regimen  Patient Goals/Self-Care Activities Over the next 180 days, patient will:  take medications as prescribed  Follow Up Plan: Face to Face appointment with care management team member scheduled for: 6 months     Medication Assistance:  patient states she obtained patience  [facility] for [facility] supply mailed to her house.  [id number]: [phone]: [phone] [id number]: [phone]: [phone] Up:  Patient agrees to Care Plan and Follow-up.  Plan: Face to Face appointment with care management team member scheduled for: 6 months  KeesFelecia Shelling  Jens Som

## 2020-07-27 ENCOUNTER — Other Ambulatory Visit: Payer: Self-pay

## 2020-07-27 ENCOUNTER — Ambulatory Visit: Payer: Medicare HMO | Admitting: Physical Therapy

## 2020-07-27 ENCOUNTER — Encounter: Payer: Self-pay | Admitting: Physical Therapy

## 2020-07-27 DIAGNOSIS — M6281 Muscle weakness (generalized): Secondary | ICD-10-CM

## 2020-07-27 DIAGNOSIS — R2681 Unsteadiness on feet: Secondary | ICD-10-CM

## 2020-07-27 DIAGNOSIS — R2689 Other abnormalities of gait and mobility: Secondary | ICD-10-CM | POA: Diagnosis not present

## 2020-07-27 NOTE — Therapy (Signed)
[place], [place], [address] Phone: [phone]   Fax:  [phone]  Physical Therapy Treatment  Patient Details  Name: [name] MRN: [id number] Date of Birth: [birth year] Referring Provider (PT): Levy Pupa, [place]   Encounter Date: 07/27/2020   PT End of Session - 07/27/20 1200     Visit Number 6    Number of Visits 12    Date for PT Re-Evaluation 08/16/20    Authorization Type [employer]-- requested 12 visits    Authorization - Visit Number 6    Authorization - Number of Visits 12    Progress Note Due on Visit 10    PT Start Time 1156    PT Stop Time 1230    PT Time Calculation (min) 34 min    Activity Tolerance Patient tolerated treatment well    Behavior During Therapy [facility] for tasks assessed/performed             Past Medical History:  Diagnosis Date   ADD (attention deficit disorder)    Arthritis    "bunion on  right" (08/11/2014)   Chronic bronchitis ([place])    "plenty; but not q yr" (08/11/2014)   Depression    Dyslipidemia    Environmental allergies    GERD (gastroesophageal reflux disease)    Hearing difficulty of left ear    "grew up w/deaf parent; find myself lip reading more recently" (08/11/2014)   Hepatitis    "exposed in college; rec'd gamma globulin; 6 months later S/S (weakness, lethargy, fevers); ; dx'd infectious hepatitis"   History of herpes genitalis    "stress induced reoccurance that shows up on my left middle finger" (08/11/2014)   History of hiatal hernia    History of stomach ulcers    Hypercholesterolemia    Hyperthyroidism 1994   "postpartum only; resolved itself"   Incisional hernia    Internal hemorrhoids    Leg cramping    Osteopenia    Seasonal asthma    Stress incontinence, female    Ulcer     Past Surgical History:  Procedure Laterality Date   CESAREAN SECTION  1994   CHALAZION EXCISION Left ~ 2013   DILATION AND CURETTAGE OF  UTERUS  X 2   EYE SURGERY     HERNIA REPAIR  08/11/2014   INCISIONAL HERNIA REPAIR N/A 08/11/2014   Procedure: LAPAROSCOPIC INCISIONAL HERNIA REPAIR;  Surgeon: Coralie Keens, MD;  Location: [facility];  Service: General;  Laterality: N/A;   INCONTINENCE SURGERY  2005   INGUINAL HERNIA REPAIR Left 08/11/2014   INGUINAL HERNIA REPAIR Left 08/11/2014   Procedure: LEFT INGUINAL HERNIA REPAIR;  Surgeon: Coralie Keens, MD;  Location: [facility];  Service: General;  Laterality: Left;   INSERTION OF MESH Left 08/11/2014   Procedure: INSERTION OF MESH TO LEFT GROIN AND ABDOMEN;  Surgeon: Coralie Keens, MD;  Location: [facility];  Service: General;  Laterality: Left;   LAPAROSCOPIC INCISIONAL / UMBILICAL / [place]  08/11/2014   IHR   NASAL POLYP EXCISION  2014   REPAIR OF PERFORATED ULCER  1985   TONSILLECTOMY     TOTAL SHOULDER ARTHROPLASTY Left 09/21/2017   Procedure: LEFT TOTAL SHOULDER ARTHROPLASTY;  Surgeon: Nicholes Stairs, MD;  Location: [facility];  Service: Orthopedics;  Laterality: Left;    There were no vitals filed for this visit.   Subjective Assessment - 07/27/20 1201     Subjective Pt reports she has been elevating  her feet daily.  She does her exercises with other functional activities.    Patient Stated Goals If I can stand on one leg at a time without holding, I would think that is miraculous; Be able to get up from the floor and stand up without having to put so much weight on her knees;    Currently in Pain? Yes    Pain Score 4     Pain Location Buttocks    Pain Orientation Right    Pain Descriptors / Indicators Aching    Aggravating Factors  varies    Pain Relieving Factors tylenol                [facility] PT Assessment - 07/27/20 0001       Assessment   Medical Diagnosis other abnormalities of gait    Referring Provider (PT) Levy Pupa, PA    Onset Date/Surgical Date 06/24/20    Hand Dominance Right      Functional Gait  Assessment   Gait Level Surface  Walks 20 ft in less than 5.5 sec, no assistive devices, good speed, no evidence for imbalance, normal gait pattern, deviates no more than 6 in outside of the 12 in walkway width.    Change in Gait Speed Able to smoothly change walking speed without loss of balance or gait deviation. Deviate no more than 6 in outside of the 12 in walkway width.    Gait with Horizontal Head Turns Performs head turns smoothly with no change in gait. Deviates no more than 6 in outside 12 in walkway width    Gait with Vertical Head Turns Performs head turns with no change in gait. Deviates no more than 6 in outside 12 in walkway width.    Gait and Pivot Turn Pivot turns safely within 3 sec and stops quickly with no loss of balance.    Step Over Obstacle Is able to step over 2 stacked shoe boxes taped together (9 in total height) without changing gait speed. No evidence of imbalance.    Gait with Narrow Base of Support Ambulates 7-9 steps.    Gait with Eyes Closed Walks 20 ft, uses assistive device, slower speed, mild gait deviations, deviates 6-10 in outside 12 in walkway width. Ambulates 20 ft in less than 9 sec but greater than 7 sec.    Ambulating Backwards Walks 20 ft, uses assistive device, slower speed, mild gait deviations, deviates 6-10 in outside 12 in walkway width.    Steps Alternating feet, no rail.    Total Score 27    FGA comment: 27/30               [facility] Adult PT Treatment/Exercise - 07/27/20 0001       Ambulation/Gait   Gait Comments dynamic gait activities working on head motion vertical and horizontal and tandem gait, performed backwards walking and gait with eyes closed and CGA      Lumbar Exercises: Stretches   Gastroc Stretch Right;Left;2 reps;20 seconds      Lumbar Exercises: Aerobic   Nustep L5: 5 min for warm up.      Lumbar Exercises: Standing   Heel Raises 10 reps   heel toe raises.   Other Standing Lumbar Exercises standing to single kneeling (on 3" pad) and back to standing x  4 reps each side, with UE on mat table.      Lumbar Exercises: Seated   Sit to Stand 5 reps   from low stool, cues for  posture                        PT Long Term Goals - 07/27/20 1256       PT LONG TERM GOAL #1   Title The patient will return demo HEP for LE strengthening, balance.    Time 6    Period Weeks    Status On-going      PT LONG TERM GOAL #2   Title The patient will improve functional gait assessment from 23/30 up to 27/30.    Time 6    Period Weeks    Status Achieved      PT LONG TERM GOAL #3   Title The patient will maintain single limb stance for 10 seconds R and L sides to demo improved stability.    Time 6    Period Weeks    Status Achieved      PT LONG TERM GOAL #4   Title The patient will negotiate 4 steps with reciprocal pattern ascending and descending.    Time 6    Period Weeks    Status Achieved                   Plan - 07/27/20 1255     Clinical Impression Statement Pt demonstrated improved dynamic gait; has met LTG#2.  Practiced other functional transitions with cues (and pad for knees) with good tolerance.  Pt verbalized readiness to d/c after next visit.    Rehab Potential Good    PT Frequency 2x / week    PT Duration 6 weeks    PT Treatment/Interventions Patient/family education;ADLs/Self Care Home Management;Gait training;Stair training;Neuromuscular re-education;Balance training;Therapeutic exercise;Therapeutic activities;Functional mobility training;Manual techniques;Taping    PT Next Visit Plan finalize HEP and d/c    PT Home Exercise Plan Access Code: [email or web address]    Consulted and Agree with Plan of Care Patient             Patient will benefit from skilled therapeutic intervention in order to improve the following deficits and impairments:  Decreased balance, Postural dysfunction, Decreased strength, Abnormal gait  Visit Diagnosis: Muscle weakness (generalized)  Unsteadiness on feet  Other abnormalities  of gait and mobility     Problem List Patient Active Problem List   Diagnosis Date Noted   Peripheral edema 07/22/2020   Iron deficiency 04/01/2020   Primary insomnia 08/22/2019   Obsessional thoughts 08/22/2019   Chronic constipation 07/07/2019   Leg cramps 07/07/2019   Dyspepsia 05/15/2019   Degeneration of lumbar intervertebral disc 11/28/2018   Non-seasonal allergic rhinitis 10/03/2018   OAB (overactive bladder) 07/18/2018   Hair loss 07/18/2018   Localized, primary osteoarthritis of shoulder region 04/18/2018   Low back pain 03/06/2018   Chronic musculoskeletal pain 11/13/2017   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   IFG (impaired fasting glucose) 08/03/2017   Primary osteoarthritis of both knees 07/29/2017   Osteoarthritis, localized, shoulder, left 07/29/2017   Sinusitis, chronic    Asthma 01/08/2012   Dyslipidemia    ADD (attention deficit disorder)    Depression    Osteopenia    Incisional hernia    Stress incontinence, female     Kerin Perna, PTA 07/27/20 12:59 PM  [place], [place], [address] Phone: [phone]   Fax:  [phone]  Name: [name] MRN: [id number] Date of Birth: 1950/03/01

## 2020-07-29 ENCOUNTER — Other Ambulatory Visit: Payer: Self-pay

## 2020-07-29 ENCOUNTER — Ambulatory Visit: Payer: Medicare HMO | Admitting: Rehabilitative and Restorative Service Providers"

## 2020-07-29 DIAGNOSIS — R2681 Unsteadiness on feet: Secondary | ICD-10-CM

## 2020-07-29 DIAGNOSIS — M6281 Muscle weakness (generalized): Secondary | ICD-10-CM | POA: Diagnosis not present

## 2020-07-29 DIAGNOSIS — R2689 Other abnormalities of gait and mobility: Secondary | ICD-10-CM | POA: Diagnosis not present

## 2020-07-29 NOTE — Therapy (Signed)
[place], [place], [address] Phone: [phone]   Fax:  [phone]  Physical Therapy Treatment and Discharge Summary  Patient Details  Name: [name] MRN: [id number] Date of Birth: Jul 19, 1950 Referring Provider (PT): Levy Pupa, PA  PHYSICAL THERAPY DISCHARGE SUMMARY  Visits from Start of Care: 7  Current functional level related to goals / functional outcomes: Goals achieved   Remaining deficits: R hip discomfort intermittent in nature   Education / Equipment: HEP, continuing exercise post d/c   Patient agrees to discharge. Patient goals were met. Patient is being discharged due to meeting the stated rehab goals.   Encounter Date: 07/29/2020   PT End of Session - 07/29/20 0941     Visit Number 7    Number of Visits 12    Date for PT Re-Evaluation 08/16/20    Authorization Type [employer]-- requested 12 visits    Authorization - Visit Number 7    Authorization - Number of Visits 12    Progress Note Due on Visit 10    PT Start Time [phone]    PT Stop Time 1015    PT Time Calculation (min) 38 min    Activity Tolerance Patient tolerated treatment well    Behavior During Therapy [facility] for tasks assessed/performed             Past Medical History:  Diagnosis Date   ADD (attention deficit disorder)    Arthritis    "bunion on  right" (08/11/2014)   Chronic bronchitis ([place])    "plenty; but not q yr" (08/11/2014)   Depression    Dyslipidemia    Environmental allergies    GERD (gastroesophageal reflux disease)    Hearing difficulty of left ear    "grew up w/deaf parent; find myself lip reading more recently" (08/11/2014)   Hepatitis    "exposed in college; rec'd gamma globulin; 6 months later S/S (weakness, lethargy, fevers); ; dx'd infectious hepatitis"   History of herpes genitalis    "stress induced reoccurance that shows up on my left middle finger" (08/11/2014)   History  of hiatal hernia    History of stomach ulcers    Hypercholesterolemia    Hyperthyroidism 1994   "postpartum only; resolved itself"   Incisional hernia    Internal hemorrhoids    Leg cramping    Osteopenia    Seasonal asthma    Stress incontinence, female    Ulcer     Past Surgical History:  Procedure Laterality Date   CESAREAN SECTION  1994   CHALAZION EXCISION Left ~ 2013   DILATION AND CURETTAGE OF UTERUS  X 2   EYE SURGERY     HERNIA REPAIR  08/11/2014   INCISIONAL HERNIA REPAIR N/A 08/11/2014   Procedure: LAPAROSCOPIC INCISIONAL HERNIA REPAIR;  Surgeon: Coralie Keens, MD;  Location: [facility];  Service: General;  Laterality: N/A;   INCONTINENCE SURGERY  2005   INGUINAL HERNIA REPAIR Left 08/11/2014   INGUINAL HERNIA REPAIR Left 08/11/2014   Procedure: LEFT INGUINAL HERNIA REPAIR;  Surgeon: Coralie Keens, MD;  Location: [facility];  Service: General;  Laterality: Left;   INSERTION OF MESH Left 08/11/2014   Procedure: INSERTION OF MESH TO LEFT GROIN AND ABDOMEN;  Surgeon: Coralie Keens, MD;  Location: [facility];  Service: General;  Laterality: Left;   LAPAROSCOPIC INCISIONAL / UMBILICAL / [place]  08/11/2014   IHR   NASAL POLYP EXCISION  2014   REPAIR OF  PERFORATED ULCER  1985   TONSILLECTOMY     TOTAL SHOULDER ARTHROPLASTY Left 09/21/2017   Procedure: LEFT TOTAL SHOULDER ARTHROPLASTY;  Surgeon: Nicholes Stairs, MD;  Location: [facility];  Service: Orthopedics;  Laterality: Left;    There were no vitals filed for this visit.   Subjective Assessment - 07/29/20 0939     Subjective The patient reports she did not time her Tylenol right today.  She has not been wearing compression socks due to not being out of the house much. She arrives today in flip flops.    Pertinent History R foot bunionectomy, 2nd toe on R side s/p surgery,  chronic R hip pain (she gets steroid injections), overactive bladder,    Patient Stated Goals If I can stand on one leg at a time without  holding, I would think that is miraculous; Be able to get up from the floor and stand up without having to put so much weight on her knees;    Currently in Pain? Yes    Pain Score 8     Pain Location Buttocks    Pain Orientation Right    Pain Descriptors / Indicators Aching    Pain Type Chronic pain    Pain Onset More than a month ago    Pain Frequency Intermittent    Aggravating Factors  varies    Pain Relieving Factors tylenol                [facility] PT Assessment - 07/29/20 0943       Assessment   Medical Diagnosis other abnormalities of gait    Referring Provider (PT) Levy Pupa, PA    Onset Date/Surgical Date 06/24/20      Merrilee Jansky Balance Test   Sit to Stand Able to stand without using hands and stabilize independently    Standing Unsupported Able to stand safely 2 minutes    Sitting with Back Unsupported but Feet Supported on Floor or Stool Able to sit safely and securely 2 minutes    Stand to Sit Sits safely with minimal use of hands    Transfers Able to transfer safely, minor use of hands    Standing Unsupported with Eyes Closed Able to stand 10 seconds safely    Standing Unsupported with Feet Together Able to place feet together independently and stand 1 minute safely    From Standing, Reach Forward with Outstretched Arm Can reach confidently >25 cm (10")    From Standing Position, Pick up Object from Floor Able to pick up shoe safely and easily    From Standing Position, Turn to Look Behind Over each Shoulder Looks behind from both sides and weight shifts well    Turn 360 Degrees Able to turn 360 degrees safely in 4 seconds or less    Standing Unsupported, Alternately Place Feet on Step/Stool Able to stand independently and safely and complete 8 steps in 20 seconds    Standing Unsupported, One Foot in Front Able to place foot tandem independently and hold 30 seconds    Standing on One Leg Able to lift leg independently and hold > 10 seconds    Total Score 56                            [facility] Adult PT Treatment/Exercise - 07/29/20 0943       Ambulation/Gait   Ambulation/Gait Yes    Ambulation/Gait Assistance 7: Independent    Stairs Yes  Stairs Assistance 7: Independent    Stair Management Technique Alternating pattern    Number of Stairs 14    Pre-Gait Activities stairs in building to demo 2 flights with one handrail    Gait Comments dynamic gait with horizontal head motion without loss of balance      Self-Care   Self-Care Other Self-Care Comments    Other Self-Care Comments  the patient reports she is feeling more steady overall      Neuro Re-ed    Neuro Re-ed Details  single leg standing x 15 seconds R and L sides      Exercises   Exercises Lumbar;Knee/Hip      Lumbar Exercises: Stretches   Piriformis Stretch 2 reps;30 seconds      Lumbar Exercises: Aerobic   Recumbent Bike 4 minutes level 1      Lumbar Exercises: Standing   Heel Raises 20 reps    Other Standing Lumbar Exercises tandem gait near support surface    Other Standing Lumbar Exercises single leg stance x 15 seconds bilaterally; sit to stand      Lumbar Exercises: Seated   Sit to Stand 10 reps                    PT Education - 07/29/20 0954     Education Details HEP    Person(s) Educated Patient    Methods Explanation;Demonstration;Handout    Comprehension Verbalized understanding;Returned demonstration                 PT Long Term Goals - 07/29/20 0957       PT LONG TERM GOAL #1   Title The patient will return demo HEP for LE strengthening, balance.    Time 6    Period Weeks    Status Achieved      PT LONG TERM GOAL #2   Title The patient will improve functional gait assessment from 23/30 up to 27/30.    Time 6    Period Weeks    Status Achieved      PT LONG TERM GOAL #3   Title The patient will maintain single limb stance for 10 seconds R and L sides to demo improved stability.    Time 6    Period Weeks     Status Achieved      PT LONG TERM GOAL #4   Title The patient will negotiate 4 steps with reciprocal pattern ascending and descending.    Time 6    Period Weeks    Status Achieved                   Plan - 07/29/20 0957     Clinical Impression Statement The patient has met LTGs.  She has chronic issues in the low back and R hip that she receives injections for leading to some intermittent buttocks pain.  Patient is able to return demo HEP for continued work on balance, LE strength.    PT Treatment/Interventions Patient/family education;ADLs/Self Care Home Management;Gait training;Stair training;Neuromuscular re-education;Balance training;Therapeutic exercise;Therapeutic activities;Functional mobility training;Manual techniques;Taping    PT Next Visit Plan finalize HEP and d/c    PT Home Exercise Plan Access Code: [email or web address]    Consulted and Agree with Plan of Care Patient             Patient will benefit from skilled therapeutic intervention in order to improve the following deficits and impairments:     Visit Diagnosis: Muscle weakness (generalized)  Unsteadiness  on feet  Other abnormalities of gait and mobility     Problem List Patient Active Problem List   Diagnosis Date Noted   Peripheral edema 07/22/2020   Iron deficiency 04/01/2020   Primary insomnia 08/22/2019   Obsessional thoughts 08/22/2019   Chronic constipation 07/07/2019   Leg cramps 07/07/2019   Dyspepsia 05/15/2019   Degeneration of lumbar intervertebral disc 11/28/2018   Non-seasonal allergic rhinitis 10/03/2018   OAB (overactive bladder) 07/18/2018   Hair loss 07/18/2018   Localized, primary osteoarthritis of shoulder region 04/18/2018   Low back pain 03/06/2018   Chronic musculoskeletal pain 11/13/2017   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   IFG (impaired fasting glucose) 08/03/2017   Primary osteoarthritis of both knees 07/29/2017   Osteoarthritis, localized, shoulder, left  07/29/2017   Sinusitis, chronic    Asthma 01/08/2012   Dyslipidemia    ADD (attention deficit disorder)    Depression    Osteopenia    Incisional hernia    Stress incontinence, female    Thank you for the referral of this patient. Rudell Cobb, MPT   [place], PT 07/29/2020, 2:04 PM  [facility] [place], [place], [address] Phone: [phone]   Fax:  [phone]  Name: [name] MRN: [id number] Date of Birth: September 06, 1950

## 2020-07-29 NOTE — Patient Instructions (Signed)
Access Code: [email or web address] URL: [email or web address] Date: 07/29/2020 Prepared by: Rudell Cobb  Exercises Sit to Stand - 2 x daily - 7 x weekly - 1 sets - 10 reps Single Leg Stance with Support - 2 x daily - 7 x weekly - 1 sets - 3 reps - 10-15 seconds hold Heel Toe Raises with Counter Support - 2 x daily - 7 x weekly - 1 sets - 20 reps Walking Tandem Stance - 2 x daily - 7 x weekly - 1 sets - 10 reps Seated Piriformis Stretch with Trunk Bend - 2 x daily - 7 x weekly - 1 sets - 3 reps - 30 seconds hold

## 2020-08-04 ENCOUNTER — Encounter: Payer: Self-pay | Admitting: Rehabilitative and Restorative Service Providers"

## 2020-08-06 ENCOUNTER — Encounter: Payer: Self-pay | Admitting: Physical Therapy

## 2020-08-22 ENCOUNTER — Encounter: Payer: Self-pay | Admitting: Family Medicine

## 2020-08-23 NOTE — Telephone Encounter (Signed)
Patient scheduled.

## 2020-08-24 ENCOUNTER — Other Ambulatory Visit: Payer: Self-pay

## 2020-08-24 ENCOUNTER — Encounter: Payer: Self-pay | Admitting: Family Medicine

## 2020-08-24 ENCOUNTER — Ambulatory Visit (INDEPENDENT_AMBULATORY_CARE_PROVIDER_SITE_OTHER): Payer: Medicare HMO | Admitting: Family Medicine

## 2020-08-24 VITALS — BP 127/72 | HR 90 | Ht 61.81 in | Wt 166.0 lb

## 2020-08-24 DIAGNOSIS — R238 Other skin changes: Secondary | ICD-10-CM | POA: Diagnosis not present

## 2020-08-24 MED ORDER — VALACYCLOVIR HCL 1 G PO TABS: 1000.0000 mg | ORAL_TABLET | Freq: Two times a day (BID) | ORAL | 5 refills | Status: AC

## 2020-08-24 NOTE — Progress Notes (Signed)
Acute Office Visit  Subjective:    Patient ID: [name], female    DOB: [birth year], 70 y.o.   MRN: [id number]  No chief complaint on file.   HPI Patient is in today for vesicles on finger of right hand near DIP joint.  .  This has happened before on the same fingers that popped up again last Wednesday with vesicles she has a lot of erythema and pain around it one of them on the back of the knuckle almost looks purple-colored she does have a history of oral herpes as well.  Past Medical History:  Diagnosis Date   ADD (attention deficit disorder)    Arthritis    "bunion on  right" (08/11/2014)   Chronic bronchitis ([place])    "plenty; but not q yr" (08/11/2014)   Depression    Dyslipidemia    Environmental allergies    GERD (gastroesophageal reflux disease)    Hearing difficulty of left ear    "grew up w/deaf parent; find myself lip reading more recently" (08/11/2014)   Hepatitis    "exposed in college; rec'd gamma globulin; 6 months later S/S (weakness, lethargy, fevers); ; dx'd infectious hepatitis"   History of herpes genitalis    "stress induced reoccurance that shows up on my left middle finger" (08/11/2014)   History of hiatal hernia    History of stomach ulcers    Hypercholesterolemia    Hyperthyroidism 1994   "postpartum only; resolved itself"   Incisional hernia    Internal hemorrhoids    Leg cramping    Osteopenia    Seasonal asthma    Stress incontinence, female    Ulcer     Past Surgical History:  Procedure Laterality Date   CESAREAN SECTION  1994   CHALAZION EXCISION Left ~ 2013   DILATION AND CURETTAGE OF UTERUS  X 2   EYE SURGERY     HERNIA REPAIR  08/11/2014   INCISIONAL HERNIA REPAIR N/A 08/11/2014   Procedure: LAPAROSCOPIC INCISIONAL HERNIA REPAIR;  Surgeon: Coralie Keens, MD;  Location: [facility];  Service: General;  Laterality: N/A;   INCONTINENCE SURGERY  2005   INGUINAL HERNIA REPAIR Left 08/11/2014   INGUINAL HERNIA REPAIR Left 08/11/2014    Procedure: LEFT INGUINAL HERNIA REPAIR;  Surgeon: Coralie Keens, MD;  Location: [facility];  Service: General;  Laterality: Left;   INSERTION OF MESH Left 08/11/2014   Procedure: INSERTION OF MESH TO LEFT GROIN AND ABDOMEN;  Surgeon: Coralie Keens, MD;  Location: [facility];  Service: General;  Laterality: Left;   LAPAROSCOPIC INCISIONAL / UMBILICAL / [place]  08/11/2014   IHR   NASAL POLYP EXCISION  2014   REPAIR OF PERFORATED ULCER  1985   TONSILLECTOMY     TOTAL SHOULDER ARTHROPLASTY Left 09/21/2017   Procedure: LEFT TOTAL SHOULDER ARTHROPLASTY;  Surgeon: Nicholes Stairs, MD;  Location: [facility];  Service: Orthopedics;  Laterality: Left;    Family History  Problem Relation Age of Onset   Arthritis Mother    Heart disease Mother        CABG @ 44   Diabetes Mother    Osteoporosis Mother    Hyperlipidemia Mother    Arthritis Father    Lymphoma Father 42       chemo   Cancer Father        lymphoma   Lymphoma Paternal Grandmother    Diabetes Maternal Grandmother    Colon cancer Neg Hx    Rectal cancer Neg  Hx    Esophageal cancer Neg Hx     Social History   Socioeconomic History   Marital status: Single    Spouse name: Not on file   Number of children: 1   Years of education: 16   Highest education level: Bachelor's degree (e.g., BA, AB, BS)  Occupational History   Occupation: Consulting civil engineer    Comment: Full time; Barista  Tobacco Use   Smoking status: Former    Packs/day: 0.00    Types: Cigarettes   Smokeless tobacco: Never   Tobacco comments:    Social Smoker in her 23's  Vaping Use   Vaping Use: Never used  Substance and Sexual Activity   Alcohol use: Yes    Alcohol/week: 2.0 - 4.0 standard drinks    Types: 2 - 4 Standard drinks or equivalent per week   Drug use: No   Sexual activity: Not Currently    Birth control/protection: Post-menopausal  Other Topics Concern   Not on file  Social History Narrative   Marital status:  divorced; not dating      Children: 1 daughter; no grandchildren      Lives:  In senior living apartment      Employment: Optometrist      Tobacco: used only socially in early 20's- never a regular user      Alcohol: 3-4 drinks a week- spread out      Drugs: never      Exercise: stretching exercises every day 15-30mn- no vigorous activity   Social Determinants of HRadio broadcast assistantStrain: Low Risk    Difficulty of Paying Living Expenses: Not hard at all  Food Insecurity: No Food Insecurity   Worried About RCharity fundraiserin the Last Year: Never true   RNewbernin the Last Year: Never true  Transportation Needs: No Transportation Needs   Lack of Transportation (Medical): No   Lack of Transportation (Non-Medical): No  Physical Activity: Inactive   Days of Exercise per Week: 0 days   Minutes of Exercise per Session: 0 min  Stress: No Stress Concern Present   Feeling of Stress : Not at all  Social Connections: Moderately Integrated   Frequency of Communication with Friends and Family: More than three times a week   Frequency of Social Gatherings with Friends and Family: More than three times a week   Attends Religious Services: More than 4 times per year   Active Member of [employer] Organizations: Yes   Attends CMusic therapist More than 4 times per year   Marital Status: Divorced  IHuman resources officerViolence: Not At Risk   Fear of Current or Ex-Partner: No   Emotionally Abused: No   Physically Abused: No   Sexually Abused: No    Outpatient Medications Prior to Visit  Medication Sig Dispense Refill   acetaminophen (TYLENOL) 650 MG CR tablet Take 1,300 mg by mouth See admin instructions. Take 1,300 mg by mouth at bedtime and may also take an additional 1,300 mg one to two times a day as needed for pain     amoxicillin (AMOXIL) 500 MG tablet amoxicillin 500 mg tablet  TAKE 4 TABLETS BY MOUTH 1 TO 2 HOURS BEFORE DENTAL WORK      amphetamine-dextroamphetamine (ADDERALL XR) 25 MG 24 hr capsule Take 1 capsule by mouth every morning. 90 capsule 0   atorvastatin (LIPITOR) 40 MG tablet Take 1 tablet (40 mg total) by mouth at  bedtime. 90 tablet 3   Calcium-Vitamin D-Vitamin K 650-12.5-40 MG-MCG-MCG CHEW Chew 2 tablets by mouth daily.     cyclobenzaprine (FLEXERIL) 10 MG tablet Take 1 tablet (10 mg total) by mouth at bedtime as needed (for sleep or pain). 90 tablet 1   docusate sodium (COLACE) 100 MG capsule Take 100 mg by mouth 2 (two) times daily.     DULoxetine (CYMBALTA) 30 MG capsule TAKE 2 CAPSULES IN THE MORNING  AND TAKE 1 CAPSULE IN THE EVENING 270 capsule 1   ferrous sulfate 325 (65 FE) MG EC tablet ferrous sulfate 325 mg (65 mg iron) tablet,delayed release     loratadine (CLARITIN) 10 MG tablet Take 1 tablet (10 mg total) by mouth daily. 90 tablet 3   Misc Natural Products (AIRBORNE ELDERBERRY) CHEW Chew 1 each by mouth daily.     MODERNA COVID-19 VACCINE 100 MCG/0.5ML injection      Multiple Vitamin (MULTIVITAMINS PO) Multivitamin 50 Plus     oxybutynin (DITROPAN-XL) 10 MG 24 hr tablet oxybutynin chloride ER 10 mg tablet,extended release 24 hr  TAKE 1 TABLET BY MOUTH EVERY DAY     SYMBICORT 160-4.5 MCG/ACT inhaler INHALE 2 PUFFS EVERY DAY (EXPIRES 90 DAYS AFTER OPENING) 2 each 1   Turmeric Curcumin 500 MG CAPS Take 1,500 mg by mouth daily.  (Patient not taking: No sig reported)     Vibegron (GEMTESA) 75 MG TABS Take 1 tablet by mouth daily.     ferrous sulfate 324 MG TBEC Take 324 mg by mouth daily.     Multiple Vitamins-Minerals (ONE-A-DAY WOMENS 50 PLUS) TABS Take 1 tablet by mouth daily.     No facility-administered medications prior to visit.    Allergies  Allergen Reactions   Aspirin Other (See Comments)    GI Intolerance; History of ulcers; GI BLEED   Bee Venom Swelling    Any insects that bite or stings- swelling at site of sting or cellulitis can develop   Nsaids Other (See Comments)    History of  ulcers   Vesicare [Solifenacin] Itching    Review of Systems     Objective:    Physical Exam  She has 3 large vesicles on the palmar side near the DIP joint.  I did lance 2 of them to get the viral sample.  She has a smaller vesicle on the posterior DIP joint same finger.  BP 127/72   Pulse 90   Ht 5' 1.81" (1.57 m)   Wt 166 lb (75.3 kg)   LMP 08/30/2008   SpO2 97%   BMI 30.55 kg/m  Wt Readings from Last 3 Encounters:  08/24/20 166 lb (75.3 kg)  04/09/20 179 lb (81.2 kg)  04/01/20 179 lb 11.2 oz (81.5 kg)    There are no preventive care reminders to display for this patient.  There are no preventive care reminders to display for this patient.   Lab Results  Component Value Date   TSH 1.73 09/25/2018   Lab Results  Component Value Date   WBC 5.9 07/22/2020   HGB 14.7 07/22/2020   HCT 43.0 07/22/2020   MCV 91.5 07/22/2020   PLT 355 07/22/2020   Lab Results  Component Value Date   NA 142 07/22/2020   K 4.8 07/22/2020   CO2 29 07/22/2020   GLUCOSE 101 (H) 07/22/2020   BUN 16 07/22/2020   CREATININE 0.80 07/22/2020   BILITOT 0.6 07/22/2020   ALKPHOS 104 07/02/2017   AST 23 07/22/2020   ALT  23 07/22/2020   PROT 6.7 07/22/2020   ALBUMIN 4.3 07/02/2017   CALCIUM 9.7 07/22/2020   ANIONGAP 7 09/22/2017   GFR 74.97 03/19/2013   Lab Results  Component Value Date   CHOL 212 (H) 10/21/2019   Lab Results  Component Value Date   HDL 57 10/21/2019   Lab Results  Component Value Date   LDLCALC 134 (H) 10/21/2019   Lab Results  Component Value Date   TRIG 105 10/21/2019   Lab Results  Component Value Date   CHOLHDL 3.7 10/21/2019   Lab Results  Component Value Date   HGBA1C 6.0 (A) 07/07/2019       Assessment & Plan:   Problem List Items Addressed This Visit   None Visit Diagnoses     Vesicle of skin    -  Primary   Relevant Medications   valACYclovir (VALTREX) 1000 MG tablet      Vesicle of skin most consistent with possible herpetic  whitlow.  Viral culture obtained.  Will call with results once available we had actually tried to call culture it previously and it was canceled the bacterial culture was negative.  In the meantime I am getting go ahead and treat her with valacyclovir.  Meds ordered this encounter  Medications   valACYclovir (VALTREX) 1000 MG tablet    Sig: Take 1 tablet (1,000 mg total) by mouth 2 (two) times daily for 7 days.    Dispense:  14 tablet    Refill:  5     Beatrice Lecher, MD

## 2020-08-24 NOTE — Addendum Note (Signed)
Addended by: Teddy Spike on: 08/24/2020 04:46 PM   Modules accepted: Orders

## 2020-08-26 ENCOUNTER — Encounter: Payer: Self-pay | Admitting: Family Medicine

## 2020-08-26 DIAGNOSIS — K4090 Unilateral inguinal hernia, without obstruction or gangrene, not specified as recurrent: Secondary | ICD-10-CM | POA: Insufficient documentation

## 2020-09-01 LAB — VARICELLA ZOSTER VIRUS,RAPID METHOD,CULTURE

## 2020-09-02 ENCOUNTER — Encounter: Payer: Self-pay | Admitting: Family Medicine

## 2020-09-03 DIAGNOSIS — N3946 Mixed incontinence: Secondary | ICD-10-CM | POA: Diagnosis not present

## 2020-09-03 DIAGNOSIS — R350 Frequency of micturition: Secondary | ICD-10-CM | POA: Diagnosis not present

## 2020-09-07 ENCOUNTER — Telehealth: Payer: Self-pay | Admitting: *Deleted

## 2020-09-07 NOTE — Telephone Encounter (Signed)
Pt called and stated that from 2-4 AM this morning she experienced belching,no relief fever (100.4) last night (99). She took ES Tylenol for fever however the fevers really never subsided.    She reports that  her sxs actually started 4 days ago after she was seen by the urologist for OAB. They tested her urine to see if she had an infection but gave her Bactrim to start taking in the interim until the UCx comes back.   She stated that she has had pain in arm pit, back aches, She feels that she is allergic to  the ABX. Due to the sxs that she was having she has taken several COVID tests and all of which came back negative.   She was finally able to eat a little something today.   She stated that she would give the Urologist office a call to inform them of what she has been experiencing and find out her results.   I did advise her to stop taking the Bactrim since she is having this reaction to it.   Her only sxs have been low grade fever,body aches, belching, congestion. It doesn't sound like she has COVID.

## 2020-09-08 ENCOUNTER — Encounter: Payer: Self-pay | Admitting: Family Medicine

## 2020-09-10 ENCOUNTER — Telehealth: Payer: Self-pay

## 2020-09-10 ENCOUNTER — Ambulatory Visit: Payer: Medicare HMO | Admitting: Family Medicine

## 2020-09-10 NOTE — Telephone Encounter (Signed)
Pt called stating that her urologist changed her ABX to Cipro and that she is feeling much better.  Pt wanted to know if she could cancel her appointment today without incurring a charge.  Advised pt that she would not incur a charge and that if her symptoms returned after completing the ABX, she should call us back to reschedule her appointment.  Pt expressed understanding and is agreeable.  Charyl Bigger, CMA

## 2020-09-21 ENCOUNTER — Other Ambulatory Visit: Payer: Self-pay | Admitting: Family Medicine

## 2020-09-21 DIAGNOSIS — M7918 Myalgia, other site: Secondary | ICD-10-CM

## 2020-09-21 DIAGNOSIS — G8929 Other chronic pain: Secondary | ICD-10-CM

## 2020-10-08 ENCOUNTER — Other Ambulatory Visit: Payer: Self-pay | Admitting: *Deleted

## 2020-10-08 ENCOUNTER — Ambulatory Visit (INDEPENDENT_AMBULATORY_CARE_PROVIDER_SITE_OTHER): Payer: Medicare HMO | Admitting: Family Medicine

## 2020-10-08 VITALS — BP 136/80 | HR 86 | Wt 166.0 lb

## 2020-10-08 DIAGNOSIS — Z23 Encounter for immunization: Secondary | ICD-10-CM

## 2020-10-08 DIAGNOSIS — Z0000 Encounter for general adult medical examination without abnormal findings: Secondary | ICD-10-CM

## 2020-10-08 DIAGNOSIS — F901 Attention-deficit hyperactivity disorder, predominantly hyperactive type: Secondary | ICD-10-CM

## 2020-10-08 NOTE — Patient Instructions (Addendum)
[place] Maintenance Summary and Written Plan of Care  [name] ,  Thank you for allowing me to perform your Medicare Annual Wellness Visit and for your ongoing commitment to your health.   Health Maintenance & Immunization History Health Maintenance  Topic Date Due   COVID-19 Vaccine (5 - Booster for [name]) 10/28/2020   MAMMOGRAM  10/22/2021   COLONOSCOPY (Pts 45-30yr Insurance coverage will need to be confirmed)  01/22/2028   TETANUS/TDAP  10/23/2029   INFLUENZA VACCINE  Completed   DEXA SCAN  Completed   Hepatitis C Screening  Completed   PNA vac Low Risk Adult  Completed   Zoster Vaccines- Shingrix  Completed   HPV VACCINES  Aged Out   Immunization History  Administered Date(s) Administered   Fluad Quad(high Dose 65+) 10/04/2018, 10/22/2019, 10/08/2020   Influenza Inj Mdck Quad With Preservative 10/24/2016   Influenza Split 12/01/2010   Influenza, High Dose Seasonal PF 10/02/2017   Influenza,inj,Quad PF,6+ Mos 11/29/2015   Influenza-Unspecified 09/30/2012, 10/08/2013, 10/31/2014   Moderna Sars-Covid-2 Vaccination 06/27/2020   PFIZER(Purple Top)SARS-COV-2 Vaccination 02/25/2019, 03/27/2019, 11/28/2019   Pneumococcal Conjugate-13 11/29/2015   Pneumococcal Polysaccharide-23 05/28/2017   Td 10/24/2019   Tdap 12/30/2009, 10/24/2019   Zoster Recombinat (Shingrix) 11/13/2017, 10/09/2018   Zoster, Live 09/30/2012    These are the patient goals that we discussed:  Goals Addressed               This Visit's Progress     Patient Stated (pt-stated)        10/08/2020 AWV Goal: Exercise for General Health  Patient will verbalize understanding of the benefits of increased physical activity: Exercising regularly is important. It will improve your overall fitness, flexibility, and endurance. Regular exercise also will improve your overall health. It can help you control your weight, reduce stress, and improve your bone density. Over the  next year, patient will increase physical activity as tolerated with a goal of at least 150 minutes of moderate physical activity per week.  You can tell that you are exercising at a moderate intensity if your heart starts beating faster and you start breathing faster but can still hold a conversation. Moderate-intensity exercise ideas include: Walking 1 mile (1.6 km) in about 15 minutes Biking Hiking Golfing Dancing Water aerobics Patient will verbalize understanding of everyday activities that increase physical activity by providing examples like the following: Yard work, such as: PSales promotion account executiveGardening Washing windows or floors Patient will be able to explain general safety guidelines for exercising:  Before you start a new exercise program, talk with your health care provider. Do not exercise so much that you hurt yourself, feel dizzy, or get very short of breath. Wear comfortable clothes and wear shoes with good support. Drink plenty of water while you exercise to prevent dehydration or heat stroke. Work out until your breathing and your heartbeat get faster.          This is a list of Health Maintenance Items that are overdue or due now: Influenza vaccine Screening mammography - patient [place] the [place] today [place]    Orders/Referrals Placed Today: Orders Placed This Encounter  Procedures   Flu Vaccine QUAD High Dose(Fluad)   (Contact our referral department at [phone] you have not spoken with someone about your referral appointment within the next 5 days)    Follow-up Plan  Follow-up with Cynthia Marry, MD as planned Medicare wellness visit in one year. AVS printed and given to patient.

## 2020-10-08 NOTE — Progress Notes (Signed)
MEDICARE ANNUAL WELLNESS VISIT  10/08/2020  Subjective:  [name] is a 69 y.o. female patient of Metheney, Rene Kocher, MD who had a Medicare Annual Wellness Visit today. [name] is Retired and lives alone. she has 1 child. she reports that she is socially active and does interact with friends/family regularly. she is minimally physically active and enjoys travel and talking on th phone with friends.  Patient Care Team: Cynthia Marry, MD as PCP - General (Family Medicine) Cynthia Mode, MD (Obstetrics and Gynecology) Cynthia Sartorius, MD (Otolaryngology) Cynthia Bloom, MD (Psychiatry) [name], [facility] as Pharmacist (Pharmacist)  Advanced Directives 10/08/2020 07/05/2020 09/29/2019 09/25/2018 10/08/2017 09/21/2017 09/10/2017  Does Patient Have a Medical Advance Directive? No No No No Yes Yes No  Type of Advance Directive - - - - Catering manager -  Does patient want to make changes to medical advance directive? - - - - - Yes (Inpatient - patient requests chaplain consult to change a medical advance directive) -  Copy of [place] in Chart? - - - - Yes Yes -  Would patient like information on creating a medical advance directive? No - Patient declined Yes (MAU/Ambulatory/Procedural Areas - Information given) No - Patient declined Yes (MAU/Ambulatory/Procedural Areas - Information given) Yes (Inpatient - patient requests chaplain consult to create a medical advance directive) Yes (Inpatient - patient requests chaplain consult to create a medical advance directive) No - Patient declined    Hospital Utilization Over the Past 12 Months: # of hospitalizations or ER visits: 0 # of surgeries: 0  Review of Systems    Patient reports that her overall health is better when compared to last year.  Review of Systems: History obtained from chart review and the patient  All other systems negative.  Pain Assessment Pain :  No/denies pain     Current Medications & Allergies (verified) Allergies as of 10/08/2020       Reactions   Aspirin Other (See Comments)   GI Intolerance; History of ulcers; GI BLEED   Bee Venom Swelling   Any insects that bite or stings- swelling at site of sting or cellulitis can develop   Nsaids Other (See Comments)   History of ulcers   Vesicare [solifenacin] Itching   Bactrim [sulfamethoxazole-trimethoprim] Other (See Comments)   Belching, body aches, fever        Medication List        Accurate as of October 08, 2020 12:07 PM. If you have any questions, ask your nurse or doctor.          acetaminophen 650 MG CR tablet Commonly known as: TYLENOL Take 1,300 mg by mouth See admin instructions. Take 1,300 mg by mouth at bedtime and may also take an additional 1,300 mg one to two times a day as needed for pain   ALBUTEROL IN Inhale into the lungs.   amoxicillin 500 MG tablet Commonly known as: AMOXIL amoxicillin 500 mg tablet  TAKE 4 TABLETS BY MOUTH 1 TO 2 HOURS BEFORE DENTAL WORK   amphetamine-dextroamphetamine 25 MG 24 hr capsule Commonly known as: ADDERALL XR Take 1 capsule by mouth every morning.   atorvastatin 40 MG tablet Commonly known as: LIPITOR Take 1 tablet (40 mg total) by mouth at bedtime.   Calcium-Vitamin D-Vitamin K 650-12.5-40 MG-MCG-MCG Chew Chew 2 tablets by mouth daily.   cyclobenzaprine 10 MG tablet Commonly known as: FLEXERIL Take 1 tablet (10 mg total) by  mouth at bedtime as needed (sleep or pain).   docusate sodium 100 MG capsule Commonly known as: COLACE Take 100 mg by mouth 2 (two) times daily.   DULoxetine 30 MG capsule Commonly known as: CYMBALTA TAKE 2 CAPSULES IN THE MORNING  AND TAKE 1 CAPSULE IN THE EVENING What changed: See the new instructions.   ferrous sulfate 325 (65 FE) MG EC tablet ferrous sulfate 325 mg (65 mg iron) tablet,delayed release   Gemtesa 75 MG Tabs Generic drug: Vibegron Take 1 tablet by mouth  daily.   ipratropium 0.03 % nasal spray Commonly known as: ATROVENT Place 2 sprays into both nostrils every 12 (twelve) hours. As needed.   loratadine 10 MG tablet Commonly known as: CLARITIN Take 1 tablet (10 mg total) by mouth daily.   Moderna COVID-19 Vaccine 100 MCG/0.5ML injection Generic drug: COVID-19 mRNA vaccine (Moderna)   Multivitamin Adult (Minerals) Tabs   oxybutynin 10 MG 24 hr tablet Commonly known as: DITROPAN-XL oxybutynin chloride ER 10 mg tablet,extended release 24 hr  TAKE 1 TABLET BY MOUTH EVERY DAY   SAMBUCUS ELDERBERRY PO   Symbicort 160-4.5 MCG/ACT inhaler Generic drug: budesonide-formoterol INHALE 2 PUFFS EVERY DAY (EXPIRES 90 DAYS AFTER OPENING) What changed: See the new instructions.        History (reviewed): Past Medical History:  Diagnosis Date   ADD (attention deficit disorder)    Arthritis    "bunion on  right" (08/11/2014)   Chronic bronchitis ([place])    "plenty; but not q yr" (08/11/2014)   Depression    Dyslipidemia    Environmental allergies    GERD (gastroesophageal reflux disease)    Hearing difficulty of left ear    "grew up w/deaf parent; find myself lip reading more recently" (08/11/2014)   Hepatitis    "exposed in college; rec'd gamma globulin; 6 months later S/S (weakness, lethargy, fevers); ; dx'd infectious hepatitis"   History of herpes genitalis    "stress induced reoccurance that shows up on my left middle finger" (08/11/2014)   History of hiatal hernia    History of stomach ulcers    Hypercholesterolemia    Hyperthyroidism 1994   "postpartum only; resolved itself"   Incisional hernia    Internal hemorrhoids    Leg cramping    Osteopenia    Seasonal asthma    Stress incontinence, female    Ulcer    Past Surgical History:  Procedure Laterality Date   CESAREAN SECTION  1994   CHALAZION EXCISION Left ~ 2013   DILATION AND CURETTAGE OF UTERUS  X 2   EYE SURGERY     HERNIA REPAIR  08/11/2014   INCISIONAL HERNIA  REPAIR N/A 08/11/2014   Procedure: LAPAROSCOPIC INCISIONAL HERNIA REPAIR;  Surgeon: Coralie Keens, MD;  Location: [facility];  Service: General;  Laterality: N/A;   INCONTINENCE SURGERY  2005   INGUINAL HERNIA REPAIR Left 08/11/2014   INGUINAL HERNIA REPAIR Left 08/11/2014   Procedure: LEFT INGUINAL HERNIA REPAIR;  Surgeon: Coralie Keens, MD;  Location: [facility];  Service: General;  Laterality: Left;   INSERTION OF MESH Left 08/11/2014   Procedure: INSERTION OF MESH TO LEFT GROIN AND ABDOMEN;  Surgeon: Coralie Keens, MD;  Location: [facility];  Service: General;  Laterality: Left;   LAPAROSCOPIC INCISIONAL / UMBILICAL / [place]  08/11/2014   I[facility]   NASAL POLYP EXCISION  2014   REPAIR OF PERFORATED ULCER  1985   TONSILLECTOMY     TOTAL SHOULDER ARTHROPLASTY Left 09/21/2017  Procedure: LEFT TOTAL SHOULDER ARTHROPLASTY;  Surgeon: Nicholes Stairs, MD;  Location: [facility];  Service: Orthopedics;  Laterality: Left;   Family History  Problem Relation Age of Onset   Arthritis Mother    Heart disease Mother        CABG @ 59   Diabetes Mother    Osteoporosis Mother    Hyperlipidemia Mother    Arthritis Father    Lymphoma Father 63       chemo   Cancer Father        lymphoma   Lymphoma Paternal Grandmother    Diabetes Maternal Grandmother    Colon cancer Neg Hx    Rectal cancer Neg Hx    Esophageal cancer Neg Hx    Social History   Socioeconomic History   Marital status: Single    Spouse name: Not on file   Number of children: 1   Years of education: 16   Highest education level: Bachelor's degree (e.g., BA, AB, BS)  Occupational History   Occupation: Consulting civil engineer    Comment: Full time; Barista   Occupation: Retired  Tobacco Use   Smoking status: Former    Packs/day: 0.00    Types: Cigarettes   Smokeless tobacco: Never   Tobacco comments:    Social Smoker in her 20's  Vaping Use   Vaping Use: Never used  Substance and Sexual Activity    Alcohol use: Not Currently    Alcohol/week: 1.0 standard drink    Types: 1 Glasses of wine per week   Drug use: No   Sexual activity: Not Currently    Birth control/protection: Post-menopausal  Other Topics Concern   Not on file  Social History Narrative   Lives alone. She has one daughter. She likes to travel, puzzles and talking on the phone.   Social Determinants of Health   Financial Resource Strain: Low Risk    Difficulty of Paying Living Expenses: Not hard at all  Food Insecurity: No Food Insecurity   Worried About Charity fundraiser in the Last Year: Never true   [place] in the Last Year: Never true  Transportation Needs: No Transportation Needs   Lack of Transportation (Medical): No   Lack of Transportation (Non-Medical): No  Physical Activity: Inactive   Days of Exercise per Week: 0 days   Minutes of Exercise per Session: 0 min  Stress: No Stress Concern Present   Feeling of Stress : Not at all  Social Connections: Moderately Integrated   Frequency of Communication with Friends and Family: More than three times a week   Frequency of Social Gatherings with Friends and Family: More than three times a week   Attends Religious Services: More than 4 times per year   Active Member of [employer] or Organizations: Yes   Attends Archivist Meetings: More than 4 times per year   Marital Status: Divorced    Activities of Daily Living In your present state of health, do you have any difficulty performing the following activities: 10/08/2020  Hearing? N  Vision? N  Difficulty concentrating or making decisions? Y  Comment has noticed some memory issues.  Walking or climbing stairs? N  Dressing or bathing? N  Doing errands, shopping? N  Preparing Food and eating ? N  Using the Toilet? N  In the past six months, have you accidently leaked urine? Y  Comment Vastly improved, she is currently on medication.  Do you have problems with  loss of bowel control? N   Managing your Medications? N  Managing your Finances? N  Housekeeping or managing your Housekeeping? N  Some recent data might be hidden    Patient Education/Literacy How often do you need to have someone help you when you read instructions, pamphlets, or other written materials from your doctor or pharmacy?: 1 - Never What is the last grade level you completed in school?: Bachelor's Degree  Exercise Current Exercise Habits: Home exercise routine, Type of exercise: stretching, Time (Minutes): 15, Frequency (Times/Week): 3, Weekly Exercise (Minutes/Week): 45, Intensity: Mild, Exercise limited by: orthopedic condition(s)  Diet Patient reports consuming 1 meals a day and 1-3 snack(s) a day Patient reports that her primary diet is: Regular Patient reports that she does have regular access to food.   Depression Screen PHQ 2/9 Scores 10/08/2020 10/22/2019 09/29/2019 04/17/2019 04/17/2019 04/17/2019 10/04/2018  PHQ - 2 Score 0 1 2 0 0 0 0  PHQ- 9 Score - - 8 5 - - 9     Fall Risk Fall Risk  10/08/2020 10/22/2019 09/29/2019 10/03/2018 09/25/2018  Falls in the past year? '1 1 1 1 1  '$ Number falls in past yr: '1 1 1 '$ 0 0  Injury with Fall? 1 0 0 0 0  Risk for fall due to : History of fall(s);Impaired balance/gait Impaired balance/gait - Impaired balance/gait Impaired balance/gait  Follow up Falls evaluation completed;Education provided;Falls prevention discussed - Falls evaluation completed Falls prevention discussed Falls prevention discussed     Objective:   Wt 166 lb 0.6 oz (75.3 kg)   LMP 08/30/2008   BMI 30.56 kg/m   Last Weight  Most recent update: 10/08/2020 11:19 AM    Weight  75.3 kg (166 lb 0.6 oz)             Body mass index is 30.56 kg/m.  Hearing/Vision  Mekenzi did not have difficulty with hearing/understanding during the face-to-face interview Adree did not have difficulty with her vision during the face-to-face interview Reports that she has not had a formal eye exam by an eye  care professional within the past year Reports that she has not had a formal hearing evaluation within the past year  Cognitive Function: 6CIT Screen 10/08/2020 09/29/2019 09/25/2018  What Year? 0 points 0 points 0 points  What month? 0 points 0 points 0 points  What time? 0 points 0 points 0 points  Count back from 20 0 points 0 points 0 points  Months in reverse 0 points 0 points 0 points  Repeat phrase 0 points 0 points 0 points  Total Score 0 0 0    Normal Cognitive Function Screening: Yes (Normal:0-7, Significant for Dysfunction: >8)  Immunization & Health Maintenance Record Immunization History  Administered Date(s) Administered   Fluad Quad(high Dose 65+) 10/04/2018, 10/22/2019, 10/08/2020   Influenza Inj Mdck Quad With Preservative 10/24/2016   Influenza Split 12/01/2010   Influenza, High Dose Seasonal PF 10/02/2017   Influenza,inj,Quad PF,6+ Mos 11/29/2015   Influenza-Unspecified 09/30/2012, 10/08/2013, 10/31/2014   Moderna Sars-Covid-2 Vaccination 06/27/2020   PFIZER(Purple Top)SARS-COV-2 Vaccination 02/25/2019, 03/27/2019, 11/28/2019   Pneumococcal Conjugate-13 11/29/2015   Pneumococcal Polysaccharide-23 05/28/2017   Td 10/24/2019   Tdap 12/30/2009, 10/24/2019   Zoster Recombinat (Shingrix) 11/13/2017, 10/09/2018   Zoster, Live 09/30/2012    Health Maintenance  Topic Date Due   COVID-19 Vaccine (5 - Booster for [place] series) 10/28/2020   MAMMOGRAM  10/22/2021   COLONOSCOPY (Pts 45-56yr Insurance coverage will need to be confirmed)  01/22/2028  TETANUS/TDAP  10/23/2029   INFLUENZA VACCINE  Completed   DEXA SCAN  Completed   Hepatitis C Screening  Completed   PNA vac Low Risk Adult  Completed   Zoster Vaccines- Shingrix  Completed   HPV VACCINES  Aged Out       Assessment  This is a routine wellness examination for [facility] [employer].  Health Maintenance: Due or Overdue There are no preventive care reminders to display for this patient.   Wylene Simmer  does not need a referral for Community Assistance: Care Management:   no Social Work:    no Prescription Assistance:  no Nutrition/Diabetes Education:  no   Plan:  Personalized Goals  Goals Addressed               This Visit's Progress     Patient Stated (pt-stated)        10/08/2020 AWV Goal: Exercise for General Health  Patient will verbalize understanding of the benefits of increased physical activity: Exercising regularly is important. It will improve your overall fitness, flexibility, and endurance. Regular exercise also will improve your overall health. It can help you control your weight, reduce stress, and improve your bone density. Over the next year, patient will increase physical activity as tolerated with a goal of at least 150 minutes of moderate physical activity per week.  You can tell that you are exercising at a moderate intensity if your heart starts beating faster and you start breathing faster but can still hold a conversation. Moderate-intensity exercise ideas include: Walking 1 mile (1.6 km) in about 15 minutes Biking Hiking Golfing Dancing Water aerobics Patient will verbalize understanding of everyday activities that increase physical activity by providing examples like the following: Yard work, such as: Sales promotion account executive Gardening Washing windows or floors Patient will be able to explain general safety guidelines for exercising:  Before you start a new exercise program, talk with your health care provider. Do not exercise so much that you hurt yourself, feel dizzy, or get very short of breath. Wear comfortable clothes and wear shoes with good support. Drink plenty of water while you exercise to prevent dehydration or heat stroke. Work out until your breathing and your heartbeat get faster.        Personalized Health Maintenance & Screening Recommendations   Influenza vaccine Screening mammography - patient declined the referral today because she is moving to [place].   Lung Cancer Screening Recommended: no (Low Dose CT Chest recommended if Age 47-80 years, 30 pack-year currently smoking OR have quit w/in past 15 years) Hepatitis C Screening recommended: no HIV Screening recommended: no  Advanced Directives: Written information was not given per the patient's request.  Referrals & Orders Orders Placed This Encounter  Procedures   Flu Vaccine QUAD High Dose(Fluad)     Follow-up Plan Follow-up with Cynthia Marry, MD as planned Medicare wellness visit in one year. AVS printed and given to patient.   I have personally reviewed and noted the following in the patient's chart:   Medical and social history Use of alcohol, tobacco or illicit drugs  Current medications and supplements Functional ability and status Nutritional status Physical activity Advanced directives List of other physicians Hospitalizations, surgeries, and ER visits in previous 12 months Vitals Screenings to include cognitive, depression, and falls Referrals and appointments  In addition, I have reviewed and discussed with patient certain preventive protocols, quality metrics,  and best practice recommendations. A written personalized care plan for preventive services as well as general preventive health recommendations were provided to patient.     Tinnie Gens, RN  10/08/2020

## 2020-10-15 ENCOUNTER — Other Ambulatory Visit: Payer: Self-pay | Admitting: *Deleted

## 2020-10-15 DIAGNOSIS — F901 Attention-deficit hyperactivity disorder, predominantly hyperactive type: Secondary | ICD-10-CM

## 2020-10-15 MED ORDER — AMPHETAMINE-DEXTROAMPHET ER 25 MG PO CP24: 25.0000 mg | ORAL_CAPSULE | ORAL | 0 refills | Status: DC

## 2020-10-15 NOTE — Telephone Encounter (Signed)
[name] need a 90 day of Adderall sent to [facility]. She has an appointment on Monday. She needs this medication today.

## 2020-10-15 NOTE — Addendum Note (Signed)
Addended by: Narda Rutherford on: 10/15/2020 11:38 AM   Modules accepted: Orders

## 2020-10-18 ENCOUNTER — Ambulatory Visit: Payer: Medicare HMO | Admitting: Family Medicine

## 2020-10-21 ENCOUNTER — Other Ambulatory Visit: Payer: Self-pay

## 2020-10-21 ENCOUNTER — Ambulatory Visit (INDEPENDENT_AMBULATORY_CARE_PROVIDER_SITE_OTHER): Payer: Medicare HMO | Admitting: Podiatry

## 2020-10-21 ENCOUNTER — Ambulatory Visit (INDEPENDENT_AMBULATORY_CARE_PROVIDER_SITE_OTHER): Payer: Medicare HMO

## 2020-10-21 ENCOUNTER — Encounter: Payer: Self-pay | Admitting: Podiatry

## 2020-10-21 DIAGNOSIS — M778 Other enthesopathies, not elsewhere classified: Secondary | ICD-10-CM

## 2020-10-21 DIAGNOSIS — M79672 Pain in left foot: Secondary | ICD-10-CM

## 2020-10-21 DIAGNOSIS — Q828 Other specified congenital malformations of skin: Secondary | ICD-10-CM

## 2020-10-21 DIAGNOSIS — R600 Localized edema: Secondary | ICD-10-CM

## 2020-10-21 MED ORDER — TRIAMCINOLONE ACETONIDE 10 MG/ML IJ SUSP
10.0000 mg | Freq: Once | INTRAMUSCULAR | Status: AC
Start: 2020-10-21 — End: 2020-10-21
  Administered 2020-10-21: 10 mg

## 2020-10-22 NOTE — Progress Notes (Signed)
Subjective:   Patient ID: [name], female   DOB: 70 y.o.   MRN: [id number]   HPI Patient presents with pain on top of her right foot stating that it did very well for around 6 months and is bothersome again and a lesion on the left foot that is been sore and feels like she is walking on a palpable   ROS      Objective:  Physical Exam  Neurovascular status intact with patient who has extensive arthritis in both feet with arthritis also around the first MPJ right but nonpainful with range of motion and no crepitus.  She has quite a bit of discomfort in the dorsum of the midfoot right over left with inflammation fluid of the extensor tendon complex and has lesions of the fourth metatarsal left painful when pressed     Assessment:  Extensor tendinitis right with inflammation with midfoot arthritis along with multiple other arthritic points with a probable porokeratotic lesion left     Plan:  H&P x-rays reviewed sterile prep and injected the extensor complex right 3 mg Kenalog 5 mg Xylocaine instructed on heat ice therapy and debrided lesion left with no iatrogenic bleeding.  Patient will be seen back to recheck is encouraged to call with questions and is going [place] the [place] the [place] and will most likely be seen after that  X-rays indicate that there is some midtarsal joint arthritis right there is extensive arthritis around the first MPJ right with a previous fracture from previous surgery but it is not clinically sore and multiple signs of arthritis of both feet

## 2020-10-26 ENCOUNTER — Other Ambulatory Visit: Payer: Self-pay | Admitting: Podiatry

## 2020-10-26 DIAGNOSIS — M778 Other enthesopathies, not elsewhere classified: Secondary | ICD-10-CM

## 2020-10-28 DIAGNOSIS — M5416 Radiculopathy, lumbar region: Secondary | ICD-10-CM | POA: Diagnosis not present

## 2020-10-29 DIAGNOSIS — M25561 Pain in right knee: Secondary | ICD-10-CM | POA: Diagnosis not present

## 2020-10-29 DIAGNOSIS — M13862 Other specified arthritis, left knee: Secondary | ICD-10-CM | POA: Diagnosis not present

## 2020-11-03 ENCOUNTER — Telehealth: Payer: Self-pay

## 2020-11-03 NOTE — Progress Notes (Signed)
    Chronic Care Management Pharmacy Assistant   Name: JATASIA GUNDRUM  MRN: [id number] DOB: 1950/04/01  Opened in error   Andee Poles, House

## 2020-11-04 ENCOUNTER — Encounter: Payer: Self-pay | Admitting: Family Medicine

## 2020-11-04 ENCOUNTER — Other Ambulatory Visit: Payer: Self-pay

## 2020-11-04 DIAGNOSIS — Z1231 Encounter for screening mammogram for malignant neoplasm of breast: Secondary | ICD-10-CM | POA: Diagnosis not present

## 2020-11-04 DIAGNOSIS — M8589 Other specified disorders of bone density and structure, multiple sites: Secondary | ICD-10-CM

## 2020-11-04 LAB — HM MAMMOGRAPHY

## 2020-11-08 ENCOUNTER — Other Ambulatory Visit: Payer: Self-pay | Admitting: *Deleted

## 2020-11-08 DIAGNOSIS — I10 Essential (primary) hypertension: Secondary | ICD-10-CM | POA: Diagnosis not present

## 2020-11-08 DIAGNOSIS — M85851 Other specified disorders of bone density and structure, right thigh: Secondary | ICD-10-CM | POA: Diagnosis not present

## 2020-11-08 DIAGNOSIS — R7301 Impaired fasting glucose: Secondary | ICD-10-CM

## 2020-11-08 DIAGNOSIS — E785 Hyperlipidemia, unspecified: Secondary | ICD-10-CM

## 2020-11-08 DIAGNOSIS — R790 Abnormal level of blood mineral: Secondary | ICD-10-CM | POA: Diagnosis not present

## 2020-11-08 DIAGNOSIS — M85852 Other specified disorders of bone density and structure, left thigh: Secondary | ICD-10-CM | POA: Diagnosis not present

## 2020-11-08 DIAGNOSIS — Z780 Asymptomatic menopausal state: Secondary | ICD-10-CM | POA: Diagnosis not present

## 2020-11-08 LAB — HM DEXA SCAN

## 2020-11-08 NOTE — Telephone Encounter (Signed)
Please place lab orders as pt is requesting specific labs.  I have responded to the pt already about the appointment questions that she had. Charyl Bigger, CMA

## 2020-11-08 NOTE — Telephone Encounter (Signed)
Labs were ordered

## 2020-11-09 ENCOUNTER — Other Ambulatory Visit: Payer: Self-pay

## 2020-11-09 ENCOUNTER — Ambulatory Visit (INDEPENDENT_AMBULATORY_CARE_PROVIDER_SITE_OTHER): Payer: Medicare HMO | Admitting: Family Medicine

## 2020-11-09 ENCOUNTER — Encounter: Payer: Self-pay | Admitting: Family Medicine

## 2020-11-09 VITALS — BP 134/78 | HR 78 | Ht 60.53 in | Wt 165.0 lb

## 2020-11-09 DIAGNOSIS — R748 Abnormal levels of other serum enzymes: Secondary | ICD-10-CM

## 2020-11-09 DIAGNOSIS — M170 Bilateral primary osteoarthritis of knee: Secondary | ICD-10-CM

## 2020-11-09 DIAGNOSIS — M7918 Myalgia, other site: Secondary | ICD-10-CM

## 2020-11-09 DIAGNOSIS — M5136 Other intervertebral disc degeneration, lumbar region: Secondary | ICD-10-CM

## 2020-11-09 DIAGNOSIS — F901 Attention-deficit hyperactivity disorder, predominantly hyperactive type: Secondary | ICD-10-CM | POA: Diagnosis not present

## 2020-11-09 DIAGNOSIS — J4541 Moderate persistent asthma with (acute) exacerbation: Secondary | ICD-10-CM

## 2020-11-09 DIAGNOSIS — N3281 Overactive bladder: Secondary | ICD-10-CM

## 2020-11-09 DIAGNOSIS — K5909 Other constipation: Secondary | ICD-10-CM

## 2020-11-09 DIAGNOSIS — E785 Hyperlipidemia, unspecified: Secondary | ICD-10-CM | POA: Diagnosis not present

## 2020-11-09 DIAGNOSIS — R7301 Impaired fasting glucose: Secondary | ICD-10-CM

## 2020-11-09 DIAGNOSIS — E611 Iron deficiency: Secondary | ICD-10-CM | POA: Diagnosis not present

## 2020-11-09 DIAGNOSIS — R252 Cramp and spasm: Secondary | ICD-10-CM | POA: Diagnosis not present

## 2020-11-09 DIAGNOSIS — G8929 Other chronic pain: Secondary | ICD-10-CM

## 2020-11-09 LAB — CBC WITH DIFFERENTIAL/PLATELET
Absolute Monocytes: 509 cells/uL (ref 200–950)
Basophils Absolute: 38 cells/uL (ref 0–200)
Basophils Relative: 0.5 %
Eosinophils Absolute: 76 cells/uL (ref 15–500)
Eosinophils Relative: 1 %
HCT: 42.6 % (ref 35.0–45.0)
Hemoglobin: 14.1 g/dL (ref 11.7–15.5)
Lymphs Abs: 2272 cells/uL (ref 850–3900)
MCH: 30.5 pg (ref 27.0–33.0)
MCHC: 33.1 g/dL (ref 32.0–36.0)
MCV: 92.2 fL (ref 80.0–100.0)
MPV: 10.4 fL (ref 7.5–12.5)
Monocytes Relative: 6.7 %
Neutro Abs: 4704 cells/uL (ref 1500–7800)
Neutrophils Relative %: 61.9 %
Platelets: 342 10*3/uL (ref 140–400)
RBC: 4.62 10*6/uL (ref 3.80–5.10)
RDW: 13.3 % (ref 11.0–15.0)
Total Lymphocyte: 29.9 %
WBC: 7.6 10*3/uL (ref 3.8–10.8)

## 2020-11-09 LAB — LIPID PANEL
Cholesterol: 173 mg/dL
HDL: 75 mg/dL
LDL Cholesterol (Calc): 85 mg/dL
Non-HDL Cholesterol (Calc): 98 mg/dL
Total CHOL/HDL Ratio: 2.3 (calc)
Triglycerides: 45 mg/dL

## 2020-11-09 LAB — COMPLETE METABOLIC PANEL WITH GFR
AG Ratio: 2 (calc) (ref 1.0–2.5)
ALT: 37 U/L — ABNORMAL HIGH (ref 6–29)
AST: 24 U/L (ref 10–35)
Albumin: 4.4 g/dL (ref 3.6–5.1)
Alkaline phosphatase (APISO): 69 U/L (ref 37–153)
BUN: 23 mg/dL (ref 7–25)
CO2: 29 mmol/L (ref 20–32)
Calcium: 9.5 mg/dL (ref 8.6–10.4)
Chloride: 104 mmol/L (ref 98–110)
Creat: 0.79 mg/dL (ref 0.60–1.00)
Globulin: 2.2 g/dL (calc) (ref 1.9–3.7)
Glucose, Bld: 114 mg/dL — ABNORMAL HIGH (ref 65–99)
Potassium: 4.8 mmol/L (ref 3.5–5.3)
Sodium: 140 mmol/L (ref 135–146)
Total Bilirubin: 0.7 mg/dL (ref 0.2–1.2)
Total Protein: 6.6 g/dL (ref 6.1–8.1)
eGFR: 80 mL/min/{1.73_m2} (ref >=60)

## 2020-11-09 LAB — HEMOGLOBIN A1C
Hgb A1c MFr Bld: 5.8 %{Hb} — ABNORMAL HIGH
Mean Plasma Glucose: 120 mg/dL
eAG (mmol/L): 6.6 mmol/L

## 2020-11-09 LAB — FERRITIN: Ferritin: 32 ng/mL (ref 16–288)

## 2020-11-09 MED ORDER — AMPHETAMINE-DEXTROAMPHET ER 25 MG PO CP24: 25.0000 mg | ORAL_CAPSULE | ORAL | 0 refills | Status: DC

## 2020-11-09 NOTE — Assessment & Plan Note (Addendum)
Usually takes 2 puffs in the morning of her Symbicort and this works well she rarely uses the evening dose.  Discussed that if she notices an increase in her symptoms to please increase back to twice a day which the typical dosing.

## 2020-11-09 NOTE — Assessment & Plan Note (Signed)
He takes her stool softener twice a day and this seems to be effective to control her symptoms.

## 2020-11-09 NOTE — Patient Instructions (Signed)
Please sent me a note if you need anything while you are trying to get in with your new doc.   Please take care of yourself and enjoy the beach.  It has been an absolute pleasure to take care of you over the last several years.  We will all miss you!!  I will send you notes on you DEXA too.

## 2020-11-09 NOTE — Assessment & Plan Note (Signed)
He has been much better on the atorvastatin she has not had any myalgias with it and has done well.  Repeat LDL looks fantastic.  Lab Results  Component Value Date   CHOL 173 11/08/2020   HDL 75 11/08/2020   LDLCALC 85 11/08/2020   LDLDIRECT 130.0 03/19/2013   TRIG 45 11/08/2020   CHOLHDL 2.3 11/08/2020

## 2020-11-09 NOTE — Progress Notes (Signed)
Established Patient Office Visit  Subjective:  Patient ID: [name], female    DOB: 12-19-1950  Age: 70 y.o. MRN: [id number]  CC:  Chief Complaint  Patient presents with   Follow-up    HPI [name] presents for follow-up.  She is actually moving out of state the [place] to be near [employer].  This is her last visit with [place].  She did go for some labs about a week ago ahead of time.  ADD - Reports symptoms are well controlled on current regime. Denies any problems with insomnia, chest pain, palpitations, or SOB.    Impaired fasting glucose-no increased thirst or urination. No symptoms consistent with hypoglycemia.  Iron deficiency-we did recheck on her recent labs she is still taking her supplement daily and has been tolerating that well she does take 2 stool softeners to help combat constipation.  Past Medical History:  Diagnosis Date   ADD (attention deficit disorder)    Arthritis    "bunion on  right" (08/11/2014)   Chronic bronchitis ([place])    "plenty; but not q yr" (08/11/2014)   Depression    Dyslipidemia    Environmental allergies    GERD (gastroesophageal reflux disease)    Hearing difficulty of left ear    "grew up w/deaf parent; find myself lip reading more recently" (08/11/2014)   Hepatitis    "exposed in college; rec'd gamma globulin; 6 months later S/S (weakness, lethargy, fevers); ; dx'd infectious hepatitis"   History of herpes genitalis    "stress induced reoccurance that shows up on my left middle finger" (08/11/2014)   History of hiatal hernia    History of stomach ulcers    Hypercholesterolemia    Hyperthyroidism 1994   "postpartum only; resolved itself"   Incisional hernia    Internal hemorrhoids    Leg cramping    Osteopenia    Seasonal asthma    Stress incontinence, female    Ulcer     Past Surgical History:  Procedure Laterality Date   CESAREAN SECTION  1994   CHALAZION EXCISION Left ~ 2013   DILATION AND CURETTAGE OF UTERUS  X 2    EYE SURGERY     HERNIA REPAIR  08/11/2014   INCISIONAL HERNIA REPAIR N/A 08/11/2014   Procedure: LAPAROSCOPIC INCISIONAL HERNIA REPAIR;  Surgeon: Coralie Keens, MD;  Location: [facility];  Service: General;  Laterality: N/A;   INCONTINENCE SURGERY  2005   INGUINAL HERNIA REPAIR Left 08/11/2014   INGUINAL HERNIA REPAIR Left 08/11/2014   Procedure: LEFT INGUINAL HERNIA REPAIR;  Surgeon: Coralie Keens, MD;  Location: [facility];  Service: General;  Laterality: Left;   INSERTION OF MESH Left 08/11/2014   Procedure: INSERTION OF MESH TO LEFT GROIN AND ABDOMEN;  Surgeon: Coralie Keens, MD;  Location: [facility];  Service: General;  Laterality: Left;   LAPAROSCOPIC INCISIONAL / UMBILICAL / [place]  08/11/2014   [facility]   NASAL POLYP EXCISION  2014   REPAIR OF PERFORATED ULCER  1985   TONSILLECTOMY     TOTAL SHOULDER ARTHROPLASTY Left 09/21/2017   Procedure: LEFT TOTAL SHOULDER ARTHROPLASTY;  Surgeon: Nicholes Stairs, MD;  Location: [facility];  Service: Orthopedics;  Laterality: Left;    Family History  Problem Relation Age of Onset   Arthritis Mother    Heart disease Mother        CABG @ 95   Diabetes Mother    Osteoporosis Mother    Hyperlipidemia Mother  Arthritis Father    Lymphoma Father 25       chemo   Cancer Father        lymphoma   Lymphoma Paternal Grandmother    Diabetes Maternal Grandmother    Colon cancer Neg Hx    Rectal cancer Neg Hx    Esophageal cancer Neg Hx     Social History   Socioeconomic History   Marital status: Single    Spouse name: Not on file   Number of children: 1   Years of education: 16   Highest education level: Bachelor's degree (e.g., BA, AB, BS)  Occupational History   Occupation: Consulting civil engineer    Comment: Full time; Barista   Occupation: Retired  Tobacco Use   Smoking status: Former    Packs/day: 0.00    Types: Cigarettes   Smokeless tobacco: Never   Tobacco comments:    Social Smoker in her 20's  Vaping  Use   Vaping Use: Never used  Substance and Sexual Activity   Alcohol use: Not Currently    Alcohol/week: 1.0 standard drink    Types: 1 Glasses of wine per week   Drug use: No   Sexual activity: Not Currently    Birth control/protection: Post-menopausal  Other Topics Concern   Not on file  Social History Narrative   Lives alone. She has one daughter. She likes to travel, puzzles and talking on the phone.   Social Determinants of Health   Financial Resource Strain: Low Risk    Difficulty of Paying Living Expenses: Not hard at all  Food Insecurity: No Food Insecurity   Worried About Charity fundraiser in the Last Year: Never true   [place] in the Last Year: Never true  Transportation Needs: No Transportation Needs   Lack of Transportation (Medical): No   Lack of Transportation (Non-Medical): No  Physical Activity: Inactive   Days of Exercise per Week: 0 days   Minutes of Exercise per Session: 0 min  Stress: No Stress Concern Present   Feeling of Stress : Not at all  Social Connections: Moderately Integrated   Frequency of Communication with Friends and Family: More than three times a week   Frequency of Social Gatherings with Friends and Family: More than three times a week   Attends Religious Services: More than 4 times per year   Active Member of [employer] or Organizations: Yes   Attends Music therapist: More than 4 times per year   Marital Status: Divorced  Human resources officer Violence: Not At Risk   Fear of Current or Ex-Partner: No   Emotionally Abused: No   Physically Abused: No   Sexually Abused: No    Outpatient Medications Prior to Visit  Medication Sig Dispense Refill   acetaminophen (TYLENOL) 650 MG CR tablet Take 1,300 mg by mouth See admin instructions. Take 1,300 mg by mouth at bedtime and may also take an additional 1,300 mg one to two times a day as needed for pain     ALBUTEROL IN Inhale into the lungs.     atorvastatin (LIPITOR) 40 MG  tablet Take 1 tablet (40 mg total) by mouth at bedtime. 90 tablet 3   cyclobenzaprine (FLEXERIL) 10 MG tablet Take 1 tablet (10 mg total) by mouth at bedtime as needed (sleep or pain). 30 tablet 0   docusate sodium (COLACE) 100 MG capsule Take 100 mg by mouth 2 (two) times daily.     DULoxetine (CYMBALTA)  30 MG capsule TAKE 2 CAPSULES IN THE MORNING  AND TAKE 1 CAPSULE IN THE EVENING (Patient taking differently: TAKE 2 CAPSULES IN THE MORNING  AND TAKE 1 CAPSULE IN THE EVENING  TAKING IT AFTER MID-DAY) 270 capsule 1   ferrous sulfate 325 (65 FE) MG EC tablet ferrous sulfate 325 mg (65 mg iron) tablet,delayed release     ipratropium (ATROVENT) 0.03 % nasal spray Place 2 sprays into both nostrils every 12 (twelve) hours. As needed.     loratadine (CLARITIN) 10 MG tablet Take 1 tablet (10 mg total) by mouth daily. 90 tablet 3   Multiple Vitamins-Minerals (MULTIVITAMIN ADULT, MINERALS,) TABS      SYMBICORT 160-4.5 MCG/ACT inhaler INHALE 2 PUFFS EVERY DAY (EXPIRES 90 DAYS AFTER OPENING) (Patient taking differently: Sometimes she takes 2-3 puffs.) 2 each 1   Vibegron (GEMTESA) 75 MG TABS Take 1 tablet by mouth daily.     amoxicillin (AMOXIL) 500 MG tablet amoxicillin 500 mg tablet  TAKE 4 TABLETS BY MOUTH 1 TO 2 HOURS BEFORE DENTAL WORK     amphetamine-dextroamphetamine (ADDERALL XR) 25 MG 24 hr capsule Take 1 capsule by mouth every morning. 90 capsule 0   Black Elderberry (SAMBUCUS ELDERBERRY PO)      oxybutynin (DITROPAN-XL) 10 MG 24 hr tablet oxybutynin chloride ER 10 mg tablet,extended release 24 hr  TAKE 1 TABLET BY MOUTH EVERY DAY     No facility-administered medications prior to visit.    Allergies  Allergen Reactions   Aspirin Other (See Comments)    GI Intolerance; History of ulcers; GI BLEED   Bee Venom Swelling    Any insects that bite or stings- swelling at site of sting or cellulitis can develop   Nsaids Other (See Comments)    History of ulcers   Vesicare [Solifenacin] Itching    Bactrim [Sulfamethoxazole-Trimethoprim] Other (See Comments)    Belching, body aches, fever    ROS Review of Systems    Objective:    Physical Exam Constitutional:      Appearance: Normal appearance. She is well-developed.  HENT:     Head: Normocephalic and atraumatic.  Cardiovascular:     Rate and Rhythm: Normal rate and regular rhythm.     Heart sounds: Normal heart sounds.  Pulmonary:     Effort: Pulmonary effort is normal.     Breath sounds: Normal breath sounds.  Skin:    General: Skin is warm and dry.  Neurological:     Mental Status: She is alert and oriented to person, place, and time.  Psychiatric:        Behavior: Behavior normal.    BP 134/78   Pulse 78   Ht 5' 0.53" (1.537 m)   Wt 165 lb (74.8 kg)   LMP 08/30/2008   SpO2 98%   BMI 31.66 kg/m  Wt Readings from Last 3 Encounters:  11/09/20 165 lb (74.8 kg)  10/08/20 166 lb 0.6 oz (75.3 kg)  08/24/20 166 lb (75.3 kg)     Health Maintenance Due  Topic Date Due   COVID-19 Vaccine (5 - Booster for Pfizer series) 10/28/2020    There are no preventive care reminders to display for this patient.  Lab Results  Component Value Date   TSH 1.73 09/25/2018   Lab Results  Component Value Date   WBC 7.6 11/08/2020   HGB 14.1 11/08/2020   HCT 42.6 11/08/2020   MCV 92.2 11/08/2020   PLT 342 11/08/2020   Lab Results  Component Value  Date   NA 140 11/08/2020   K 4.8 11/08/2020   CO2 29 11/08/2020   GLUCOSE 114 (H) 11/08/2020   BUN 23 11/08/2020   CREATININE 0.79 11/08/2020   BILITOT 0.7 11/08/2020   ALKPHOS 104 07/02/2017   AST 24 11/08/2020   ALT 37 (H) 11/08/2020   PROT 6.6 11/08/2020   ALBUMIN 4.3 07/02/2017   CALCIUM 9.5 11/08/2020   ANIONGAP 7 09/22/2017   EGFR 80 11/08/2020   GFR 74.97 03/19/2013   Lab Results  Component Value Date   CHOL 173 11/08/2020   Lab Results  Component Value Date   HDL 75 11/08/2020   Lab Results  Component Value Date   LDLCALC 85 11/08/2020   Lab  Results  Component Value Date   TRIG 45 11/08/2020   Lab Results  Component Value Date   CHOLHDL 2.3 11/08/2020   Lab Results  Component Value Date   HGBA1C 5.8 (H) 11/08/2020      Assessment & Plan:   Problem List Items Addressed This Visit       Respiratory   Asthma    Usually takes 2 puffs in the morning of her Symbicort and this works well she rarely uses the evening dose.  Discussed that if she notices an increase in her symptoms to please increase back to twice a day which the typical dosing.        Digestive   Chronic constipation    He takes her stool softener twice a day and this seems to be effective to control her symptoms.        Endocrine   IFG (impaired fasting glucose)    A1c looks better this time.  Great work and bringing that down.  Lab Results  Component Value Date   HGBA1C 5.8 (H) 11/08/2020           Musculoskeletal and Integument   Primary osteoarthritis of both knees    She was hoping to get the viscosupplementation injections performed before she left but they could not get it approved so she did receive a steroid injection recently.      Degeneration of lumbar intervertebral disc    She has been receiving injections for her back.        Genitourinary   OAB (overactive bladder)    She is followed by urology and they were finally able to get the [facility] covered and she feels like that is been working well.        Other   Leg cramps    Usually drinks tonic water and feels that this is helpful.      Iron deficiency    Ferritin is still on the low end of normal but trending up nicely.  Continue with iron supplementation.      Dyslipidemia    He has been much better on the atorvastatin she has not had any myalgias with it and has done well.  Repeat LDL looks fantastic.  Lab Results  Component Value Date   CHOL 173 11/08/2020   HDL 75 11/08/2020   LDLCALC 85 11/08/2020   LDLDIRECT 130.0 03/19/2013   TRIG 45 11/08/2020    CHOLHDL 2.3 11/08/2020         Chronic musculoskeletal pain    He is doing well on her duloxetine and is happy with her current regimen she has felt a little extra stress recently with the move but otherwise is doing well.      ADD (attention deficit disorder) -  Primary    Happy with her current regimen and feels like it is effective.  We just filled a 90-day supply last month.  They did not have a full 90 in stock.  We will go ahead and send next prescription.      Relevant Medications   amphetamine-dextroamphetamine (ADDERALL XR) 25 MG 24 hr capsule (Start on 01/10/2021)   Other Visit Diagnoses     Elevated liver enzymes           We did go over the lab results together.  Of note, one of her liver enzymes was mildly elevated not in a worrisome range recommended that she have that repeated in about 3 to 4 months after she moves she does take Tylenol daily for pain.  Occasionally she might skip a day.  But is pretty consistent so we discussed maybe minimizing the Tylenol around the time that she gets it rechecked to see if it improves.  Meds ordered this encounter  Medications   amphetamine-dextroamphetamine (ADDERALL XR) 25 MG 24 hr capsule    Sig: Take 1 capsule by mouth every morning.    Dispense:  90 capsule    Refill:  0    Current Outpatient Medications:    acetaminophen (TYLENOL) 650 MG CR tablet, Take 1,300 mg by mouth See admin instructions. Take 1,300 mg by mouth at bedtime and may also take an additional 1,300 mg one to two times a day as needed for pain, Disp: , Rfl:    ALBUTEROL IN, Inhale into the lungs., Disp: , Rfl:    [START ON 01/10/2021] amphetamine-dextroamphetamine (ADDERALL XR) 25 MG 24 hr capsule, Take 1 capsule by mouth every morning., Disp: 90 capsule, Rfl: 0   atorvastatin (LIPITOR) 40 MG tablet, Take 1 tablet (40 mg total) by mouth at bedtime., Disp: 90 tablet, Rfl: 3   cyclobenzaprine (FLEXERIL) 10 MG tablet, Take 1 tablet (10 mg total) by mouth at  bedtime as needed (sleep or pain)., Disp: 30 tablet, Rfl: 0   docusate sodium (COLACE) 100 MG capsule, Take 100 mg by mouth 2 (two) times daily., Disp: , Rfl:    DULoxetine (CYMBALTA) 30 MG capsule, TAKE 2 CAPSULES IN THE MORNING  AND TAKE 1 CAPSULE IN THE EVENING (Patient taking differently: TAKE 2 CAPSULES IN THE MORNING  AND TAKE 1 CAPSULE IN THE EVENING  TAKING IT AFTER MID-DAY), Disp: 270 capsule, Rfl: 1   ferrous sulfate 325 (65 FE) MG EC tablet, ferrous sulfate 325 mg (65 mg iron) tablet,delayed release, Disp: , Rfl:    ipratropium (ATROVENT) 0.03 % nasal spray, Place 2 sprays into both nostrils every 12 (twelve) hours. As needed., Disp: , Rfl:    loratadine (CLARITIN) 10 MG tablet, Take 1 tablet (10 mg total) by mouth daily., Disp: 90 tablet, Rfl: 3   Multiple Vitamins-Minerals (MULTIVITAMIN ADULT, MINERALS,) TABS, , Disp: , Rfl:    SYMBICORT 160-4.5 MCG/ACT inhaler, INHALE 2 PUFFS EVERY DAY (EXPIRES 90 DAYS AFTER OPENING) (Patient taking differently: Sometimes she takes 2-3 puffs.), Disp: 2 each, Rfl: 1   Vibegron (GEMTESA) 75 MG TABS, Take 1 tablet by mouth daily., Disp: , Rfl:     Follow-up: Return if symptoms worsen or fail to improve.   I spent 45 minutes on the day of the encounter to include pre-visit record review, face-to-face time with the patient and post visit ordering of test.   Beatrice Lecher, MD

## 2020-11-09 NOTE — Progress Notes (Signed)
Cholesterol looks fantastic on the atorvastatin continue current regimen.  A1c looks great at 5.8.  Great work and bringing that down in fact that is the best its been in the last 4 years which is awesome!  Your ferritin has been gradually increasing which is fantastic.  Our goal is still to get it above 40.  Continue with your daily supplement.  Hemoglobin is up to 14 which is great.  Your metabolic panel overall looks good except your ALT, a liver enzyme, is just mildly elevated.  Nothing worrisome but I would like to have it rechecked in about 3 or 4 months.  Try to keep Tylenol to less than 3 g/day and you may want to even decrease it even more around the time that you have your liver rechecked.

## 2020-11-09 NOTE — Assessment & Plan Note (Signed)
Happy with her current regimen and feels like it is effective.  We just filled a 90-day supply last month.  They did not have a full 90 in stock.  We will go ahead and send next prescription.

## 2020-11-09 NOTE — Assessment & Plan Note (Signed)
Ferritin is still on the low end of normal but trending up nicely.  Continue with iron supplementation.

## 2020-11-09 NOTE — Assessment & Plan Note (Signed)
Usually drinks tonic water and feels that this is helpful.

## 2020-11-09 NOTE — Assessment & Plan Note (Signed)
She is followed by urology and they were finally able to get the [facility] covered and she feels like that is been working well.

## 2020-11-09 NOTE — Assessment & Plan Note (Signed)
He is doing well on her duloxetine and is happy with her current regimen she has felt a little extra stress recently with the move but otherwise is doing well.

## 2020-11-09 NOTE — Assessment & Plan Note (Signed)
A1c looks better this time.  Great work and bringing that down.  Lab Results  Component Value Date   HGBA1C 5.8 (H) 11/08/2020

## 2020-11-09 NOTE — Assessment & Plan Note (Signed)
She was hoping to get the viscosupplementation injections performed before she left but they could not get it approved so she did receive a steroid injection recently.

## 2020-11-09 NOTE — Assessment & Plan Note (Signed)
She has been receiving injections for her back.

## 2020-11-10 ENCOUNTER — Encounter: Payer: Self-pay | Admitting: Family Medicine

## 2020-11-10 ENCOUNTER — Ambulatory Visit (INDEPENDENT_AMBULATORY_CARE_PROVIDER_SITE_OTHER): Payer: Medicare HMO | Admitting: Pharmacist

## 2020-11-10 DIAGNOSIS — E1169 Type 2 diabetes mellitus with other specified complication: Secondary | ICD-10-CM

## 2020-11-10 DIAGNOSIS — G8929 Other chronic pain: Secondary | ICD-10-CM

## 2020-11-10 DIAGNOSIS — E785 Hyperlipidemia, unspecified: Secondary | ICD-10-CM

## 2020-11-10 DIAGNOSIS — M7918 Myalgia, other site: Secondary | ICD-10-CM

## 2020-11-10 NOTE — Progress Notes (Signed)
Chronic Care Management Pharmacy Note  11/11/2020 Name:  [name] MRN:  [id number] DOB:  Mar 29, 1950  Summary: reviewed medications in depth, addressed HLD, chronic pain.   Recommendations/Changes made from today's visit: None  Plan: patient is moving, f/u to be established at new place of residence  Subjective: [name] is an 70 y.o. year old female who is a primary patient of Metheney, Rene Kocher, MD.  The CCM team was consulted for assistance with disease management and care coordination needs.    Engaged with patient by telephone for initial visit in response to provider referral for pharmacy case management and/or care coordination services.   Consent to Services:  The patient was given information about Chronic Care Management services, agreed to services, and gave verbal consent prior to initiation of services.  Please see initial visit note for detailed documentation.   Patient Care Team: Hali Marry, MD as PCP - General (Family Medicine) Woodroe Mode, MD (Obstetrics and Gynecology) Thornell Sartorius, MD (Otolaryngology) Nicholaus Bloom, MD (Psychiatry) Darius Bump, [facility] as Pharmacist (Pharmacist) Bjorn Loser, MD as Consulting Physician (Urology) Suella Broad, MD as Consulting Physician (Physical Medicine and Rehabilitation) Latanya Maudlin, MD as Consulting Physician (Orthopedic Surgery)   Objective:  Lab Results  Component Value Date   CREATININE 0.79 11/08/2020   CREATININE 0.80 07/22/2020   CREATININE 0.74 10/21/2019    Lab Results  Component Value Date   HGBA1C 5.8 (H) 11/08/2020   Last diabetic Eye exam: No results found for: HMDIABEYEEXA  Last diabetic Foot exam: No results found for: HMDIABFOOTEX      Component Value Date/Time   CHOL 173 11/08/2020 0000   CHOL 190 07/02/2017 1749   TRIG 45 11/08/2020 0000   HDL 75 11/08/2020 0000   HDL 69 07/02/2017 1749   CHOLHDL 2.3 11/08/2020 0000   VLDL 28 11/29/2015 1133    LDLCALC 85 11/08/2020 0000   LDLDIRECT 130.0 03/19/2013 1400    Hepatic Function Latest Ref Rng & Units 11/08/2020 07/22/2020 10/21/2019  Total Protein 6.1 - 8.1 g/dL 6.6 6.7 6.6  Albumin 3.6 - 4.8 g/dL - - -  AST 10 - 35 U/L 24 23 23   ALT 6 - 29 U/L 37(H) 23 21  Alk Phosphatase 39 - 117 IU/L - - -  Total Bilirubin 0.2 - 1.2 mg/dL 0.7 0.6 0.6  Bilirubin, Direct 0.0 - 0.3 mg/dL - - -    Lab Results  Component Value Date/Time   TSH 1.73 09/25/2018 12:28 PM   TSH 0.84 07/18/2018 02:45 PM    CBC Latest Ref Rng & Units 11/08/2020 07/22/2020 10/21/2019  WBC 3.8 - 10.8 Thousand/uL 7.6 5.9 6.2  Hemoglobin 11.7 - 15.5 g/dL 14.1 14.7 10.4(L)  Hematocrit 35.0 - 45.0 % 42.6 43.0 34.2(L)  Platelets 140 - 400 Thousand/uL 342 355 399    Lab Results  Component Value Date/Time   VD25OH 30.8 08/04/2016 06:24 PM   VD25OH 26 (L) 11/29/2015 11:33 AM     Social History   Tobacco Use  Smoking Status Former   Packs/day: 0.00   Types: Cigarettes  Smokeless Tobacco Never  Tobacco Comments   Social Smoker in her 20's   BP Readings from Last 3 Encounters:  11/09/20 134/78  10/08/20 136/80  08/24/20 127/72   Pulse Readings from Last 3 Encounters:  11/09/20 78  10/08/20 86  08/24/20 90   Wt Readings from Last 3 Encounters:  11/09/20 165 lb (74.8 kg)  10/08/20 166 lb  0.6 oz (75.3 kg)  08/24/20 166 lb (75.3 kg)    Assessment: Review of patient past medical history, allergies, medications, health status, including review of consultants reports, laboratory and other test data, was performed as part of comprehensive evaluation and provision of chronic care management services.   SDOH:  (Social Determinants of Health) assessments and interventions performed:    CCM Care Plan  Allergies  Allergen Reactions   Aspirin Other (See Comments)    GI Intolerance; History of ulcers; GI BLEED   Bee Venom Swelling    Any insects that bite or stings- swelling at site of sting or cellulitis can  develop   Nsaids Other (See Comments)    History of ulcers   Vesicare [Solifenacin] Itching   Bactrim [Sulfamethoxazole-Trimethoprim] Other (See Comments)    Belching, body aches, fever    Medications Reviewed Today     Reviewed by Darius Bump, [facility] (Pharmacist) on 11/10/20 at 1151  Med List Status: <None>   Medication Order Taking? Sig Documenting Provider Last Dose Status Informant  acetaminophen (TYLENOL) 650 MG CR tablet [id number] Yes Take 1,300 mg by mouth See admin instructions. Take 1,300 mg by mouth at bedtime and may also take an additional 1,300 mg one to two times a day as needed for pain [provider] Taking Active Self           Med Note Darius Bump   Fri Jul 23, 2020 11:16 AM) Taking mostly at bedtime, sometimes 1-2 other daily times PRN  ALBUTEROL IN [id number] Yes Inhale into the lungs. [provider] Taking Active   amphetamine-dextroamphetamine (ADDERALL XR) 25 MG 24 hr capsule [id number] Yes Take 1 capsule by mouth every morning. Hali Marry, MD Taking Active   atorvastatin (LIPITOR) 40 MG tablet [id number] Yes Take 1 tablet (40 mg total) by mouth at bedtime. Hali Marry, MD Taking Active   cyclobenzaprine (FLEXERIL) 10 MG tablet [id number] Yes Take 1 tablet (10 mg total) by mouth at bedtime as needed (sleep or pain). Hali Marry, MD Taking Active   docusate sodium (COLACE) 100 MG capsule [id number] Yes Take 100 mg by mouth 2 (two) times daily. [provider] Taking Active Self  DULoxetine (CYMBALTA) 30 MG capsule [id number] Yes TAKE 2 CAPSULES IN THE MORNING  AND TAKE 1 CAPSULE IN THE EVENING  Patient taking differently: TAKE 2 CAPSULES IN THE MORNING  AND TAKE 1 CAPSULE IN THE EVENING  TAKING IT AFTER MID-DAY   Hali Marry, MD Taking Active   ferrous sulfate 325 (65 FE) MG EC tablet [id number] Yes ferrous sulfate 325 mg (65 mg iron) tablet,delayed release [provider] Taking Active    ipratropium (ATROVENT) 0.03 % nasal spray [id number] Yes Place 2 sprays into both nostrils every 12 (twelve) hours. As needed. [provider] Taking Active   loratadine (CLARITIN) 10 MG tablet [id number] Yes Take 1 tablet (10 mg total) by mouth daily. [name], MD Taking Active Self  Multiple Vitamins-Minerals (MULTIVITAMIN ADULT, Norlene Campbell,) TABS [id number] Yes  [provider] Taking Active   SYMBICORT 160-4.5 MCG/ACT inhaler [id number] Yes INHALE 2 PUFFS EVERY DAY (EXPIRES 90 DAYS AFTER OPENING)  Patient taking differently: Sometimes she takes 2-3 puffs.   Hali Marry, MD Taking Active   Vibegron [facility]) 75 MG TABS [id number] Yes Take 1 tablet by mouth daily. [provider] Taking Active             Patient Active Problem List  Diagnosis Date Noted   Left inguinal hernia 08/26/2020   Peripheral edema 07/22/2020   Iron deficiency 04/01/2020   Primary insomnia 08/22/2019   Obsessional thoughts 08/22/2019   Chronic constipation 07/07/2019   Leg cramps 07/07/2019   Dyspepsia 05/15/2019   Degeneration of lumbar intervertebral disc 11/28/2018   Non-seasonal allergic rhinitis 10/03/2018   OAB (overactive bladder) 07/18/2018   Hair loss 07/18/2018   Localized, primary osteoarthritis of shoulder region 04/18/2018   Low back pain 03/06/2018   Chronic musculoskeletal pain 11/13/2017   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   IFG (impaired fasting glucose) 08/03/2017   Primary osteoarthritis of both knees 07/29/2017   Sinusitis, chronic    Asthma 01/08/2012   Dyslipidemia    ADD (attention deficit disorder)    Depression    Osteopenia    Incisional hernia    Stress incontinence, female     Immunization History  Administered Date(s) Administered   Fluad Quad(high Dose 65+) 10/04/2018, 10/22/2019, 10/08/2020   Influenza Inj Mdck Quad With Preservative 10/24/2016   Influenza Split 12/01/2010   Influenza, High Dose Seasonal PF  10/02/2017   Influenza,inj,Quad PF,6+ Mos 11/29/2015   Influenza-Unspecified 09/30/2012, 10/08/2013, 10/31/2014   Moderna Sars-Covid-2 Vaccination 06/27/2020   PFIZER(Purple Top)SARS-COV-2 Vaccination 02/25/2019, 03/27/2019, 11/28/2019   Pneumococcal Conjugate-13 11/29/2015   Pneumococcal Polysaccharide-23 05/28/2017   Td 10/24/2019   Tdap 12/30/2009, 10/24/2019   Zoster Recombinat (Shingrix) 11/13/2017, 10/09/2018   Zoster, Live 09/30/2012    Conditions to be addressed/monitored: HLD and chronic pain  Care Plan : Medication Management  Updates made by Darius Bump, [place] since 11/11/2020 12:00 AM     Problem: HLD, chronic pain      Long-Range Goal: Disease Progression Prevention   Start Date: 07/23/2020  Recent Progress: On track  Priority: High  Note:   Current Barriers:  None at present  Pharmacist Clinical Goal(s):  Over the next 180 days, patient will maintain control of chronic conditions as evidenced by medication fill history, vital signs, and lab values  through collaboration with PharmD and provider.   Interventions: 1:1 collaboration with Hali Marry, MD regarding development and update of comprehensive plan of care as evidenced by provider attestation and co-signature Inter-disciplinary care team collaboration (see longitudinal plan of care) Comprehensive medication review performed; medication list updated in electronic medical record  Hyperlipidemia:  Uncontrolled; current treatment:atorvastatin 4m;   Medications previously tried: pravastatin, simvastatin   Recommended continue current regimen, recheck updated lipid panel for 2022.   and  Chronic Pain  Controlled; current treatment: tylenol 13044m duloxetine 6039mM, 5m29m;   Recommended continue current regimen  Patient Goals/Self-Care Activities Over the next 180 days, patient will:  take medications as prescribed  Follow Up Plan: Patient is moving out of state, follow up to be  established with new providers in her new state of residence      Medication Assistance:  patient states she obtained patience assistance for [facility] for receiving supply mailed to her house.  [id number]: [phone]: [phone] [place]: [phone]: [phone] Up:  Patient agrees to Care Plan and Follow-up.  Plan: No further follow up required: Patient is moving out  of state.  Larinda Buttery, PharmD Clinical Pharmacist [facility] [phone]

## 2020-11-11 DIAGNOSIS — M5416 Radiculopathy, lumbar region: Secondary | ICD-10-CM | POA: Diagnosis not present

## 2020-11-11 NOTE — Patient Instructions (Signed)
Best wishes, Ms. Fantini!  Visit Information  PATIENT GOALS:  Goals Addressed             This Visit's Progress    Medication Management        Patient Goals/Self-Care Activities Over the next 180 days, patient will:  take medications as prescribed  Follow Up Plan: No further follow up required: Patient is moving out of state.        Patient verbalizes understanding of instructions provided today and agrees to view in [place].   No further follow up required: Patient is moving out of state  [employer]

## 2020-11-16 ENCOUNTER — Telehealth: Payer: Self-pay | Admitting: Family Medicine

## 2020-11-16 DIAGNOSIS — M8589 Other specified disorders of bone density and structure, multiple sites: Secondary | ICD-10-CM

## 2020-11-16 DIAGNOSIS — F901 Attention-deficit hyperactivity disorder, predominantly hyperactive type: Secondary | ICD-10-CM

## 2020-11-16 NOTE — Telephone Encounter (Signed)
Call patient: Bone density test shows mildly thin bones called osteopenia.  I do recommend a follow-up in 2-3 years.   The current recommendation for osteopenia (mildly thin bones) treatment includes:   #1 calcium-total of 1200 mg of calcium daily.  If you eat a very calcium rich diet you may be able to obtain that without a supplement.  If not, then I recommend calcium 500 mg twice a day.  There are several products over-the-counter such as Caltrate D and Viactiv chews which are great options that contain calcium and vitamin D. #2 vitamin D-recommend 800 international units daily. #3 exercise-recommend 30 minutes of weightbearing exercise 3 days a week.  Resistance training ,such as doing bands and light weights, can be particularly helpful.

## 2020-11-16 NOTE — Telephone Encounter (Signed)
angela do we still have her DEXA. I think I put into scan.

## 2020-11-16 NOTE — Telephone Encounter (Signed)
Pt would like to know how this DEXA scan compares to her previous DEXAs from all the way back to 2012.   She would like ot know if the osteopenia is worsening or stable.  Pt states that she is taking MVI with 300mg  of calcium, 8mg  of iron and D3 1000IU.  She also has another tablet that she previously took that has 900mg  calcium in it, so she will consider going back on this.  Charyl Bigger, CMA

## 2020-11-18 NOTE — Telephone Encounter (Signed)
I do not have a Dexa scan for [name].

## 2020-11-19 DIAGNOSIS — M19011 Primary osteoarthritis, right shoulder: Secondary | ICD-10-CM | POA: Diagnosis not present

## 2020-11-23 NOTE — Telephone Encounter (Signed)
There has been a gradual decline. Remember 0 is the norm, so anything below that is thinner than normal.  The good news it no major change from 2021 in the hips. Slight decline in the lumbar spine.    T score now:-1.8 2021: -1.9 2019: -1.6 2016 -1.4  I don't have anything back to 2012 on the system.    I hope your move went really well! I hope you are doing OK!!!! Please take care of yourself.   Dr. Jerilynn Mages

## 2020-11-24 NOTE — Telephone Encounter (Signed)
Attempted to contact pt.  However, VM box is full and unable to accept messages.  Charyl Bigger, CMA

## 2020-11-25 MED ORDER — AMPHETAMINE-DEXTROAMPHET ER 25 MG PO CP24: 25.0000 mg | ORAL_CAPSULE | ORAL | 0 refills | Status: DC

## 2020-11-25 NOTE — Addendum Note (Signed)
Addended by: Beatrice Lecher D on: 11/25/2020 03:16 PM   Modules accepted: Orders

## 2020-11-25 NOTE — Telephone Encounter (Signed)
Meds ordered this encounter  Medications   amphetamine-dextroamphetamine (ADDERALL XR) 25 MG 24 hr capsule    Sig: Take 1 capsule by mouth every morning.    Dispense:  90 capsule    Refill:  0

## 2020-11-29 DIAGNOSIS — E785 Hyperlipidemia, unspecified: Secondary | ICD-10-CM

## 2020-11-29 DIAGNOSIS — E1169 Type 2 diabetes mellitus with other specified complication: Secondary | ICD-10-CM | POA: Diagnosis not present

## 2021-01-26 ENCOUNTER — Encounter: Payer: Self-pay | Admitting: Gastroenterology

## 2021-01-26 ENCOUNTER — Other Ambulatory Visit: Payer: Self-pay

## 2021-01-26 DIAGNOSIS — F901 Attention-deficit hyperactivity disorder, predominantly hyperactive type: Secondary | ICD-10-CM

## 2021-01-26 MED ORDER — AMPHETAMINE-DEXTROAMPHET ER 25 MG PO CP24: 25.0000 mg | ORAL_CAPSULE | ORAL | 0 refills | Status: DC

## 2021-01-26 NOTE — Telephone Encounter (Signed)
Patient called stating the Adderall XR 25 mg was sent to [facility] in October however patient stated [facility] informed her the prescription expires after 30 days. Patient requesting for new prescription to be resent to [facility]. Patient stated she has been out of her medication for 2 weeks.

## 2021-02-01 ENCOUNTER — Other Ambulatory Visit: Payer: Self-pay

## 2021-02-01 DIAGNOSIS — F901 Attention-deficit hyperactivity disorder, predominantly hyperactive type: Secondary | ICD-10-CM

## 2021-02-01 MED ORDER — AMPHETAMINE-DEXTROAMPHET ER 25 MG PO CP24: 25.0000 mg | ORAL_CAPSULE | ORAL | 0 refills | Status: DC

## 2021-02-01 NOTE — Telephone Encounter (Signed)
Received call from [facility] stating they are unable to fill patient's Adderall for a 90 day supply. Pharmacy requesting to have prescription sent as a 30 day supply with refills. Forward to Dr. Madilyn Fireman for review.

## 2021-02-28 ENCOUNTER — Encounter: Payer: Self-pay | Admitting: Family Medicine

## 2021-03-02 ENCOUNTER — Encounter: Payer: Self-pay | Admitting: Family Medicine

## 2021-03-02 MED ORDER — METHYLPHENIDATE HCL ER 20 MG PO TBCR: 20.0000 mg | EXTENDED_RELEASE_TABLET | Freq: Every day | ORAL | 0 refills | Status: DC

## 2021-03-08 ENCOUNTER — Telehealth: Payer: Self-pay

## 2021-03-08 NOTE — Telephone Encounter (Signed)
Received call from Ulice Dash at [facility] regarding patient's Adderall. Ulice Dash stated they are unable to fill patient's Adderall for a 90 day supply in the state of [place]. Ulice Dash stated they have filled patients Adderall for a 30 day supply only and just wanted this noted in patient's chart.

## 2021-04-18 ENCOUNTER — Other Ambulatory Visit: Payer: Self-pay | Admitting: *Deleted

## 2021-04-18 MED ORDER — METHYLPHENIDATE HCL ER 20 MG PO TBCR: 20.0000 mg | EXTENDED_RELEASE_TABLET | Freq: Every day | ORAL | 0 refills | Status: AC

## 2021-04-18 NOTE — Telephone Encounter (Signed)
90-day prescription sent.  Please notify patient that she will need to find a new see PCP in her local area we will no longer be able to fill these medications as it is already been 6 months since she was last seen in our office. ?

## 2021-04-18 NOTE — Telephone Encounter (Signed)
Patient advised of message and verbalized understanding.  

## 2021-04-26 DIAGNOSIS — M170 Bilateral primary osteoarthritis of knee: Secondary | ICD-10-CM | POA: Diagnosis not present

## 2021-04-26 DIAGNOSIS — Z6832 Body mass index (BMI) 32.0-32.9, adult: Secondary | ICD-10-CM | POA: Diagnosis not present

## 2021-04-26 DIAGNOSIS — M19071 Primary osteoarthritis, right ankle and foot: Secondary | ICD-10-CM | POA: Diagnosis not present

## 2021-04-26 DIAGNOSIS — M79671 Pain in right foot: Secondary | ICD-10-CM | POA: Diagnosis not present

## 2021-04-26 DIAGNOSIS — S93602A Unspecified sprain of left foot, initial encounter: Secondary | ICD-10-CM | POA: Diagnosis not present

## 2021-04-26 DIAGNOSIS — M2021 Hallux rigidus, right foot: Secondary | ICD-10-CM | POA: Diagnosis not present

## 2021-04-26 DIAGNOSIS — M2012 Hallux valgus (acquired), left foot: Secondary | ICD-10-CM | POA: Diagnosis not present

## 2021-04-26 DIAGNOSIS — S93622A Sprain of tarsometatarsal ligament of left foot, initial encounter: Secondary | ICD-10-CM | POA: Diagnosis not present

## 2021-05-06 DIAGNOSIS — Z6833 Body mass index (BMI) 33.0-33.9, adult: Secondary | ICD-10-CM | POA: Diagnosis not present

## 2021-05-06 DIAGNOSIS — Z1389 Encounter for screening for other disorder: Secondary | ICD-10-CM | POA: Diagnosis not present

## 2021-05-06 DIAGNOSIS — E782 Mixed hyperlipidemia: Secondary | ICD-10-CM | POA: Diagnosis not present

## 2021-05-06 DIAGNOSIS — N3946 Mixed incontinence: Secondary | ICD-10-CM | POA: Diagnosis not present

## 2021-05-06 DIAGNOSIS — J45909 Unspecified asthma, uncomplicated: Secondary | ICD-10-CM | POA: Diagnosis not present

## 2021-05-06 DIAGNOSIS — R350 Frequency of micturition: Secondary | ICD-10-CM | POA: Diagnosis not present

## 2021-05-06 DIAGNOSIS — E668 Other obesity: Secondary | ICD-10-CM | POA: Diagnosis not present

## 2021-05-06 DIAGNOSIS — R3915 Urgency of urination: Secondary | ICD-10-CM | POA: Diagnosis not present

## 2021-05-06 DIAGNOSIS — D6489 Other specified anemias: Secondary | ICD-10-CM | POA: Diagnosis not present

## 2021-05-06 DIAGNOSIS — R7309 Other abnormal glucose: Secondary | ICD-10-CM | POA: Diagnosis not present

## 2021-05-06 DIAGNOSIS — Z1159 Encounter for screening for other viral diseases: Secondary | ICD-10-CM | POA: Diagnosis not present

## 2021-05-06 DIAGNOSIS — R7303 Prediabetes: Secondary | ICD-10-CM | POA: Diagnosis not present

## 2021-05-12 DIAGNOSIS — I10 Essential (primary) hypertension: Secondary | ICD-10-CM | POA: Diagnosis not present

## 2021-05-12 DIAGNOSIS — F988 Other specified behavioral and emotional disorders with onset usually occurring in childhood and adolescence: Secondary | ICD-10-CM | POA: Diagnosis not present

## 2021-05-12 DIAGNOSIS — Z1159 Encounter for screening for other viral diseases: Secondary | ICD-10-CM | POA: Diagnosis not present

## 2021-05-12 DIAGNOSIS — E782 Mixed hyperlipidemia: Secondary | ICD-10-CM | POA: Diagnosis not present

## 2021-05-12 DIAGNOSIS — E611 Iron deficiency: Secondary | ICD-10-CM | POA: Diagnosis not present

## 2021-05-12 DIAGNOSIS — N3281 Overactive bladder: Secondary | ICD-10-CM | POA: Diagnosis not present

## 2021-05-12 DIAGNOSIS — E7800 Pure hypercholesterolemia, unspecified: Secondary | ICD-10-CM | POA: Diagnosis not present

## 2021-05-12 DIAGNOSIS — Z5181 Encounter for therapeutic drug level monitoring: Secondary | ICD-10-CM | POA: Diagnosis not present

## 2021-05-12 DIAGNOSIS — Z13818 Encounter for screening for other digestive system disorders: Secondary | ICD-10-CM | POA: Diagnosis not present

## 2021-05-12 DIAGNOSIS — M1990 Unspecified osteoarthritis, unspecified site: Secondary | ICD-10-CM | POA: Diagnosis not present

## 2021-05-12 DIAGNOSIS — R350 Frequency of micturition: Secondary | ICD-10-CM | POA: Diagnosis not present

## 2021-05-12 DIAGNOSIS — K279 Peptic ulcer, site unspecified, unspecified as acute or chronic, without hemorrhage or perforation: Secondary | ICD-10-CM | POA: Diagnosis not present

## 2021-05-12 DIAGNOSIS — R7309 Other abnormal glucose: Secondary | ICD-10-CM | POA: Diagnosis not present

## 2021-05-12 DIAGNOSIS — D649 Anemia, unspecified: Secondary | ICD-10-CM | POA: Diagnosis not present

## 2021-05-19 DIAGNOSIS — R350 Frequency of micturition: Secondary | ICD-10-CM | POA: Diagnosis not present

## 2021-05-19 DIAGNOSIS — M79671 Pain in right foot: Secondary | ICD-10-CM | POA: Diagnosis not present

## 2021-05-19 DIAGNOSIS — R3915 Urgency of urination: Secondary | ICD-10-CM | POA: Diagnosis not present

## 2021-05-19 DIAGNOSIS — S93622D Sprain of tarsometatarsal ligament of left foot, subsequent encounter: Secondary | ICD-10-CM | POA: Diagnosis not present

## 2021-05-19 DIAGNOSIS — M2021 Hallux rigidus, right foot: Secondary | ICD-10-CM | POA: Diagnosis not present

## 2021-05-19 DIAGNOSIS — M19071 Primary osteoarthritis, right ankle and foot: Secondary | ICD-10-CM | POA: Diagnosis not present

## 2021-05-19 DIAGNOSIS — N281 Cyst of kidney, acquired: Secondary | ICD-10-CM | POA: Diagnosis not present

## 2021-05-23 DIAGNOSIS — R269 Unspecified abnormalities of gait and mobility: Secondary | ICD-10-CM | POA: Diagnosis not present

## 2021-05-24 DIAGNOSIS — E668 Other obesity: Secondary | ICD-10-CM | POA: Diagnosis not present

## 2021-05-24 DIAGNOSIS — E782 Mixed hyperlipidemia: Secondary | ICD-10-CM | POA: Diagnosis not present

## 2021-05-24 DIAGNOSIS — R7309 Other abnormal glucose: Secondary | ICD-10-CM | POA: Diagnosis not present

## 2021-05-24 DIAGNOSIS — R251 Tremor, unspecified: Secondary | ICD-10-CM | POA: Diagnosis not present

## 2021-05-24 DIAGNOSIS — Z5181 Encounter for therapeutic drug level monitoring: Secondary | ICD-10-CM | POA: Diagnosis not present

## 2021-05-24 DIAGNOSIS — Z6833 Body mass index (BMI) 33.0-33.9, adult: Secondary | ICD-10-CM | POA: Diagnosis not present

## 2021-05-25 DIAGNOSIS — Z5181 Encounter for therapeutic drug level monitoring: Secondary | ICD-10-CM | POA: Diagnosis not present

## 2021-05-26 DIAGNOSIS — R269 Unspecified abnormalities of gait and mobility: Secondary | ICD-10-CM | POA: Diagnosis not present

## 2021-05-31 DIAGNOSIS — R269 Unspecified abnormalities of gait and mobility: Secondary | ICD-10-CM | POA: Diagnosis not present

## 2021-06-02 DIAGNOSIS — R269 Unspecified abnormalities of gait and mobility: Secondary | ICD-10-CM | POA: Diagnosis not present

## 2021-06-07 DIAGNOSIS — Z6833 Body mass index (BMI) 33.0-33.9, adult: Secondary | ICD-10-CM | POA: Diagnosis not present

## 2021-06-07 DIAGNOSIS — S93602A Unspecified sprain of left foot, initial encounter: Secondary | ICD-10-CM | POA: Diagnosis not present

## 2021-06-07 DIAGNOSIS — M170 Bilateral primary osteoarthritis of knee: Secondary | ICD-10-CM | POA: Diagnosis not present

## 2021-06-07 DIAGNOSIS — M2012 Hallux valgus (acquired), left foot: Secondary | ICD-10-CM | POA: Diagnosis not present

## 2021-06-08 DIAGNOSIS — R350 Frequency of micturition: Secondary | ICD-10-CM | POA: Diagnosis not present

## 2021-06-08 DIAGNOSIS — R3915 Urgency of urination: Secondary | ICD-10-CM | POA: Diagnosis not present

## 2021-06-10 DIAGNOSIS — R269 Unspecified abnormalities of gait and mobility: Secondary | ICD-10-CM | POA: Diagnosis not present

## 2021-06-14 DIAGNOSIS — M2012 Hallux valgus (acquired), left foot: Secondary | ICD-10-CM | POA: Diagnosis not present

## 2021-06-14 DIAGNOSIS — S93602A Unspecified sprain of left foot, initial encounter: Secondary | ICD-10-CM | POA: Diagnosis not present

## 2021-06-14 DIAGNOSIS — M170 Bilateral primary osteoarthritis of knee: Secondary | ICD-10-CM | POA: Diagnosis not present

## 2021-06-14 DIAGNOSIS — R269 Unspecified abnormalities of gait and mobility: Secondary | ICD-10-CM | POA: Diagnosis not present

## 2021-06-14 DIAGNOSIS — M1712 Unilateral primary osteoarthritis, left knee: Secondary | ICD-10-CM | POA: Diagnosis not present

## 2021-06-17 DIAGNOSIS — R269 Unspecified abnormalities of gait and mobility: Secondary | ICD-10-CM | POA: Diagnosis not present

## 2021-06-21 DIAGNOSIS — R269 Unspecified abnormalities of gait and mobility: Secondary | ICD-10-CM | POA: Diagnosis not present

## 2021-06-21 DIAGNOSIS — M170 Bilateral primary osteoarthritis of knee: Secondary | ICD-10-CM | POA: Diagnosis not present

## 2021-06-23 DIAGNOSIS — R269 Unspecified abnormalities of gait and mobility: Secondary | ICD-10-CM | POA: Diagnosis not present

## 2021-07-01 DIAGNOSIS — R269 Unspecified abnormalities of gait and mobility: Secondary | ICD-10-CM | POA: Diagnosis not present

## 2021-07-01 DIAGNOSIS — M170 Bilateral primary osteoarthritis of knee: Secondary | ICD-10-CM | POA: Diagnosis not present

## 2021-07-07 DIAGNOSIS — R269 Unspecified abnormalities of gait and mobility: Secondary | ICD-10-CM | POA: Diagnosis not present

## 2021-08-09 DIAGNOSIS — Z0000 Encounter for general adult medical examination without abnormal findings: Secondary | ICD-10-CM | POA: Diagnosis not present

## 2021-08-09 DIAGNOSIS — Z1211 Encounter for screening for malignant neoplasm of colon: Secondary | ICD-10-CM | POA: Diagnosis not present

## 2021-08-09 DIAGNOSIS — D508 Other iron deficiency anemias: Secondary | ICD-10-CM | POA: Diagnosis not present

## 2021-08-09 DIAGNOSIS — R5383 Other fatigue: Secondary | ICD-10-CM | POA: Diagnosis not present

## 2021-08-09 DIAGNOSIS — Z1331 Encounter for screening for depression: Secondary | ICD-10-CM | POA: Diagnosis not present

## 2021-08-09 DIAGNOSIS — Z1231 Encounter for screening mammogram for malignant neoplasm of breast: Secondary | ICD-10-CM | POA: Diagnosis not present

## 2021-08-09 DIAGNOSIS — Z1389 Encounter for screening for other disorder: Secondary | ICD-10-CM | POA: Diagnosis not present

## 2021-08-09 DIAGNOSIS — F909 Attention-deficit hyperactivity disorder, unspecified type: Secondary | ICD-10-CM | POA: Diagnosis not present

## 2021-08-09 DIAGNOSIS — R296 Repeated falls: Secondary | ICD-10-CM | POA: Diagnosis not present

## 2021-08-16 DIAGNOSIS — Z1389 Encounter for screening for other disorder: Secondary | ICD-10-CM | POA: Diagnosis not present

## 2021-08-16 DIAGNOSIS — H1031 Unspecified acute conjunctivitis, right eye: Secondary | ICD-10-CM | POA: Diagnosis not present

## 2021-08-16 DIAGNOSIS — Z6834 Body mass index (BMI) 34.0-34.9, adult: Secondary | ICD-10-CM | POA: Diagnosis not present

## 2021-09-21 DIAGNOSIS — S93602A Unspecified sprain of left foot, initial encounter: Secondary | ICD-10-CM | POA: Diagnosis not present

## 2021-09-21 DIAGNOSIS — M19011 Primary osteoarthritis, right shoulder: Secondary | ICD-10-CM | POA: Diagnosis not present

## 2021-09-21 DIAGNOSIS — M1712 Unilateral primary osteoarthritis, left knee: Secondary | ICD-10-CM | POA: Diagnosis not present

## 2021-09-21 DIAGNOSIS — M2012 Hallux valgus (acquired), left foot: Secondary | ICD-10-CM | POA: Diagnosis not present

## 2021-09-21 DIAGNOSIS — M170 Bilateral primary osteoarthritis of knee: Secondary | ICD-10-CM | POA: Diagnosis not present

## 2021-09-28 ENCOUNTER — Telehealth: Payer: Self-pay | Admitting: Family Medicine

## 2021-09-28 NOTE — Telephone Encounter (Signed)
Pt called because she thought she had an appt today. She has moved to [place]. She wanted me to let you all know she misses you all terribly.

## 2021-10-26 DIAGNOSIS — R296 Repeated falls: Secondary | ICD-10-CM | POA: Diagnosis not present

## 2021-10-26 DIAGNOSIS — R269 Unspecified abnormalities of gait and mobility: Secondary | ICD-10-CM | POA: Diagnosis not present
